# Patient Record
Sex: Male | Born: 1980 | Race: White | Hispanic: No | Marital: Single
Health system: Southern US, Community
[De-identification: ages and names within clinical notes are randomized; demographics above are authoritative.]

## PROBLEM LIST (undated history)

## (undated) DIAGNOSIS — E54 Ascorbic acid deficiency: Secondary | ICD-10-CM

## (undated) DIAGNOSIS — E039 Hypothyroidism, unspecified: Secondary | ICD-10-CM

## (undated) DIAGNOSIS — M6281 Muscle weakness (generalized): Secondary | ICD-10-CM

## (undated) DIAGNOSIS — I272 Pulmonary hypertension, unspecified: Secondary | ICD-10-CM

## (undated) DIAGNOSIS — F411 Generalized anxiety disorder: Secondary | ICD-10-CM

## (undated) DIAGNOSIS — E669 Obesity, unspecified: Secondary | ICD-10-CM

## (undated) DIAGNOSIS — D649 Anemia, unspecified: Secondary | ICD-10-CM

## (undated) DIAGNOSIS — G4730 Sleep apnea, unspecified: Secondary | ICD-10-CM

## (undated) DIAGNOSIS — F32A Depression, unspecified: Secondary | ICD-10-CM

## (undated) DIAGNOSIS — Z992 Dependence on renal dialysis: Secondary | ICD-10-CM

## (undated) DIAGNOSIS — I10 Essential (primary) hypertension: Secondary | ICD-10-CM

## (undated) DIAGNOSIS — I5030 Unspecified diastolic (congestive) heart failure: Secondary | ICD-10-CM

## (undated) DIAGNOSIS — J9691 Respiratory failure, unspecified with hypoxia: Secondary | ICD-10-CM

## (undated) DIAGNOSIS — J90 Pleural effusion, not elsewhere classified: Secondary | ICD-10-CM

## (undated) DIAGNOSIS — I890 Lymphedema, not elsewhere classified: Secondary | ICD-10-CM

## (undated) DIAGNOSIS — N049 Nephrotic syndrome with unspecified morphologic changes: Secondary | ICD-10-CM

## (undated) DIAGNOSIS — F329 Major depressive disorder, single episode, unspecified: Secondary | ICD-10-CM

## (undated) DIAGNOSIS — N189 Chronic kidney disease, unspecified: Secondary | ICD-10-CM

## (undated) DIAGNOSIS — E785 Hyperlipidemia, unspecified: Secondary | ICD-10-CM

## (undated) HISTORY — PX: UPPER GI ENDOSCOPY: SHX6162

## (undated) HISTORY — PX: CARDIAC CATHETERIZATION: SHX172

---

## 2014-06-04 DIAGNOSIS — R600 Localized edema: Secondary | ICD-10-CM | POA: Insufficient documentation

## 2017-03-30 ENCOUNTER — Emergency Department: Payer: Medicaid Other

## 2017-03-30 ENCOUNTER — Inpatient Hospital Stay
Admission: EM | Admit: 2017-03-30 | Discharge: 2017-04-16 | DRG: 673 | Disposition: A | Discharge status: Other Acute Inpatient Hospital | Payer: Medicaid Other | Attending: Internal Medicine | Admitting: Internal Medicine

## 2017-03-30 ENCOUNTER — Inpatient Hospital Stay: Payer: Medicaid Other

## 2017-03-30 ENCOUNTER — Encounter: Payer: Self-pay | Admitting: Intensive Care

## 2017-03-30 DIAGNOSIS — Z993 Dependence on wheelchair: Secondary | ICD-10-CM

## 2017-03-30 DIAGNOSIS — N2581 Secondary hyperparathyroidism of renal origin: Secondary | ICD-10-CM | POA: Diagnosis present

## 2017-03-30 DIAGNOSIS — Z7982 Long term (current) use of aspirin: Secondary | ICD-10-CM

## 2017-03-30 DIAGNOSIS — J9600 Acute respiratory failure, unspecified whether with hypoxia or hypercapnia: Secondary | ICD-10-CM

## 2017-03-30 DIAGNOSIS — R531 Weakness: Secondary | ICD-10-CM | POA: Diagnosis not present

## 2017-03-30 DIAGNOSIS — R319 Hematuria, unspecified: Secondary | ICD-10-CM | POA: Diagnosis present

## 2017-03-30 DIAGNOSIS — F329 Major depressive disorder, single episode, unspecified: Secondary | ICD-10-CM | POA: Diagnosis present

## 2017-03-30 DIAGNOSIS — J939 Pneumothorax, unspecified: Secondary | ICD-10-CM

## 2017-03-30 DIAGNOSIS — R609 Edema, unspecified: Secondary | ICD-10-CM | POA: Diagnosis present

## 2017-03-30 DIAGNOSIS — R140 Abdominal distension (gaseous): Secondary | ICD-10-CM

## 2017-03-30 DIAGNOSIS — D631 Anemia in chronic kidney disease: Secondary | ICD-10-CM | POA: Diagnosis present

## 2017-03-30 DIAGNOSIS — I890 Lymphedema, not elsewhere classified: Secondary | ICD-10-CM | POA: Diagnosis present

## 2017-03-30 DIAGNOSIS — Z7401 Bed confinement status: Secondary | ICD-10-CM

## 2017-03-30 DIAGNOSIS — J9601 Acute respiratory failure with hypoxia: Secondary | ICD-10-CM

## 2017-03-30 DIAGNOSIS — N179 Acute kidney failure, unspecified: Principal | ICD-10-CM | POA: Diagnosis present

## 2017-03-30 DIAGNOSIS — E039 Hypothyroidism, unspecified: Secondary | ICD-10-CM | POA: Diagnosis present

## 2017-03-30 DIAGNOSIS — I2510 Atherosclerotic heart disease of native coronary artery without angina pectoris: Secondary | ICD-10-CM | POA: Diagnosis present

## 2017-03-30 DIAGNOSIS — R059 Cough, unspecified: Secondary | ICD-10-CM

## 2017-03-30 DIAGNOSIS — Z6841 Body Mass Index (BMI) 40.0 and over, adult: Secondary | ICD-10-CM

## 2017-03-30 DIAGNOSIS — R809 Proteinuria, unspecified: Secondary | ICD-10-CM

## 2017-03-30 DIAGNOSIS — I5043 Acute on chronic combined systolic (congestive) and diastolic (congestive) heart failure: Secondary | ICD-10-CM | POA: Diagnosis present

## 2017-03-30 DIAGNOSIS — R0602 Shortness of breath: Secondary | ICD-10-CM | POA: Diagnosis not present

## 2017-03-30 DIAGNOSIS — J9690 Respiratory failure, unspecified, unspecified whether with hypoxia or hypercapnia: Secondary | ICD-10-CM

## 2017-03-30 DIAGNOSIS — J9622 Acute and chronic respiratory failure with hypercapnia: Secondary | ICD-10-CM | POA: Diagnosis present

## 2017-03-30 DIAGNOSIS — R339 Retention of urine, unspecified: Secondary | ICD-10-CM

## 2017-03-30 DIAGNOSIS — Z8249 Family history of ischemic heart disease and other diseases of the circulatory system: Secondary | ICD-10-CM

## 2017-03-30 DIAGNOSIS — L89220 Pressure ulcer of left hip, unstageable: Secondary | ICD-10-CM | POA: Diagnosis present

## 2017-03-30 DIAGNOSIS — N186 End stage renal disease: Secondary | ICD-10-CM | POA: Diagnosis present

## 2017-03-30 DIAGNOSIS — Z9889 Other specified postprocedural states: Secondary | ICD-10-CM

## 2017-03-30 DIAGNOSIS — N049 Nephrotic syndrome with unspecified morphologic changes: Secondary | ICD-10-CM | POA: Diagnosis present

## 2017-03-30 DIAGNOSIS — E872 Acidosis: Secondary | ICD-10-CM | POA: Diagnosis present

## 2017-03-30 DIAGNOSIS — Z79899 Other long term (current) drug therapy: Secondary | ICD-10-CM

## 2017-03-30 DIAGNOSIS — N1330 Unspecified hydronephrosis: Secondary | ICD-10-CM

## 2017-03-30 DIAGNOSIS — Z992 Dependence on renal dialysis: Secondary | ICD-10-CM

## 2017-03-30 DIAGNOSIS — R0902 Hypoxemia: Secondary | ICD-10-CM

## 2017-03-30 DIAGNOSIS — F419 Anxiety disorder, unspecified: Secondary | ICD-10-CM | POA: Diagnosis present

## 2017-03-30 DIAGNOSIS — J95811 Postprocedural pneumothorax: Secondary | ICD-10-CM

## 2017-03-30 DIAGNOSIS — J90 Pleural effusion, not elsewhere classified: Secondary | ICD-10-CM

## 2017-03-30 DIAGNOSIS — R600 Localized edema: Secondary | ICD-10-CM | POA: Diagnosis not present

## 2017-03-30 DIAGNOSIS — Z9981 Dependence on supplemental oxygen: Secondary | ICD-10-CM

## 2017-03-30 DIAGNOSIS — M6250 Muscle wasting and atrophy, not elsewhere classified, unspecified site: Secondary | ICD-10-CM | POA: Diagnosis present

## 2017-03-30 DIAGNOSIS — F411 Generalized anxiety disorder: Secondary | ICD-10-CM | POA: Diagnosis present

## 2017-03-30 DIAGNOSIS — I270 Primary pulmonary hypertension: Secondary | ICD-10-CM | POA: Diagnosis present

## 2017-03-30 DIAGNOSIS — E785 Hyperlipidemia, unspecified: Secondary | ICD-10-CM | POA: Diagnosis present

## 2017-03-30 DIAGNOSIS — L89223 Pressure ulcer of left hip, stage 3: Secondary | ICD-10-CM

## 2017-03-30 DIAGNOSIS — E662 Morbid (severe) obesity with alveolar hypoventilation: Secondary | ICD-10-CM | POA: Diagnosis present

## 2017-03-30 DIAGNOSIS — R05 Cough: Secondary | ICD-10-CM

## 2017-03-30 DIAGNOSIS — Z22322 Carrier or suspected carrier of Methicillin resistant Staphylococcus aureus: Secondary | ICD-10-CM

## 2017-03-30 DIAGNOSIS — R188 Other ascites: Secondary | ICD-10-CM | POA: Diagnosis present

## 2017-03-30 DIAGNOSIS — I132 Hypertensive heart and chronic kidney disease with heart failure and with stage 5 chronic kidney disease, or end stage renal disease: Secondary | ICD-10-CM | POA: Diagnosis present

## 2017-03-30 DIAGNOSIS — Z7722 Contact with and (suspected) exposure to environmental tobacco smoke (acute) (chronic): Secondary | ICD-10-CM | POA: Diagnosis present

## 2017-03-30 DIAGNOSIS — R0600 Dyspnea, unspecified: Secondary | ICD-10-CM

## 2017-03-30 DIAGNOSIS — I82511 Chronic embolism and thrombosis of right femoral vein: Secondary | ICD-10-CM | POA: Diagnosis present

## 2017-03-30 DIAGNOSIS — Z79891 Long term (current) use of opiate analgesic: Secondary | ICD-10-CM

## 2017-03-30 DIAGNOSIS — J9621 Acute and chronic respiratory failure with hypoxia: Secondary | ICD-10-CM

## 2017-03-30 DIAGNOSIS — Z7901 Long term (current) use of anticoagulants: Secondary | ICD-10-CM

## 2017-03-30 DIAGNOSIS — I10 Essential (primary) hypertension: Secondary | ICD-10-CM | POA: Diagnosis not present

## 2017-03-30 DIAGNOSIS — R81 Glycosuria: Secondary | ICD-10-CM | POA: Diagnosis present

## 2017-03-30 DIAGNOSIS — N189 Chronic kidney disease, unspecified: Secondary | ICD-10-CM | POA: Diagnosis not present

## 2017-03-30 HISTORY — DX: Hypothyroidism, unspecified: E03.9

## 2017-03-30 HISTORY — DX: Obesity, unspecified: E66.9

## 2017-03-30 HISTORY — DX: Major depressive disorder, single episode, unspecified: F32.9

## 2017-03-30 HISTORY — DX: Generalized anxiety disorder: F41.1

## 2017-03-30 HISTORY — DX: Essential (primary) hypertension: I10

## 2017-03-30 HISTORY — DX: Chronic kidney disease, unspecified: N18.9

## 2017-03-30 HISTORY — DX: Pulmonary hypertension, unspecified: I27.20

## 2017-03-30 HISTORY — DX: Hyperlipidemia, unspecified: E78.5

## 2017-03-30 HISTORY — DX: Unspecified diastolic (congestive) heart failure: I50.30

## 2017-03-30 HISTORY — DX: Depression, unspecified: F32.A

## 2017-03-30 HISTORY — DX: Lymphedema, not elsewhere classified: I89.0

## 2017-03-30 LAB — COMPREHENSIVE METABOLIC PANEL
ALBUMIN: 1.7 g/dL — AB (ref 3.5–5.0)
ALK PHOS: 88 U/L (ref 38–126)
ALT: 15 U/L — ABNORMAL LOW (ref 17–63)
ANION GAP: 12 (ref 5–15)
AST: 13 U/L — ABNORMAL LOW (ref 15–41)
BILIRUBIN TOTAL: 0.5 mg/dL (ref 0.3–1.2)
BUN: 110 mg/dL — ABNORMAL HIGH (ref 6–20)
CALCIUM: 7.3 mg/dL — AB (ref 8.9–10.3)
CO2: 22 mmol/L (ref 22–32)
Chloride: 105 mmol/L (ref 101–111)
Creatinine, Ser: 6.19 mg/dL — ABNORMAL HIGH (ref 0.61–1.24)
GFR calc non Af Amer: 11 mL/min — ABNORMAL LOW (ref >60)
GFR, EST AFRICAN AMERICAN: 12 mL/min — AB (ref >60)
Glucose, Bld: 90 mg/dL (ref 65–99)
POTASSIUM: 4.5 mmol/L (ref 3.5–5.1)
SODIUM: 139 mmol/L (ref 135–145)
TOTAL PROTEIN: 7.4 g/dL (ref 6.5–8.1)

## 2017-03-30 LAB — PROTIME-INR
INR: 1.3
PROTHROMBIN TIME: 16.3 s — AB (ref 11.4–15.2)

## 2017-03-30 LAB — CBC WITH DIFFERENTIAL/PLATELET
BASOS PCT: 3 %
Basophils Absolute: 0.3 10*3/uL — ABNORMAL HIGH (ref 0–0.1)
EOS ABS: 0.3 10*3/uL (ref 0–0.7)
Eosinophils Relative: 2 %
HEMATOCRIT: 28.3 % — AB (ref 40.0–52.0)
HEMOGLOBIN: 9 g/dL — AB (ref 13.0–18.0)
LYMPHS ABS: 1.1 10*3/uL (ref 1.0–3.6)
Lymphocytes Relative: 10 %
MCH: 26 pg (ref 26.0–34.0)
MCHC: 31.8 g/dL — AB (ref 32.0–36.0)
MCV: 81.7 fL (ref 80.0–100.0)
MONO ABS: 0.7 10*3/uL (ref 0.2–1.0)
MONOS PCT: 7 %
NEUTROS PCT: 78 %
Neutro Abs: 8.6 10*3/uL — ABNORMAL HIGH (ref 1.4–6.5)
Platelets: 537 10*3/uL — ABNORMAL HIGH (ref 150–440)
RBC: 3.46 MIL/uL — ABNORMAL LOW (ref 4.40–5.90)
RDW: 15.3 % — AB (ref 11.5–14.5)
WBC: 11 10*3/uL — ABNORMAL HIGH (ref 3.8–10.6)

## 2017-03-30 LAB — TSH: TSH: 12.39 u[IU]/mL — ABNORMAL HIGH (ref 0.350–4.500)

## 2017-03-30 LAB — MRSA PCR SCREENING: MRSA BY PCR: POSITIVE — AB

## 2017-03-30 LAB — APTT: aPTT: 47 seconds — ABNORMAL HIGH (ref 24–36)

## 2017-03-30 LAB — FIBRIN DERIVATIVES D-DIMER (ARMC ONLY): Fibrin derivatives D-dimer (ARMC): 6223.33 — ABNORMAL HIGH (ref 0.00–499.00)

## 2017-03-30 LAB — BRAIN NATRIURETIC PEPTIDE: B NATRIURETIC PEPTIDE 5: 61 pg/mL (ref 0.0–100.0)

## 2017-03-30 LAB — TROPONIN I: Troponin I: <0.03 ng/mL (ref <0.03)

## 2017-03-30 MED ORDER — BUPROPION HCL 100 MG PO TABS
100.0000 mg | ORAL_TABLET | Freq: Two times a day (BID) | ORAL | Status: DC
  Administered 2017-03-30 – 2017-04-16 (×30): 100 mg via ORAL
  Filled 2017-03-30 (×36): qty 1

## 2017-03-30 MED ORDER — MUPIROCIN 2 % EX OINT
1.0000 "application " | TOPICAL_OINTMENT | Freq: Two times a day (BID) | CUTANEOUS | Status: AC
  Administered 2017-03-30 – 2017-04-04 (×9): 1 via NASAL
  Filled 2017-03-30: qty 22

## 2017-03-30 MED ORDER — CLONIDINE HCL 0.1 MG PO TABS
0.2000 mg | ORAL_TABLET | Freq: Two times a day (BID) | ORAL | Status: DC
  Administered 2017-03-30 – 2017-04-02 (×7): 0.2 mg via ORAL
  Filled 2017-03-30 (×7): qty 2

## 2017-03-30 MED ORDER — HYDRALAZINE HCL 20 MG/ML IJ SOLN
INTRAMUSCULAR | Status: AC
  Filled 2017-03-30: qty 1

## 2017-03-30 MED ORDER — VANCOMYCIN HCL IN DEXTROSE 1-5 GM/200ML-% IV SOLN
1000.0000 mg | INTRAVENOUS | Status: DC
  Administered 2017-03-31 – 2017-04-03 (×4): 1000 mg via INTRAVENOUS
  Filled 2017-03-30 (×4): qty 200

## 2017-03-30 MED ORDER — LEVOTHYROXINE SODIUM 50 MCG PO TABS
75.0000 ug | ORAL_TABLET | Freq: Every day | ORAL | Status: DC
  Administered 2017-03-31 – 2017-04-16 (×16): 75 ug via ORAL
  Filled 2017-03-30 (×16): qty 2

## 2017-03-30 MED ORDER — ONDANSETRON HCL 4 MG/2ML IJ SOLN
4.0000 mg | Freq: Four times a day (QID) | INTRAMUSCULAR | Status: DC | PRN
  Administered 2017-04-03: 4 mg via INTRAVENOUS
  Filled 2017-03-30: qty 2

## 2017-03-30 MED ORDER — HYDRALAZINE HCL 50 MG PO TABS
50.0000 mg | ORAL_TABLET | Freq: Three times a day (TID) | ORAL | Status: DC
  Administered 2017-03-30 – 2017-04-02 (×8): 50 mg via ORAL
  Filled 2017-03-30 (×8): qty 1

## 2017-03-30 MED ORDER — ONDANSETRON HCL 4 MG PO TABS: 4.0000 mg | ORAL_TABLET | Freq: Four times a day (QID) | ORAL | Status: DC | PRN

## 2017-03-30 MED ORDER — ATORVASTATIN CALCIUM 20 MG PO TABS
20.0000 mg | ORAL_TABLET | Freq: Every day | ORAL | Status: DC
  Administered 2017-03-30 – 2017-04-05 (×7): 20 mg via ORAL
  Filled 2017-03-30 (×7): qty 1

## 2017-03-30 MED ORDER — DEXTROSE 5 % IV SOLN
2.0000 g | Freq: Once | INTRAVENOUS | Status: AC
  Administered 2017-03-30: 2 g via INTRAVENOUS
  Filled 2017-03-30: qty 2

## 2017-03-30 MED ORDER — ORAL CARE MOUTH RINSE
15.0000 mL | Freq: Two times a day (BID) | OROMUCOSAL | Status: DC
  Administered 2017-03-30 – 2017-04-03 (×7): 15 mL via OROMUCOSAL

## 2017-03-30 MED ORDER — HYDRALAZINE HCL 20 MG/ML IJ SOLN
10.0000 mg | Freq: Four times a day (QID) | INTRAMUSCULAR | Status: DC | PRN
  Administered 2017-03-30 – 2017-04-08 (×3): 10 mg via INTRAVENOUS
  Filled 2017-03-30 (×3): qty 1

## 2017-03-30 MED ORDER — VANCOMYCIN HCL IN DEXTROSE 1-5 GM/200ML-% IV SOLN
1000.0000 mg | Freq: Once | INTRAVENOUS | Status: AC
  Administered 2017-03-30: 1000 mg via INTRAVENOUS
  Filled 2017-03-30: qty 200

## 2017-03-30 MED ORDER — DEXTROSE 5 % IV SOLN
1.0000 g | INTRAVENOUS | Status: AC
  Administered 2017-03-31 – 2017-04-07 (×8): 1 g via INTRAVENOUS
  Filled 2017-03-30 (×9): qty 1

## 2017-03-30 MED ORDER — ACETAMINOPHEN 325 MG PO TABS: 650.0000 mg | ORAL_TABLET | Freq: Four times a day (QID) | ORAL | Status: DC | PRN

## 2017-03-30 MED ORDER — TRAMADOL HCL 50 MG PO TABS
50.0000 mg | ORAL_TABLET | Freq: Four times a day (QID) | ORAL | Status: DC | PRN
  Administered 2017-03-31 – 2017-04-06 (×15): 50 mg via ORAL
  Filled 2017-03-30 (×16): qty 1

## 2017-03-30 MED ORDER — CHLORHEXIDINE GLUCONATE CLOTH 2 % EX PADS
6.0000 | MEDICATED_PAD | Freq: Every day | CUTANEOUS | Status: AC
  Administered 2017-03-31 – 2017-04-04 (×5): 6 via TOPICAL

## 2017-03-30 MED ORDER — DOCUSATE SODIUM 100 MG PO CAPS
100.0000 mg | ORAL_CAPSULE | Freq: Two times a day (BID) | ORAL | Status: DC
  Administered 2017-03-30 – 2017-04-11 (×4): 100 mg via ORAL
  Filled 2017-03-30 (×23): qty 1

## 2017-03-30 MED ORDER — APIXABAN 5 MG PO TABS
5.0000 mg | ORAL_TABLET | Freq: Two times a day (BID) | ORAL | Status: DC
  Administered 2017-03-30 – 2017-04-06 (×13): 5 mg via ORAL
  Filled 2017-03-30 (×14): qty 1

## 2017-03-30 MED ORDER — AMLODIPINE BESYLATE 10 MG PO TABS
10.0000 mg | ORAL_TABLET | Freq: Every day | ORAL | Status: DC
  Administered 2017-03-31 – 2017-04-02 (×3): 10 mg via ORAL
  Filled 2017-03-30 (×3): qty 1

## 2017-03-30 MED ORDER — ACETAMINOPHEN 650 MG RE SUPP: 650.0000 mg | Freq: Four times a day (QID) | RECTAL | Status: DC | PRN

## 2017-03-30 MED ORDER — BISOPROLOL FUMARATE 5 MG PO TABS
10.0000 mg | ORAL_TABLET | Freq: Every day | ORAL | Status: DC
Start: 2017-03-31 — End: 2017-04-17
  Administered 2017-03-31 – 2017-04-16 (×16): 10 mg via ORAL
  Filled 2017-03-30: qty 2
  Filled 2017-03-30 (×5): qty 1
  Filled 2017-03-30 (×2): qty 2
  Filled 2017-03-30 (×2): qty 1
  Filled 2017-03-30: qty 2
  Filled 2017-03-30 (×4): qty 1
  Filled 2017-03-30 (×2): qty 2

## 2017-03-30 NOTE — H&P (Signed)
[employer] [facility] [place] [facility]   PATIENT NAME: [name]    MR#:  [id number]  DATE OF BIRTH:  November 13, 1980  DATE OF ADMISSION:  03/30/2017  PRIMARY CARE PHYSICIAN: Patient, No Pcp Per   REQUESTING/REFERRING PHYSICIAN: Dr. Dorothea Glassman  CHIEF COMPLAINT:   Chief Complaint  Patient presents with  . Acute Renal Failure  . Shortness of Breath    HISTORY OF PRESENT ILLNESS:  [name]  is a 36 y.o. male with a known history of Major depression, diastolic congestive heart failure, systemic hypertension, primary pulmonary hypertension, hypothyroidism, bilateral lymphedema, obesity, CK D stage III, anxiety disorder presents from [facility] secondary to dyspnea and acute renal failure. Patient has been living at [facility] for almost 4 months now. He is mostly wheelchair bound and also uses a walker. His ambulation has been significantly decreased in the last 2 months. He has had bilateral lymphedema for several years now. Had a right heart catheterization at [facility] in 2016 showing primary pulmonary hypertension and diastolic CHF. He has been on Lasix and metolazone. He says his urine output has been normal. He has trouble with hesitancy, but describes that he has been urinating decent amounts. Denies any dark urine or bloody urine. His labs done at [facility] care 2 weeks ago showed that his creatinine was at 6, baseline creatinine was noted to be around 1.5 at [facility] from 2 years ago. His diuretics were held at the time and repeat labs done again shows that the creatinine is again at 6. No new medications. His Lasix and metolazone were held for the past week labs. However since no improvement was noted, patient is sent over. Chest x-ray shows left pleural effusion.  PAST MEDICAL HISTORY:   Past Medical History:  Diagnosis Date  . CKD (chronic kidney disease)   . Depression   . Diastolic CHF (HCC)   . Generalized anxiety disorder   .  Hyperlipidemia   . Hypertension   . Hypertension   . Hypothyroidism   . Lymphedema   . Obesity   . Pulmonary hypertension (HCC)     PAST SURGICAL HISTORY:   Past Surgical History:  Procedure Laterality Date  . CARDIAC CATHETERIZATION      SOCIAL HISTORY:   Social History  Substance Use Topics  . Smoking status: Passive Smoke Exposure - Never Smoker    Types: Cigarettes  . Smokeless tobacco: Never Used  . Alcohol use No    FAMILY HISTORY:   Family History  Problem Relation Age of Onset  . Hypertension Mother     DRUG ALLERGIES:  No Known Allergies  REVIEW OF SYSTEMS:   Review of Systems  Constitutional: Positive for malaise/fatigue. Negative for chills, fever and weight loss.  HENT: Negative for ear discharge, ear pain, hearing loss and nosebleeds.   Eyes: Negative for blurred vision, double vision and photophobia.  Respiratory: Positive for shortness of breath. Negative for cough, hemoptysis and wheezing.   Cardiovascular: Positive for orthopnea and leg swelling. Negative for chest pain and palpitations.  Gastrointestinal: Negative for abdominal pain, constipation, diarrhea, heartburn, melena, nausea and vomiting.  Genitourinary: Negative for dysuria, frequency and urgency.       Hesitancy  Musculoskeletal: Positive for joint pain and myalgias. Negative for back pain and neck pain.  Skin: Negative for rash.  Neurological: Negative for dizziness, tingling, sensory change, speech change, focal weakness and headaches.  Endo/Heme/Allergies: Does not bruise/bleed easily.  Psychiatric/Behavioral: Negative for depression.  MEDICATIONS AT HOME:   Prior to Admission medications   Medication Sig Start Date End Date Taking? Authorizing Provider  amLODipine (NORVASC) 10 MG tablet Take 10 mg by mouth daily.   Yes [provider]  amLODipine (NORVASC) 5 MG tablet Take 5 mg by mouth daily.   Yes [provider]  apixaban (ELIQUIS) 5 MG TABS tablet Take  5 mg by mouth 2 (two) times daily.   Yes [provider]  aspirin 325 MG EC tablet Take 325 mg by mouth daily.   Yes [provider]  atorvastatin (LIPITOR) 20 MG tablet Take 20 mg by mouth at bedtime.   Yes [provider]  bisoprolol (ZEBETA) 10 MG tablet Take 10 mg by mouth daily.   Yes [provider]  buPROPion (WELLBUTRIN) 100 MG tablet Take 100 mg by mouth 2 (two) times daily.   Yes [provider]  cloNIDine (CATAPRES) 0.2 MG tablet Take 0.2 mg by mouth 2 (two) times daily.   Yes [provider]  collagenase (SANTYL) ointment Apply 1 application topically daily.   Yes [provider]  hydrALAZINE (APRESOLINE) 50 MG tablet Take 50 mg by mouth 3 (three) times daily.   Yes [provider]  levothyroxine (SYNTHROID, LEVOTHROID) 75 MCG tablet Take 75 mcg by mouth daily before breakfast.   Yes [provider]  metolazone (ZAROXOLYN) 2.5 MG tablet Take 2.5 mg by mouth every other day.   Yes [provider]  nystatin (MYCOSTATIN/NYSTOP) powder Apply topically 2 (two) times daily.   Yes [provider]  potassium chloride SA (K-DUR,KLOR-CON) 20 MEQ tablet Take 20 mEq by mouth every other day.   Yes [provider]  ondansetron (ZOFRAN) 8 MG tablet Take by mouth every 8 (eight) hours as needed for nausea or vomiting.    [provider]  traMADol (ULTRAM) 50 MG tablet Take 50 mg by mouth every 8 (eight) hours as needed.    [provider]      VITAL SIGNS:  Blood pressure (!) 176/106, pulse (!) 115, temperature 97.9 F (36.6 C), temperature source Oral, resp. rate (!) 22, height 5\' 9"  (1.753 m), weight (!) 145.2 kg (320 lb), SpO2 100 %.  PHYSICAL EXAMINATION:   Physical Exam  GENERAL:  36 y.o.-year-old obese patient lying in the bed with no acute distress. Appears tachypneic EYES: Pupils equal, round, reactive to light and accommodation. No scleral icterus. Extraocular  muscles intact.  HEENT: Head atraumatic, normocephalic. Oropharynx and nasopharynx clear.  NECK:  Supple, no jugular venous distention. No thyroid enlargement, no tenderness.  LUNGS: Normal breath sounds bilaterally, no wheezing, rales,rhonchi or crepitation. Decreased left basilar breath sounds. Using accessory muscles for breathing on minimal exertion. CARDIOVASCULAR: S1, S2 normal. No  rubs, or gallops. 3/6 systolic murmur present. ABDOMEN: Extensive abdominal wall edema noted, more localized towards both flanks. Soft, nontender, nondistended. Bowel sounds present. No organomegaly or mass.  EXTREMITIES:  Has 4+ edema of both thighs with scrotal edema noted. The calf edema is 1+. No pedal edema noted. On anterior surface of left thigh there's a open wound with slough gangrenous in the middle with purulent discharge surrounding it. NEUROLOGIC: Cranial nerves II through XII are intact. Muscle strength 5/5 in all extremities. Sensation intact. Gait not checked.  PSYCHIATRIC: The patient is alert and oriented x 3.  SKIN: Open wound on left anterior thigh.   LABORATORY PANEL:   CBC  Recent Labs Lab 03/30/17 1749  WBC 11.0*  HGB 9.0*  HCT 28.3*  PLT 537*   ------------------------------------------------------------------------------------------------------------------  Chemistries   Recent Labs Lab 03/30/17 1749  NA 139  K 4.5  CL 105  CO2 22  GLUCOSE 90  BUN 110*  CREATININE 6.19*  CALCIUM 7.3*  AST 13*  ALT 15*  ALKPHOS 88  BILITOT 0.5   ------------------------------------------------------------------------------------------------------------------  Cardiac Enzymes  Recent Labs Lab 03/30/17 1749  TROPONINI <0.03   ------------------------------------------------------------------------------------------------------------------  RADIOLOGY:  Dg Chest Portable 1 View  Result Date: 03/30/2017 CLINICAL DATA:  Shortness of breath and chest pain EXAM: PORTABLE  CHEST 1 VIEW COMPARISON:  None. FINDINGS: Mild cardiomegaly. Large left pleural effusion with components along the lateral left hemithorax. Associated atelectasis. Right lung is clear. IMPRESSION: Mild cardiomegaly and large left pleural effusion. Electronically Signed   By: Deatra Robinson M.D.   On: 03/30/2017 18:22    EKG:   Orders placed or performed during the hospital encounter of 03/30/17  . EKG 12-Lead  . EKG 12-Lead  . ED EKG  . ED EKG    IMPRESSION AND PLAN:   Meshal Breuninger  is a 36 y.o. male with a known history of Major depression, diastolic congestive heart failure, systemic hypertension, primary pulmonary hypertension, hypothyroidism, bilateral lymphedema, obesity, CK D stage III, anxiety disorder presents from [facility] secondary to dyspnea and acute renal failure.  #1 acute renal failure on CKD- Baseline creatinine from 2 years ago seems to be around 1.5, labs done 2 weeks ago with creatinine of 6 and hasn't changed in the last 2 weeks. -Agree to hold diuretics. Patient denies any urinary retention. Strict input and output monitoring -Nephrology consulted. Renal ultrasound or CT to look at the kidneys. -Avoid nephrotoxins. -Potassium and electrolytes within normal limits. No acute indication for hemodialysis at this time  #2 left thigh wound- seems like a pressure injury resulting in open necrotic wound with sloughing. -Wound care consult. Wound cultures -Renally adjusted antibiotics with vancomycin and cefepime at this time  #3 left pleural effusion-known history of diastolic CHF and pulmonary hypertension. Diuretics have been held for the last week due to renal failure. -Due to acute dyspnea and increased oxygen requirements, ultrasound-guided tap requested. -Patient is on eliquis  #4 hypertension-continue home medications. On hydralazine, Norvasc, clonidine, bisoprolol  #5 hypothyroidism-continue Synthroid  #6 DVT prophylaxis-already on eliquis -Eliquis was  started in the first week of July according to patient, he thinks that they have improved Colace started because the ultrasound report was not clear. -Repeat Dopplers of lower extremities ordered.   Patient is from [employer]. Social worker consulted   All the records are reviewed and case discussed with ED provider. Management plans discussed with the patient, family and they are in agreement.  CODE STATUS: Full code  TOTAL TIME TAKING CARE OF THIS PATIENT: 60 minutes.    Enid Baas M.D on 03/30/2017 at 8:03 PM  Between 7am to 6pm - Pager - [phone]  After 6pm go to [email or web address] - password [email or web address]  [facility]  Office  [phone]  CC: Primary care physician; Patient, No Pcp Per

## 2017-03-30 NOTE — ED Notes (Signed)
Pt to go to CT prior to going to floor, floor informed

## 2017-03-30 NOTE — ED Provider Notes (Signed)
[facility] Emergency Department Provider Note   ____________________________________________   First MD Initiated Contact with Patient 03/30/17 1741     (approximate)  I have reviewed the triage vital signs and the nursing notes.   HISTORY  Chief Complaint Acute Renal Failure and Shortness of Breath   HPI [name] is a 36 y.o. male patient reports he is usually on 2 L of oxygen and has swelling in his legs but it got better his lower legs is still swollen and both thighs down. He reports yesterday he tried to move just a little bit in bed he usually not able to move much but he usually does not get short of breath yesterday he got very short of breath from moving just an inch. He is Short of breath again today. She had the bump his oxygen up. Said his doctor said he has some worsening renal failure and of course his shortness of breath. He was checked less than a month ago for blood clots and didn't find anything. Review of his records from [facility] show he takes lisinopril thyroid replacement atorvastatin and has a history of morbid obesity due to excess calories abdominal edema and lower extremity edema.  Past Medical History:  Diagnosis Date  . Hypertension     There are no active problems to display for this patient.   History reviewed. No pertinent surgical history.  Prior to Admission medications   Medication Sig Start Date End Date Taking? Authorizing Provider  amLODipine (NORVASC) 10 MG tablet Take 10 mg by mouth daily.   Yes [provider]  amLODipine (NORVASC) 5 MG tablet Take 5 mg by mouth daily.   Yes [provider]  apixaban (ELIQUIS) 5 MG TABS tablet Take 5 mg by mouth 2 (two) times daily.   Yes [provider]  aspirin 325 MG EC tablet Take 325 mg by mouth daily.   Yes [provider]  atorvastatin (LIPITOR) 20 MG tablet Take 20 mg by mouth at bedtime.   Yes [provider]  bisoprolol (ZEBETA) 10 MG tablet Take 10 mg by mouth daily.   Yes [provider]  buPROPion (WELLBUTRIN) 100 MG tablet Take 100 mg by mouth 2 (two) times daily.   Yes [provider]  cloNIDine (CATAPRES) 0.2 MG tablet Take 0.2 mg by mouth 2 (two) times daily.   Yes [provider]    Allergies Patient has no known allergies.  History reviewed. No pertinent family history.  Social History Social History  Substance Use Topics  . Smoking status: Passive Smoke Exposure - Never Smoker    Types: Cigarettes  . Smokeless tobacco: Never Used  . Alcohol use No    Review of Systems  Constitutional: No fever/chills Eyes: No visual changes. ENT: No sore throat. Cardiovascular: Denies chest pain. Respiratory:  shortness of breath. Gastrointestinal: No abdominal pain.  No nausea, no vomiting.  No diarrhea.  No constipation. Genitourinary: Negative for dysuria. Musculoskeletal: Negative for back pain. Skin: Healing rash. Neurological: Negative for headaches, focal weakness or numbness.  ____________________________________________   PHYSICAL EXAM:  VITAL SIGNS: ED Triage Vitals  Enc Vitals Group     BP 03/30/17 1734 (!) 172/117     Pulse Rate 03/30/17 1734 (!) 113     Resp 03/30/17 1734 20     Temp 03/30/17 1734 97.9 F (36.6 C)     Temp Source 03/30/17 1734 Oral     SpO2 03/30/17 1734 97 %  Weight 03/30/17 1736 (!) 320 lb (145.2 kg)     Height 03/30/17 1736 5\' 9"  (1.753 m)     Head Circumference --      Peak Flow --      Pain Score 03/30/17 1734 5     Pain Loc --      Pain Edu? --      Excl. in GC? --     Constitutional: Alert and oriented. Pale appearing and in no acute distress. Eyes: Conjunctivae are pale  Head: Atraumatic. Nose: No congestion/rhinnorhea. Mouth/Throat: Mucous membranes are moist.  Oropharynx non-erythematous. Neck: No stridor.  Cardiovascular: Rapid rate, regular rhythm. Grossly normal heart sounds.   Good peripheral circulation. Respiratory: Normal respiratory effort.  No retractions. Lungs CTAB. Gastrointestinal: Soft and nontender. Firm and large him not sure if this is due to edema or obesity.. No abdominal bruits. No CVA tenderness. Musculoskeletal: Calves bilaterally show evidence of recent losses fluid volume. There are no edema there now. His thighs however are tense and firm there is no pitting edema however Neurologic:  Normal speech and language. No gross focal neurologic deficits are appreciated. No gait instability. Skin:  Skin is warm, there are some clear blisters on the thighs. Psychiatric: Mood and affect are normal. Speech and behavior are normal.  ____________________________________________   LABS (all labs ordered are listed, but only abnormal results are displayed)  Labs Reviewed  CBC WITH DIFFERENTIAL/PLATELET - Abnormal; Notable for the following:       Result Value   WBC 11.0 (*)    RBC 3.46 (*)    Hemoglobin 9.0 (*)    HCT 28.3 (*)    MCHC 31.8 (*)    RDW 15.3 (*)    Platelets 537 (*)    Neutro Abs 8.6 (*)    Basophils Absolute 0.3 (*)    All other components within normal limits  COMPREHENSIVE METABOLIC PANEL - Abnormal; Notable for the following:    BUN 110 (*)    Creatinine, Ser 6.19 (*)    Calcium 7.3 (*)    Albumin 1.7 (*)    AST 13 (*)    ALT 15 (*)    GFR calc non Af Amer 11 (*)    GFR calc Af Amer 12 (*)    All other components within normal limits  FIBRIN DERIVATIVES D-DIMER ([facility] ONLY) - Abnormal; Notable for the following:    Fibrin derivatives D-dimer [facility]) 6,223.33 (*)    All other components within normal limits  TROPONIN I  BRAIN NATRIURETIC PEPTIDE  PROTIME-INR  APTT  TSH   ____________________________________________  EKG  EKG read and interpreted by me shows sinus tachycardia rate of 112 normal axis I PVC low voltage in the chest leads no obvious acute  changes ____________________________________________  RADIOLOGY IMPRESSION: Mild cardiomegaly and large left pleural effusion.   Electronically Signed   By: Deatra Robinson M.D.   On: 03/30/2017 18:22 ____________________________________________   PROCEDURES  Procedure(s) performed  Procedures  Critical Care performed:   ____________________________________________   INITIAL IMPRESSION / ASSESSMENT AND PLAN / ED COURSE  Pertinent labs & imaging results that were available during my care of the patient were reviewed by me and considered in my medical decision making (see chart for details).        ____________________________________________   FINAL CLINICAL IMPRESSION(S) / ED DIAGNOSES  Final diagnoses:  AKI (acute kidney injury) (HCC)  Dyspnea, unspecified type      NEW MEDICATIONS STARTED DURING THIS VISIT:  New Prescriptions  No medications on file     Note:  This document was prepared using Dragon voice recognition software and may include unintentional dictation errors.    Arnaldo Natal, MD 03/30/17 [id number]

## 2017-03-30 NOTE — ED Triage Notes (Signed)
Patient arrived from [facility] for increased SOB today and renal failure. Wears 2L O2 continuously and had to increase to 3L today. A&O x4. Multiple skin wounds on L upper thigh and side

## 2017-03-30 NOTE — ED Notes (Signed)
Per Dr. Nemiah Commander, okay for pt to go to floor after hydralizine given

## 2017-03-30 NOTE — ED Notes (Signed)
Floor requesting PRN medication for DBP, hospitalist notified

## 2017-03-30 NOTE — Progress Notes (Addendum)
Pharmacy Antibiotic Note  [name] is a 36 y.o. male admitted on 03/30/2017 with cellulitis and wound infection.  Pharmacy has been consulted for vancomycin and cefepime dosing.  Plan: Vancomycin 1000 IV every 24 hours.  Goal trough 10-15 mcg/mL.  Cefepime 1gm iv q24h   Vancomycin trough 8/20@0230  before 4th dose  Height: 5\' 9"  (175.3 cm) Weight: (!) 320 lb (145.2 kg) IBW/kg (Calculated) : 70.7  Temp (24hrs), Avg:97.9 F (36.6 C), Min:97.9 F (36.6 C), Max:97.9 F (36.6 C)   Recent Labs Lab 03/30/17 1749  WBC 11.0*  CREATININE 6.19*    Estimated Creatinine Clearance: 23.9 mL/min (A) (by C-G formula based on SCr of 6.19 mg/dL (H)).    No Known Allergies  Antimicrobials this admission: 8/17 vancomycin >>  8/17 cefepime >>    Microbiology results: 8/17 Would culture: pending     Thank you for allowing pharmacy to be a part of this patient's care.  [name] 03/30/2017 7:51 PM

## 2017-03-31 ENCOUNTER — Inpatient Hospital Stay: Payer: Medicaid Other

## 2017-03-31 ENCOUNTER — Inpatient Hospital Stay
Admit: 2017-03-31 | Discharge: 2017-03-31 | Disposition: A | Discharge status: Home / Self Care | Payer: Medicaid Other | Attending: Internal Medicine | Admitting: Internal Medicine

## 2017-03-31 DIAGNOSIS — L89220 Pressure ulcer of left hip, unstageable: Secondary | ICD-10-CM

## 2017-03-31 DIAGNOSIS — L89223 Pressure ulcer of left hip, stage 3: Secondary | ICD-10-CM

## 2017-03-31 LAB — BASIC METABOLIC PANEL
Anion gap: 12 (ref 5–15)
BUN: 102 mg/dL — AB (ref 6–20)
CALCIUM: 7.1 mg/dL — AB (ref 8.9–10.3)
CHLORIDE: 107 mmol/L (ref 101–111)
CO2: 23 mmol/L (ref 22–32)
CREATININE: 6.68 mg/dL — AB (ref 0.61–1.24)
GFR calc non Af Amer: 10 mL/min — ABNORMAL LOW (ref >60)
GFR, EST AFRICAN AMERICAN: 11 mL/min — AB (ref >60)
GLUCOSE: 94 mg/dL (ref 65–99)
Potassium: 4.6 mmol/L (ref 3.5–5.1)
Sodium: 142 mmol/L (ref 135–145)

## 2017-03-31 LAB — URINALYSIS, ROUTINE W REFLEX MICROSCOPIC
Bilirubin Urine: NEGATIVE
Glucose, UA: 150 mg/dL — AB
KETONES UR: NEGATIVE mg/dL
Leukocytes, UA: NEGATIVE
Nitrite: NEGATIVE
Specific Gravity, Urine: 1.011 (ref 1.005–1.030)
pH: 5 (ref 5.0–8.0)

## 2017-03-31 LAB — CBC
HCT: 26.2 % — ABNORMAL LOW (ref 40.0–52.0)
Hemoglobin: 8.9 g/dL — ABNORMAL LOW (ref 13.0–18.0)
MCH: 27.8 pg (ref 26.0–34.0)
MCHC: 33.8 g/dL (ref 32.0–36.0)
MCV: 82.3 fL (ref 80.0–100.0)
PLATELETS: 461 10*3/uL — AB (ref 150–440)
RBC: 3.19 MIL/uL — AB (ref 4.40–5.90)
RDW: 15.2 % — AB (ref 11.5–14.5)
WBC: 11 10*3/uL — ABNORMAL HIGH (ref 3.8–10.6)

## 2017-03-31 LAB — ECHOCARDIOGRAM COMPLETE
HEIGHTINCHES: 69 in
WEIGHTICAEL: 5379.2 [oz_av]

## 2017-03-31 LAB — PROTEIN / CREATININE RATIO, URINE
CREATININE, URINE: 35 mg/dL
PROTEIN CREATININE RATIO: 17.11 mg/mg{creat} — AB (ref 0.00–0.15)
TOTAL PROTEIN, URINE: 599 mg/dL

## 2017-03-31 LAB — VANCOMYCIN, RANDOM: VANCOMYCIN RM: 18

## 2017-03-31 MED ORDER — FENTANYL CITRATE (PF) 100 MCG/2ML IJ SOLN
25.0000 ug | INTRAMUSCULAR | Status: DC | PRN
  Administered 2017-03-31: 25 ug via INTRAVENOUS

## 2017-03-31 MED ORDER — LIDOCAINE-EPINEPHRINE 1 %-1:100000 IJ SOLN
10.0000 mL | Freq: Once | INTRAMUSCULAR | Status: AC
  Administered 2017-03-31: 10 mL via INTRADERMAL
  Filled 2017-03-31: qty 10

## 2017-03-31 MED ORDER — FUROSEMIDE 10 MG/ML IJ SOLN
120.0000 mg | Freq: Once | INTRAMUSCULAR | Status: AC
  Administered 2017-03-31: 120 mg via INTRAVENOUS
  Filled 2017-03-31: qty 12

## 2017-03-31 MED ORDER — FENTANYL CITRATE (PF) 100 MCG/2ML IJ SOLN
INTRAMUSCULAR | Status: AC
  Administered 2017-03-31: 25 ug via INTRAVENOUS
  Filled 2017-03-31: qty 2

## 2017-03-31 NOTE — Progress Notes (Signed)
[facility] [place] [facility]   PATIENT NAME: [name]    MR#:  [id number]  DATE OF BIRTH:  10/29/1980  SUBJECTIVE:   patient here with SOB cannot lie flat   REVIEW OF SYSTEMS:    Review of Systems  Constitutional: Negative for fever, chills weight loss HENT: Negative for ear pain, nosebleeds, congestion, facial swelling, rhinorrhea, neck pain, neck stiffness and ear discharge.   Respiratory: Negative for cough, ++shortness of breath, nowheezing  Cardiovascular: Negative for chest pain, palpitations and leg swelling.  Gastrointestinal: Negative for heartburn, abdominal pain, vomiting, diarrhea or consitpation Genitourinary: Negative for dysuria, urgency, frequency, hematuria Musculoskeletal: Negative for back pain or joint pain Neurological: Negative for dizziness, seizures, syncope, focal weakness,  numbness and headaches.  Hematological: Does not bruise/bleed easily.  Psychiatric/Behavioral: Negative for hallucinations, confusion, dysphoric mood Skin: left thigh wound   Tolerating Diet: yes      DRUG ALLERGIES:  No Known Allergies  VITALS:  Blood pressure (!) 173/108, pulse (!) 124, temperature 97.8 F (36.6 C), temperature source Oral, resp. rate (!) 24, height 5\' 9"  (1.753 m), weight (!) 152.5 kg (336 lb 3.2 oz), SpO2 97 %.  PHYSICAL EXAMINATION:  Constitutional: Appears well-developed and well-nourished. NAD HENT: Normocephalic. [name] Oropharynx is clear and moist.  Eyes: Conjunctivae and EOM are normal. PERRLA, no scleral icterus.  Neck: Normal ROM. Neck supple. No JVD. No tracheal deviation. CVS: RRR, S1/S2 +, no murmurs, no gallops, no carotid bruit.  Pulmonary: Effort and breath sounds normal, no stridor, rhonchi, wheezes, rales.  Abdominal: abdominal wall edema scrotal edema noted BS + no rebound guarding Musculoskeletal: Normal range of motion. No edema and no tenderness.  Neuro: Alert. CN 2-12 grossly intact. No focal  deficits. Skin:left thoght open wound malordorous with discharge Psychiatric: Normal mood and affect.      LABORATORY PANEL:   CBC  Recent Labs Lab 03/31/17 0408  WBC 11.0*  HGB 8.9*  HCT 26.2*  PLT 461*   ------------------------------------------------------------------------------------------------------------------  Chemistries   Recent Labs Lab 03/30/17 1749 03/31/17 0408  NA 139 142  K 4.5 4.6  CL 105 107  CO2 22 23  GLUCOSE 90 94  BUN 110* 102*  CREATININE 6.19* 6.68*  CALCIUM 7.3* 7.1*  AST 13*  --   ALT 15*  --   ALKPHOS 88  --   BILITOT 0.5  --    ------------------------------------------------------------------------------------------------------------------  Cardiac Enzymes  Recent Labs Lab 03/30/17 1749  TROPONINI <0.03   ------------------------------------------------------------------------------------------------------------------  RADIOLOGY:  Ct Abdomen Pelvis Wo Contrast  Result Date: 03/30/2017 CLINICAL DATA:  Dyspnea an acute renal failure EXAM: CT ABDOMEN AND PELVIS WITHOUT CONTRAST TECHNIQUE: Multidetector CT imaging of the abdomen and pelvis was performed following the standard protocol without IV contrast. COMPARISON:  Radiograph 03/30/2017 FINDINGS: Lower chest: Large bilateral pleural effusions. Partial consolidations within the lower lobes may reflect atelectasis or pneumonia. Partial atelectasis in the lingula. Lower heart does not appear enlarged. Hepatobiliary: No focal hepatic abnormality. No calcified stones or biliary dilatation. Pancreas: Unremarkable. No pancreatic ductal dilatation or surrounding inflammatory changes. Spleen: Normal in size without focal abnormality. Adrenals/Urinary Tract: Adrenal glands within normal limits. Kidneys poorly visible due to large habitus and artifact which obscures the kidneys. No gross hydronephrosis is seen. Bladder poorly visible. Stomach/Bowel: The stomach is nonenlarged. No dilated small  or large bowel. Centralized appearance of the bowel. Vascular/Lymphatic: Non aneurysmal aorta. Limited evaluation for adenopathy due to habitus and ascites. Reproductive: Prostate is unremarkable. Other: Negative for free  air. Large amount of ascites. Large amount of fluid and edema within the subcutaneous soft tissues. Musculoskeletal: No acute or suspicious bone lesion IMPRESSION: 1. Large bilateral pleural effusions. Partial consolidations in the lingula and bilateral lower lobes may reflect atelectasis or pneumonia 2. Large amount of ascites in the abdomen and pelvis. Extensive edema and fluid within the subcutaneous soft tissues consistent with anasarca 3. Kidneys poorly visible due to large habitus an artifact. No gross hydronephrosis is seen. Bladder is poorly visible. Electronically Signed   By: Jasmine Pang M.D.   On: 03/30/2017 21:03   Dg Chest Portable 1 View  Result Date: 03/30/2017 CLINICAL DATA:  Shortness of breath and chest pain EXAM: PORTABLE CHEST 1 VIEW COMPARISON:  None. FINDINGS: Mild cardiomegaly. Large left pleural effusion with components along the lateral left hemithorax. Associated atelectasis. Right lung is clear. IMPRESSION: Mild cardiomegaly and large left pleural effusion. Electronically Signed   By: Deatra Robinson M.D.   On: 03/30/2017 18:22     ASSESSMENT AND PLAN:   36 year old male with history of chronic diastolic heart failure, primary pulmonary hypertension and hypothyroidism with chronic kidney disease stage III who presents for [facility] due to shortness of breath.  1. Acute on chronic kidney failure possibly due to diuretics Nephrology consult requested Renal ultrasound (CT scan shows poorly visualized kidneys) Hold diuretics  2. Shortness of breath due to Large left pleural effusion with history of diastolic heart failure and pulmonary hypertension and anasarca Ultrasounded guided paracentesis requested  3. Left thigh wound: Discussed case with  surgery Possible debridement Follow-up wound cultures Wound care consult Continue vancomycin and cefepime  4. Essential hypertension: Continue hydralazine, Norvasc, clonidine and Bisoprolol 5. Hypothyroid: Continue Synthroid 6. Hyperlipidemia: Continue statin 7. Chronic diastolic heart failure with anasarca and acute kidney injury: Difficult to manage at this time due to acute kidney injury Will need thoracentesis  Management plans discussed with the patient and he is in agreement.  CODE STATUS: full  TOTAL TIME TAKING CARE OF THIS PATIENT: 30 minutes.     POSSIBLE D/C 3-4 days, DEPENDING ON CLINICAL CONDITION.   Peyson Delao M.D on 03/31/2017 at 9:34 AM  Between 7am to 6pm - Pager - [phone] After 6pm go to [email or web address] - password [email or web address]  [facility]  Office  [phone]  CC: Primary care physician; Patient, No Pcp Per  Note: This dictation was prepared with Dragon dictation along with smaller phrase technology. Any transcriptional errors that result from this process are unintentional.

## 2017-03-31 NOTE — Procedures (Signed)
SURGICAL OPERATIVE REPORT  DATE OF PROCEDURE: 03/31/2017  ATTENDING: Barbara Cower E. Earlene Plater, MD  ANESTHESIA: local   PRE-OPERATIVE DIAGNOSIS: Left thigh unstageable pressure wound (icd-10's: L89.220)  POST-OPERATIVE DIAGNOSIS: Left thigh Stage 3 pressure wound (icd-10's: U02.334)  PROCEDURE(S):  1.) Sharp excisional debridement of  6 cm x 5 cm Left thigh pressure-associated necrotic wound (cpt's: 11043, 11046)  INTRAOPERATIVE FINDINGS: 6 cm x 5 cm Stage 3 Left thigh pressure-associated necrotic wound  INTRAVENOUS FLUIDS: 0 mL crystalloid   ESTIMATED BLOOD LOSS: Minimal (< 20 mL)  SPECIMENS: No Specimen  IMPLANTS: None  DRAINS: none  COMPLICATIONS: None apparent  CONDITION AT END OF PROCEDURE: Hemodynamically stable and awake  DISPOSITION OF PATIENT: Remaining in patient's med-surg room in same condition as pre-procedure  INDICATIONS FOR PROCEDURE:  36 y.o. obese male with B/L lymphedema, CHF with pulmonary hypertension, and CKD presented to [facility] with worsening SOB. Though patient reports having previously been able to walk with the assist of a walker, his ability to participate with physical therapy was abruptly halted by severe burning LLE pain x 2 months while residing at [facility] nursing facility the past 4 months. While largely immobile on predominantly his Left side, a Left thigh pressure wound was first noticed and dressing changes with an unspecified "cream" were initiated. The wound then progressed from red to brown/black, though his pain improved, and he was referred to [facility] for worsening shortness of breath with AKI superimposed on CKD. Surgery was consulted for debridement of his Left thigh pressure sore. All risks, benefits, and alternatives to above procedure were discussed with the patient, all of patient's questions were answered to his expressed satisfaction, and informed consent was obtained and documented.  DETAILS OF PROCEDURE: While in supine position in  patient's med-surg bed with patient's head of his bed elevated >30 degrees due to his respiratory difficulties, operative site was prepped and draped in the usual fashion, and following a brief time out, a sterile gauze saturated with lidocaine (+epi) was placed on patient's Left thigh wound. Scissors and forceps were then used to excise the outermost cutaneous eschar, after which lidocaine-saturated gauze was replaced and underlying necrotic tissue and fibrinous exudate were further debrided. Due to patient discomfort and encountering better perfused comparatively more viable tissues, hemostasis was confirmed, lidocaine-soaked moist-to-dry dressing was applied with instructions to change dressing with Santyl daily, and procedure was completed.   I was present for all aspects of the above procedure, and there were no complications apparent.

## 2017-03-31 NOTE — Consult Note (Signed)
WOC Nurse wound consult note Reason for Consult:Necrotic wound to left thigh.  Surgical services have been consulted.  Recommend dry dressing and surgical services to evaluate.  Please re-consult if I am needed  Wound type:Infectious Will not follow at this time.  Please re-consult if needed.  Maple Hudson RN BSN CWON Pager [phone]

## 2017-03-31 NOTE — Progress Notes (Signed)
Pharmacy Antibiotic Note  [name] is a 36 y.o. male admitted on 03/30/2017 with cellulitis and wound infection.  Pharmacy has been consulted for vancomycin and cefepime dosing.  Plan: Vancomycin 1000 IV every 24 hours.  Goal trough 10-15 mcg/mL.  Cefepime 1gm iv q24h   Will check a random vancomycin level due to acute renal failure.  Goal trough: 15-25 mcg/ml.   Height: 5\' 9"  (175.3 cm) Weight: (!) 336 lb 3.2 oz (152.5 kg) IBW/kg (Calculated) : 70.7  Temp (24hrs), Avg:97.9 F (36.6 C), Min:97.8 F (36.6 C), Max:97.9 F (36.6 C)   Recent Labs Lab 03/30/17 1749 03/31/17 0408  WBC 11.0* 11.0*  CREATININE 6.19* 6.68*    Estimated Creatinine Clearance: 22.8 mL/min (A) (by C-G formula based on SCr of 6.68 mg/dL (H)).    No Known Allergies  Antimicrobials this admission: 8/17 vancomycin >>  8/17 cefepime >>    Microbiology results: 8/17 Would culture: GPC, GNR, GPR, GNC MRSA PCR: positive    Thank you for allowing pharmacy to be a part of this patient's care.  Luisa Hart D 03/31/2017 2:13 PM

## 2017-03-31 NOTE — NC FL2 (Signed)
[place] MEDICAID FL2 LEVEL OF CARE SCREENING TOOL     IDENTIFICATION  Patient Name: [name] Birthdate: [birth year] Sex: male Admission Date (Current Location): 03/30/2017  [place] and [place] Number:  [address] [phone] T Facility and Address:  [facility], [address], [place], [place] [address]      Provider Number: [id number]  Attending Physician Name and Address:  Adrian Saran, MD  Relative Name and Phone Number:  Melida Quitter (mother) [phone]    Current Level of Care: Hospital Recommended Level of Care: Skilled Nursing Facility Prior Approval Number:    Date Approved/Denied:   PASRR Number: [id number]  Discharge Plan: SNF    Current Diagnoses: Patient Active Problem List   Diagnosis Date Noted  . ARF (acute renal failure) (HCC) 03/30/2017    Orientation RESPIRATION BLADDER Height & Weight     Self, Time, Situation, Place  O2 (2L o2) Continent Weight: (!) 336 lb 3.2 oz (152.5 kg) Height:  5\' 9"  (175.3 cm)  BEHAVIORAL SYMPTOMS/MOOD NEUROLOGICAL BOWEL NUTRITION STATUS      Continent Diet (Heart Healthy (CHF))  AMBULATORY STATUS COMMUNICATION OF NEEDS Skin   Extensive Assist Verbally Surgical wounds, PU Stage and Appropriate Care                       Personal Care Assistance Level of Assistance  Bathing, Feeding, Dressing Bathing Assistance: Maximum assistance Feeding assistance: Independent Dressing Assistance: Maximum assistance     Functional Limitations Info             SPECIAL CARE FACTORS FREQUENCY                       Contractures Contractures Info: Not present    Additional Factors Info  Code Status, Allergies Code Status Info: Full Allergies Info: No Known Allergies           Current Medications (03/31/2017):  This is the current hospital active medication list Current Facility-Administered Medications  Medication Dose Route Frequency Provider Last Rate Last Dose  .  acetaminophen (TYLENOL) tablet 650 mg  650 mg Oral Q6H PRN Enid Baas, MD       Or  . acetaminophen (TYLENOL) suppository 650 mg  650 mg Rectal Q6H PRN Enid Baas, MD      . amLODipine (NORVASC) tablet 10 mg  10 mg Oral Daily Enid Baas, MD   10 mg at 03/31/17 0952  . apixaban (ELIQUIS) tablet 5 mg  5 mg Oral BID Enid Baas, MD   5 mg at 03/31/17 0951  . atorvastatin (LIPITOR) tablet 20 mg  20 mg Oral QHS Enid Baas, MD   20 mg at 03/30/17 2156  . bisoprolol (ZEBETA) tablet 10 mg  10 mg Oral Daily Enid Baas, MD   10 mg at 03/31/17 0951  . buPROPion [facility]) tablet 100 mg  100 mg Oral BID Enid Baas, MD   100 mg at 03/31/17 0952  . ceFEPIme (MAXIPIME) 1 g in dextrose 5 % 50 mL IVPB  1 g Intravenous Q24H Coffee, Gerre Pebbles, [facility]      . Chlorhexidine Gluconate Cloth 2 % PADS 6 each  6 each Topical Q0600 Enid Baas, MD   6 each at 03/31/17 0450  . cloNIDine (CATAPRES) tablet 0.2 mg  0.2 mg Oral BID Enid Baas, MD   0.2 mg at 03/31/17 0952  . docusate sodium (COLACE) capsule 100 mg  100 mg Oral BID Enid Baas, MD   100 mg at  03/30/17 2156  . fentaNYL (SUBLIMAZE) injection 25 mcg  25 mcg Intravenous Q5 Min x 2 PRN Ancil Linsey, MD   25 mcg at 03/31/17 1321  . hydrALAZINE (APRESOLINE) injection 10 mg  10 mg Intravenous Q6H PRN Enid Baas, MD   10 mg at 03/30/17 2016  . hydrALAZINE (APRESOLINE) tablet 50 mg  50 mg Oral TID Enid Baas, MD   50 mg at 03/31/17 0953  . levothyroxine (SYNTHROID, LEVOTHROID) tablet 75 mcg  75 mcg Oral QAC breakfast Enid Baas, MD   75 mcg at 03/31/17 1610  . MEDLINE mouth rinse  15 mL Mouth Rinse BID Enid Baas, MD   15 mL at 03/31/17 1002  . mupirocin ointment (BACTROBAN) 2 % 1 application  1 application Nasal BID Enid Baas, MD   1 application at 03/31/17 0950  . ondansetron (ZOFRAN) tablet 4 mg  4 mg Oral Q6H PRN Enid Baas, MD       Or  .  ondansetron (ZOFRAN) injection 4 mg  4 mg Intravenous Q6H PRN Enid Baas, MD      . traMADol Janean Sark) tablet 50 mg  50 mg Oral Q6H PRN Enid Baas, MD   50 mg at 03/31/17 1006  . vancomycin (VANCOCIN) IVPB 1000 mg/200 mL premix  1,000 mg Intravenous Q24H Coffee, Gerre Pebbles [facility]   Stopped at 03/31/17 9604     Discharge Medications: Please see discharge summary for a list of discharge medications.  Relevant Imaging Results:  Relevant Lab Results:   Additional Information SS# [id number]  Judi Cong, LCSW

## 2017-03-31 NOTE — Progress Notes (Signed)
Pharmacy Antibiotic Note  [name] is a 35 y.o. male admitted on 03/30/2017 with cellulitis and wound infection.  Pharmacy has been consulted for vancomycin and cefepime dosing.  Plan: Random vanc level resulted at 18. Continue current dose of 1g q 24 hours. Continue to monitor renal function closely. Will plan to draw trough prior to the 5th dose, but will draw earlier if changes in renal function occur.  Follow up cx results  Cefepime 1gm iv q24h   Will check a random vancomycin level due to acute renal failure.  Goal trough: 15-20 mcg/ml.   Height: 5\' 9"  (175.3 cm) Weight: (!) 336 lb 3.2 oz (152.5 kg) IBW/kg (Calculated) : 70.7  Temp (24hrs), Avg:97.8 F (36.6 C), Min:97.8 F (36.6 C), Max:97.8 F (36.6 C)   Recent Labs Lab 03/30/17 1749 03/31/17 0408 03/31/17 1516  WBC 11.0* 11.0*  --   CREATININE 6.19* 6.68*  --   VANCORANDOM  --   --  18    Estimated Creatinine Clearance: 22.8 mL/min (A) (by C-G formula based on SCr of 6.68 mg/dL (H)).    No Known Allergies  Antimicrobials this admission: 8/17 vancomycin >>  8/17 cefepime >>    Microbiology results: 8/17 Would culture: GPC, GNR, GPR, GNC MRSA PCR: positive    Thank you for allowing pharmacy to be a part of this patient's care.  Olene Floss 03/31/2017 6:48 PM

## 2017-03-31 NOTE — Clinical Social Work Note (Signed)
CSW received consult that patient is from [facility]. CSW will assess when able.  [name] Martha Xandrea Clarey, MSW, LCSWA [phone] 

## 2017-03-31 NOTE — Consult Note (Signed)
[facility]  CONSULT NOTE    Date: 03/31/2017                  Patient Name:  Charleston Lansdown  MRN: [id number]  DOB: 09/27/1980  Age / Sex: 36 y.o., male         PCP: Patient, No Pcp Per                 Service Requesting Consult: Dr. Juliene Pina                 Reason for Consult: Acute Renal Failure            History of Present Illness: Mr. Killion Homrich is a 36 y.o. white male with hypertension, morbid obesity, pulmonary hypertension, hypothyroidism, hyperlipidemia, depression/anxiety, coronary artery disease, congestive heart failure , who was admitted to [facility] on 03/30/2017 for Swelling [R60.9] ARF (acute renal failure) (HCC) [N17.9] Dyspnea [R06.00] AKI (acute kidney injury) (HCC) [N17.9] Dyspnea, unspecified type [R06.00]   Patient was said to have a baseline creatinine of 1.5. He had his labs checked at [facility] health care where his creatinine was 6. This was thought to be due to overdiuresis and his furosemide was held. However now patient has shortness of breath, anasarca, and acute exacerbation of congestive heart failure.   Patient is originally from [place], [place] and has moved to [facility] care. He denies every being on dialysis before. He does not have a nephrologist.    Medications: Outpatient medications: Prescriptions Prior to Admission  Medication Sig Dispense Refill Last Dose  . amLODipine (NORVASC) 10 MG tablet Take 10 mg by mouth daily.   03/30/2017 at 0800  . amLODipine (NORVASC) 5 MG tablet Take 5 mg by mouth daily.   03/30/2017 at 0800  . apixaban (ELIQUIS) 5 MG TABS tablet Take 5 mg by mouth 2 (two) times daily.   03/30/2017 at 0800  . aspirin 325 MG EC tablet Take 325 mg by mouth daily.   03/30/2017 at 0800  . atorvastatin (LIPITOR) 20 MG tablet Take 20 mg by mouth at bedtime.   03/29/2017 at 2100  . bisoprolol (ZEBETA) 10 MG tablet Take 10 mg by mouth daily.   03/30/2017 at 0800  . buPROPion (WELLBUTRIN) 100 MG tablet Take 100 mg by mouth  2 (two) times daily.   03/30/2017 at 0800  . cloNIDine (CATAPRES) 0.2 MG tablet Take 0.2 mg by mouth 2 (two) times daily.   03/30/2017 at 0800  . collagenase (SANTYL) ointment Apply 1 application topically daily.   Past Week  . hydrALAZINE (APRESOLINE) 50 MG tablet Take 50 mg by mouth 3 (three) times daily.   03/30/2017 at 0800  . levothyroxine (SYNTHROID, LEVOTHROID) 75 MCG tablet Take 75 mcg by mouth daily before breakfast.   03/30/2017 at 0800  . metolazone (ZAROXOLYN) 2.5 MG tablet Take 2.5 mg by mouth every other day.   Past Week at 0800  . nystatin (MYCOSTATIN/NYSTOP) powder Apply topically 2 (two) times daily.   03/30/2017 at 0800  . potassium chloride SA (K-DUR,KLOR-CON) 20 MEQ tablet Take 20 mEq by mouth every other day.   Past Week at 0800  . ondansetron (ZOFRAN) 8 MG tablet Take by mouth every 8 (eight) hours as needed for nausea or vomiting.   prn at prn  . traMADol (ULTRAM) 50 MG tablet Take 50 mg by mouth every 8 (eight) hours as needed.   prn at prn    Current medications: Current Facility-Administered Medications  Medication  Dose Route Frequency Provider Last Rate Last Dose  . fentaNYL (SUBLIMAZE) 100 MCG/2ML injection           . acetaminophen (TYLENOL) tablet 650 mg  650 mg Oral Q6H PRN Gladstone Lighter, MD       Or  . acetaminophen (TYLENOL) suppository 650 mg  650 mg Rectal Q6H PRN Gladstone Lighter, MD      . amLODipine (NORVASC) tablet 10 mg  10 mg Oral Daily Gladstone Lighter, MD   10 mg at 03/31/17 0952  . apixaban (ELIQUIS) tablet 5 mg  5 mg Oral BID Gladstone Lighter, MD   5 mg at 03/31/17 0951  . atorvastatin (LIPITOR) tablet 20 mg  20 mg Oral QHS Gladstone Lighter, MD   20 mg at 03/30/17 2156  . bisoprolol (ZEBETA) tablet 10 mg  10 mg Oral Daily Gladstone Lighter, MD   10 mg at 03/31/17 0951  . buPROPion [facility]) tablet 100 mg  100 mg Oral BID Gladstone Lighter, MD   100 mg at 03/31/17 0952  . ceFEPIme (MAXIPIME) 1 g in dextrose 5 % 50 mL IVPB  1 g  Intravenous Q24H Coffee, Donna Christen, [facility]      . Chlorhexidine Gluconate Cloth 2 % PADS 6 each  6 each Topical Q0600 Gladstone Lighter, MD   6 each at 03/31/17 0450  . cloNIDine (CATAPRES) tablet 0.2 mg  0.2 mg Oral BID Gladstone Lighter, MD   0.2 mg at 03/31/17 0952  . docusate sodium (COLACE) capsule 100 mg  100 mg Oral BID Gladstone Lighter, MD   100 mg at 03/30/17 2156  . fentaNYL (SUBLIMAZE) injection 25 mcg  25 mcg Intravenous Q5 Min x 2 PRN Vickie Epley, MD      . furosemide (LASIX) 120 mg in dextrose 5 % 50 mL IVPB  120 mg Intravenous Once Chi Garlow, MD      . hydrALAZINE (APRESOLINE) injection 10 mg  10 mg Intravenous Q6H PRN Gladstone Lighter, MD   10 mg at 03/30/17 2016  . hydrALAZINE (APRESOLINE) tablet 50 mg  50 mg Oral TID Gladstone Lighter, MD   50 mg at 03/31/17 0953  . levothyroxine (SYNTHROID, LEVOTHROID) tablet 75 mcg  75 mcg Oral QAC breakfast Gladstone Lighter, MD   75 mcg at 03/31/17 1093  . MEDLINE mouth rinse  15 mL Mouth Rinse BID Gladstone Lighter, MD   15 mL at 03/31/17 1002  . mupirocin ointment (BACTROBAN) 2 % 1 application  1 application Nasal BID Gladstone Lighter, MD   1 application at 23/55/73 0950  . ondansetron (ZOFRAN) tablet 4 mg  4 mg Oral Q6H PRN Gladstone Lighter, MD       Or  . ondansetron (ZOFRAN) injection 4 mg  4 mg Intravenous Q6H PRN Gladstone Lighter, MD      . traMADol Veatrice Bourbon) tablet 50 mg  50 mg Oral Q6H PRN Gladstone Lighter, MD   50 mg at 03/31/17 1006  . vancomycin (VANCOCIN) IVPB 1000 mg/200 mL premix  1,000 mg Intravenous Q24H Coffee, Donna Christen, [facility]   Stopped at 03/31/17 2202      Allergies: No Known Allergies    Past Medical History: Past Medical History:  Diagnosis Date  . CKD (chronic kidney disease)   . Depression   . Diastolic CHF ([place])   . Generalized anxiety disorder   . Hyperlipidemia   . Hypertension   . Hypertension   . Hypothyroidism   . Lymphedema   . Obesity   . Pulmonary hypertension ([place])  Past Surgical History: Past Surgical History:  Procedure Laterality Date  . CARDIAC CATHETERIZATION       Family History: Family History  Problem Relation Age of Onset  . Hypertension Mother      Social History: Social History   Social History  . Marital status: Single    Spouse name: N/A  . Number of children: N/A  . Years of education: N/A   Occupational History  . Not on file.   Social History Main Topics  . Smoking status: Passive Smoke Exposure - Never Smoker    Types: Cigarettes  . Smokeless tobacco: Never Used  . Alcohol use No  . Drug use: Unknown  . Sexual activity: Not on file   Other Topics Concern  . Not on file   Social History Narrative   From [place]   Has a walker     Review of Systems: Review of Systems  Constitutional: Negative.  Negative for chills, diaphoresis, fever, malaise/fatigue and weight loss.  HENT: Negative.  Negative for congestion, ear discharge, ear pain, hearing loss, nosebleeds, sinus pain, sore throat and tinnitus.   Eyes: Negative.  Negative for blurred vision, double vision, photophobia, pain, discharge and redness.  Respiratory: Positive for shortness of breath and wheezing. Negative for cough, hemoptysis, sputum production and stridor.   Cardiovascular: Positive for leg swelling. Negative for chest pain, palpitations, orthopnea, claudication and PND.  Gastrointestinal: Negative.  Negative for abdominal pain, blood in stool, constipation, diarrhea, heartburn, melena, nausea and vomiting.  Genitourinary: Negative.  Negative for dysuria, flank pain, frequency, hematuria and urgency.  Musculoskeletal: Negative.  Negative for back pain, falls, joint pain, myalgias and neck pain.  Skin: Negative.  Negative for itching and rash.  Neurological: Negative.  Negative for dizziness, tingling, tremors, sensory change, speech change, focal weakness, seizures, loss of consciousness, weakness and headaches.   Endo/Heme/Allergies: Negative.  Negative for environmental allergies and polydipsia. Does not bruise/bleed easily.  Psychiatric/Behavioral: Negative.  Negative for depression, hallucinations, memory loss, substance abuse and suicidal ideas. The patient is not nervous/anxious and does not have insomnia.     Vital Signs: Blood pressure (!) 173/108, pulse (!) 124, temperature 97.8 F (36.6 C), temperature source Oral, resp. rate (!) 24, height 5\' 9"  (1.753 m), weight (!) 152.5 kg (336 lb 3.2 oz), SpO2 97 %.  Weight trends: Filed Weights   03/30/17 1736 03/30/17 2101  Weight: (!) 145.2 kg (320 lb) (!) 152.5 kg (336 lb 3.2 oz)    Physical Exam: General: NAD, laying in bed  Head: Normocephalic, atraumatic. Moist oral mucosal membranes  Eyes: Anicteric, PERRL  Neck: Supple, trachea midline  Lungs:  Diminished bilaterally  Heart: Regular rate and rhythm  Abdomen:  Soft, nontender, obese, +abdominal wall edema  Extremities: 3+ peripheral edema.  Neurologic: Nonfocal, moving all four extremities  Skin: No lesions  GU:  Scrotal edema  Access: none     Lab results: Basic Metabolic Panel:  Recent Labs Lab 03/30/17 1749 03/31/17 0408  NA 139 142  K 4.5 4.6  CL 105 107  CO2 22 23  GLUCOSE 90 94  BUN 110* 102*  CREATININE 6.19* 6.68*  CALCIUM 7.3* 7.1*    Liver Function Tests:  Recent Labs Lab 03/30/17 1749  AST 13*  ALT 15*  ALKPHOS 88  BILITOT 0.5  PROT 7.4  ALBUMIN 1.7*   No results for input(s): LIPASE, AMYLASE in the last 168 hours. No results for input(s): AMMONIA in the last 168 hours.  CBC:  Recent Labs Lab 03/30/17 1749 03/31/17 0408  WBC 11.0* 11.0*  NEUTROABS 8.6*  --   HGB 9.0* 8.9*  HCT 28.3* 26.2*  MCV 81.7 82.3  PLT 537* 461*    Cardiac Enzymes:  Recent Labs Lab 03/30/17 1749  TROPONINI <0.03    BNP: Invalid input(s): POCBNP  CBG: No results for input(s): GLUCAP in the last 168 hours.  Microbiology: Results for orders placed  or performed during the hospital encounter of 03/30/17  Aerobic/Anaerobic Culture (surgical/deep wound)     Status: None (Preliminary result)   Collection Time: 03/30/17  7:56 PM  Result Value Ref Range Status   Specimen Description WOUND LEFT THIGH  Final   Special Requests NONE  Final   Gram Stain   Final    ABUNDANT WBC PRESENT, PREDOMINANTLY PMN ABUNDANT GRAM POSITIVE COCCI ABUNDANT GRAM NEGATIVE RODS MODERATE GRAM POSITIVE RODS MODERATE GRAM NEGATIVE COCCI Performed at [facility], [address] [place], [place] [address]    Culture PENDING  Incomplete   Report Status PENDING  Incomplete  MRSA PCR Screening     Status: Abnormal   Collection Time: 03/30/17  8:58 PM  Result Value Ref Range Status   MRSA by PCR POSITIVE (A) NEGATIVE Final    Comment:        The GeneXpert MRSA Assay (FDA approved for NASAL specimens only), is one component of a comprehensive MRSA colonization surveillance program. It is not intended to diagnose MRSA infection nor to guide or monitor treatment for MRSA infections. RESULT CALLED TO, READ BACK BY AND VERIFIED WITH: KARA CAMPBELL AT 2230 03/30/17.PMH     Coagulation Studies:  Recent Labs  03/30/17 1749  LABPROT 16.3*  INR 1.30    Urinalysis: No results for input(s): COLORURINE, LABSPEC, PHURINE, GLUCOSEU, HGBUR, BILIRUBINUR, KETONESUR, PROTEINUR, UROBILINOGEN, NITRITE, LEUKOCYTESUR in the last 72 hours.  Invalid input(s): APPERANCEUR    Imaging: Ct Abdomen Pelvis Wo Contrast  Result Date: 03/30/2017 CLINICAL DATA:  Dyspnea an acute renal failure EXAM: CT ABDOMEN AND PELVIS WITHOUT CONTRAST TECHNIQUE: Multidetector CT imaging of the abdomen and pelvis was performed following the standard protocol without IV contrast. COMPARISON:  Radiograph 03/30/2017 FINDINGS: Lower chest: Large bilateral pleural effusions. Partial consolidations within the lower lobes may reflect atelectasis or pneumonia. Partial atelectasis in the lingula.  Lower heart does not appear enlarged. Hepatobiliary: No focal hepatic abnormality. No calcified stones or biliary dilatation. Pancreas: Unremarkable. No pancreatic ductal dilatation or surrounding inflammatory changes. Spleen: Normal in size without focal abnormality. Adrenals/Urinary Tract: Adrenal glands within normal limits. Kidneys poorly visible due to large habitus and artifact which obscures the kidneys. No gross hydronephrosis is seen. Bladder poorly visible. Stomach/Bowel: The stomach is nonenlarged. No dilated small or large bowel. Centralized appearance of the bowel. Vascular/Lymphatic: Non aneurysmal aorta. Limited evaluation for adenopathy due to habitus and ascites. Reproductive: Prostate is unremarkable. Other: Negative for free air. Large amount of ascites. Large amount of fluid and edema within the subcutaneous soft tissues. Musculoskeletal: No acute or suspicious bone lesion IMPRESSION: 1. Large bilateral pleural effusions. Partial consolidations in the lingula and bilateral lower lobes may reflect atelectasis or pneumonia 2. Large amount of ascites in the abdomen and pelvis. Extensive edema and fluid within the subcutaneous soft tissues consistent with anasarca 3. Kidneys poorly visible due to large habitus an artifact. No gross hydronephrosis is seen. Bladder is poorly visible. Electronically Signed   By: Jasmine Pang M.D.   On: 03/30/2017 21:03   US Renal  Result  Date: 03/31/2017 CLINICAL DATA:  Evaluate for hydronephrosis. EXAM: RENAL / URINARY TRACT ULTRASOUND COMPLETE COMPARISON:  CT, 03/30/2017 FINDINGS: Right Kidney: Length: 12 cm. Mild increased parenchymal echogenicity. No mass or stone. No hydronephrosis. Left Kidney: Length: 11.1 cm. Normal parenchymal echogenicity. Small hypoechoic lesion in the lateral midpole of the kidney measuring 12 mm, not fully characterize but likely a cyst. No other renal masses, no stones and no hydronephrosis. Bladder: Appears normal for degree of  bladder distention. IMPRESSION: 1. No acute findings.  No hydronephrosis. Electronically Signed   By: Lajean Manes M.D.   On: 03/31/2017 11:34   US Venous Img Lower Bilateral  Result Date: 03/31/2017 CLINICAL DATA:  Bilateral lower extremity swelling, for 3 years but worse over the last several months. EXAM: BILATERAL LOWER EXTREMITY VENOUS DOPPLER ULTRASOUND TECHNIQUE: Gray-scale sonography with graded compression, as well as color Doppler and duplex ultrasound were performed to evaluate the lower extremity deep venous systems from the level of the common femoral vein and including the common femoral, femoral, profunda femoral, popliteal and calf veins including the posterior tibial, peroneal and gastrocnemius veins when visible. The superficial great saphenous vein was also interrogated. Spectral Doppler was utilized to evaluate flow at rest and with distal augmentation maneuvers in the common femoral, femoral and popliteal veins. COMPARISON:  None. FINDINGS: RIGHT LOWER EXTREMITY Common Femoral Vein: There is nonocclusive thrombus in the common femoral vein extending to the sacral femoral junction. Saphenofemoral Junction: Thrombus extends from the common femoral vein to the fat for femoral junction, nonocclusive. Profunda Femoral Vein: No evidence of thrombus. Normal compressibility and flow on color Doppler imaging. Femoral Vein: No evidence of thrombus. Normal compressibility, respiratory phasicity and response to augmentation. Popliteal Vein: No evidence of thrombus. Normal compressibility, respiratory phasicity and response to augmentation. Calf Veins: No evidence of thrombus. Normal compressibility and flow on color Doppler imaging. Superficial Great Saphenous Vein: No evidence of thrombus. Normal compressibility and flow on color Doppler imaging. Venous Reflux:  None. Other Findings:  None. LEFT LOWER EXTREMITY Common Femoral Vein: No evidence of thrombus. Normal compressibility, respiratory phasicity  and response to augmentation. Saphenofemoral Junction: No evidence of thrombus. Normal compressibility and flow on color Doppler imaging. Profunda Femoral Vein: No evidence of thrombus. Normal compressibility and flow on color Doppler imaging. Femoral Vein: No evidence of thrombus. Normal compressibility, respiratory phasicity and response to augmentation. Popliteal Vein: No evidence of thrombus. Normal compressibility, respiratory phasicity and response to augmentation. Calf Veins: No evidence of thrombus. Normal compressibility and flow on color Doppler imaging. Superficial Great Saphenous Vein: No evidence of thrombus. Normal compressibility and flow on color Doppler imaging. Venous Reflux:  None. Other Findings:  None. IMPRESSION: 1. Nonocclusive thrombus in the right common femoral vein extending to the saphenous femoral junction. This may be chronic. 2. No other evidence of deep venous thrombosis in the right lower extremity. 3. No evidence of left lower extremity deep venous thrombosis. Electronically Signed   By: Lajean Manes M.D.   On: 03/31/2017 11:36   Dg Chest Portable 1 View  Result Date: 03/30/2017 CLINICAL DATA:  Shortness of breath and chest pain EXAM: PORTABLE CHEST 1 VIEW COMPARISON:  None. FINDINGS: Mild cardiomegaly. Large left pleural effusion with components along the lateral left hemithorax. Associated atelectasis. Right lung is clear. IMPRESSION: Mild cardiomegaly and large left pleural effusion. Electronically Signed   By: Ulyses Jarred M.D.   On: 03/30/2017 18:22      Assessment & Plan: Mr. Vaibhav Fogleman is a 36 y.o.  white male with hypertension, morbid obesity, pulmonary hypertension, hypothyroidism, hyperlipidemia, depression/anxiety, coronary artery disease, congestive heart failure , who was admitted to [facility] on 03/30/2017 for Swelling [R60.9] ARF (acute renal failure) (HCC) [N17.9] Dyspnea [R06.00] AKI (acute kidney injury) (HCC) [N17.9] Dyspnea, unspecified type [R06.00]    1. Acute renal failure on chronic kidney disease stage III: no urine studies available.  No baseline creatinine available.  Low threshold to start dialysis with patient. Volume overload. Anasarca and with albumin of 1.7, suspect nephrotic syndrome.  Renal ultrasound reviewed.  Not a candidate for kidney biopsy due to body habitus - Trial of furosemide IV 120mg  x 1 - Check urine studies, SPEP/UPEP, free light chains, HIV, viral hepatitis, PLA2R, ANA, ANCA, anti-GBM, PTH  2. Hypertension: elevated at 173/108 - echocardiogram pending - Restart furosemide IV - amlodipine, clonidine, hydralazine and bisoprolol   LOS: 1 Estefani Bateson 8/18/20181:18 PM

## 2017-03-31 NOTE — Clinical Social Work Note (Signed)
Clinical Social Work Assessment  Patient Details  Name: [name] MRN: [id number] Date of Birth: 04/01/1981  Date of referral:  03/31/17               Reason for consult:  Facility Placement                Permission sought to share information with:  Facility Contact Representative Permission granted to share information::  Yes, Verbal Permission Granted  Name::        Agency::  [facility]  Relationship::     Contact Information:     Housing/Transportation Living arrangements for the past 2 months:  Skilled Nursing Facility Source of Information:  Medical Team, Facility, Patient Patient Interpreter Needed:  None Criminal Activity/Legal Involvement Pertinent to Current Situation/Hospitalization:  No - Comment as needed Significant Relationships:  Parents Lives with:  Facility Resident Do you feel safe going back to the place where you live?  Yes Need for family participation in patient care:  No (Coment)  Care giving concerns: Patient admitted from [facility] LTC   Social Worker assessment / plan:  CSW met with the patient at bedside to discuss discharge planning. The patient confirmed that he is a LTC resident of [facility] and plans to return once his wounds are debrided and he is stable. [facility] confirmed that the patient can return at that time. The patient has chronic o2 and is typically mobile only by wheelchair with occasional usage of a 2WRW. The injuries to the patient's left upper thigh and side seem to be caused by pressure and show signs of cellulitis.   CSW will continue to follow for discharge facilitation. Patient will most likely require EMS at time of discharge.  Employment status:  Disabled (Comment on whether or not currently receiving Disability) (Patient would not comment on his disability payment status.) Insurance information:  Medicaid In State, Managed Care PT Recommendations:  Not  assessed at this time Information / Referral to community resources:     Patient/Family's Response to care:  The patient thanked the CSW for assistance. Patient/Family's Understanding of and Emotional Response to Diagnosis, Current Treatment, and Prognosis:The patient verbalized understanding of his condition and needs for wound care.  Emotional Assessment Appearance:  Appears stated age Attitude/Demeanor/Rapport:  Lethargic Affect (typically observed):  Flat Orientation:  Oriented to Self, Oriented to Place, Oriented to  Time, Oriented to Situation Alcohol / Substance use:  Never Used Psych involvement (Current and /or in the community):  No (Comment)  Discharge Needs  Concerns to be addressed:  Care Coordination, Discharge Planning Concerns Readmission within the last 30 days:  No Current discharge risk:  Chronically ill Barriers to Discharge:  Continued Medical Work up   Karen M White, LCSW 03/31/2017, 4:32 PM  

## 2017-03-31 NOTE — Consult Note (Addendum)
SURGICAL CONSULTATION NOTE (initial) - cpt: [id number]  HISTORY OF PRESENT ILLNESS (HPI):  36 y.o. obese male with significant medical comorbidities including B/L lymphedema, CHF with pulmonary hypertension, and CKD presented to [facility] ED with worsening SOB. Though patient reports having previously been able to walk with the assist of a walker, he describes having developed severe burning pain in his Left leg that limited his ability to participate in physical therapy x 2 months while residing at [facility] nursing facility the past 4 months. While largely immobile, patient was laying predominantly on his Left side until a Left thigh pressure wound was first noticed, and dressing changes with a prescribed "cream" were initiated. Coloration of the wound then progressed from red to brown/black, though his LLE pain has since improved. He describes that he is now able to reposition himself and has been using a wedge to keep the affected area beside rather than under him. He was referred to and admitted at [facility] for worsening shortness of breath with AKI superimposed on CKD. Patient otherwise denies fever/chills, N/V, or CP. Of note, patient has also been prescribed and takes Eliquis for suspected lower extremity DVT.  Surgery is consulted by medical physician Dr. Juliene Pina in this context for evaluation and management of Left thigh wound.  PAST MEDICAL HISTORY (PMH):  Past Medical History:  Diagnosis Date  . CKD (chronic kidney disease)   . Depression   . Diastolic CHF (HCC)   . Generalized anxiety disorder   . Hyperlipidemia   . Hypertension   . Hypertension   . Hypothyroidism   . Lymphedema   . Obesity   . Pulmonary hypertension (HCC)      PAST SURGICAL HISTORY (PSH):  Past Surgical History:  Procedure Laterality Date  . CARDIAC CATHETERIZATION       MEDICATIONS:  Prior to Admission medications   Medication Sig Start Date End Date Taking? Authorizing Provider  amLODipine (NORVASC) 10 MG  tablet Take 10 mg by mouth daily.   Yes [provider]  amLODipine (NORVASC) 5 MG tablet Take 5 mg by mouth daily.   Yes [provider]  apixaban (ELIQUIS) 5 MG TABS tablet Take 5 mg by mouth 2 (two) times daily.   Yes [provider]  aspirin 325 MG EC tablet Take 325 mg by mouth daily.   Yes [provider]  atorvastatin (LIPITOR) 20 MG tablet Take 20 mg by mouth at bedtime.   Yes [provider]  bisoprolol (ZEBETA) 10 MG tablet Take 10 mg by mouth daily.   Yes [provider]  buPROPion (WELLBUTRIN) 100 MG tablet Take 100 mg by mouth 2 (two) times daily.   Yes [provider]  cloNIDine (CATAPRES) 0.2 MG tablet Take 0.2 mg by mouth 2 (two) times daily.   Yes [provider]  collagenase (SANTYL) ointment Apply 1 application topically daily.   Yes [provider]  hydrALAZINE (APRESOLINE) 50 MG tablet Take 50 mg by mouth 3 (three) times daily.   Yes [provider]  levothyroxine (SYNTHROID, LEVOTHROID) 75 MCG tablet Take 75 mcg by mouth daily before breakfast.   Yes [provider]  metolazone (ZAROXOLYN) 2.5 MG tablet Take 2.5 mg by mouth every other day.   Yes [provider]  nystatin (MYCOSTATIN/NYSTOP) powder Apply topically 2 (two) times daily.   Yes [provider]  potassium chloride SA (K-DUR,KLOR-CON) 20 MEQ tablet Take 20 mEq by mouth every other day.   Yes [provider]  ondansetron (  ZOFRAN) 8 MG tablet Take by mouth every 8 (eight) hours as needed for nausea or vomiting.    [provider]  traMADol (ULTRAM) 50 MG tablet Take 50 mg by mouth every 8 (eight) hours as needed.    [provider]     ALLERGIES:  No Known Allergies   SOCIAL HISTORY:  Social History   Social History  . Marital status: Single    Spouse name: N/A  . Number of children: N/A  . Years of education: N/A   Occupational History  . Not on file.   Social  History Main Topics  . Smoking status: Passive Smoke Exposure - Never Smoker    Types: Cigarettes  . Smokeless tobacco: Never Used  . Alcohol use No  . Drug use: Unknown  . Sexual activity: Not on file   Other Topics Concern  . Not on file   Social History Narrative   From [facility]   Has a walker    The patient currently resides (home / rehab facility / nursing home): [facility]  The patient normally is (ambulatory / bedbound): Primarily limited to wheelchair x 2+ months   FAMILY HISTORY:  Family History  Problem Relation Age of Onset  . Hypertension Mother     REVIEW OF SYSTEMS:  Constitutional: denies weight loss, fever, chills, or sweats  Eyes: denies any other vision changes, history of eye injury  ENT: denies sore throat, hearing problems  Respiratory: +shortness of breath (worse when laying flat), denies wheezing  Cardiovascular: denies chest pain, palpitations  Gastrointestinal: denies abdominal pain, N/V, or diarrhea Genitourinary: denies burning with urination or urinary frequency, +urinary hesitancy Musculoskeletal: denies any other joint pains or cramps  Skin: denies any other rashes or skin discolorations except as per HPI Neurological: denies any other headache, dizziness, weakness  Psychiatric: reports +depression and +anxiety  All other review of systems were negative   VITAL SIGNS:  Temp:  [97.8 F (36.6 C)-97.9 F (36.6 C)] 97.8 F (36.6 C) (08/17 2101) Pulse Rate:  [108-124] 124 (08/17 2101) Resp:  [18-24] 24 (08/17 2101) BP: (162-176)/(106-120) 173/108 (08/17 2101) SpO2:  [97 %-100 %] 97 % (08/17 2101) Weight:  [320 lb (145.2 kg)-336 lb 3.2 oz (152.5 kg)] 336 lb 3.2 oz (152.5 kg) (08/17 2101)     Height: 5\' 9"  (175.3 cm) Weight: (!) 336 lb 3.2 oz (152.5 kg) BMI (Calculated): 49.8   INTAKE/OUTPUT:  This shift: Total I/O In: 200 [P.O.:200] Out: -   Last 2 shifts: @IOLAST2SHIFTS @   PHYSICAL EXAM:  Constitutional:  --  Non-ambulatory morbidly obese body habitus with large abdominal panus hanging to patient's Left -- Awake, alert, and oriented x3  Eyes:  -- Pupils equally round and reactive to light  -- No scleral icterus  Ear, nose, and throat:  -- No jugular venous distension  Pulmonary:  -- No crackles  -- Equally decreased breath sounds bilaterally, attributable at least in part to body habitus -- Breathing non-labored at rest with head of bed elevated >30 degrees Cardiovascular:  -- S1, S2 present  -- No pericardial rubs Gastrointestinal:  -- Abdomen soft, nontender, and obese with no guarding or rebound tenderness -- No abdominal masses appreciated, pulsatile or otherwise  Musculoskeletal and Integumentary:  -- Wounds or skin discoloration: 6 x 5 x 1 cm (depth) moderately tender Left thigh pressure wound (unstagable due to overlying black-brown eschar) with surrounding fibrinous exudate and minimal erythema -- Extremities: B/L UE and LE FROM, hands and  feet warm, B/L LE lymphedema Neurologic:  -- Motor function: intact and symmetric, though limited -- Sensation: grossly intact and symmetric  Labs:  CBC Latest Ref Rng & Units 03/31/2017 03/30/2017  WBC 3.8 - 10.6 K/uL 11.0(H) 11.0(H)  Hemoglobin 13.0 - 18.0 g/dL 4.0(J) 8.1(X)  Hematocrit 40.0 - 52.0 % 26.2(L) 28.3(L)  Platelets 150 - 440 K/uL 461(H) 537(H)   CMP Latest Ref Rng & Units 03/31/2017 03/30/2017  Glucose 65 - 99 mg/dL 94 90  BUN 6 - 20 mg/dL 914(N) 829(F)  Creatinine 0.61 - 1.24 mg/dL 6.21(H) 0.86(V)  Sodium 135 - 145 mmol/L 142 139  Potassium 3.5 - 5.1 mmol/L 4.6 4.5  Chloride 101 - 111 mmol/L 107 105  CO2 22 - 32 mmol/L 23 22  Calcium 8.9 - 10.3 mg/dL 7.1(L) 7.3(L)  Total Protein 6.5 - 8.1 g/dL - 7.4  Total Bilirubin 0.3 - 1.2 mg/dL - 0.5  Alkaline Phos 38 - 126 U/L - 88  AST 15 - 41 U/L - 13(L)  ALT 17 - 63 U/L - 15(L)    Assessment/Plan: (ICD-10's: L89.220) 36 y.o. male with 6 x 5 x 1 cm (depth) moderately tender Left  thigh pressure wound (unstagable due to overlying black-brown eschar) with surrounding fibrinous exudate and minimal erythema, attributed to prolonged immobility, complicated by extensive comorbidities including morbid obesity (BMI 50), HTN, HLD, chronic diastolic CHF with pulmonary HTN, AKI superimposed on CKD, hypothyroidism, ambulatory dysfunction, B/L lymphedema, ongoing anticoagulation for lower extremity DVT (reported), chronic pain disorder, major depression disorder, and generalized anxiety disorder.   - pain control prn  - sharp excisional debridement of Left thigh pressure necrosis wound  - all risks, benefits, and alternatives to debridement of Left thigh pressure necrosis wound were discussed with the patient, all of his questions were answered to his expressed satisfaction, patient expresses he wishes to proceed, and informed consent was obtained.   - pressure offloading from affected site as well as frequent repositioning to minimize development of new wounds   - daily dressing changes with Santyl (collagenase ointment) for chemical-enzymatic debridement  - physical therapy whenever able, lower extremity compression therapy for lymphedema  - antibiotics and medical management of comorbidities as per medical team  - please call if any further surgical questions  All of the above findings and recommendations were discussed with the patient and his RN, and all of patient's questions were answered to his expressed satisfaction.  Thank you for the opportunity to participate in this patient's care.   -- Scherrie Gerlach Earlene Plater, MD, RPVI [place]: [facility] - Partnering for exceptional care. Office: [phone]

## 2017-04-01 LAB — COMPREHENSIVE METABOLIC PANEL
ALBUMIN: 1.6 g/dL — AB (ref 3.5–5.0)
ALK PHOS: 70 U/L (ref 38–126)
ALT: 13 U/L — AB (ref 17–63)
ANION GAP: 10 (ref 5–15)
AST: 11 U/L — AB (ref 15–41)
BILIRUBIN TOTAL: 0.5 mg/dL (ref 0.3–1.2)
BUN: 102 mg/dL — ABNORMAL HIGH (ref 6–20)
CALCIUM: 7.2 mg/dL — AB (ref 8.9–10.3)
CO2: 24 mmol/L (ref 22–32)
Chloride: 107 mmol/L (ref 101–111)
Creatinine, Ser: 6.6 mg/dL — ABNORMAL HIGH (ref 0.61–1.24)
GFR calc Af Amer: 11 mL/min — ABNORMAL LOW (ref >60)
GFR calc non Af Amer: 10 mL/min — ABNORMAL LOW (ref >60)
Glucose, Bld: 92 mg/dL (ref 65–99)
Potassium: 4.6 mmol/L (ref 3.5–5.1)
SODIUM: 141 mmol/L (ref 135–145)
TOTAL PROTEIN: 6.7 g/dL (ref 6.5–8.1)

## 2017-04-01 LAB — CBC
HEMATOCRIT: 28.2 % — AB (ref 40.0–52.0)
HEMOGLOBIN: 8.9 g/dL — AB (ref 13.0–18.0)
MCH: 26.2 pg (ref 26.0–34.0)
MCHC: 31.5 g/dL — AB (ref 32.0–36.0)
MCV: 83.1 fL (ref 80.0–100.0)
Platelets: 501 10*3/uL — ABNORMAL HIGH (ref 150–440)
RBC: 3.39 MIL/uL — AB (ref 4.40–5.90)
RDW: 15.3 % — ABNORMAL HIGH (ref 11.5–14.5)
WBC: 13 10*3/uL — AB (ref 3.8–10.6)

## 2017-04-01 LAB — HIV ANTIBODY (ROUTINE TESTING W REFLEX): HIV SCREEN 4TH GENERATION: NONREACTIVE

## 2017-04-01 LAB — PROTIME-INR
INR: 1.46
PROTHROMBIN TIME: 17.9 s — AB (ref 11.4–15.2)

## 2017-04-01 LAB — PHOSPHORUS: PHOSPHORUS: 9.1 mg/dL — AB (ref 2.5–4.6)

## 2017-04-01 LAB — PREALBUMIN: PREALBUMIN: 9.1 mg/dL — AB (ref 18–38)

## 2017-04-01 MED ORDER — ALBUMIN HUMAN 25 % IV SOLN
12.5000 g | Freq: Three times a day (TID) | INTRAVENOUS | Status: DC
  Administered 2017-04-01 – 2017-04-02 (×3): 12.5 g via INTRAVENOUS
  Filled 2017-04-01 (×6): qty 50

## 2017-04-01 MED ORDER — COLLAGENASE 250 UNIT/GM EX OINT
TOPICAL_OINTMENT | Freq: Every day | CUTANEOUS | Status: DC
  Administered 2017-04-01 – 2017-04-02 (×2): via TOPICAL
  Administered 2017-04-03: 1 via TOPICAL
  Administered 2017-04-04 – 2017-04-07 (×4): via TOPICAL
  Administered 2017-04-09: 1 via TOPICAL
  Administered 2017-04-10 – 2017-04-16 (×7): via TOPICAL
  Filled 2017-04-01 (×2): qty 30

## 2017-04-01 MED ORDER — FUROSEMIDE 10 MG/ML IJ SOLN
80.0000 mg | Freq: Two times a day (BID) | INTRAMUSCULAR | Status: DC
  Administered 2017-04-01 – 2017-04-02 (×4): 80 mg via INTRAVENOUS
  Filled 2017-04-01 (×5): qty 8

## 2017-04-01 MED ORDER — CALCIUM ACETATE (PHOS BINDER) 667 MG PO CAPS
667.0000 mg | ORAL_CAPSULE | Freq: Three times a day (TID) | ORAL | Status: DC
  Administered 2017-04-01 – 2017-04-06 (×8): 667 mg via ORAL
  Filled 2017-04-01 (×14): qty 1

## 2017-04-01 NOTE — Progress Notes (Addendum)
[facility] [place] at [facility]   PATIENT NAME: [name]    MR#:  [id number]  DATE OF BIRTH:  [birth year]  SUBJECTIVE:    Patient underwent bedside debridement yesterday with surgery. Continues have some shortness of breath. Worried that he is not eating well  REVIEW OF SYSTEMS:    Review of Systems  Constitutional: Negative for fever, chills weight loss HENT: Negative for ear pain, nosebleeds, congestion, facial swelling, rhinorrhea, neck pain, neck stiffness and ear discharge.   Respiratory: Negative for cough, ++shortness of breath, no wheezing  Cardiovascular: Negative for chest pain, palpitations and ++ leg swelling. ++anasarcaGastrointestinal: Negative for heartburn, abdominal pain, vomiting, diarrhea or consitpation Genitourinary: Negative for dysuria, urgency, frequency, hematuria Musculoskeletal: Negative for back pain or joint pain Neurological: Negative for dizziness, seizures, syncope, focal weakness,  numbness and headaches.  Hematological: Does not bruise/bleed easily.  Psychiatric/Behavioral: Negative for hallucinations, confusion, dysphoric mood Skin: left thigh wound covered Appetite is poor  Tolerating Diet: poor appetite     DRUG ALLERGIES:  No Known Allergies  VITALS:  Blood pressure (!) 141/97, pulse 77, temperature 98.7 F (37.1 C), resp. rate 20, height 5\' 9"  (1.753 m), weight (!) 152.5 kg (336 lb 3.2 oz), SpO2 99 %.  PHYSICAL EXAMINATION:  Constitutional: Appears well-developed and well-nourished. NAD HENT: Normocephalic. [name] Oropharynx is clear and moist.  Eyes: Conjunctivae and EOM are normal. PERRLA, no scleral icterus.  Neck: Normal ROM. Neck supple. No JVD. No tracheal deviation. CVS: RRR, S1/S2 +, no murmurs, no gallops, no carotid bruit.  Pulmonary: Effort and breath sounds normal, no stridor, rhonchi, wheezes, rales.  Abdominal: abdominal wall edema scrotal edema noted +ANASARCA BS + no rebound  guarding Musculoskeletal: Normal range of motion. No edema and no tenderness.  Neuro: Alert. CN 2-12 grossly intact. No focal deficits. Skin:left thigh covered  Psychiatric: Normal mood and affect.      LABORATORY PANEL:   CBC  Recent Labs Lab 04/01/17 0332  WBC 13.0*  HGB 8.9*  HCT 28.2*  PLT 501*   ------------------------------------------------------------------------------------------------------------------  Chemistries   Recent Labs Lab 04/01/17 0332  NA 141  K 4.6  CL 107  CO2 24  GLUCOSE 92  BUN 102*  CREATININE 6.60*  CALCIUM 7.2*  AST 11*  ALT 13*  ALKPHOS 70  BILITOT 0.5   ------------------------------------------------------------------------------------------------------------------  Cardiac Enzymes  Recent Labs Lab 03/30/17 1749  TROPONINI <0.03   ------------------------------------------------------------------------------------------------------------------  RADIOLOGY:  Ct Abdomen Pelvis Wo Contrast  Result Date: 03/30/2017 CLINICAL DATA:  Dyspnea an acute renal failure EXAM: CT ABDOMEN AND PELVIS WITHOUT CONTRAST TECHNIQUE: Multidetector CT imaging of the abdomen and pelvis was performed following the standard protocol without IV contrast. COMPARISON:  Radiograph 03/30/2017 FINDINGS: Lower chest: Large bilateral pleural effusions. Partial consolidations within the lower lobes may reflect atelectasis or pneumonia. Partial atelectasis in the lingula. Lower heart does not appear enlarged. Hepatobiliary: No focal hepatic abnormality. No calcified stones or biliary dilatation. Pancreas: Unremarkable. No pancreatic ductal dilatation or surrounding inflammatory changes. Spleen: Normal in size without focal abnormality. Adrenals/Urinary Tract: Adrenal glands within normal limits. Kidneys poorly visible due to large habitus and artifact which obscures the kidneys. No gross hydronephrosis is seen. Bladder poorly visible. Stomach/Bowel: The stomach is  nonenlarged. No dilated small or large bowel. Centralized appearance of the bowel. Vascular/Lymphatic: Non aneurysmal aorta. Limited evaluation for adenopathy due to habitus and ascites. Reproductive: Prostate is unremarkable. Other: Negative for free air. Large amount of ascites. Large amount of fluid and edema within  the subcutaneous soft tissues. Musculoskeletal: No acute or suspicious bone lesion IMPRESSION: 1. Large bilateral pleural effusions. Partial consolidations in the lingula and bilateral lower lobes may reflect atelectasis or pneumonia 2. Large amount of ascites in the abdomen and pelvis. Extensive edema and fluid within the subcutaneous soft tissues consistent with anasarca 3. Kidneys poorly visible due to large habitus an artifact. No gross hydronephrosis is seen. Bladder is poorly visible. Electronically Signed   By: Jasmine Pang M.D.   On: 03/30/2017 21:03   US Renal  Result Date: 03/31/2017 CLINICAL DATA:  Evaluate for hydronephrosis. EXAM: RENAL / URINARY TRACT ULTRASOUND COMPLETE COMPARISON:  CT, 03/30/2017 FINDINGS: Right Kidney: Length: 12 cm. Mild increased parenchymal echogenicity. No mass or stone. No hydronephrosis. Left Kidney: Length: 11.1 cm. Normal parenchymal echogenicity. Small hypoechoic lesion in the lateral midpole of the kidney measuring 12 mm, not fully characterize but likely a cyst. No other renal masses, no stones and no hydronephrosis. Bladder: Appears normal for degree of bladder distention. IMPRESSION: 1. No acute findings.  No hydronephrosis. Electronically Signed   By: Amie Portland M.D.   On: 03/31/2017 11:34   US Venous Img Lower Bilateral  Result Date: 03/31/2017 CLINICAL DATA:  Bilateral lower extremity swelling, for 3 years but worse over the last several months. EXAM: BILATERAL LOWER EXTREMITY VENOUS DOPPLER ULTRASOUND TECHNIQUE: Gray-scale sonography with graded compression, as well as color Doppler and duplex ultrasound were performed to evaluate the  lower extremity deep venous systems from the level of the common femoral vein and including the common femoral, femoral, profunda femoral, popliteal and calf veins including the posterior tibial, peroneal and gastrocnemius veins when visible. The superficial great saphenous vein was also interrogated. Spectral Doppler was utilized to evaluate flow at rest and with distal augmentation maneuvers in the common femoral, femoral and popliteal veins. COMPARISON:  None. FINDINGS: RIGHT LOWER EXTREMITY Common Femoral Vein: There is nonocclusive thrombus in the common femoral vein extending to the sacral femoral junction. Saphenofemoral Junction: Thrombus extends from the common femoral vein to the fat for femoral junction, nonocclusive. Profunda Femoral Vein: No evidence of thrombus. Normal compressibility and flow on color Doppler imaging. Femoral Vein: No evidence of thrombus. Normal compressibility, respiratory phasicity and response to augmentation. Popliteal Vein: No evidence of thrombus. Normal compressibility, respiratory phasicity and response to augmentation. Calf Veins: No evidence of thrombus. Normal compressibility and flow on color Doppler imaging. Superficial Great Saphenous Vein: No evidence of thrombus. Normal compressibility and flow on color Doppler imaging. Venous Reflux:  None. Other Findings:  None. LEFT LOWER EXTREMITY Common Femoral Vein: No evidence of thrombus. Normal compressibility, respiratory phasicity and response to augmentation. Saphenofemoral Junction: No evidence of thrombus. Normal compressibility and flow on color Doppler imaging. Profunda Femoral Vein: No evidence of thrombus. Normal compressibility and flow on color Doppler imaging. Femoral Vein: No evidence of thrombus. Normal compressibility, respiratory phasicity and response to augmentation. Popliteal Vein: No evidence of thrombus. Normal compressibility, respiratory phasicity and response to augmentation. Calf Veins: No evidence of  thrombus. Normal compressibility and flow on color Doppler imaging. Superficial Great Saphenous Vein: No evidence of thrombus. Normal compressibility and flow on color Doppler imaging. Venous Reflux:  None. Other Findings:  None. IMPRESSION: 1. Nonocclusive thrombus in the right common femoral vein extending to the saphenous femoral junction. This may be chronic. 2. No other evidence of deep venous thrombosis in the right lower extremity. 3. No evidence of left lower extremity deep venous thrombosis. Electronically Signed   By: Onalee Hua  Ormond M.D.   On: 03/31/2017 11:36   Dg Chest Portable 1 View  Result Date: 03/30/2017 CLINICAL DATA:  Shortness of breath and chest pain EXAM: PORTABLE CHEST 1 VIEW COMPARISON:  None. FINDINGS: Mild cardiomegaly. Large left pleural effusion with components along the lateral left hemithorax. Associated atelectasis. Right lung is clear. IMPRESSION: Mild cardiomegaly and large left pleural effusion. Electronically Signed   By: Deatra Robinson M.D.   On: 03/30/2017 18:22     ASSESSMENT AND PLAN:   36 year old male with history of chronic diastolic heart failure, primary pulmonary hypertension and hypothyroidism with chronic kidney disease stage III who presents for [facility] due to shortness of breath.  1. Acute on chronic kidney failure possibly due to diuretics Nephrology consult appreciated. Renal ultrasound showed no hydronephrosis Patient will  need to start hemodialysis tomorrow due to fluid overload   2. Shortness of breath due to Large left pleural effusion with history of diastolic heart failure and pulmonary hypertension and anasarca Patient will start hemodialysis tomorrow Echocardiogram shows normal ejection fraction without major valvular abnormalities   3. Left thigh wound:  Status post bedside debridement day #1 Follow-up wound cultures Wound care consult Continue vancomycin and cefepime Pressure offloading Daily dressing changes with  Santyl  4. Essential hypertension: Continue hydralazine, Norvasc, clonidine and Bisoprolol 5. Hypothyroid: Continue Synthroid 6. Hyperlipidemia: Continue statin 7. Chronic diastolic heart failure with anasarca/chronic lower extremity edema and acute kidney injury: Patient will need to start dialysis Management plans discussed with the patient and he is in agreement.  CODE STATUS: full  TOTAL TIME TAKING CARE OF THIS PATIENT: 24 minutes.     POSSIBLE D/C 4-6days, DEPENDING ON CLINICAL CONDITION.   Detron Carras M.D on 04/01/2017 at 10:34 AM  Between 7am to 6pm - Pager - [phone] After 6pm go to [email or web address] - password [email or web address]  [facility]  Office  [phone]  CC: Primary care physician; Patient, No Pcp Per  Note: This dictation was prepared with Dragon dictation along with smaller phrase technology. Any transcriptional errors that result from this process are unintentional.

## 2017-04-01 NOTE — Progress Notes (Signed)
[facility]  ROUNDING NOTE   Subjective:   Urine output unable to be recorded. Patient states he is having some response to IV furosemide.   Creatinine 6.6 (6.68) BUN 102 (102)   Objective:  Vital signs in last 24 hours:  Temp:  [97.6 F (36.4 C)-98.7 F (37.1 C)] 98.7 F (37.1 C) (08/19 0414) Pulse Rate:  [77-89] 77 (08/19 0414) Resp:  [20] 20 (08/19 0414) BP: (141-147)/(93-97) 141/97 (08/19 0414) SpO2:  [97 %-99 %] 99 % (08/19 0414) FiO2 (%):  [0 %] 0 % (08/19 0414)  Weight change:  Filed Weights   03/30/17 1736 03/30/17 2101  Weight: (!) 145.2 kg (320 lb) (!) 152.5 kg (336 lb 3.2 oz)    Intake/Output: I/O last 3 completed shifts: In: 528.2 [P.O.:200; IV Piggyback:328.2] Out: 0    Intake/Output this shift:  No intake/output data recorded.  Physical Exam: General: NAD, laying in bed  Head: +temporal wasting  Eyes: Anicteric, PERRL  Neck: obese  Lungs:  Diminished bilaterally  Heart: Regular rate and rhythm  Abdomen:  Soft, nontender, obese  Extremities: 3+ peripheral edema, left thigh with dressings: +induration  Neurologic: Nonfocal, moving all four extremities  Skin: No lesions  Access: none    Basic Metabolic Panel:  Recent Labs Lab 03/30/17 1749 03/31/17 0408 04/01/17 0332  NA 139 142 141  K 4.5 4.6 4.6  CL 105 107 107  CO2 22 23 24   GLUCOSE 90 94 92  BUN 110* 102* 102*  CREATININE 6.19* 6.68* 6.60*  CALCIUM 7.3* 7.1* 7.2*  PHOS  --   --  9.1*    Liver Function Tests:  Recent Labs Lab 03/30/17 1749 04/01/17 0332  AST 13* 11*  ALT 15* 13*  ALKPHOS 88 70  BILITOT 0.5 0.5  PROT 7.4 6.7  ALBUMIN 1.7* 1.6*   No results for input(s): LIPASE, AMYLASE in the last 168 hours. No results for input(s): AMMONIA in the last 168 hours.  CBC:  Recent Labs Lab 03/30/17 1749 03/31/17 0408 04/01/17 0332  WBC 11.0* 11.0* 13.0*  NEUTROABS 8.6*  --   --   HGB 9.0* 8.9* 8.9*  HCT 28.3* 26.2* 28.2*  MCV 81.7 82.3 83.1  PLT 537*  461* 501*    Cardiac Enzymes:  Recent Labs Lab 03/30/17 1749  TROPONINI <0.03    BNP: Invalid input(s): POCBNP  CBG: No results for input(s): GLUCAP in the last 168 hours.  Microbiology: Results for orders placed or performed during the hospital encounter of 03/30/17  Aerobic/Anaerobic Culture (surgical/deep wound)     Status: None (Preliminary result)   Collection Time: 03/30/17  7:56 PM  Result Value Ref Range Status   Specimen Description WOUND LEFT THIGH  Final   Special Requests NONE  Final   Gram Stain   Final    ABUNDANT WBC PRESENT, PREDOMINANTLY PMN ABUNDANT GRAM POSITIVE COCCI ABUNDANT GRAM NEGATIVE RODS MODERATE GRAM POSITIVE RODS MODERATE GRAM NEGATIVE COCCI Performed at [facility], [address] [place], [place] [address]    Culture PENDING  Incomplete   Report Status PENDING  Incomplete  MRSA PCR Screening     Status: Abnormal   Collection Time: 03/30/17  8:58 PM  Result Value Ref Range Status   MRSA by PCR POSITIVE (A) NEGATIVE Final    Comment:        The GeneXpert MRSA Assay (FDA approved for NASAL specimens only), is one component of a comprehensive MRSA colonization surveillance program. It is not intended to  diagnose MRSA infection nor to guide or monitor treatment for MRSA infections. RESULT CALLED TO, READ BACK BY AND VERIFIED WITH: KARA CAMPBELL AT 2230 03/30/17.PMH     Coagulation Studies:  Recent Labs  03/30/17 1749 04/01/17 0332  LABPROT 16.3* 17.9*  INR 1.30 1.46    Urinalysis:  Recent Labs  03/31/17 1330  COLORURINE YELLOW*  LABSPEC 1.011  PHURINE 5.0  GLUCOSEU 150*  HGBUR SMALL*  BILIRUBINUR NEGATIVE  KETONESUR NEGATIVE  PROTEINUR >=300*  NITRITE NEGATIVE  LEUKOCYTESUR NEGATIVE      Imaging: Ct Abdomen Pelvis Wo Contrast  Result Date: 03/30/2017 CLINICAL DATA:  Dyspnea an acute renal failure EXAM: CT ABDOMEN AND PELVIS WITHOUT CONTRAST TECHNIQUE: Multidetector CT imaging of the abdomen and pelvis  was performed following the standard protocol without IV contrast. COMPARISON:  Radiograph 03/30/2017 FINDINGS: Lower chest: Large bilateral pleural effusions. Partial consolidations within the lower lobes may reflect atelectasis or pneumonia. Partial atelectasis in the lingula. Lower heart does not appear enlarged. Hepatobiliary: No focal hepatic abnormality. No calcified stones or biliary dilatation. Pancreas: Unremarkable. No pancreatic ductal dilatation or surrounding inflammatory changes. Spleen: Normal in size without focal abnormality. Adrenals/Urinary Tract: Adrenal glands within normal limits. Kidneys poorly visible due to large habitus and artifact which obscures the kidneys. No gross hydronephrosis is seen. Bladder poorly visible. Stomach/Bowel: The stomach is nonenlarged. No dilated small or large bowel. Centralized appearance of the bowel. Vascular/Lymphatic: Non aneurysmal aorta. Limited evaluation for adenopathy due to habitus and ascites. Reproductive: Prostate is unremarkable. Other: Negative for free air. Large amount of ascites. Large amount of fluid and edema within the subcutaneous soft tissues. Musculoskeletal: No acute or suspicious bone lesion IMPRESSION: 1. Large bilateral pleural effusions. Partial consolidations in the lingula and bilateral lower lobes may reflect atelectasis or pneumonia 2. Large amount of ascites in the abdomen and pelvis. Extensive edema and fluid within the subcutaneous soft tissues consistent with anasarca 3. Kidneys poorly visible due to large habitus an artifact. No gross hydronephrosis is seen. Bladder is poorly visible. Electronically Signed   By: Donavan Foil M.D.   On: 03/30/2017 21:03   US Renal  Result Date: 03/31/2017 CLINICAL DATA:  Evaluate for hydronephrosis. EXAM: RENAL / URINARY TRACT ULTRASOUND COMPLETE COMPARISON:  CT, 03/30/2017 FINDINGS: Right Kidney: Length: 12 cm. Mild increased parenchymal echogenicity. No mass or stone. No hydronephrosis.  Left Kidney: Length: 11.1 cm. Normal parenchymal echogenicity. Small hypoechoic lesion in the lateral midpole of the kidney measuring 12 mm, not fully characterize but likely a cyst. No other renal masses, no stones and no hydronephrosis. Bladder: Appears normal for degree of bladder distention. IMPRESSION: 1. No acute findings.  No hydronephrosis. Electronically Signed   By: Lajean Manes M.D.   On: 03/31/2017 11:34   US Venous Img Lower Bilateral  Result Date: 03/31/2017 CLINICAL DATA:  Bilateral lower extremity swelling, for 3 years but worse over the last several months. EXAM: BILATERAL LOWER EXTREMITY VENOUS DOPPLER ULTRASOUND TECHNIQUE: Gray-scale sonography with graded compression, as well as color Doppler and duplex ultrasound were performed to evaluate the lower extremity deep venous systems from the level of the common femoral vein and including the common femoral, femoral, profunda femoral, popliteal and calf veins including the posterior tibial, peroneal and gastrocnemius veins when visible. The superficial great saphenous vein was also interrogated. Spectral Doppler was utilized to evaluate flow at rest and with distal augmentation maneuvers in the common femoral, femoral and popliteal veins. COMPARISON:  None. FINDINGS: RIGHT LOWER EXTREMITY Common Femoral Vein: There  is nonocclusive thrombus in the common femoral vein extending to the sacral femoral junction. Saphenofemoral Junction: Thrombus extends from the common femoral vein to the fat for femoral junction, nonocclusive. Profunda Femoral Vein: No evidence of thrombus. Normal compressibility and flow on color Doppler imaging. Femoral Vein: No evidence of thrombus. Normal compressibility, respiratory phasicity and response to augmentation. Popliteal Vein: No evidence of thrombus. Normal compressibility, respiratory phasicity and response to augmentation. Calf Veins: No evidence of thrombus. Normal compressibility and flow on color Doppler  imaging. Superficial Great Saphenous Vein: No evidence of thrombus. Normal compressibility and flow on color Doppler imaging. Venous Reflux:  None. Other Findings:  None. LEFT LOWER EXTREMITY Common Femoral Vein: No evidence of thrombus. Normal compressibility, respiratory phasicity and response to augmentation. Saphenofemoral Junction: No evidence of thrombus. Normal compressibility and flow on color Doppler imaging. Profunda Femoral Vein: No evidence of thrombus. Normal compressibility and flow on color Doppler imaging. Femoral Vein: No evidence of thrombus. Normal compressibility, respiratory phasicity and response to augmentation. Popliteal Vein: No evidence of thrombus. Normal compressibility, respiratory phasicity and response to augmentation. Calf Veins: No evidence of thrombus. Normal compressibility and flow on color Doppler imaging. Superficial Great Saphenous Vein: No evidence of thrombus. Normal compressibility and flow on color Doppler imaging. Venous Reflux:  None. Other Findings:  None. IMPRESSION: 1. Nonocclusive thrombus in the right common femoral vein extending to the saphenous femoral junction. This may be chronic. 2. No other evidence of deep venous thrombosis in the right lower extremity. 3. No evidence of left lower extremity deep venous thrombosis. Electronically Signed   By: Amie Portland M.D.   On: 03/31/2017 11:36   Dg Chest Portable 1 View  Result Date: 03/30/2017 CLINICAL DATA:  Shortness of breath and chest pain EXAM: PORTABLE CHEST 1 VIEW COMPARISON:  None. FINDINGS: Mild cardiomegaly. Large left pleural effusion with components along the lateral left hemithorax. Associated atelectasis. Right lung is clear. IMPRESSION: Mild cardiomegaly and large left pleural effusion. Electronically Signed   By: Deatra Robinson M.D.   On: 03/30/2017 18:22     Medications:   . ceFEPime (MAXIPIME) IV Stopped (03/31/17 2158)  . vancomycin Stopped (04/01/17 0334)   . amLODipine  10 mg Oral  Daily  . apixaban  5 mg Oral BID  . atorvastatin  20 mg Oral QHS  . bisoprolol  10 mg Oral Daily  . buPROPion  100 mg Oral BID  . Chlorhexidine Gluconate Cloth  6 each Topical Q0600  . cloNIDine  0.2 mg Oral BID  . docusate sodium  100 mg Oral BID  . furosemide  80 mg Intravenous Q12H  . hydrALAZINE  50 mg Oral TID  . levothyroxine  75 mcg Oral QAC breakfast  . mouth rinse  15 mL Mouth Rinse BID  . mupirocin ointment  1 application Nasal BID   acetaminophen **OR** acetaminophen, fentaNYL (SUBLIMAZE) injection, hydrALAZINE, ondansetron **OR** ondansetron (ZOFRAN) IV, traMADol  Assessment/ Plan:  [name] is a 36 y.o. white male with hypertension, morbid obesity, pulmonary hypertension, hypothyroidism, hyperlipidemia, depression/anxiety, coronary artery disease, congestive heart failure , who was admitted to [facility] on 03/30/2017  1. Acute renal failure on chronic kidney disease stage III with nephrotic range proteinuria, hematuria and glucosuria No baseline creatinine available. Reported creatinine of 1.5? Concern for progression of chronic kidney disease to CKD stage V.  Discussed dialysis with patient. Volume overload. Anasarca and with albumin of 1.7, suspect nephrotic syndrome.  Renal ultrasound reviewed. HIV negative Not a candidate for kidney  biopsy due to body habitus - Continue IV furosemide - IV albumin - Pending  SPEP/UPEP, free light chains, viral hepatitis, ANA, ANCA, anti-GBM, PTH  2. Hypertension: elevated. With anasarca. Echocardiogram with normal systolic function 8/09. No visualization of hepatic cirrhosis on CT abd/pelvis - IV furosemide and IV albumin - amlodipine, clonidine, hydralazine and bisoprolol  3. Secondary Hyperparathyroidism with hyperphosphatemia. Corrected calcium at 9.  Pending PTH.  - Discussed phosphorus binders with patient. Start calcium acetate with meals.   4. Anemia of chronic kidney disease: hemoglobin 8.9 - Check iron studies -  Discussed EPO with patient.    LOS: [place], [name] 8/19/201812:41 PM

## 2017-04-01 NOTE — Plan of Care (Signed)
Problem: Activity: Goal: Risk for activity intolerance will decrease Outcome: Not Progressing Patient is bed bound.  Problem: Activity: Goal: Activity intolerance will improve Outcome: Not Progressing Patient is bed bound.   

## 2017-04-02 ENCOUNTER — Inpatient Hospital Stay: Payer: Medicaid Other

## 2017-04-02 ENCOUNTER — Encounter
Admission: EM | Disposition: A | Discharge status: Other Acute Inpatient Hospital | Payer: Self-pay | Source: Home / Self Care | Attending: Internal Medicine

## 2017-04-02 DIAGNOSIS — I10 Essential (primary) hypertension: Secondary | ICD-10-CM

## 2017-04-02 DIAGNOSIS — N189 Chronic kidney disease, unspecified: Secondary | ICD-10-CM

## 2017-04-02 DIAGNOSIS — J9601 Acute respiratory failure with hypoxia: Secondary | ICD-10-CM

## 2017-04-02 DIAGNOSIS — R600 Localized edema: Secondary | ICD-10-CM

## 2017-04-02 DIAGNOSIS — R0602 Shortness of breath: Secondary | ICD-10-CM

## 2017-04-02 HISTORY — PX: DIALYSIS/PERMA CATHETER INSERTION: CATH118288

## 2017-04-02 LAB — BLOOD GAS, ARTERIAL
ACID-BASE DEFICIT: 3.6 mmol/L — AB (ref 0.0–2.0)
Acid-base deficit: 7 mmol/L — ABNORMAL HIGH (ref 0.0–2.0)
BICARBONATE: 22.6 mmol/L (ref 20.0–28.0)
Bicarbonate: 23.8 mmol/L (ref 20.0–28.0)
Delivery systems: POSITIVE
Expiratory PAP: 6
FIO2: 1
FIO2: 0.5
Inspiratory PAP: 18
O2 SAT: 83.6 %
O2 SAT: 96.8 %
PATIENT TEMPERATURE: 37
PATIENT TEMPERATURE: 37
PCO2 ART: 53 mmHg — AB (ref 32.0–48.0)
PH ART: 7.26 — AB (ref 7.350–7.450)
PO2 ART: 64 mmHg — AB (ref 83.0–108.0)
PO2 ART: 101 mmHg (ref 83.0–108.0)
RATE: 12 resp/min
pCO2 arterial: 68 mmHg (ref 32.0–48.0)
pH, Arterial: 7.13 — CL (ref 7.350–7.450)

## 2017-04-02 LAB — GLUCOSE, CAPILLARY
GLUCOSE-CAPILLARY: 70 mg/dL (ref 65–99)
GLUCOSE-CAPILLARY: 108 mg/dL — AB (ref 65–99)
Glucose-Capillary: 71 mg/dL (ref 65–99)

## 2017-04-02 LAB — BASIC METABOLIC PANEL
Anion gap: 9 (ref 5–15)
BUN: 108 mg/dL — AB (ref 6–20)
CALCIUM: 7 mg/dL — AB (ref 8.9–10.3)
CO2: 25 mmol/L (ref 22–32)
CREATININE: 6.81 mg/dL — AB (ref 0.61–1.24)
Chloride: 108 mmol/L (ref 101–111)
GFR calc Af Amer: 11 mL/min — ABNORMAL LOW (ref >60)
GFR, EST NON AFRICAN AMERICAN: 10 mL/min — AB (ref >60)
Glucose, Bld: 100 mg/dL — ABNORMAL HIGH (ref 65–99)
POTASSIUM: 4.7 mmol/L (ref 3.5–5.1)
SODIUM: 142 mmol/L (ref 135–145)

## 2017-04-02 LAB — BODY FLUID CELL COUNT WITH DIFFERENTIAL
Eos, Fluid: 0 %
LYMPHS FL: 45 %
MONOCYTE-MACROPHAGE-SEROUS FLUID: 53 %
Neutrophil Count, Fluid: 2 %
Other Cells, Fluid: 0 %
WBC FLUID: 193 uL

## 2017-04-02 LAB — IRON AND TIBC: Iron: 52 ug/dL (ref 45–182)

## 2017-04-02 LAB — GLUCOSE, PLEURAL OR PERITONEAL FLUID: GLUCOSE FL: 92 mg/dL

## 2017-04-02 LAB — ALBUMIN, PLEURAL OR PERITONEAL FLUID

## 2017-04-02 LAB — LACTATE DEHYDROGENASE, PLEURAL OR PERITONEAL FLUID: LD, Fluid: 61 U/L — ABNORMAL HIGH (ref 3–23)

## 2017-04-02 LAB — PROTEIN, PLEURAL OR PERITONEAL FLUID: Total protein, fluid: <3 g/dL

## 2017-04-02 LAB — AMYLASE, PLEURAL OR PERITONEAL FLUID: AMYLASE FL: 13 U/L

## 2017-04-02 LAB — FERRITIN: FERRITIN: 438 ng/mL — AB (ref 24–336)

## 2017-04-02 LAB — TRANSFERRIN: Transferrin: <70 mg/dL — ABNORMAL LOW (ref 180–329)

## 2017-04-02 SURGERY — DIALYSIS/PERMA CATHETER INSERTION
Anesthesia: Moderate Sedation

## 2017-04-02 MED ORDER — SODIUM CHLORIDE 0.9 % IV SOLN
INTRAVENOUS | Status: DC
  Administered 2017-04-02: 14:00:00 via INTRAVENOUS

## 2017-04-02 MED ORDER — IRBESARTAN 150 MG PO TABS
75.0000 mg | ORAL_TABLET | Freq: Every day | ORAL | Status: DC
  Administered 2017-04-02 – 2017-04-05 (×4): 75 mg via ORAL
  Filled 2017-04-02 (×4): qty 1

## 2017-04-02 MED ORDER — DEXTROSE 50 % IV SOLN
1.0000 | Freq: Once | INTRAVENOUS | Status: AC
  Administered 2017-04-02: 50 mL via INTRAVENOUS

## 2017-04-02 MED ORDER — IPRATROPIUM-ALBUTEROL 0.5-2.5 (3) MG/3ML IN SOLN: 3.0000 mL | Freq: Four times a day (QID) | RESPIRATORY_TRACT | Status: DC | PRN

## 2017-04-02 MED ORDER — DEXTROSE 50 % IV SOLN
INTRAVENOUS | Status: AC
  Filled 2017-04-02: qty 50

## 2017-04-02 MED ORDER — TUBERCULIN PPD 5 UNIT/0.1ML ID SOLN
5.0000 [IU] | Freq: Once | INTRADERMAL | Status: AC
  Administered 2017-04-03: 5 [IU] via INTRADERMAL
  Filled 2017-04-02: qty 0.1

## 2017-04-02 MED ORDER — LIDOCAINE HCL (PF) 1 % IJ SOLN
INTRAMUSCULAR | Status: DC | PRN
  Administered 2017-04-02: 5 mL via INTRADERMAL

## 2017-04-02 SURGICAL SUPPLY — 3 items
KIT CATH DIALYSIS TRI 20X13 (CATHETERS) ×3 IMPLANT
KIT DIALYSIS CATH TRI 30X13 (CATHETERS) IMPLANT
PACK ANGIOGRAPHY (CUSTOM PROCEDURE TRAY) ×3 IMPLANT

## 2017-04-02 NOTE — Procedures (Signed)
Left thoracenteiss without difficulty  Complications:  None  Blood Loss: none  See dictation in canopy pacs

## 2017-04-02 NOTE — Progress Notes (Signed)
Pre hd assessment. Patient resting comfortably on bipap currently. Denies complaints currently. Denies pain.

## 2017-04-02 NOTE — Progress Notes (Signed)
Patient briefly unresponsive, NS given by dialysis RN and D50 given (cbg 70). Patient is now responsive following simple commands. Vss. Trudee Kuster

## 2017-04-02 NOTE — Progress Notes (Signed)
[facility]  ROUNDING NOTE   Subjective:   Urine output unable to be recorded. Patient states he is having some response to IV furosemide.  Creatinine further worsened to 6.81, BUN worsened to 108 Continues to have massive anasarca Urine P/C 17 gm   Objective:  Vital signs in last 24 hours:  Temp:  [97.4 F (36.3 C)-97.8 F (36.6 C)] 97.4 F (36.3 C) (08/20 0440) Pulse Rate:  [60-79] 60 (08/20 1220) Resp:  [16-20] 16 (08/20 1220) BP: (100-147)/(61-94) 110/61 (08/20 1220) SpO2:  [96 %-100 %] 98 % (08/20 1220)  Weight change:  Filed Weights   03/30/17 1736 03/30/17 2101  Weight: (!) 145.2 kg (320 lb) (!) 152.5 kg (336 lb 3.2 oz)    Intake/Output: I/O last 3 completed shifts: In: 350 [IV Piggyback:350] Out: 850 [Urine:850]   Intake/Output this shift:  Total I/O In: 0  Out: 200 [Urine:200]  Physical Exam: General: NAD, laying in bed  Head: +temporal wasting  Eyes: Anicteric,    Neck: obese  Lungs:  Diminished bilaterally  Heart: Regular rate and rhythm  Abdomen:  Soft, nontender, obese  Extremities: 3+ dependent peripheral edema, left thigh with dressings: +induration  Neurologic: Nonfocal, moving all four extremities  Skin: No lesions  Access: none    Basic Metabolic Panel:  Recent Labs Lab 03/30/17 1749 03/31/17 0408 04/01/17 0332 04/02/17 0418  NA 139 142 141 142  K 4.5 4.6 4.6 4.7  CL 105 107 107 108  CO2 22 23 24 25   GLUCOSE 90 94 92 100*  BUN 110* 102* 102* 108*  CREATININE 6.19* 6.68* 6.60* 6.81*  CALCIUM 7.3* 7.1* 7.2* 7.0*  PHOS  --   --  9.1*  --     Liver Function Tests:  Recent Labs Lab 03/30/17 1749 04/01/17 0332  AST 13* 11*  ALT 15* 13*  ALKPHOS 88 70  BILITOT 0.5 0.5  PROT 7.4 6.7  ALBUMIN 1.7* 1.6*   No results for input(s): LIPASE, AMYLASE in the last 168 hours. No results for input(s): AMMONIA in the last 168 hours.  CBC:  Recent Labs Lab 03/30/17 1749 03/31/17 0408 04/01/17 0332  WBC 11.0*  11.0* 13.0*  NEUTROABS 8.6*  --   --   HGB 9.0* 8.9* 8.9*  HCT 28.3* 26.2* 28.2*  MCV 81.7 82.3 83.1  PLT 537* 461* 501*    Cardiac Enzymes:  Recent Labs Lab 03/30/17 1749  TROPONINI <0.03    BNP: Invalid input(s): POCBNP  CBG: No results for input(s): GLUCAP in the last 168 hours.  Microbiology: Results for orders placed or performed during the hospital encounter of 03/30/17  Aerobic/Anaerobic Culture (surgical/deep wound)     Status: None (Preliminary result)   Collection Time: 03/30/17  7:56 PM  Result Value Ref Range Status   Specimen Description WOUND LEFT THIGH  Final   Special Requests NONE  Final   Gram Stain   Final    ABUNDANT WBC PRESENT, PREDOMINANTLY PMN ABUNDANT GRAM POSITIVE COCCI ABUNDANT GRAM NEGATIVE RODS MODERATE GRAM POSITIVE RODS MODERATE GRAM NEGATIVE COCCI Performed at [facility], [place] [address], [place], [place] [address]    Culture   Final    CULTURE REINCUBATED FOR BETTER GROWTH NO ANAEROBES ISOLATED; CULTURE IN PROGRESS FOR 5 DAYS    Report Status PENDING  Incomplete  MRSA PCR Screening     Status: Abnormal   Collection Time: 03/30/17  8:58 PM  Result Value Ref Range Status   MRSA by PCR POSITIVE (A)  NEGATIVE Final    Comment:        The GeneXpert MRSA Assay (FDA approved for NASAL specimens only), is one component of a comprehensive MRSA colonization surveillance program. It is not intended to diagnose MRSA infection nor to guide or monitor treatment for MRSA infections. RESULT CALLED TO, READ BACK BY AND VERIFIED WITH: KARA CAMPBELL AT 2230 03/30/17.PMH     Coagulation Studies:  Recent Labs  03/30/17 1749 04/01/17 0332  LABPROT 16.3* 17.9*  INR 1.30 1.46    Urinalysis:  Recent Labs  03/31/17 1330  COLORURINE YELLOW*  LABSPEC 1.011  PHURINE 5.0  GLUCOSEU 150*  HGBUR SMALL*  BILIRUBINUR NEGATIVE  KETONESUR NEGATIVE  PROTEINUR >=300*  NITRITE NEGATIVE  LEUKOCYTESUR NEGATIVE      Imaging: Dg  Chest 1 View  Result Date: 04/02/2017 CLINICAL DATA:  Status post left thoracentesis EXAM: CHEST 1 VIEW COMPARISON:  03/30/2017 FINDINGS: Cardiac shadow remains mildly enlarged. Moderate right pleural effusion is again seen and stable. A large left-sided pleural effusion has resolved in the interval following thoracentesis. Minimal pneumothorax is noted related to incomplete re-expansion of the lung. This should resolve without difficulty. The patient is currently asymptomatic. IMPRESSION: Resolution of left pleural effusion. Mild pneumothorax likely related to incomplete re-expansion of the left lung. Patient is currently asymptomatic in this should resolve without difficulty. Follow-up film 1 04/03/2017 is recommended for further evaluation. Stable right pleural effusion. Electronically Signed   By: Inez Catalina M.D.   On: 04/02/2017 12:16   US Thoracentesis Asp Pleural Space W/img Guide  Result Date: 04/02/2017 INDICATION: Bilateral pleural effusions EXAM: ULTRASOUND GUIDED LEFT THORACENTESIS MEDICATIONS: None. COMPLICATIONS: None immediate. PROCEDURE: An ultrasound guided thoracentesis was thoroughly discussed with the patient and questions answered. The benefits, risks, alternatives and complications were also discussed. The patient understands and wishes to proceed with the procedure. Written consent was obtained. Ultrasound was performed to localize and mark an adequate pocket of fluid in the left chest. The area was then prepped and draped in the normal sterile fashion. 1% Lidocaine was used for local anesthesia. Under ultrasound guidance, a 6 Fr Safe-T-Centesis catheter was introduced. Thoracentesis was performed. The catheter was removed and a dressing applied. FINDINGS: A total of approximately 2.8 L of clear yellow fluid was removed. Samples were sent to the laboratory as requested by the clinical team. IMPRESSION: Successful ultrasound guided left thoracentesis yielding 2.8 L of pleural fluid.  Electronically Signed   By: Inez Catalina M.D.   On: 04/02/2017 12:17     Medications:   . albumin human    . ceFEPime (MAXIPIME) IV Stopped (04/01/17 2246)  . vancomycin Stopped (04/02/17 0422)   . apixaban  5 mg Oral BID  . atorvastatin  20 mg Oral QHS  . bisoprolol  10 mg Oral Daily  . buPROPion  100 mg Oral BID  . calcium acetate  667 mg Oral TID WC  . Chlorhexidine Gluconate Cloth  6 each Topical Q0600  . cloNIDine  0.2 mg Oral BID  . collagenase   Topical Daily  . docusate sodium  100 mg Oral BID  . furosemide  80 mg Intravenous Q12H  . irbesartan  75 mg Oral QHS  . levothyroxine  75 mcg Oral QAC breakfast  . mouth rinse  15 mL Mouth Rinse BID  . mupirocin ointment  1 application Nasal BID  . tuberculin  5 Units Intradermal Once   acetaminophen **OR** acetaminophen, fentaNYL (SUBLIMAZE) injection, hydrALAZINE, ondansetron **OR** ondansetron (ZOFRAN) IV, traMADol  Assessment/ Plan:  [name] is a 36 y.o. white male with hypertension, morbid obesity, pulmonary hypertension, hypothyroidism, hyperlipidemia, depression/anxiety, coronary artery disease, congestive heart failure , who was admitted to [facility] on 03/30/2017  1. Acute renal failure on chronic kidney disease stage III with nephrotic range proteinuria, hematuria and glucosuria No baseline creatinine available. Reported creatinine of 1.3 in 2014.  Concern for progression of chronic kidney disease to CKD stage V.  Patient has undiagnosed Nephrotic syndrome. He states he has been dealing with edema for over 3 yeas but did not seek medical care Renal ultrasound report reviewed. HIV negative Not a candidate for kidney biopsy due to body habitus/ in ability to lie prone - Continue IV furosemide - IV albumin - Pending  SPEP/UPEP, free light chains, viral hepatitis, ANA, ANCA, anti-GBM, PTH - start HD today after permcath available - will probably need chronic dialysis  2. Anasarca, with HTN. Echocardiogram with  normal systolic function 1/61. No visualization of hepatic cirrhosis on CT abd/pelvis - IV furosemide and IV albumin -  Discontinue amlodipine, hydralazine - start ARB   3. Secondary Hyperparathyroidism with hyperphosphatemia.   Pending PTH.  -   calcium acetate with meals.   4. Anemia of chronic kidney disease: hemoglobin 8.9 - Check iron studies - Discussed EPO with patient.    LOS: 3 Marthann Abshier 8/20/20181:47 PM

## 2017-04-02 NOTE — OR Nursing (Signed)
On arrival to specials recovery. Pt noted to be pale, on three liters nasel cannula saturations 78%. Placed pt on non rebreather. 100%. Dr's Lorin Mercy and Lukens (radiologist who did thoracentesis two hours ago). Stat chest xray ordered. Dr Karle Starch reviewed and see report. Stat abg drawn Dr Wyn Quaker notified of results. Dr Mitzi Hansen paged.

## 2017-04-02 NOTE — OR Nursing (Signed)
Dr Mitzi Hansen informed of ABG, BIPAP ordered. Respiratory informed

## 2017-04-02 NOTE — Care Management (Addendum)
Notified Marchelle Folks with patient pathways that patient is in possible need of new HD. She will follow patient progression and work with Nephrology as well.

## 2017-04-02 NOTE — Progress Notes (Signed)
eLink Physician-Brief Progress Note Patient Name: [name] DOB: 09/08/1980 MRN: [id number]   Date of Service  04/02/2017  HPI/Events of Note  New pt eval ARF, HD cath placed in OR Needs renal replacement therapy Had thora Lung looked trap with ptx residual, NO chest tube Camera care: no distress on NIMV, last ph 7.1 TV areent great  Will assess ABG now Increase IPAP May need to switch to pc nimv  eICU Interventions       Intervention Category Evaluation Type: New Patient Evaluation  Nelda Bucks. 04/02/2017, 4:21 PM

## 2017-04-02 NOTE — Progress Notes (Signed)
Patient to [facility] for per cath placement

## 2017-04-02 NOTE — Progress Notes (Signed)
RT Assisted with patient transfer to ICU due to patient being on V60 BiPap. Patient transferred with no complications.

## 2017-04-02 NOTE — Consult Note (Signed)
[facility] Vascular Consult Note  MRN : [id number]  [name] is a 36 y.o. (1981/03/08) male who presents with chief complaint of  Chief Complaint  Patient presents with  . Acute Renal Failure  . Shortness of Breath  .  History of Present Illness: Patient is admitted to the hospital with Shortness of breath and volume overload and has developed acute renal failure. He apparently has a baseline creatinine of about 1-1/2 creatinine is now about 6. 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 The patient reports marked shortness of breath and lethargy.  The patient is critically ill with multiple ongoing issues including his respiratory insufficiency and now severe acidosis with a pH of 7.1. The nephrology service has decided to initiate dialysis at this time, and we are asked to place a temporary dialysis catheter for immediate dialysis use.  Originally, consideration for a PermCath was requested but he is not stable for the more invasive procedure at this point. He does not have fever or chills. His condition has significantly declined throughout the day but he remains alert  Current Facility-Administered Medications  Medication Dose Route Frequency Provider Last Rate Last Dose  . 0.9 %  sodium chloride infusion   Intravenous Continuous Annice Needy, MD 20 mL/hr at 04/02/17 1413    . [MAR Hold] acetaminophen (TYLENOL) tablet 650 mg  650 mg Oral Q6H PRN Enid Baas, MD       Or  . Mitzi Hansen Hold] acetaminophen (TYLENOL) suppository 650 mg  650 mg Rectal Q6H PRN Enid Baas, MD      . Mitzi Hansen Hold] apixaban Everlene Balls) tablet 5 mg  5 mg Oral BID Enid Baas, MD   5 mg at 04/01/17 2219  . [MAR Hold] atorvastatin (LIPITOR) tablet 20 mg  20 mg Oral QHS Enid Baas, MD   20 mg at 04/01/17 2218  . [MAR Hold] bisoprolol (ZEBETA) tablet 10 mg   10 mg Oral Daily Enid Baas, MD   10 mg at 04/02/17 1021  . [MAR Hold] buPROPion (WELLBUTRIN) tablet 100 mg  100 mg Oral BID Enid Baas, MD   100 mg at 04/01/17 2218  . [MAR Hold] calcium acetate (PHOSLO) capsule 667 mg  667 mg Oral TID WC Kolluru, Sarath, MD   667 mg at 04/01/17 1649  . [MAR Hold] ceFEPIme (MAXIPIME) 1 g in dextrose 5 % 50 mL IVPB  1 g Intravenous Q24H Coffee, Gerre Pebbles, [facility]   Stopped at 04/01/17 2246  . [MAR Hold] Chlorhexidine Gluconate Cloth 2 % PADS 6 each  6 each Topical Q0600 Enid Baas, MD   6 each at 04/02/17 [phone]  . [MAR Hold] cloNIDine (CATAPRES) tablet 0.2 mg  0.2 mg Oral BID Enid Baas, MD   0.2 mg at 04/02/17 1019  . [MAR Hold] collagenase (SANTYL) ointment   Topical Daily Ancil Linsey, MD      . Mitzi Hansen Hold] docusate sodium (COLACE) capsule 100 mg  100 mg Oral BID Enid Baas, MD   100 mg at 03/30/17 2156  . [MAR Hold] fentaNYL (SUBLIMAZE) injection 25 mcg  25 mcg Intravenous Q5 Min x 2 PRN Ancil Linsey, MD   25 mcg at 03/31/17 1321  . [MAR Hold] furosemide (LASIX) injection 80 mg  80 mg Intravenous Q12H Kolluru, Sarath, MD   80 mg at 04/02/17 1023  . [MAR Hold] hydrALAZINE (APRESOLINE) injection  10 mg  10 mg Intravenous Q6H PRN Enid Baas, MD   10 mg at 03/30/17 2016  . [MAR Hold] irbesartan (AVAPRO) tablet 75 mg  75 mg Oral QHS Mosetta Pigeon, MD      . Mitzi Hansen Hold] levothyroxine (SYNTHROID, LEVOTHROID) tablet 75 mcg  75 mcg Oral QAC breakfast Enid Baas, MD   75 mcg at 04/02/17 0813  . [facility] Hold] MEDLINE mouth rinse  15 mL Mouth Rinse BID Enid Baas, MD   15 mL at 04/02/17 1024  . [MAR Hold] mupirocin ointment (BACTROBAN) 2 % 1 application  1 application Nasal BID Enid Baas, MD   1 application at 04/02/17 1019  . [MAR Hold] ondansetron (ZOFRAN) tablet 4 mg  4 mg Oral Q6H PRN Enid Baas, MD       Or  . Mitzi Hansen Hold] ondansetron (ZOFRAN) injection 4 mg  4 mg Intravenous Q6H PRN  Enid Baas, MD      . Mitzi Hansen Hold] traMADol Janean Sark) tablet 50 mg  50 mg Oral Q6H PRN Enid Baas, MD   50 mg at 04/02/17 0752  . [MAR Hold] tuberculin injection 5 Units  5 Units Intradermal Once Mosetta Pigeon, MD      . Mitzi Hansen Hold] vancomycin (VANCOCIN) IVPB 1000 mg/200 mL premix  1,000 mg Intravenous Q24H Coffee, Gerre Pebbles, [facility]   Stopped at 04/02/17 0422    Past Medical History:  Diagnosis Date  . CKD (chronic kidney disease)   . Depression   . Diastolic CHF (HCC)   . Generalized anxiety disorder   . Hyperlipidemia   . Hypertension   . Hypertension   . Hypothyroidism   . Lymphedema   . Obesity   . Pulmonary hypertension (HCC)     Past Surgical History:  Procedure Laterality Date  . CARDIAC CATHETERIZATION      Social History Social History  Substance Use Topics  . Smoking status: Passive Smoke Exposure - Never Smoker    Types: Cigarettes  . Smokeless tobacco: Never Used  . Alcohol use No  No IV drug use  Family History Family History  Problem Relation Age of Onset  . Hypertension Mother   No bleeding disorders, clotting disorders, or aneurysms  No Known Allergies   REVIEW OF SYSTEMS (Negative unless checked)  Constitutional: [] Weight loss  [] Fever  [] Chills Cardiac: [] Chest pain   [] Chest pressure   [x] Palpitations   [x] Shortness of breath when laying flat   [x] Shortness of breath at rest   [] Shortness of breath with exertion. Vascular:  [] Pain in legs with walking   [] Pain in legs at rest   [] Pain in legs when laying flat   [] Claudication   [] Pain in feet when walking  [] Pain in feet at rest  [] Pain in feet when laying flat   [] History of DVT   [] Phlebitis   [x] Swelling in legs   [] Varicose veins   [x] Non-healing ulcers Pulmonary:   [] Uses home oxygen   [] Productive cough   [] Hemoptysis   [] Wheeze  [] COPD   [] Asthma Neurologic:  [] Dizziness  [] Blackouts   [] Seizures   [] History of stroke   [] History of TIA  [] Aphasia   [] Temporary blindness    [] Dysphagia   [] Weakness or numbness in arms   [] Weakness or numbness in legs Musculoskeletal:  [] Arthritis   [] Joint swelling   [] Joint pain   [] Low back pain Hematologic:  [] Easy bruising  [] Easy bleeding   [] Hypercoagulable state   [] Anemic  [] Hepatitis Gastrointestinal:  [] Blood in stool   [] Vomiting blood  [] Gastroesophageal  reflux/heartburn   [] Difficulty swallowing. Genitourinary:  [x] Chronic kidney disease   [x] Difficult urination  [] Frequent urination  [] Burning with urination   [] Blood in urine Skin:  [] Rashes   [] Ulcers   [] Wounds Psychological:  [x] History of anxiety   []  History of major depression.       Physical Examination  Vitals:   04/02/17 1159 04/02/17 1220 04/02/17 1413 04/02/17 1430  BP: 100/62 110/61 117/74   Pulse: 66 60  69  Resp:  16 13 (!) 25  Temp:   97.8 F (36.6 C)   TempSrc:   Oral   SpO2: 99% 98% (!) 80% 90%  Weight:      Height:       Body mass index is 49.65 kg/m. Gen: WD/WN. Morbidly obese and in obvious distress. Hypoxia on BiPAP with labored respirations Head: [place]/AT, No temporalis wasting Ear/Nose/Throat: Hearing grossly intact, nares w/o erythema or drainage Eyes: Sclera non-icteric, conjunctiva clear Neck: Supple, no nuchal rigidity.  No JVD.  Pulmonary:  Diminished breath sounds bilaterally worse on the left than the right, tachypnea And labored Cardiac: regular but tachycardicVascular:  Gastrointestinal: soft, non-tender/non-distended. No guarding/reflex.  Musculoskeletal: Diffusely weak.  Extremities without ischemic changes.  3+ Edema in the lower extremities bilaterally Neurologic: Awake and alert, no obvious focal deficits but now on BiPAP and difficult to assess  Psychiatric: Difficult to assess due to the severity of patient's illness. Dermatologic: No rashes or ulcers noted.   Lymph : No Cervical, Axillary, or Inguinal lymphadenopathy.      CBC Lab Results  Component Value Date   WBC 13.0 (H) 04/01/2017   HGB 8.9 (L)  04/01/2017   HCT 28.2 (L) 04/01/2017   MCV 83.1 04/01/2017   PLT 501 (H) 04/01/2017    BMET    Component Value Date/Time   NA 142 04/02/2017 0418   K 4.7 04/02/2017 0418   CL 108 04/02/2017 0418   CO2 25 04/02/2017 0418   GLUCOSE 100 (H) 04/02/2017 0418   BUN 108 (H) 04/02/2017 0418   CREATININE 6.81 (H) 04/02/2017 0418   CALCIUM 7.0 (L) 04/02/2017 0418   GFRNONAA 10 (L) 04/02/2017 0418   GFRAA 11 (L) 04/02/2017 0418   Estimated Creatinine Clearance: 22.4 mL/min (A) (by C-G formula based on SCr of 6.81 mg/dL (H)).  COAG Lab Results  Component Value Date   INR 1.46 04/01/2017   INR 1.30 03/30/2017    Radiology Ct Abdomen Pelvis Wo Contrast  Result Date: 03/30/2017 CLINICAL DATA:  Dyspnea an acute renal failure EXAM: CT ABDOMEN AND PELVIS WITHOUT CONTRAST TECHNIQUE: Multidetector CT imaging of the abdomen and pelvis was performed following the standard protocol without IV contrast. COMPARISON:  Radiograph 03/30/2017 FINDINGS: Lower chest: Large bilateral pleural effusions. Partial consolidations within the lower lobes may reflect atelectasis or pneumonia. Partial atelectasis in the lingula. Lower heart does not appear enlarged. Hepatobiliary: No focal hepatic abnormality. No calcified stones or biliary dilatation. Pancreas: Unremarkable. No pancreatic ductal dilatation or surrounding inflammatory changes. Spleen: Normal in size without focal abnormality. Adrenals/Urinary Tract: Adrenal glands within normal limits. Kidneys poorly visible due to large habitus and artifact which obscures the kidneys. No gross hydronephrosis is seen. Bladder poorly visible. Stomach/Bowel: The stomach is nonenlarged. No dilated small or large bowel. Centralized appearance of the bowel. Vascular/Lymphatic: Non aneurysmal aorta. Limited evaluation for adenopathy due to habitus and ascites. Reproductive: Prostate is unremarkable. Other: Negative for free air. Large amount of ascites. Large amount of fluid and  edema within the subcutaneous soft tissues.  Musculoskeletal: No acute or suspicious bone lesion IMPRESSION: 1. Large bilateral pleural effusions. Partial consolidations in the lingula and bilateral lower lobes may reflect atelectasis or pneumonia 2. Large amount of ascites in the abdomen and pelvis. Extensive edema and fluid within the subcutaneous soft tissues consistent with anasarca 3. Kidneys poorly visible due to large habitus an artifact. No gross hydronephrosis is seen. Bladder is poorly visible. Electronically Signed   By: Jasmine Pang M.D.   On: 03/30/2017 21:03   Dg Chest 1 View  Result Date: 04/02/2017 CLINICAL DATA:  Status post left thoracentesis EXAM: CHEST 1 VIEW COMPARISON:  03/30/2017 FINDINGS: Cardiac shadow remains mildly enlarged. Moderate right pleural effusion is again seen and stable. A large left-sided pleural effusion has resolved in the interval following thoracentesis. Minimal pneumothorax is noted related to incomplete re-expansion of the lung. This should resolve without difficulty. The patient is currently asymptomatic. IMPRESSION: Resolution of left pleural effusion. Mild pneumothorax likely related to incomplete re-expansion of the left lung. Patient is currently asymptomatic in this should resolve without difficulty. Follow-up film 1 04/03/2017 is recommended for further evaluation. Stable right pleural effusion. Electronically Signed   By: Alcide Clever M.D.   On: 04/02/2017 12:16   US Renal  Result Date: 03/31/2017 CLINICAL DATA:  Evaluate for hydronephrosis. EXAM: RENAL / URINARY TRACT ULTRASOUND COMPLETE COMPARISON:  CT, 03/30/2017 FINDINGS: Right Kidney: Length: 12 cm. Mild increased parenchymal echogenicity. No mass or stone. No hydronephrosis. Left Kidney: Length: 11.1 cm. Normal parenchymal echogenicity. Small hypoechoic lesion in the lateral midpole of the kidney measuring 12 mm, not fully characterize but likely a cyst. No other renal masses, no stones and no  hydronephrosis. Bladder: Appears normal for degree of bladder distention. IMPRESSION: 1. No acute findings.  No hydronephrosis. Electronically Signed   By: Amie Portland M.D.   On: 03/31/2017 11:34   US Venous Img Lower Bilateral  Result Date: 03/31/2017 CLINICAL DATA:  Bilateral lower extremity swelling, for 3 years but worse over the last several months. EXAM: BILATERAL LOWER EXTREMITY VENOUS DOPPLER ULTRASOUND TECHNIQUE: Gray-scale sonography with graded compression, as well as color Doppler and duplex ultrasound were performed to evaluate the lower extremity deep venous systems from the level of the common femoral vein and including the common femoral, femoral, profunda femoral, popliteal and calf veins including the posterior tibial, peroneal and gastrocnemius veins when visible. The superficial great saphenous vein was also interrogated. Spectral Doppler was utilized to evaluate flow at rest and with distal augmentation maneuvers in the common femoral, femoral and popliteal veins. COMPARISON:  None. FINDINGS: RIGHT LOWER EXTREMITY Common Femoral Vein: There is nonocclusive thrombus in the common femoral vein extending to the sacral femoral junction. Saphenofemoral Junction: Thrombus extends from the common femoral vein to the fat for femoral junction, nonocclusive. Profunda Femoral Vein: No evidence of thrombus. Normal compressibility and flow on color Doppler imaging. Femoral Vein: No evidence of thrombus. Normal compressibility, respiratory phasicity and response to augmentation. Popliteal Vein: No evidence of thrombus. Normal compressibility, respiratory phasicity and response to augmentation. Calf Veins: No evidence of thrombus. Normal compressibility and flow on color Doppler imaging. Superficial Great Saphenous Vein: No evidence of thrombus. Normal compressibility and flow on color Doppler imaging. Venous Reflux:  None. Other Findings:  None. LEFT LOWER EXTREMITY Common Femoral Vein: No evidence of  thrombus. Normal compressibility, respiratory phasicity and response to augmentation. Saphenofemoral Junction: No evidence of thrombus. Normal compressibility and flow on color Doppler imaging. Profunda Femoral Vein: No evidence of thrombus. Normal compressibility  and flow on color Doppler imaging. Femoral Vein: No evidence of thrombus. Normal compressibility, respiratory phasicity and response to augmentation. Popliteal Vein: No evidence of thrombus. Normal compressibility, respiratory phasicity and response to augmentation. Calf Veins: No evidence of thrombus. Normal compressibility and flow on color Doppler imaging. Superficial Great Saphenous Vein: No evidence of thrombus. Normal compressibility and flow on color Doppler imaging. Venous Reflux:  None. Other Findings:  None. IMPRESSION: 1. Nonocclusive thrombus in the right common femoral vein extending to the saphenous femoral junction. This may be chronic. 2. No other evidence of deep venous thrombosis in the right lower extremity. 3. No evidence of left lower extremity deep venous thrombosis. Electronically Signed   By: Amie Portland M.D.   On: 03/31/2017 11:36   Dg Chest Port 1 View  Result Date: 04/02/2017 CLINICAL DATA:  Increasing respiratory needs and hypoxia EXAM: PORTABLE CHEST 1 VIEW COMPARISON:  Film from earlier in the same day FINDINGS: Small left pneumothorax is again identified and unchanged given a change in patient positioning. Again this is felt to be related to incomplete re-expansion of the left lung. There is now however new parenchymal density identified in the mid and lower portion of the left lung consistent with re-expansion edema. Persistent large right pleural effusion is noted. Cardiac shadow is stable. Bony structures are stable. IMPRESSION: Changes of re-expansion edema within the left lung. Stable nontraumatic left pneumothorax. Electronically Signed   By: Alcide Clever M.D.   On: 04/02/2017 14:50   Dg Chest Portable 1  View  Result Date: 03/30/2017 CLINICAL DATA:  Shortness of breath and chest pain EXAM: PORTABLE CHEST 1 VIEW COMPARISON:  None. FINDINGS: Mild cardiomegaly. Large left pleural effusion with components along the lateral left hemithorax. Associated atelectasis. Right lung is clear. IMPRESSION: Mild cardiomegaly and large left pleural effusion. Electronically Signed   By: Deatra Robinson M.D.   On: 03/30/2017 18:22   US Thoracentesis Asp Pleural Space W/img Guide  Result Date: 04/02/2017 INDICATION: Bilateral pleural effusions EXAM: ULTRASOUND GUIDED LEFT THORACENTESIS MEDICATIONS: None. COMPLICATIONS: None immediate. PROCEDURE: An ultrasound guided thoracentesis was thoroughly discussed with the patient and questions answered. The benefits, risks, alternatives and complications were also discussed. The patient understands and wishes to proceed with the procedure. Written consent was obtained. Ultrasound was performed to localize and mark an adequate pocket of fluid in the left chest. The area was then prepped and draped in the normal sterile fashion. 1% Lidocaine was used for local anesthesia. Under ultrasound guidance, a 6 Fr Safe-T-Centesis catheter was introduced. Thoracentesis was performed. The catheter was removed and a dressing applied. FINDINGS: A total of approximately 2.8 L of clear yellow fluid was removed. Samples were sent to the laboratory as requested by the clinical team. IMPRESSION: Successful ultrasound guided left thoracentesis yielding 2.8 L of pleural fluid. Electronically Signed   By: Alcide Clever M.D.   On: 04/02/2017 12:17      Assessment/Plan 1. ARF on top of CKD. Now with volume overload and needs HD.  His is critically ill with a pH of 7.1 and respiratory insufficiency.  We will proceed with temporary dialysis catheter placement at this time.  Risks and benefits discussed with patient and/or family, and the catheter will be placed to allow immediate initiation of dialysis.  If the  patient's renal function does not improve throughout the hospital course, we will be happy to place a tunneled dialysis catheter for long term use prior to discharge.   2. Respiratory failure. He had  a thoracentesis today and has a pneumothorax. 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 He had almost 3 L of fluid pulled off with the thoracentesis. Volume overload in general may be part of the issue and dialysis may help. 3. Leg swelling. Worse than baseline likely due to renal insufficiency and volume overload. Leg elevation. Would benefit from compression and elevation and long-term as well as increased activity. 4. Hypertension. Stable on home medications and blood pressure control important in reducing the progression of atherosclerotic disease. On appropriate oral medications.       Festus Barren, MD  04/02/2017 3:56 PM

## 2017-04-02 NOTE — Op Note (Signed)
  OPERATIVE NOTE   PROCEDURE: 1. Ultrasound guidance for vascular access right jugular vein 2. Placement of a 20 cm triple lumen dialysis catheter right femoral vein  PRE-OPERATIVE DIAGNOSIS: 1. Renal failure 2. Respiratory failure  POST-OPERATIVE DIAGNOSIS: Same  SURGEON: Festus Barren, MD  ASSISTANT(S): None  ANESTHESIA: local  ESTIMATED BLOOD LOSS: Minimal   FINDING(S): 1. None  SPECIMEN(S): None  INDICATIONS:  Patient is a 36 y.o.male who presents with renal failure and respiratory failure. He is not well enough for a permcath.  Risks and benefits were discussed, and informed consent was obtained..  DESCRIPTION: After obtaining full informed written consent, the patient was laid flat in the bed. The right neck was sterilely prepped and draped in a sterile surgical field was created. The right jugular vein was visualized with ultrasound and found to be widely patent. It was then accessed under direct guidance without difficulty with a Seldinger needle and a permanent image was recorded. A J-wire was then placed. After skin nick and dilatation, a 20 cm triple lumen dialysis catheter was placed over the wire and the wire was removed. The lumens withdrew dark red nonpulsatile blood and flushed easily with sterile saline. The catheter was secured to the skin with 3 nylon sutures. Sterile dressing was placed.  COMPLICATIONS: None  CONDITION: Stable  Festus Barren 04/02/2017 3:54 PM  This note was created with Dragon Medical transcription system. Any errors in dictation are purely unintentional.

## 2017-04-02 NOTE — Progress Notes (Addendum)
[employer] - [place] at [facility]   PATIENT NAME: [name]    MR#:  [id number]  DATE OF BIRTH:  01/09/1981  SUBJECTIVE:    No acute issues overnight Shortness of breathabout the same  REVIEW OF SYSTEMS:    Review of Systems  Constitutional: Negative for fever, chills weight loss Has some tremors occasionally Positive generalized weakness  HENT: Negative for ear pain, nosebleeds, congestion, facial swelling, rhinorrhea, neck pain, neck stiffness and ear discharge.   Respiratory: Negative for cough, ++shortness of breath, no wheezing  Cardiovascular: Negative for chest pain, palpitations and +++leg swelling. anasarca Gastrointestinal: Negative for heartburn, abdominal pain, vomiting, diarrhea or consitpation Genitourinary: Negative for dysuria, urgency, frequency, hematuria Musculoskeletal: Negative for back pain or joint pain Neurological: Negative for dizziness, seizures, syncope, focal weakness,  numbness and headaches.  Hematological: Does not bruise/bleed easily.  Psychiatric/Behavioral: Negative for hallucinations, confusion, dysphoric mood Skin: left thigh wound covered Appetite is poor  Tolerating Diet: poor appetite     DRUG ALLERGIES:  No Known Allergies  VITALS:  Blood pressure 129/89, pulse 74, temperature (!) 97.4 F (36.3 C), temperature source Oral, resp. rate 20, height 5\' 9"  (1.753 m), weight (!) 152.5 kg (336 lb 3.2 oz), SpO2 96 %.  PHYSICAL EXAMINATION:  Constitutional: Appears obese anasarca. NAD HENT: Normocephalic. [name] Oropharynx is clear and moist.  Eyes: Conjunctivae and EOM are normal. PERRLA, no scleral icterus.  Neck: Normal ROM. Neck supple. No JVD. No tracheal deviation. CVS: RRR, S1/S2 +, no murmurs, no gallops, no carotid bruit.  Pulmonary: Effort and breath sounds normal, no stridor, rhonchi, wheezes, rales.  Abdominal: abdominal wall edema  BS + no rebound guarding Musculoskeletal: . +annasarca no tenderness.  Neuro:  Alert. CN 2-12 grossly intact. No focal deficits. Skin:left thigh covered Psychiatric: Normal mood and affect.      LABORATORY PANEL:   CBC  Recent Labs Lab 04/01/17 0332  WBC 13.0*  HGB 8.9*  HCT 28.2*  PLT 501*   ------------------------------------------------------------------------------------------------------------------  Chemistries   Recent Labs Lab 04/01/17 0332 04/02/17 0418  NA 141 142  K 4.6 4.7  CL 107 108  CO2 24 25  GLUCOSE 92 100*  BUN 102* 108*  CREATININE 6.60* 6.81*  CALCIUM 7.2* 7.0*  AST 11*  --   ALT 13*  --   ALKPHOS 70  --   BILITOT 0.5  --    ------------------------------------------------------------------------------------------------------------------  Cardiac Enzymes  Recent Labs Lab 03/30/17 1749  TROPONINI <0.03   ------------------------------------------------------------------------------------------------------------------  RADIOLOGY:  US Renal  Result Date: 03/31/2017 CLINICAL DATA:  Evaluate for hydronephrosis. EXAM: RENAL / URINARY TRACT ULTRASOUND COMPLETE COMPARISON:  CT, 03/30/2017 FINDINGS: Right Kidney: Length: 12 cm. Mild increased parenchymal echogenicity. No mass or stone. No hydronephrosis. Left Kidney: Length: 11.1 cm. Normal parenchymal echogenicity. Small hypoechoic lesion in the lateral midpole of the kidney measuring 12 mm, not fully characterize but likely a cyst. No other renal masses, no stones and no hydronephrosis. Bladder: Appears normal for degree of bladder distention. IMPRESSION: 1. No acute findings.  No hydronephrosis. Electronically Signed   By: Amie Portland M.D.   On: 03/31/2017 11:34   US Venous Img Lower Bilateral  Result Date: 03/31/2017 CLINICAL DATA:  Bilateral lower extremity swelling, for 3 years but worse over the last several months. EXAM: BILATERAL LOWER EXTREMITY VENOUS DOPPLER ULTRASOUND TECHNIQUE: Gray-scale sonography with graded compression, as well as color Doppler and duplex  ultrasound were performed to evaluate the lower extremity deep venous systems from the level  of the common femoral vein and including the common femoral, femoral, profunda femoral, popliteal and calf veins including the posterior tibial, peroneal and gastrocnemius veins when visible. The superficial great saphenous vein was also interrogated. Spectral Doppler was utilized to evaluate flow at rest and with distal augmentation maneuvers in the common femoral, femoral and popliteal veins. COMPARISON:  None. FINDINGS: RIGHT LOWER EXTREMITY Common Femoral Vein: There is nonocclusive thrombus in the common femoral vein extending to the sacral femoral junction. Saphenofemoral Junction: Thrombus extends from the common femoral vein to the fat for femoral junction, nonocclusive. Profunda Femoral Vein: No evidence of thrombus. Normal compressibility and flow on color Doppler imaging. Femoral Vein: No evidence of thrombus. Normal compressibility, respiratory phasicity and response to augmentation. Popliteal Vein: No evidence of thrombus. Normal compressibility, respiratory phasicity and response to augmentation. Calf Veins: No evidence of thrombus. Normal compressibility and flow on color Doppler imaging. Superficial Great Saphenous Vein: No evidence of thrombus. Normal compressibility and flow on color Doppler imaging. Venous Reflux:  None. Other Findings:  None. LEFT LOWER EXTREMITY Common Femoral Vein: No evidence of thrombus. Normal compressibility, respiratory phasicity and response to augmentation. Saphenofemoral Junction: No evidence of thrombus. Normal compressibility and flow on color Doppler imaging. Profunda Femoral Vein: No evidence of thrombus. Normal compressibility and flow on color Doppler imaging. Femoral Vein: No evidence of thrombus. Normal compressibility, respiratory phasicity and response to augmentation. Popliteal Vein: No evidence of thrombus. Normal compressibility, respiratory phasicity and response  to augmentation. Calf Veins: No evidence of thrombus. Normal compressibility and flow on color Doppler imaging. Superficial Great Saphenous Vein: No evidence of thrombus. Normal compressibility and flow on color Doppler imaging. Venous Reflux:  None. Other Findings:  None. IMPRESSION: 1. Nonocclusive thrombus in the right common femoral vein extending to the saphenous femoral junction. This may be chronic. 2. No other evidence of deep venous thrombosis in the right lower extremity. 3. No evidence of left lower extremity deep venous thrombosis. Electronically Signed   By: Amie Portland M.D.   On: 03/31/2017 11:36     ASSESSMENT AND PLAN:   36 year old male with history of chronic diastolic heart failure, primary pulmonary hypertension and hypothyroidism with chronic kidney disease stage III who presents for [facility] due to shortness of breath.  1. Acute on chronic kidney failure possibly due to diuretics Nephrology consult appreciated. Renal ultrasound showed no hydronephrosis Patient will  need to start hemodialysis due to fluid overload Plan for HD cath placement by vascular  2. Shortness of breath due to Large left pleural effusion with history of diastolic heart failure and pulmonary hypertension and anasarca Patient will start hemodialysis  Echocardiogram shows normal ejection fraction without major valvular abnormalities U/s guideded paracentesis ordered  3. Left thigh wound:  Status post bedside debridement day #2 Follow-up wound cultures Wound care consult.Marland KitchenPMN seen on wound cx Continue vancomycin and cefepime Pressure offloading Daily dressing changes with Santyl  4. Essential hypertension: Continue hydralazine, Norvasc, clonidine and Bisoprolol 5. Hypothyroid: Continue Synthroid 6. Hyperlipidemia: Continue statin 7. Chronic diastolic heart failure with anasarca/chronic lower extremity edema and acute kidney injury: Patient will need to start dialysis Management  plans discussed with the patient and he is in agreement.  CODE STATUS: full  TOTAL TIME TAKING CARE OF THIS PATIENT: 21 minutes.     POSSIBLE D/C 4-6 days, DEPENDING ON CLINICAL CONDITION.   Calab Sachse M.D on 04/02/2017 at 9:01 AM  Between 7am to 6pm - Pager - [phone] After 6pm go to [email or web address] -  password [email or web address] [place]  Office  [phone]  CC: Primary care physician; Patient, No Pcp Per  Note: This dictation was prepared with Dragon dictation along with smaller phrase technology. Any transcriptional errors that result from this process are unintentional.

## 2017-04-02 NOTE — Progress Notes (Signed)
HD initiated at bedside in ICU. Patient currently on bipap. sats low 90s. Procedure explained. Patient currently has no further questions about HD.

## 2017-04-02 NOTE — Progress Notes (Signed)
Pre hd 

## 2017-04-02 NOTE — Progress Notes (Signed)
Report given to Vilinda Blanks, RN. Mother called and updated about what room pt is in now in ICU.

## 2017-04-02 NOTE — OR Nursing (Signed)
2C nurse notified of change in patient status and plans to send him to stepdown unit. Dialysis nurse informed.

## 2017-04-02 NOTE — Progress Notes (Signed)
HD stopped with 2 minutes remaining. Patient became unresponsive. Head of bed immediately down and saline started. After approx 300 cc saline patient became more responsive. ICU team at bedside. Dialysis was discontinued and Dr. Thedore Mins notified. Vitals remain stable. Primary RN giving D50 currently. Patient responsive. BP 111/81, HR 65, RR 95% on bipap.

## 2017-04-02 NOTE — Consult Note (Signed)
Name: [name] MRN: [id number] DOB: 1981/02/18    ADMISSION DATE:  03/30/2017 CONSULTATION DATE: 04/02/2017  REFERRING MD : Dr. Wyn Quaker  CHIEF COMPLAINT: Acute Respiratory Failure and Acute Renal Failure  BRIEF PATIENT DESCRIPTION:  36 year old male admitted on 08/17 with acute on chronic respiratory failure secondary to large left pleural effusion and OHS and worsening renal failure requiring transfer to the Stepdown Unit 08/20 for continuous Bipap  SIGNIFICANT EVENTS  08/17-Pt admitted to Medsurg Unit  08/20-Pt transferred to [facility] Unit s/p left sided thoracentesis and placement of temporary dialysis catheter placement  STUDIES:  CT Abd Pelvis08/17>>Large bilateral pleural effusions. Partial consolidations in the lingula and bilateral lower lobes may reflect atelectasis or Pneumonia Large amount of ascites in the abdomen and pelvis. Extensive edema and fluid within the subcutaneous soft tissues consistent with anasarca Kidneys poorly visible due to large habitus an artifact. No gross hydronephrosis is seen. Bladder is poorly visible. US Renal 08/18>>No acute findings.  No hydronephrosis US Venous Bilateral Lower Ext 08/18>>Nonocclusive thrombus in the right common femoral vein extending to the saphenous femoral junction. This may be chronic. No other evidence of deep venous thrombosis in the right lower extremity. No evidence of left lower extremity deep venous thrombosis. Echo 08/18>>EF 50%-55%  HISTORY OF PRESENT ILLNESS:   This is a 36 year old male with a past medical history of depression, diastolic CHF, hypertension, hypothyroidism, bilateral lipidemia, obesity, CKD stage III, anxiety, pulmonary hypertension, and obesity.  He presented to [facility] ER 08/17 from [facility] with complaints of shortness of breath and worsening renal failure. Per ER notes the patient normally wears 2L of O2 via nasal canula continuously and had to increase home O2 to 3L due to symptoms.  At  baseline he is primarily wheelchair bound and occasionally ambulates with a walker. In the ER creatinine 6.19 and cxr revealed a large left pleural effusion. Therefore, he was admitted to the [facility] unit by hospitalist team for further workup and treatment. Due to a large necrotic pressure ulcer on the left thigh he required sharp excisional debridement of the wound by vascular surgery on 08/18. He also had a left sided thoracentesis on 08/20 with removal of 2.8L of pleural fluid and post thoracentesis he developed a mild left pneumothorax with respiratory failure requiring Bipap, therefore he was transferred to the [facility] Unit and PCCM consulted.    PAST MEDICAL HISTORY :   has a past medical history of CKD (chronic kidney disease); Depression; Diastolic CHF (HCC); Generalized anxiety disorder; Hyperlipidemia; Hypertension; Hypertension; Hypothyroidism; Lymphedema; Obesity; and Pulmonary hypertension (HCC).  has a past surgical history that includes Cardiac catheterization. Prior to Admission medications   Medication Sig Start Date End Date Taking? Authorizing Provider  amLODipine (NORVASC) 10 MG tablet Take 10 mg by mouth daily.   Yes [provider]  amLODipine (NORVASC) 5 MG tablet Take 5 mg by mouth daily.   Yes [provider]  apixaban (ELIQUIS) 5 MG TABS tablet Take 5 mg by mouth 2 (two) times daily.   Yes [provider]  aspirin 325 MG EC tablet Take 325 mg by mouth daily.   Yes [provider]  atorvastatin (LIPITOR) 20 MG tablet Take 20 mg by mouth at bedtime.   Yes [provider]  bisoprolol (ZEBETA) 10 MG tablet Take 10 mg by mouth daily.   Yes [provider]  buPROPion (WELLBUTRIN) 100 MG tablet Take 100 mg by mouth 2 (two) times daily.   Yes [provider]  cloNIDine (  CATAPRES) 0.2 MG tablet Take 0.2 mg by mouth 2 (two) times daily.   Yes [provider]  collagenase (SANTYL) ointment Apply 1 application  topically daily.   Yes [provider]  hydrALAZINE (APRESOLINE) 50 MG tablet Take 50 mg by mouth 3 (three) times daily.   Yes [provider]  levothyroxine (SYNTHROID, LEVOTHROID) 75 MCG tablet Take 75 mcg by mouth daily before breakfast.   Yes [provider]  metolazone (ZAROXOLYN) 2.5 MG tablet Take 2.5 mg by mouth every other day.   Yes [provider]  nystatin (MYCOSTATIN/NYSTOP) powder Apply topically 2 (two) times daily.   Yes [provider]  potassium chloride SA (K-DUR,KLOR-CON) 20 MEQ tablet Take 20 mEq by mouth every other day.   Yes [provider]  ondansetron (ZOFRAN) 8 MG tablet Take by mouth every 8 (eight) hours as needed for nausea or vomiting.    [provider]  traMADol (ULTRAM) 50 MG tablet Take 50 mg by mouth every 8 (eight) hours as needed.    [provider]   No Known Allergies  FAMILY HISTORY:  family history includes Hypertension in his mother. SOCIAL HISTORY:  reports that he is a non-smoker but has been exposed to tobacco smoke. He has never used smokeless tobacco. He reports that he does not drink alcohol.  REVIEW OF SYSTEMS: Positives in BOLD  Constitutional: Negative for fever, chills, weight loss, malaise/fatigue and diaphoresis.  HENT: Negative for hearing loss, ear pain, nosebleeds, congestion, sore throat, neck pain, tinnitus and ear discharge.   Eyes: Negative for blurred vision, double vision, photophobia, pain, discharge and redness.  Respiratory: cough, hemoptysis, sputum production, shortness of breath, wheezing and stridor.   Cardiovascular: Negative for chest pain, palpitations, orthopnea, claudication, leg swelling and PND.  Gastrointestinal: Negative for heartburn, nausea, vomiting, abdominal pain, diarrhea, constipation, blood in stool and melena.  Genitourinary: hesitancy, dysuria, urgency, frequency, hematuria and flank pain.  Musculoskeletal: Negative for myalgias,  back pain, joint pain and falls.  Skin: Negative for itching and rash.  Neurological: Negative for dizziness, tingling, tremors, sensory change, speech change, focal weakness, seizures, loss of consciousness, weakness and headaches.  Endo/Heme/Allergies: Negative for environmental allergies and polydipsia. Does not bruise/bleed easily.  SUBJECTIVE:  Patient asking for Bipap to be removed.   VITAL SIGNS: Temp:  [96.2 F (35.7 C)-97.8 F (36.6 C)] 96.2 F (35.7 C) (08/20 1606) Pulse Rate:  [60-83] 65 (08/20 1855) Resp:  [8-30] 10 (08/20 1855) BP: (100-147)/(61-94) 107/69 (08/20 1855) SpO2:  [80 %-100 %] 96 % (08/20 1855) FiO2 (%):  [50 %] 50 % (08/20 1527) Weight:  [147.5 kg (325 lb 2.9 oz)-149.1 kg (328 lb 11.3 oz)] 147.5 kg (325 lb 2.9 oz) (08/20 1855)  PHYSICAL EXAMINATION: General: well developed, well nourished, obese male NAD  Neuro: alert and oriented, follows commands HEENT: supple, no JVD Cardiovascular: nsr, s1s2, no M/R/G Lungs: diminished throughout, even, non labored on Bipap Abdomen: +BS x4, obese, soft, non tender, non distended Musculoskeletal: bilateral lower extremity lymphoedema, 2+ bilateral lower extremity pitting edema  Skin: left thigh dressing dry and intact    Recent Labs Lab 03/31/17 0408 04/01/17 0332 04/02/17 0418  NA 142 141 142  K 4.6 4.6 4.7  CL 107 107 108  CO2 23 24 25   BUN 102* 102* 108*  CREATININE 6.68* 6.60* 6.81*  GLUCOSE 94 92 100*    Recent Labs Lab 03/30/17 1749 03/31/17 0408 04/01/17 0332  HGB 9.0* 8.9* 8.9*  HCT 28.3* 26.2* 28.2*  WBC 11.0* 11.0* 13.0*  PLT 537* 461* 501*   Dg Chest 1 View  Result Date: 04/02/2017 CLINICAL DATA:  Status post left thoracentesis EXAM: CHEST 1 VIEW COMPARISON:  03/30/2017 FINDINGS: Cardiac shadow remains mildly enlarged. Moderate right pleural effusion is again seen and stable. A large left-sided pleural effusion has resolved in the interval following thoracentesis. Minimal pneumothorax is  noted related to incomplete re-expansion of the lung. This should resolve without difficulty. The patient is currently asymptomatic. IMPRESSION: Resolution of left pleural effusion. Mild pneumothorax likely related to incomplete re-expansion of the left lung. Patient is currently asymptomatic in this should resolve without difficulty. Follow-up film 1 04/03/2017 is recommended for further evaluation. Stable right pleural effusion. Electronically Signed   By: Alcide Clever M.D.   On: 04/02/2017 12:16   Dg Chest Port 1 View  Result Date: 04/02/2017 CLINICAL DATA:  Increasing respiratory needs and hypoxia EXAM: PORTABLE CHEST 1 VIEW COMPARISON:  Film from earlier in the same day FINDINGS: Small left pneumothorax is again identified and unchanged given a change in patient positioning. Again this is felt to be related to incomplete re-expansion of the left lung. There is now however new parenchymal density identified in the mid and lower portion of the left lung consistent with re-expansion edema. Persistent large right pleural effusion is noted. Cardiac shadow is stable. Bony structures are stable. IMPRESSION: Changes of re-expansion edema within the left lung. Stable nontraumatic left pneumothorax. Electronically Signed   By: Alcide Clever M.D.   On: 04/02/2017 14:50   US Thoracentesis Asp Pleural Space W/img Guide  Result Date: 04/02/2017 INDICATION: Bilateral pleural effusions EXAM: ULTRASOUND GUIDED LEFT THORACENTESIS MEDICATIONS: None. COMPLICATIONS: None immediate. PROCEDURE: An ultrasound guided thoracentesis was thoroughly discussed with the patient and questions answered. The benefits, risks, alternatives and complications were also discussed. The patient understands and wishes to proceed with the procedure. Written consent was obtained. Ultrasound was performed to localize and mark an adequate pocket of fluid in the left chest. The area was then prepped and draped in the normal sterile fashion. 1%  Lidocaine was used for local anesthesia. Under ultrasound guidance, a 6 Fr Safe-T-Centesis catheter was introduced. Thoracentesis was performed. The catheter was removed and a dressing applied. FINDINGS: A total of approximately 2.8 L of clear yellow fluid was removed. Samples were sent to the laboratory as requested by the clinical team. IMPRESSION: Successful ultrasound guided left thoracentesis yielding 2.8 L of pleural fluid. Electronically Signed   By: Alcide Clever M.D.   On: 04/02/2017 12:17    ASSESSMENT / PLAN: Acute on chronic hypoxic hypercapnic respiratory failure secondary to OHS and large left pleural effusion s/p left sided thoracentesis 04/02/2017) Acute on chronic renal failure with massive anasarca  HTN Hx: Obesity, pulmonary hypotension, and chronic systolic CHF P: Continue BiPAP for now wean as tolerated Maintain O2 sats greater than 92% Prn bronchodilator therapy Repeat chest x-ray in the a.m. Continue IV Lasix Nephrology consulted appreciate input-HD per recommendations Continue eliquis, atorvastatin, clonidine, and prn hydralazine Continue abx  Follow cultures  Sonda Rumble, AGNP  Pulmonary/Critical Care Pager [phone] (please enter 7 digits) PCCM Consult Pager [phone] (please enter 7 digits)

## 2017-04-03 ENCOUNTER — Encounter: Payer: Self-pay | Admitting: Vascular Surgery

## 2017-04-03 ENCOUNTER — Inpatient Hospital Stay: Payer: Medicaid Other

## 2017-04-03 ENCOUNTER — Encounter (INDEPENDENT_AMBULATORY_CARE_PROVIDER_SITE_OTHER): Payer: Medicaid Other | Admitting: Vascular Surgery

## 2017-04-03 DIAGNOSIS — J90 Pleural effusion, not elsewhere classified: Secondary | ICD-10-CM

## 2017-04-03 DIAGNOSIS — J9622 Acute and chronic respiratory failure with hypercapnia: Secondary | ICD-10-CM

## 2017-04-03 DIAGNOSIS — J95811 Postprocedural pneumothorax: Secondary | ICD-10-CM

## 2017-04-03 DIAGNOSIS — J9621 Acute and chronic respiratory failure with hypoxia: Secondary | ICD-10-CM

## 2017-04-03 LAB — BASIC METABOLIC PANEL
Anion gap: 9 (ref 5–15)
BUN: 97 mg/dL — AB (ref 6–20)
CALCIUM: 6.7 mg/dL — AB (ref 8.9–10.3)
CHLORIDE: 107 mmol/L (ref 101–111)
CO2: 26 mmol/L (ref 22–32)
CREATININE: 6.38 mg/dL — AB (ref 0.61–1.24)
GFR, EST AFRICAN AMERICAN: 12 mL/min — AB (ref >60)
GFR, EST NON AFRICAN AMERICAN: 10 mL/min — AB (ref >60)
Glucose, Bld: 108 mg/dL — ABNORMAL HIGH (ref 65–99)
Potassium: 4.4 mmol/L (ref 3.5–5.1)
SODIUM: 142 mmol/L (ref 135–145)

## 2017-04-03 LAB — HEPATITIS B SURFACE ANTIBODY,QUALITATIVE: Hep B S Ab: REACTIVE

## 2017-04-03 LAB — PROTEIN ELECTROPHORESIS, SERUM
A/G RATIO SPE: 0.4 — AB (ref 0.7–1.7)
ALPHA-1-GLOBULIN: 0.4 g/dL (ref 0.0–0.4)
ALPHA-2-GLOBULIN: 1.2 g/dL — AB (ref 0.4–1.0)
Albumin ELP: 1.7 g/dL — ABNORMAL LOW (ref 2.9–4.4)
Beta Globulin: 1 g/dL (ref 0.7–1.3)
GLOBULIN, TOTAL: 4.4 g/dL — AB (ref 2.2–3.9)
Gamma Globulin: 1.8 g/dL (ref 0.4–1.8)
TOTAL PROTEIN ELP: 6.1 g/dL (ref 6.0–8.5)

## 2017-04-03 LAB — GLUCOSE, CAPILLARY
GLUCOSE-CAPILLARY: 96 mg/dL (ref 65–99)
GLUCOSE-CAPILLARY: 83 mg/dL (ref 65–99)
GLUCOSE-CAPILLARY: 91 mg/dL (ref 65–99)
Glucose-Capillary: 93 mg/dL (ref 65–99)

## 2017-04-03 LAB — CBC
HCT: 26 % — ABNORMAL LOW (ref 40.0–52.0)
Hemoglobin: 8.3 g/dL — ABNORMAL LOW (ref 13.0–18.0)
MCH: 26.2 pg (ref 26.0–34.0)
MCHC: 32 g/dL (ref 32.0–36.0)
MCV: 81.8 fL (ref 80.0–100.0)
PLATELETS: 433 10*3/uL (ref 150–440)
RBC: 3.18 MIL/uL — AB (ref 4.40–5.90)
RDW: 15.3 % — AB (ref 11.5–14.5)
WBC: 12.5 10*3/uL — ABNORMAL HIGH (ref 3.8–10.6)

## 2017-04-03 LAB — KAPPA/LAMBDA LIGHT CHAINS
KAPPA FREE LGHT CHN: 252 mg/L — AB (ref 3.3–19.4)
KAPPA, LAMDA LIGHT CHAIN RATIO: 1.33 (ref 0.26–1.65)
LAMDA FREE LIGHT CHAINS: 188.9 mg/L — AB (ref 5.7–26.3)

## 2017-04-03 LAB — HEPATITIS B CORE ANTIBODY, TOTAL: HEP B C TOTAL AB: NEGATIVE

## 2017-04-03 LAB — HEPATITIS B SURFACE ANTIBODY, QUANTITATIVE

## 2017-04-03 LAB — HEPATITIS B SURFACE ANTIGEN
HEP B S AG: NEGATIVE
HEP B S AG: NEGATIVE

## 2017-04-03 LAB — HIV ANTIBODY (ROUTINE TESTING W REFLEX): HIV Screen 4th Generation wRfx: NONREACTIVE

## 2017-04-03 LAB — PARATHYROID HORMONE, INTACT (NO CA): PTH: 60 pg/mL (ref 15–65)

## 2017-04-03 LAB — HEPATITIS B CORE ANTIBODY, IGM: HEP B C IGM: NEGATIVE

## 2017-04-03 LAB — MISC LABCORP TEST (SEND OUT): Labcorp test code: 19588

## 2017-04-03 LAB — HEPATITIS C ANTIBODY: HCV Ab: 0.1 s/co ratio (ref 0.0–0.9)

## 2017-04-03 LAB — ANA W/REFLEX: Anti Nuclear Antibody(ANA): NEGATIVE

## 2017-04-03 MED ORDER — ALBUMIN HUMAN 25 % IV SOLN
12.5000 g | Freq: Once | INTRAVENOUS | Status: AC
  Administered 2017-04-03: 12.5 g via INTRAVENOUS
  Filled 2017-04-03: qty 50

## 2017-04-03 MED ORDER — NEPRO/CARBSTEADY PO LIQD
237.0000 mL | Freq: Three times a day (TID) | ORAL | Status: DC
  Administered 2017-04-03 – 2017-04-16 (×23): 237 mL via ORAL

## 2017-04-03 MED ORDER — RENA-VITE PO TABS
1.0000 | ORAL_TABLET | Freq: Every day | ORAL | Status: DC
  Administered 2017-04-03 – 2017-04-15 (×11): 1 via ORAL
  Filled 2017-04-03 (×11): qty 1

## 2017-04-03 MED ORDER — VANCOMYCIN HCL IN DEXTROSE 1-5 GM/200ML-% IV SOLN: 1000.0000 mg | INTRAVENOUS | Status: DC | PRN

## 2017-04-03 MED ORDER — FERROUS SULFATE 325 (65 FE) MG PO TABS
325.0000 mg | ORAL_TABLET | Freq: Every day | ORAL | Status: DC
  Administered 2017-04-03 – 2017-04-16 (×10): 325 mg via ORAL
  Filled 2017-04-03 (×9): qty 1

## 2017-04-03 NOTE — Progress Notes (Signed)
Hd start, 2nd hd tx, education complete, pt alert, no c/o, stable

## 2017-04-03 NOTE — Progress Notes (Signed)
[facility] - [place] at [facility]   PATIENT NAME: [name]    MR#:  [id number]  DATE OF BIRTH:  12/05/1980  SUBJECTIVE:    Patient had thoracentesis yesterday and had small pneumothorax.  He was found to be severely hypoxic while in specials waiting to get dialysis catheter. He did have placement of dialysis catheter. He had 2.8 L of pleural fluid removed. He was placed on BiPAP and now is on nasal cannula  REVIEW OF SYSTEMS:    Review of Systems  Constitutional: Negative for fever, chills weight loss  Positive generalized weakness  HENT: Negative for ear pain, nosebleeds, congestion, facial swelling, rhinorrhea, neck pain, neck stiffness and ear discharge.   Respiratory: Negative for cough, ++shortness of breath, no wheezing  Cardiovascular: Negative for chest pain, palpitations and positive leg swelling.  Gastrointestinal: Negative for heartburn, abdominal pain, vomiting, diarrhea or consitpation Genitourinary: Negative for dysuria, urgency, frequency, hematuria Musculoskeletal: Negative for back pain or joint pain Neurological: Negative for dizziness, seizures, syncope, focal weakness,  numbness and headaches.  Hematological: Does not bruise/bleed easily.  Psychiatric/Behavioral: Negative for hallucinations, confusion, dysphoric mood Skin: left thigh wound covered   Tolerating Diet: yes    DRUG ALLERGIES:  No Known Allergies  VITALS:  Blood pressure 103/66, pulse (!) 59, temperature 97.6 F (36.4 C), temperature source Oral, resp. rate 10, height 5\' 9"  (1.753 m), weight (!) 147.5 kg (325 lb 2.9 oz), SpO2 99 %.  PHYSICAL EXAMINATION:  Constitutional: Appears obese anasarca. NAD HENT: Normocephalic. [name] Oropharynx is clear and moist.  Eyes: Conjunctivae and EOM are normal. PERRLA, no scleral icterus.  Neck: Normal ROM. Neck supple. No JVD. No tracheal deviation. CVS: RRR, S1/S2 +, no murmurs, no gallops, no carotid bruit.  Pulmonary: Decreased  breath sounds with scant bilateral wheezing  Abdominal: abdominal wall edema  Due to body habitus unable to appreciate for organomegaly BS + no rebound guarding Musculoskeletal: Normal range of motion. +anasarca no tenderness.  Neuro: Alert. CN 2-12 grossly intact. No focal deficits. Skin:left thigh covered wound   Right femoral dialysis catheter placed Psychiatric: Normal mood and affect.      LABORATORY PANEL:   CBC  Recent Labs Lab 04/03/17 0351  WBC 12.5*  HGB 8.3*  HCT 26.0*  PLT 433   ------------------------------------------------------------------------------------------------------------------  Chemistries   Recent Labs Lab 04/01/17 0332  04/03/17 0351  NA 141  < > 142  K 4.6  < > 4.4  CL 107  < > 107  CO2 24  < > 26  GLUCOSE 92  < > 108*  BUN 102*  < > 97*  CREATININE 6.60*  < > 6.38*  CALCIUM 7.2*  < > 6.7*  AST 11*  --   --   ALT 13*  --   --   ALKPHOS 70  --   --   BILITOT 0.5  --   --   < > = values in this interval not displayed. ------------------------------------------------------------------------------------------------------------------  Cardiac Enzymes  Recent Labs Lab 03/30/17 1749  TROPONINI <0.03   ------------------------------------------------------------------------------------------------------------------  RADIOLOGY:  Dg Chest 1 View  Result Date: 04/02/2017 CLINICAL DATA:  Status post left thoracentesis EXAM: CHEST 1 VIEW COMPARISON:  03/30/2017 FINDINGS: Cardiac shadow remains mildly enlarged. Moderate right pleural effusion is again seen and stable. A large left-sided pleural effusion has resolved in the interval following thoracentesis. Minimal pneumothorax is noted related to incomplete re-expansion of the lung. This should resolve without difficulty. The patient is currently asymptomatic. IMPRESSION: Resolution  of left pleural effusion. Mild pneumothorax likely related to incomplete re-expansion of the left lung.  Patient is currently asymptomatic in this should resolve without difficulty. Follow-up film 1 04/03/2017 is recommended for further evaluation. Stable right pleural effusion. Electronically Signed   By: Alcide Clever M.D.   On: 04/02/2017 12:16   Dg Chest Port 1 View  Result Date: 04/03/2017 CLINICAL DATA:  Respiratory failure. EXAM: PORTABLE CHEST 1 VIEW COMPARISON:  04/02/2017. FINDINGS: Right IJ line in stable position. Heart size stable. Diffuse bilateral pulmonary interstitial prominence and bilateral pleural effusions, particular prominent on right again noted suggesting CHF. Left-sided pneumothorax again noted. Slight progression from prior exam cannot be excluded. No acute bony abnormality. IMPRESSION: 1. Left-sided pneumothorax again noted. Slight progression from prior exam cannot be excluded. 2. Diffuse bilateral interstitial prominence and bilateral pleural effusions, particularly prominent on the right again noted. 3. Right IJ line in unchanged position. Electronically Signed   By: Maisie Fus  Register   On: 04/03/2017 06:07   Dg Chest Port 1 View  Result Date: 04/02/2017 CLINICAL DATA:  Respiratory failure. EXAM: PORTABLE CHEST 1 VIEW COMPARISON:  Radiograph of April 02, 2017. FINDINGS: Stable cardiomediastinal silhouette. Stable mild left pneumothorax is noted. Mild bilateral perihilar and basilar opacities are noted concerning for edema or atelectasis. Mild bilateral pleural effusions are noted as well. Interval placement of right internal jugular dialysis catheter with distal tip in expected position of right atrium. Bony thorax is unremarkable. IMPRESSION: Stable mild left pneumothorax. Stable bilateral perihilar and basilar opacities are noted concerning for edema or atelectasis with mild pleural effusions. Interval placement of right internal jugular dialysis catheter with distal tip in expected position of right atrium. Electronically Signed   By: Lupita Raider, M.D.   On: 04/02/2017 20:58    Dg Chest Port 1 View  Result Date: 04/02/2017 CLINICAL DATA:  Increasing respiratory needs and hypoxia EXAM: PORTABLE CHEST 1 VIEW COMPARISON:  Film from earlier in the same day FINDINGS: Small left pneumothorax is again identified and unchanged given a change in patient positioning. Again this is felt to be related to incomplete re-expansion of the left lung. There is now however new parenchymal density identified in the mid and lower portion of the left lung consistent with re-expansion edema. Persistent large right pleural effusion is noted. Cardiac shadow is stable. Bony structures are stable. IMPRESSION: Changes of re-expansion edema within the left lung. Stable nontraumatic left pneumothorax. Electronically Signed   By: Alcide Clever M.D.   On: 04/02/2017 14:50   US Thoracentesis Asp Pleural Space W/img Guide  Result Date: 04/02/2017 INDICATION: Bilateral pleural effusions EXAM: ULTRASOUND GUIDED LEFT THORACENTESIS MEDICATIONS: None. COMPLICATIONS: None immediate. PROCEDURE: An ultrasound guided thoracentesis was thoroughly discussed with the patient and questions answered. The benefits, risks, alternatives and complications were also discussed. The patient understands and wishes to proceed with the procedure. Written consent was obtained. Ultrasound was performed to localize and mark an adequate pocket of fluid in the left chest. The area was then prepped and draped in the normal sterile fashion. 1% Lidocaine was used for local anesthesia. Under ultrasound guidance, a 6 Fr Safe-T-Centesis catheter was introduced. Thoracentesis was performed. The catheter was removed and a dressing applied. FINDINGS: A total of approximately 2.8 L of clear yellow fluid was removed. Samples were sent to the laboratory as requested by the clinical team. IMPRESSION: Successful ultrasound guided left thoracentesis yielding 2.8 L of pleural fluid. Electronically Signed   By: Alcide Clever M.D.   On:  04/02/2017 12:17      ASSESSMENT AND PLAN:   36 year old male with history of chronic diastolic heart failure, primary pulmonary hypertension and hypothyroidism with chronic kidney disease stage III who presents for [facility] due to shortness of breath.   1. Acute on chronic hypoxic respiratory failure in the setting of fluid overload as well as small pneumothorax Patient titrated off of BiPAP and on nasal cannula  2 Acute on chronic kidney failure Stage III: Concern is for progression of chronic kidney disease to chronic kidney disease stage V Nephrology consult appreciated. Renal ultrasound showed no hydronephrosis Patient will  start hemodialysis. Not a candidate for kidney biopsy due to body habitus  3.  acute on chronic diastolic heart failure and pulmonary hypertension and anasarca Patient will start hemodialysis  Echocardiogram shows normal ejection fraction without major valvular abnormalities U/s guideded paracentesis status post 2.8 L of fluid on August 20.  4. Left thigh wound:  Status post bedside debridement day # 3 by surgeryFollow-up final wound cultures  Continue vancomycin and cefepime Pressure offloading Daily dressing changes with Santyl  4. Essential hypertension: Continue irbesartan and Bisoprolol 5. Hypothyroid: Continue Synthroid 6. Hyperlipidemia: Continue statin   Management plans discussed with the patient and he is in agreement.  CODE STATUS: full  TOTAL TIME TAKING CARE OF THIS PATIENT: 24 minutes.     POSSIBLE D/C 4-6 days, DEPENDING ON CLINICAL CONDITION.   [name] M.D on 04/03/2017 at 8:50 AM  Between 7am to 6pm - Pager - [phone] After 6pm go to [email or web address] - password [email or web address]  [facility]  Office  [phone]  CC: Primary care physician; Patient, No Pcp Per  Note: This dictation was prepared with Dragon dictation along with smaller phrase technology. Any transcriptional errors that result from this process  are unintentional.

## 2017-04-03 NOTE — Progress Notes (Signed)
Initial Nutrition Assessment  DOCUMENTATION CODES:   Severe malnutrition in context of chronic illness, Obesity unspecified  INTERVENTION:  Provide Nepro Shake po TID, each supplement provides 425 kcal and 19 grams protein.  Provide renal-appropriate multivitamin with minerals (Rena-vite) QHS to replace losses from HD.  Discussed increased needs for calories and protein on HD. Encouraged patient to try to eat his meals, and have a good serving of protein at meals. Patient would benefit from further education on renal diet, but not appropriate at this time. Will provide education when patient is feeling better.  NUTRITION DIAGNOSIS:   Malnutrition (Severe) related to chronic illness (CKD, anasarca, CHF, lymphedema, stage III wound) as evidenced by severe depletion of body fat, severe depletion of muscle mass.  GOAL:   Patient will meet greater than or equal to 90% of their needs  MONITOR:   PO intake, Supplement acceptance, Labs, Weight trends, Skin, I & O's  REASON FOR ASSESSMENT:   Consult Assessment of nutrition requirement/status  ASSESSMENT:   36 year old male with PMHx of HTN, bilateral lymphedema, anxiety, depression, HLD, diastolic CHF, primary pulmonary HTN, hypothyroidism, CKD stage III, who presented from [place] in setting of dyspnea and acute renal failure (likely progression to CKD stage V), left pleural effusion, anasarca, also with left thigh wound likely pressure injury.    -Patient s/p bedside debridement of left thigh stage III pressure wound on 8/18. -Patient s/p left thoracentesis on 8/20. -Patient s/p placement of 20 cm triple lumen dialysis catheter right femoral vein 8/20. -He underwent first session of HD on 8/20, but had episode of unresponsiveness with 2 minutes of HD remaining. Received 300 ml saline bolus and D50.  Spoke with patient at bedside. He reports he has been at [facility] since April 2018. He reports he was on a heart  healthy diet there until his fluid started coming off. The RD then placed him on a regular diet. They were also providing him with Pro-Stat, which he did not like because it was too sweet. He reports he has had a poor appetite for a while now and has noticed he has lost muscle and fat. Patient reports he is experiencing early satiety (likely in setting of anasarca) and cannot eat much of his meals. He usually attempts to eat the protein portion of meals, but for example could only finish approximately 50% of the hamburger only (no bun) and a bite of a steak fry. For over the past week patient reports his appetite has been even lower. He is finishing <25% of meals per his report, occasionally only bites. Patient is amenable to drinking Nepro. He reports he has not been able to get around well since going to [facility]. At first he was ambulating to the nurses station with PT, but eventually was only doing bed exercises with them. He reports his wound on his thigh developed while he was at [facility].  Patient reports it is difficult to assess how his weight has been recently in setting of edema. He believes he weighed between 326-340 lbs. However, noted in chart that patient was 413.4 lbs on 12/17/2014 and 425 lbs on 12/24/2014.  Patient was 152.5 kg on 8/17. He was 149.1 kg prior to HD yesterday and 147.5 kg after HD. Per Dr. Candiss Norse patient's dry weight likely 10 kg below current weight, which would be approximately 137.5 kg. Renal-adjusted body weight is 122.6 kg.  Medications reviewed and include: calcium acetate 667 mg TID with meals, Colace, levothyroxine,  cefepime, vancomycin.  Labs reviewed: CBG 70-108 past 24 hrs, BUN 97, Creatinine 6.38, eGFR 10.  Nutrition-Focused physical exam completed. Findings are severe fat depletion (orbital and buccal regions, upper arm region; unable to assess thoracic/lumbar region well in setting of anasarca and patient's limited mobility), moderate-severe  muscle depletion (moderate depletion of dorsal hand; severe depletion of temple region, clavicle region; clavicle/acromion bone region; unable to assess scapular bone region in setting of limited mobility; unable to assess patellar, anterior thigh, and posterior calf regions in setting of edema), and severe edema. Patient pallor.   Discussed with RN.   Diet Order:  Diet renal/carb modified with fluid restriction Diet-HS Snack? Nothing; Room service appropriate? Yes; Fluid consistency: Thin  Skin:  Wound (see comment) (Stg III to left upper thigh per surgery note)  Last BM:  PTA (03/29/2017)  Height:   Ht Readings from Last 1 Encounters:  03/30/17 5' 9"  (1.753 m)    Weight:   Wt Readings from Last 1 Encounters:  04/02/17 (!) 325 lb 2.9 oz (147.5 kg)    Ideal Body Weight:  72.7 kg  BMI:  Body mass index is 48.02 kg/m.  Estimated Nutritional Needs:   Kcal:  5848-3507 (MSJ x 1.1-1.2 w/ renal adj wt)  Protein:  123-147 grams (1-1.2 grams/kg renal adj wt)  Fluid:  UOP + 1 L  EDUCATION NEEDS:   Education needs no appropriate at this time  Willey Blade, MS, RD, LDN Pager: [phone] After Hours Pager: [phone]

## 2017-04-03 NOTE — Progress Notes (Signed)
Pre hd info 

## 2017-04-03 NOTE — Progress Notes (Signed)
   04/03/17 1100  Unmeasured Output  Urine Occurrence 1  Stool Occurrence 1  Urine Characteristics  Urinary Incontinence No  Urine Appearance Clear  Urine Odor No odor  Stool Characteristics  Bowel Incontinence No  Stool Type Type 6 (Mushy consistency with ragged edges)  Stool Descriptors Brown  Stool Amount Medium  Stool Source Rectum      Unable to accurately measure I & O due to his penis being buried in the groin + scrotal edema

## 2017-04-03 NOTE — Progress Notes (Signed)
   04/03/17 1440  Vitals  BP 131/83  MAP (mmHg) 97  Pulse Rate 72  ECG Heart Rate 72  Resp 16  Oxygen Therapy  SpO2 99 %     HD took out 1 litre

## 2017-04-03 NOTE — Progress Notes (Signed)
End of hd, pt alert, no c/o, stable, goal met

## 2017-04-03 NOTE — Progress Notes (Signed)
[facility]  ROUNDING NOTE   Subjective:   Temp dialysis cathter placed yesterday evening. Permcath could not be placed because patient was hypoxic and could not lay flat. Patient underwent HD last evening. Towards the end of treatment, he became unresponsive briefly but regained consciousness shortly thereafter with 250 cc iv fluid bolus and D 50 administration This morning, patient reports that his legs are sore    Objective:  Vital signs in last 24 hours:  Temp:  [96.2 F (35.7 C)-97.8 F (36.6 C)] 97.6 F (36.4 C) (08/21 0200) Pulse Rate:  [59-83] 59 (08/21 0600) Resp:  [8-30] 10 (08/21 0600) BP: (100-147)/(61-94) 103/66 (08/21 0600) SpO2:  [80 %-100 %] 99 % (08/21 0600) FiO2 (%):  [50 %] 50 % (08/20 1527) Weight:  [147.5 kg (325 lb 2.9 oz)-149.1 kg (328 lb 11.3 oz)] 147.5 kg (325 lb 2.9 oz) (08/20 1855)  Weight change:  Filed Weights   03/30/17 2101 04/02/17 1646 04/02/17 1855  Weight: (!) 152.5 kg (336 lb 3.2 oz) (!) 149.1 kg (328 lb 11.3 oz) (!) 147.5 kg (325 lb 2.9 oz)    Intake/Output: I/O last 3 completed shifts: In: 350 [IV Piggyback:350] Out: 1750 [Urine:700; Other:1050]   Intake/Output this shift:  No intake/output data recorded.  Physical Exam: General: NAD, laying in bed  Head: +temporal wasting  Eyes: Anicteric,    Neck: obese  Lungs:  Diminished bilaterally  Heart: Regular rate and rhythm  Abdomen:  Soft, nontender, obese  Extremities: 3+ dependent peripheral edema, left thigh with dressings: +induration  Neurologic: Nonfocal, moving all four extremities  Skin: Prurigo lesions over thigs  Access: none    Basic Metabolic Panel:  Recent Labs Lab 03/30/17 1749 03/31/17 0408 04/01/17 0332 04/02/17 0418 04/03/17 0351  NA 139 142 141 142 142  K 4.5 4.6 4.6 4.7 4.4  CL 105 107 107 108 107  CO2 22 23 24 25 26   GLUCOSE 90 94 92 100* 108*  BUN 110* 102* 102* 108* 97*  CREATININE 6.19* 6.68* 6.60* 6.81* 6.38*  CALCIUM 7.3* 7.1*  7.2* 7.0* 6.7*  PHOS  --   --  9.1*  --   --     Liver Function Tests:  Recent Labs Lab 03/30/17 1749 04/01/17 0332  AST 13* 11*  ALT 15* 13*  ALKPHOS 88 70  BILITOT 0.5 0.5  PROT 7.4 6.7  ALBUMIN 1.7* 1.6*   No results for input(s): LIPASE, AMYLASE in the last 168 hours. No results for input(s): AMMONIA in the last 168 hours.  CBC:  Recent Labs Lab 03/30/17 1749 03/31/17 0408 04/01/17 0332 04/03/17 0351  WBC 11.0* 11.0* 13.0* 12.5*  NEUTROABS 8.6*  --   --   --   HGB 9.0* 8.9* 8.9* 8.3*  HCT 28.3* 26.2* 28.2* 26.0*  MCV 81.7 82.3 83.1 81.8  PLT 537* 461* 501* 433    Cardiac Enzymes:  Recent Labs Lab 03/30/17 1749  TROPONINI <0.03    BNP: Invalid input(s): POCBNP  CBG:  Recent Labs Lab 04/02/17 1848 04/02/17 1937 04/02/17 2332 04/03/17 0354 04/03/17 0728  GLUCAP 70 108* 71 96 83    Microbiology: Results for orders placed or performed during the hospital encounter of 03/30/17  Aerobic/Anaerobic Culture (surgical/deep wound)     Status: None (Preliminary result)   Collection Time: 03/30/17  7:56 PM  Result Value Ref Range Status   Specimen Description WOUND LEFT THIGH  Final   Special Requests NONE  Final   Gram Stain   Final  ABUNDANT WBC PRESENT, PREDOMINANTLY PMN ABUNDANT GRAM POSITIVE COCCI ABUNDANT GRAM NEGATIVE RODS MODERATE GRAM POSITIVE RODS MODERATE GRAM NEGATIVE COCCI Performed at [facility], [address], [place], [place] [address]    Culture   Final    CULTURE REINCUBATED FOR BETTER GROWTH NO ANAEROBES ISOLATED; CULTURE IN PROGRESS FOR 5 DAYS    Report Status PENDING  Incomplete  MRSA PCR Screening     Status: Abnormal   Collection Time: 03/30/17  8:58 PM  Result Value Ref Range Status   MRSA by PCR POSITIVE (A) NEGATIVE Final    Comment:        The GeneXpert MRSA Assay (FDA approved for NASAL specimens only), is one component of a comprehensive MRSA colonization surveillance program. It is not intended to  diagnose MRSA infection nor to guide or monitor treatment for MRSA infections. RESULT CALLED TO, READ BACK BY AND VERIFIED WITH: KARA CAMPBELL AT 2230 03/30/17.PMH   Body fluid culture     Status: None (Preliminary result)   Collection Time: 04/02/17 11:50 AM  Result Value Ref Range Status   Specimen Description PLEURAL  Final   Special Requests NONE  Final   Gram Stain NO WBC SEEN NO ORGANISMS SEEN   Final   Culture   Final    NO GROWTH < 24 HOURS Performed at [facility], [address], [place], [place] [address]    Report Status PENDING  Incomplete    Coagulation Studies:  Recent Labs  04/01/17 0332  LABPROT 17.9*  INR 1.46    Urinalysis:  Recent Labs  03/31/17 1330  COLORURINE YELLOW*  LABSPEC 1.011  PHURINE 5.0  GLUCOSEU 150*  HGBUR SMALL*  BILIRUBINUR NEGATIVE  KETONESUR NEGATIVE  PROTEINUR >=300*  NITRITE NEGATIVE  LEUKOCYTESUR NEGATIVE      Imaging: Dg Chest 1 View  Result Date: 04/02/2017 CLINICAL DATA:  Status post left thoracentesis EXAM: CHEST 1 VIEW COMPARISON:  03/30/2017 FINDINGS: Cardiac shadow remains mildly enlarged. Moderate right pleural effusion is again seen and stable. A large left-sided pleural effusion has resolved in the interval following thoracentesis. Minimal pneumothorax is noted related to incomplete re-expansion of the lung. This should resolve without difficulty. The patient is currently asymptomatic. IMPRESSION: Resolution of left pleural effusion. Mild pneumothorax likely related to incomplete re-expansion of the left lung. Patient is currently asymptomatic in this should resolve without difficulty. Follow-up film 1 04/03/2017 is recommended for further evaluation. Stable right pleural effusion. Electronically Signed   By: Alcide Clever M.D.   On: 04/02/2017 12:16   Dg Chest Port 1 View  Result Date: 04/03/2017 CLINICAL DATA:  Respiratory failure. EXAM: PORTABLE CHEST 1 VIEW COMPARISON:  04/02/2017. FINDINGS: Right IJ  line in stable position. Heart size stable. Diffuse bilateral pulmonary interstitial prominence and bilateral pleural effusions, particular prominent on right again noted suggesting CHF. Left-sided pneumothorax again noted. Slight progression from prior exam cannot be excluded. No acute bony abnormality. IMPRESSION: 1. Left-sided pneumothorax again noted. Slight progression from prior exam cannot be excluded. 2. Diffuse bilateral interstitial prominence and bilateral pleural effusions, particularly prominent on the right again noted. 3. Right IJ line in unchanged position. Electronically Signed   By: Maisie Fus  Register   On: 04/03/2017 06:07   Dg Chest Port 1 View  Result Date: 04/02/2017 CLINICAL DATA:  Respiratory failure. EXAM: PORTABLE CHEST 1 VIEW COMPARISON:  Radiograph of April 02, 2017. FINDINGS: Stable cardiomediastinal silhouette. Stable mild left pneumothorax is noted. Mild bilateral perihilar and basilar opacities are  noted concerning for edema or atelectasis. Mild bilateral pleural effusions are noted as well. Interval placement of right internal jugular dialysis catheter with distal tip in expected position of right atrium. Bony thorax is unremarkable. IMPRESSION: Stable mild left pneumothorax. Stable bilateral perihilar and basilar opacities are noted concerning for edema or atelectasis with mild pleural effusions. Interval placement of right internal jugular dialysis catheter with distal tip in expected position of right atrium. Electronically Signed   By: Lupita Raider, M.D.   On: 04/02/2017 20:58   Dg Chest Port 1 View  Result Date: 04/02/2017 CLINICAL DATA:  Increasing respiratory needs and hypoxia EXAM: PORTABLE CHEST 1 VIEW COMPARISON:  Film from earlier in the same day FINDINGS: Small left pneumothorax is again identified and unchanged given a change in patient positioning. Again this is felt to be related to incomplete re-expansion of the left lung. There is now however new  parenchymal density identified in the mid and lower portion of the left lung consistent with re-expansion edema. Persistent large right pleural effusion is noted. Cardiac shadow is stable. Bony structures are stable. IMPRESSION: Changes of re-expansion edema within the left lung. Stable nontraumatic left pneumothorax. Electronically Signed   By: Alcide Clever M.D.   On: 04/02/2017 14:50   US Thoracentesis Asp Pleural Space W/img Guide  Result Date: 04/02/2017 INDICATION: Bilateral pleural effusions EXAM: ULTRASOUND GUIDED LEFT THORACENTESIS MEDICATIONS: None. COMPLICATIONS: None immediate. PROCEDURE: An ultrasound guided thoracentesis was thoroughly discussed with the patient and questions answered. The benefits, risks, alternatives and complications were also discussed. The patient understands and wishes to proceed with the procedure. Written consent was obtained. Ultrasound was performed to localize and mark an adequate pocket of fluid in the left chest. The area was then prepped and draped in the normal sterile fashion. 1% Lidocaine was used for local anesthesia. Under ultrasound guidance, a 6 Fr Safe-T-Centesis catheter was introduced. Thoracentesis was performed. The catheter was removed and a dressing applied. FINDINGS: A total of approximately 2.8 L of clear yellow fluid was removed. Samples were sent to the laboratory as requested by the clinical team. IMPRESSION: Successful ultrasound guided left thoracentesis yielding 2.8 L of pleural fluid. Electronically Signed   By: Alcide Clever M.D.   On: 04/02/2017 12:17     Medications:   . albumin human    . ceFEPime (MAXIPIME) IV Stopped (04/02/17 2019)  . vancomycin     . apixaban  5 mg Oral BID  . atorvastatin  20 mg Oral QHS  . bisoprolol  10 mg Oral Daily  . buPROPion  100 mg Oral BID  . calcium acetate  667 mg Oral TID WC  . Chlorhexidine Gluconate Cloth  6 each Topical Q0600  . collagenase   Topical Daily  . docusate sodium  100 mg Oral  BID  . irbesartan  75 mg Oral QHS  . levothyroxine  75 mcg Oral QAC breakfast  . mouth rinse  15 mL Mouth Rinse BID  . mupirocin ointment  1 application Nasal BID  . tuberculin  5 Units Intradermal Once   acetaminophen **OR** acetaminophen, fentaNYL (SUBLIMAZE) injection, hydrALAZINE, ipratropium-albuterol, ondansetron **OR** ondansetron (ZOFRAN) IV, traMADol, vancomycin  Assessment/ Plan:  [name] is a 36 y.o. white male with hypertension, morbid obesity, pulmonary hypertension, hypothyroidism, hyperlipidemia, depression/anxiety, coronary artery disease, congestive heart failure , who was admitted to [facility] on 03/30/2017  1. Acute renal failure on chronic kidney disease stage III with nephrotic range proteinuria, hematuria and glucosuria No baseline creatinine  available. Reported creatinine of 1.3 in 2014.  Concern for progression of chronic kidney disease to CKD stage V.  Patient has undiagnosed Nephrotic syndrome. He states he has been dealing with edema for over 3 yeas but did not seek medical care Renal ultrasound report reviewed. HIV negative Not a candidate for kidney biopsy due to body habitus/ in ability to lie prone Hold iv fluids and anti hypertensives - IV albumin - 1st HD on 8/20 - will probably continue to need chronic dialysis - will look into permacath placement in 1-2 days once stable for conscious sedation - Goal to get him to sit in chair for 3-4 hrs for HD treatment  2. Anasarca, with HTN. Echocardiogram with normal systolic function 8/18. No visualization of hepatic cirrhosis on CT abd/pelvis -  D/c clonidine to allow for higher BP (to help with volume removal with HD) - irbesartan at bedtime  3. Secondary Hyperparathyroidism with hyperphosphatemia.   PTH 60  -   calcium acetate with meals.   4. Anemia of chronic kidney disease: hemoglobin 8.3 -  Oral iron   LOS: 4 Hiroshi Krummel 8/21/20188:50 AM

## 2017-04-03 NOTE — Progress Notes (Signed)
Post hd assessment 

## 2017-04-03 NOTE — Progress Notes (Signed)
Pre hd assessment  

## 2017-04-03 NOTE — Progress Notes (Signed)
Post hd vitals 

## 2017-04-03 NOTE — Progress Notes (Signed)
Pharmacy Antibiotic Note  [name] is a 36 y.o. male admitted on 03/30/2017 with cellulitis and wound infection.  Pharmacy has been consulted for vancomycin and cefepime dosing.  Plan: Random vanc level resulted at 18. Continue current dose of 1g q 24 hours. Continue to monitor renal function closely. Will plan to draw trough prior to the 5th dose, but will draw earlier if changes in renal function occur.  Follow up cx results  Cefepime 1gm iv q24h   Will check a random vancomycin level due to acute renal failure.  Goal trough: 15-20 mcg/ml.   8/21 0300 vanc level 51 called by lab. Drawn ~1 hour after dose given. Level ordered for 24 hours after last dose. Vanc order changed to PRN for placeholder.  Height: 5\' 9"  (175.3 cm) Weight: (!) 325 lb 2.9 oz (147.5 kg) IBW/kg (Calculated) : 70.7  Temp (24hrs), Avg:97 F (36.1 C), Min:96.2 F (35.7 C), Max:97.8 F (36.6 C)   Recent Labs Lab 03/30/17 1749 03/31/17 0408 03/31/17 1516 04/01/17 0332 04/02/17 0418 04/03/17 0351  WBC 11.0* 11.0*  --  13.0*  --  12.5*  CREATININE 6.19* 6.68*  --  6.60* 6.81* 6.38*  VANCORANDOM  --   --  18  --   --   --     Estimated Creatinine Clearance: 23.4 mL/min (A) (by C-G formula based on SCr of 6.38 mg/dL (H)).    No Known Allergies  Antimicrobials this admission: 8/17 vancomycin >>  8/17 cefepime >>    Microbiology results: 8/17 Would culture: GPC, GNR, GPR, GNC MRSA PCR: positive    Thank you for allowing pharmacy to be a part of this patient's care.  Dartha Rozzell S 04/03/2017 5:26 AM

## 2017-04-04 ENCOUNTER — Inpatient Hospital Stay: Payer: Medicaid Other

## 2017-04-04 LAB — CYTOLOGY - NON PAP

## 2017-04-04 LAB — PROTEIN ELECTRO, RANDOM URINE
ALBUMIN ELP UR: UNDETERMINED %
ALPHA-1-GLOBULIN, U: UNDETERMINED %
ALPHA-2-GLOBULIN, U: UNDETERMINED %
Beta Globulin, U: UNDETERMINED %
Gamma Globulin, U: UNDETERMINED %
M Component, Ur: UNDETERMINED %
TOTAL PROTEIN, URINE-UPE24: UNDETERMINED mg/dL

## 2017-04-04 LAB — MPO/PR-3 (ANCA) ANTIBODIES: Myeloperoxidase Abs: <9 U/mL (ref 0.0–9.0)

## 2017-04-04 LAB — VANCOMYCIN, RANDOM: VANCOMYCIN RM: 21

## 2017-04-04 LAB — PH, BODY FLUID: pH, Body Fluid: 7.7

## 2017-04-04 LAB — GLOMERULAR BASEMENT MEMBRANE ANTIBODIES: GBM AB: 6 U (ref 0–20)

## 2017-04-04 LAB — VANCOMYCIN, TROUGH: Vancomycin Tr: 26 ug/mL (ref 15–20)

## 2017-04-04 MED ORDER — VANCOMYCIN HCL IN DEXTROSE 1-5 GM/200ML-% IV SOLN
1000.0000 mg | INTRAVENOUS | Status: AC | PRN
  Administered 2017-04-07: 1000 mg via INTRAVENOUS
  Filled 2017-04-04 (×2): qty 200

## 2017-04-04 NOTE — Progress Notes (Signed)
Pharmacy Antibiotic Note  [name] is a 36 y.o. male admitted on 03/30/2017 with cellulitis and wound infection.  Pharmacy has been consulted for vancomycin and cefepime dosing.  Patient new start HD and has had 2 sessions (8/20 and 8/21). No HD today.  Patient received vancomycin 1000 mg doses on 8/20 and 8/21.  Plan: Vancomycin random level this afternoon is 21 mcg/mL which is within therapeutic range. Continue with vancomycin 1000 mg IV with HD.  Continue cefepime 1gm iv q24h    Height: 5\' 9"  (175.3 cm) Weight: (!) 325 lb 2.9 oz (147.5 kg) IBW/kg (Calculated) : 70.7  Temp (24hrs), Avg:97.9 F (36.6 C), Min:97.7 F (36.5 C), Max:98.1 F (36.7 C)   Recent Labs Lab 03/30/17 1749 03/31/17 0408 03/31/17 1516 04/01/17 0332 04/02/17 0418 04/03/17 0351 04/04/17 0246 04/04/17 1716  WBC 11.0* 11.0*  --  13.0*  --  12.5*  --   --   CREATININE 6.19* 6.68*  --  6.60* 6.81* 6.38*  --   --   VANCOTROUGH  --   --   --   --   --   --  26*  --   VANCORANDOM  --   --  18  --   --   --   --  21    Estimated Creatinine Clearance: 23.4 mL/min (A) (by C-G formula based on SCr of 6.38 mg/dL (H)).    No Known Allergies  Antimicrobials this admission: 8/17 vancomycin >>  8/17 cefepime >>    Microbiology results: 8/17 Would culture: GPC, GNR, GPR, GNC MRSA PCR: positive    Thank you for allowing pharmacy to be a part of this patient's care.  Cindi Carbon, PharmD, BCPS Clinical Pharmacist 04/04/2017 7:55 PM

## 2017-04-04 NOTE — Plan of Care (Signed)
Problem: Activity: Goal: Risk for activity intolerance will decrease Outcome: Not Progressing Patient is bed bound.  Problem: Activity: Goal: Activity intolerance will improve Outcome: Not Progressing Patient is bed bound and weak.

## 2017-04-04 NOTE — Evaluation (Signed)
Physical Therapy Evaluation Patient Details Name: [name] MRN: [id number] DOB: 18-Nov-1980 Today's Date: 04/04/2017   History of Present Illness  Pt is a 36 y.o. M who presented to the ED from [facility] with SOB, renal failure, multiple skin wounds, and swelling in B LE. Pt admitted 03/30/2017 with acute on chronic renal failure, acute on chronic respiratory failure with hypoxia, and B pulmonary effusion. Pt s/p R IJ temporary HD catheter placement, s/p debridement of a L thigh wound, and s/p L thoracentesis. PMH: occassional tremors, B lymphedema, obesity, CKD, R cardiac cath (2016), HLD, HTN, major depression, diastolic CHF, primary pulmonary HTN, hypothyroidism, and new dialysis patient.    Clinical Impression  Prior to admission pt reports bed-bound for ~2 months due to L LE pain; pt reports ability to ambulate ~40 ft, take a short rest break, and ambulate ~40 ft again with a RW and +1 min assist to stand prior to significant increase in pain. Pt anticipates discharge back to [facility]. Pt A and O x4 throughout session. Pt reports 5/10 B LE pain beginning of session; increase to 8/10 R LE pain during activity/therex; increase to 6/10 L LE pain during activity/therex; 6/10 B LE end of session. Pt declines bed mobility due to reported significant pain and trouble breathing upon sitting; pt able to perform bed exercises during today's session. Pt will continue to benefit from skilled PT to address strength and functional mobility deficits. Recommended discharge to SNF.    Follow Up Recommendations SNF    Equipment Recommendations  None recommended by PT    Recommendations for Other Services       Precautions / Restrictions Precautions Precautions: Fall;Other (comment) Precaution Comments: R IJ temporary HD cath restrictions Restrictions Weight Bearing Restrictions: No      Mobility  Bed Mobility               General bed mobility comments: pt declined due  to increase B LE pain and complaints of SOB/trouble breathing  Transfers                 General transfer comment: deferred due to R IJ temporary HD cath  Ambulation/Gait             General Gait Details: deferred due to R IJ temporary HD cath  Stairs            Wheelchair Mobility    Modified Rankin (Stroke Patients Only)       Balance Overall balance assessment:  (not assessed due to pt declines sitting and R IJ cath placement)                                           Pertinent Vitals/Pain Pain Assessment: 0-10 Pain Score: 8  Pain Location: pt reports 5/10 B LE pain beginning of session; increase to 8/10 R LE pain during activity/therex; increase to 6/10 L LE pain during activity/therex; 6/10 B LE end of session Pain Descriptors / Indicators: Aching;Sore;Constant Pain Intervention(s): Limited activity within patient's tolerance;Monitored during session HR - 64 to 78 bpm throughout session via pulse ox O2 - 92 to 98% on 4L [place] throughout session via pulse ox    Home Living Family/patient expects to be discharged to:: Skilled nursing facility                 Additional Comments: plans to return to  [facility]    Prior Function Level of [place]: Needs assistance   Gait / Transfers Assistance Needed: pt reports non-ambulatory (bed bound) for ~2 months; prior to this pt reports ability to ambulate ~40 ft with RW, take a sitting rest break, and ambulate another ~40 ft with RW with +1 min assist for initial stand  ADL's / Homemaking Assistance Needed: pt reports bed bound for the past 2 months; able to feed self, requires sponge bath for bathing        Hand Dominance   Dominant Hand: Right    Extremity/Trunk Assessment   Upper Extremity Assessment Upper Extremity Assessment: Overall WFL for tasks assessed    Lower Extremity Assessment Lower Extremity Assessment: Generalized weakness;RLE deficits/detail RLE  Deficits / Details: unable to fully assess due to reported increased pain in the R LE RLE: Unable to fully assess due to pain       Communication   Communication: No difficulties  Cognition Arousal/Alertness: Awake/alert Behavior During Therapy: WFL for tasks assessed/performed Overall Cognitive Status: Within Functional Limits for tasks assessed                                        General Comments      Exercises Total Joint Exercises Ankle Circles/Pumps: AROM;Both;10 reps;Supine Quad Sets: AROM;Strengthening;Both;10 reps;Supine Gluteal Sets: AROM;Strengthening;Both;10 reps;Supine Towel Squeeze: AROM;Strengthening;Both;10 reps;Supine Short Arc Quad: AROM;Strengthening;Both;10 reps;Supine Heel Slides: AROM;Left;AAROM;Right;10 reps;Supine;Strengthening Hip ABduction/ADduction: AROM;Strengthening;Both;10 reps;Supine Straight Leg Raises: AROM;Left;AAROM;Right;10 reps;Supine;Strengthening   Assessment/Plan    PT Assessment Patient needs continued PT services  PT Problem List Decreased strength;Decreased activity tolerance;Decreased mobility;Cardiopulmonary status limiting activity;Pain       PT Treatment Interventions DME instruction;Gait training;Functional mobility training;Therapeutic activities;Therapeutic exercise;Balance training;Patient/family education    PT Goals (Current goals can be found in the Care Plan section)  Acute Rehab PT Goals Patient Stated Goal: to get stronger PT Goal Formulation: With patient Time For Goal Achievement: 04/18/17 Potential to Achieve Goals: Good    Frequency Min 2X/week   Barriers to discharge        Co-evaluation               AM-PAC PT "6 Clicks" Daily Activity  Outcome Measure Difficulty turning over in bed (including adjusting bedclothes, sheets and blankets)?: Unable Difficulty moving from lying on back to sitting on the side of the bed? : Unable Difficulty sitting down on and standing up from a  chair with arms (e.g., wheelchair, bedside commode, etc,.)?: Unable Help needed moving to and from a bed to chair (including a wheelchair)?: Total Help needed walking in hospital room?: Total Help needed climbing 3-5 steps with a railing? : Total 6 Click Score: 6    End of Session Equipment Utilized During Treatment: Oxygen Activity Tolerance: Patient tolerated treatment well Patient left: in bed;with call bell/phone within reach;with bed alarm set Nurse Communication: Mobility status;Precautions PT Visit Diagnosis: Muscle weakness (generalized) (M62.81);Difficulty in walking, not elsewhere classified (R26.2)    Time: 1426-1500 PT Time Calculation (min) (ACUTE ONLY): 34 min   Charges:         PT G CodesGwenlyn Found, SPT 2017/05/01,3:44 PM [phone]

## 2017-04-04 NOTE — Progress Notes (Signed)
Pharmacy Antibiotic Note  [name] is a 36 y.o. male admitted on 03/30/2017 with cellulitis and wound infection.  Pharmacy has been consulted for vancomycin and cefepime dosing.  Patient new start HD and has had 2 sessions so far. Will not be receiving HD session today. Will order another vanc trough for 1600 and dose based on level.   Plan: 8/22 0200 vanc level 26. No dose for tonight.   Continue cefepime 1gm iv q24h   Will check a random vancomycin level due to acute renal failure.  Goal trough: 15-25 mcg/ml.    Height: 5\' 9"  (175.3 cm) Weight: (!) 325 lb 2.9 oz (147.5 kg) IBW/kg (Calculated) : 70.7  Temp (24hrs), Avg:97.9 F (36.6 C), Min:97.5 F (36.4 C), Max:98.1 F (36.7 C)   Recent Labs Lab 03/30/17 1749 03/31/17 0408 03/31/17 1516 04/01/17 0332 04/02/17 0418 04/03/17 0351 04/04/17 0246  WBC 11.0* 11.0*  --  13.0*  --  12.5*  --   CREATININE 6.19* 6.68*  --  6.60* 6.81* 6.38*  --   VANCOTROUGH  --   --   --   --   --   --  26*  VANCORANDOM  --   --  18  --   --   --   --     Estimated Creatinine Clearance: 23.4 mL/min (A) (by C-G formula based on SCr of 6.38 mg/dL (H)).    No Known Allergies  Antimicrobials this admission: 8/17 vancomycin >>  8/17 cefepime >>    Microbiology results: 8/17 Would culture: GPC, GNR, GPR, GNC MRSA PCR: positive    Thank you for allowing pharmacy to be a part of this patient's care.  Cleopatra Cedar  Pharmacy Resident  04/04/2017 1:11 PM

## 2017-04-04 NOTE — Progress Notes (Signed)
Pharmacy Antibiotic Note  [name] is a 36 y.o. male admitted on 03/30/2017 with cellulitis and wound infection.  Pharmacy has been consulted for vancomycin and cefepime dosing.  Plan: Random vanc level resulted at 18. Continue current dose of 1g q 24 hours. Continue to monitor renal function closely. Will plan to draw trough prior to the 5th dose, but will draw earlier if changes in renal function occur.  Follow up cx results  Cefepime 1gm iv q24h   Will check a random vancomycin level due to acute renal failure.  Goal trough: 15-20 mcg/ml.   8/21 0300 vanc level 51 called by lab. Drawn ~1 hour after dose given. Level ordered for 24 hours after last dose. Vanc order changed to PRN for placeholder.  8/22 0200 vanc level 26. No dose for tonight. Recheck level in 24 hours. Will resume when level <20.  Height: 5\' 9"  (175.3 cm) Weight: (!) 325 lb 2.9 oz (147.5 kg) IBW/kg (Calculated) : 70.7  Temp (24hrs), Avg:97.7 F (36.5 C), Min:97.1 F (36.2 C), Max:98 F (36.7 C)   Recent Labs Lab 03/30/17 1749 03/31/17 0408 03/31/17 1516 04/01/17 0332 04/02/17 0418 04/03/17 0351 04/04/17 0246  WBC 11.0* 11.0*  --  13.0*  --  12.5*  --   CREATININE 6.19* 6.68*  --  6.60* 6.81* 6.38*  --   VANCOTROUGH  --   --   --   --   --   --  26*  VANCORANDOM  --   --  18  --   --   --   --     Estimated Creatinine Clearance: 23.4 mL/min (A) (by C-G formula based on SCr of 6.38 mg/dL (H)).    No Known Allergies  Antimicrobials this admission: 8/17 vancomycin >>  8/17 cefepime >>    Microbiology results: 8/17 Would culture: GPC, GNR, GPR, GNC MRSA PCR: positive    Thank you for allowing pharmacy to be a part of this patient'[name] care.  [name] 04/04/2017 3:57 AM

## 2017-04-04 NOTE — Progress Notes (Addendum)
[employer] [facility] [place] at [facility]   PATIENT NAME: [name]    MR#:  [id number]  DATE OF BIRTH:  [birth year]  SUBJECTIVE:    Patient has some sob this am but about the same  REVIEW OF SYSTEMS:    Review of Systems  Constitutional: Negative for fever, chills weight loss  Positive generalized weakness  HENT: Negative for ear pain, nosebleeds, congestion, facial swelling, rhinorrhea, neck pain, neck stiffness and ear discharge.   Respiratory: Negative for cough, ++shortness of breath, no wheezing  Cardiovascular: Negative for chest pain, palpitations and positive leg swelling.  Gastrointestinal: Negative for heartburn, abdominal pain, vomiting, diarrhea or consitpation Genitourinary: Negative for dysuria, urgency, frequency, hematuria Musculoskeletal: Negative for back pain or joint pain Neurological: Negative for dizziness, seizures, syncope, focal weakness,  numbness and headaches.  Hematological: Does not bruise/bleed easily.  Psychiatric/Behavioral: Negative for hallucinations, confusion, dysphoric mood Skin: left thigh wound covered   Tolerating Diet: yes    DRUG ALLERGIES:  No Known Allergies  VITALS:  Blood pressure 139/81, pulse 74, temperature 97.7 F (36.5 C), temperature source Oral, resp. rate 18, height 5\' 9"  (1.753 m), weight (!) 147.5 kg (325 lb 2.9 oz), SpO2 95 %.  PHYSICAL EXAMINATION:  Constitutional: Appears obese anasarca. NAD HENT: Normocephalic. [name] Oropharynx is clear and moist.  Eyes: Conjunctivae and EOM are normal. PERRLA, no scleral icterus.  Neck: Normal ROM. Neck supple. No JVD. No tracheal deviation. CVS: RRR, S1/S2 +, no murmurs, no gallops, no carotid bruit.  Pulmonary: Decreased breath sounds with scant bilateral wheezing at bases Abdominal: abdominal wall edema  Due to body habitus unable to appreciate for organomegaly BS + no rebound guarding Musculoskeletal: Normal range of motion. +anasarca no tenderness.  Neuro:  Alert. CN 2-12 grossly intact. No focal deficits. Skin:left thigh covered wound   Right femoral dialysis catheter placed Psychiatric: Normal mood and affect.      LABORATORY PANEL:   CBC  Recent Labs Lab 04/03/17 0351  WBC 12.5*  HGB 8.3*  HCT 26.0*  PLT 433   ------------------------------------------------------------------------------------------------------------------  Chemistries   Recent Labs Lab 04/01/17 0332  04/03/17 0351  NA 141  < > 142  K 4.6  < > 4.4  CL 107  < > 107  CO2 24  < > 26  GLUCOSE 92  < > 108*  BUN 102*  < > 97*  CREATININE 6.60*  < > 6.38*  CALCIUM 7.2*  < > 6.7*  AST 11*  --   --   ALT 13*  --   --   ALKPHOS 70  --   --   BILITOT 0.5  --   --   < > = values in this interval not displayed. ------------------------------------------------------------------------------------------------------------------  Cardiac Enzymes  Recent Labs Lab 03/30/17 1749  TROPONINI <0.03   ------------------------------------------------------------------------------------------------------------------  RADIOLOGY:  Dg Chest 1 View  Result Date: 04/02/2017 CLINICAL DATA:  Status post left thoracentesis EXAM: CHEST 1 VIEW COMPARISON:  03/30/2017 FINDINGS: Cardiac shadow remains mildly enlarged. Moderate right pleural effusion is again seen and stable. A large left-sided pleural effusion has resolved in the interval following thoracentesis. Minimal pneumothorax is noted related to incomplete re-expansion of the lung. This should resolve without difficulty. The patient is currently asymptomatic. IMPRESSION: Resolution of left pleural effusion. Mild pneumothorax likely related to incomplete re-expansion of the left lung. Patient is currently asymptomatic in this should resolve without difficulty. Follow-up film 1 04/03/2017 is recommended for further evaluation. Stable right pleural effusion. Electronically Signed  By: Alcide Clever M.D.   On: 04/02/2017 12:16    Dg Chest Port 1 View  Result Date: 04/04/2017 CLINICAL DATA:  Follow-up effusion EXAM: PORTABLE CHEST 1 VIEW COMPARISON:  04/03/2017 FINDINGS: Cardiac shadow remains enlarged. Temporary dialysis catheter is again noted on the right. Large bilateral pleural effusions are again seen increasing on the left. No pneumothorax component is identified at this time. Central vascular congestion is noted. No acute bony abnormality is seen. IMPRESSION: Bilateral pleural effusions increasing on the left. Electronically Signed   By: Alcide Clever M.D.   On: 04/04/2017 07:16   Dg Chest Port 1 View  Result Date: 04/03/2017 CLINICAL DATA:  Respiratory failure. EXAM: PORTABLE CHEST 1 VIEW COMPARISON:  04/02/2017. FINDINGS: Right IJ line in stable position. Heart size stable. Diffuse bilateral pulmonary interstitial prominence and bilateral pleural effusions, particular prominent on right again noted suggesting CHF. Left-sided pneumothorax again noted. Slight progression from prior exam cannot be excluded. No acute bony abnormality. IMPRESSION: 1. Left-sided pneumothorax again noted. Slight progression from prior exam cannot be excluded. 2. Diffuse bilateral interstitial prominence and bilateral pleural effusions, particularly prominent on the right again noted. 3. Right IJ line in unchanged position. Electronically Signed   By: Maisie Fus  Register   On: 04/03/2017 06:07   Dg Chest Port 1 View  Result Date: 04/02/2017 CLINICAL DATA:  Respiratory failure. EXAM: PORTABLE CHEST 1 VIEW COMPARISON:  Radiograph of April 02, 2017. FINDINGS: Stable cardiomediastinal silhouette. Stable mild left pneumothorax is noted. Mild bilateral perihilar and basilar opacities are noted concerning for edema or atelectasis. Mild bilateral pleural effusions are noted as well. Interval placement of right internal jugular dialysis catheter with distal tip in expected position of right atrium. Bony thorax is unremarkable. IMPRESSION: Stable mild left  pneumothorax. Stable bilateral perihilar and basilar opacities are noted concerning for edema or atelectasis with mild pleural effusions. Interval placement of right internal jugular dialysis catheter with distal tip in expected position of right atrium. Electronically Signed   By: Lupita Raider, M.D.   On: 04/02/2017 20:58   Dg Chest Port 1 View  Result Date: 04/02/2017 CLINICAL DATA:  Increasing respiratory needs and hypoxia EXAM: PORTABLE CHEST 1 VIEW COMPARISON:  Film from earlier in the same day FINDINGS: Small left pneumothorax is again identified and unchanged given a change in patient positioning. Again this is felt to be related to incomplete re-expansion of the left lung. There is now however new parenchymal density identified in the mid and lower portion of the left lung consistent with re-expansion edema. Persistent large right pleural effusion is noted. Cardiac shadow is stable. Bony structures are stable. IMPRESSION: Changes of re-expansion edema within the left lung. Stable nontraumatic left pneumothorax. Electronically Signed   By: Alcide Clever M.D.   On: 04/02/2017 14:50   US Thoracentesis Asp Pleural Space W/img Guide  Result Date: 04/02/2017 INDICATION: Bilateral pleural effusions EXAM: ULTRASOUND GUIDED LEFT THORACENTESIS MEDICATIONS: None. COMPLICATIONS: None immediate. PROCEDURE: An ultrasound guided thoracentesis was thoroughly discussed with the patient and questions answered. The benefits, risks, alternatives and complications were also discussed. The patient understands and wishes to proceed with the procedure. Written consent was obtained. Ultrasound was performed to localize and mark an adequate pocket of fluid in the left chest. The area was then prepped and draped in the normal sterile fashion. 1% Lidocaine was used for local anesthesia. Under ultrasound guidance, a 6 Fr Safe-T-Centesis catheter was introduced. Thoracentesis was performed. The catheter was removed and a  dressing applied.  FINDINGS: A total of approximately 2.8 L of clear yellow fluid was removed. Samples were sent to the laboratory as requested by the clinical team. IMPRESSION: Successful ultrasound guided left thoracentesis yielding 2.8 L of pleural fluid. Electronically Signed   By: Alcide Clever M.D.   On: 04/02/2017 12:17     ASSESSMENT AND PLAN:   36 year old male with history of chronic diastolic heart failure, primary pulmonary hypertension and hypothyroidism with chronic kidney disease stage III who presents for [facility] due to shortness of breath.   1. Acute on chronic hypoxic respiratory failure in the setting of fluid overload as well as small pneumothoraxAfter thoracentesis Patient titrated off of BiPAP and on nasal cannula Pneumothorax has resolved  2 Acute on chronic kidney failure Stage III: Concern is for progression of chronic kidney disease to chronic kidney disease stage V Nephrology consult appreciated. Renal ultrasound showed no hydronephrosis Patient Has started dialysis which was initiated August 21 Not a candidate for kidney biopsy due to body habitus  3.  acute on chronic diastolic heart failure and pulmonary hypertension and anasarca Continue hemodialysis  Echocardiogram shows normal ejection fraction without major valvular abnormalities U/s guideded paracentesis status post 2.8 L of fluid on August 20.  4. Left thigh wound:  Status post bedside debridement day # 4 by surgeryFollow-up final wound cultures  Continue vancomycin and cefepime may stop antibiotics on August 25 Pressure offloading Daily dressing changes with Santyl  4. Essential hypertension: Continue irbesartan and Bisoprolol 5. Hypothyroid: Continue Synthroid 6. Hyperlipidemia: Continue statin 7. 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  CODE STATUS: full  TOTAL TIME TAKING CARE OF THIS PATIENT: 23 minutes.     POSSIBLE D/C 5-6  days, DEPENDING ON CLINICAL CONDITION.   Sidra Oldfield M.D on 04/04/2017 at 9:32 AM  Between 7am to 6pm - Pager - [phone] After 6pm go to [email or web address] - password [email or web address]  [facility]  Office  [phone]  CC: Primary care physician; Patient, No Pcp Per  Note: This dictation was prepared with Dragon dictation along with smaller phrase technology. Any transcriptional errors that result from this process are unintentional.

## 2017-04-04 NOTE — Progress Notes (Signed)
[facility]  ROUNDING NOTE   Subjective:   1000 cc removed with second dialysis treatment yesterday Patient states that he feels about the same Appetite is a little better but not significantly improved  He states that he sat for about 10 minutes and then started to feel tired and had to lay down   Objective:  Vital signs in last 24 hours:  Temp:  [97.5 F (36.4 C)-98 F (36.7 C)] 97.7 F (36.5 C) (08/22 0418) Pulse Rate:  [62-74] 73 (08/22 0958) Resp:  [14-21] 18 (08/22 0418) BP: (113-140)/(77-90) 140/90 (08/22 0958) SpO2:  [88 %-99 %] 95 % (08/22 0854)  Weight change:  Filed Weights   03/30/17 2101 04/02/17 1646 04/02/17 1855  Weight: (!) 152.5 kg (336 lb 3.2 oz) (!) 149.1 kg (328 lb 11.3 oz) (!) 147.5 kg (325 lb 2.9 oz)    Intake/Output: I/O last 3 completed shifts: In: 230 [P.O.:180; IV Piggyback:50] Out: 1200 [Urine:200; Other:1000]   Intake/Output this shift:  No intake/output data recorded.  Physical Exam: General: NAD, laying in bed  Head: +temporal wasting  Eyes: Anicteric,    Neck: obese  Lungs:  Diminished bilaterally  Heart: Regular rate and rhythm  Abdomen:  Soft, nontender, obese  Extremities: 3+ dependent peripheral edema, left thigh with dressings: +induration  Neurologic: Nonfocal, moving all four extremities  Skin: Prurigo lesions over thigs  Access: none    Basic Metabolic Panel:  Recent Labs Lab 03/30/17 1749 03/31/17 0408 04/01/17 0332 04/02/17 0418 04/03/17 0351  NA 139 142 141 142 142  K 4.5 4.6 4.6 4.7 4.4  CL 105 107 107 108 107  CO2 22 23 24 25 26   GLUCOSE 90 94 92 100* 108*  BUN 110* 102* 102* 108* 97*  CREATININE 6.19* 6.68* 6.60* 6.81* 6.38*  CALCIUM 7.3* 7.1* 7.2* 7.0* 6.7*  PHOS  --   --  9.1*  --   --     Liver Function Tests:  Recent Labs Lab 03/30/17 1749 04/01/17 0332  AST 13* 11*  ALT 15* 13*  ALKPHOS 88 70  BILITOT 0.5 0.5  PROT 7.4 6.7  ALBUMIN 1.7* 1.6*   No results for input(s):  LIPASE, AMYLASE in the last 168 hours. No results for input(s): AMMONIA in the last 168 hours.  CBC:  Recent Labs Lab 03/30/17 1749 03/31/17 0408 04/01/17 0332 04/03/17 0351  WBC 11.0* 11.0* 13.0* 12.5*  NEUTROABS 8.6*  --   --   --   HGB 9.0* 8.9* 8.9* 8.3*  HCT 28.3* 26.2* 28.2* 26.0*  MCV 81.7 82.3 83.1 81.8  PLT 537* 461* 501* 433    Cardiac Enzymes:  Recent Labs Lab 03/30/17 1749  TROPONINI <0.03    BNP: Invalid input(s): POCBNP  CBG:  Recent Labs Lab 04/02/17 2332 04/03/17 0354 04/03/17 0728 04/03/17 1112 04/03/17 1628  GLUCAP 71 96 83 91 93    Microbiology: Results for orders placed or performed during the hospital encounter of 03/30/17  Aerobic/Anaerobic Culture (surgical/deep wound)     Status: None (Preliminary result)   Collection Time: 03/30/17  7:56 PM  Result Value Ref Range Status   Specimen Description WOUND LEFT THIGH  Final   Special Requests NONE  Final   Gram Stain   Final    ABUNDANT WBC PRESENT, PREDOMINANTLY PMN ABUNDANT GRAM POSITIVE COCCI ABUNDANT GRAM NEGATIVE RODS MODERATE GRAM POSITIVE RODS MODERATE GRAM NEGATIVE COCCI Performed at [facility], [address], [place], [place] [address]    Culture  Final    CULTURE REINCUBATED FOR BETTER GROWTH NO ANAEROBES ISOLATED; CULTURE IN PROGRESS FOR 5 DAYS    Report Status PENDING  Incomplete  MRSA PCR Screening     Status: Abnormal   Collection Time: 03/30/17  8:58 PM  Result Value Ref Range Status   MRSA by PCR POSITIVE (A) NEGATIVE Final    Comment:        The GeneXpert MRSA Assay (FDA approved for NASAL specimens only), is one component of a comprehensive MRSA colonization surveillance program. It is not intended to diagnose MRSA infection nor to guide or monitor treatment for MRSA infections. RESULT CALLED TO, READ BACK BY AND VERIFIED WITH: KARA CAMPBELL AT 2230 03/30/17.PMH   Body fluid culture     Status: None (Preliminary result)   Collection Time:  04/02/17 11:50 AM  Result Value Ref Range Status   Specimen Description PLEURAL  Final   Special Requests NONE  Final   Gram Stain NO WBC SEEN NO ORGANISMS SEEN   Final   Culture   Final    NO GROWTH 2 DAYS Performed at [facility], [address], [place], [place] [address]    Report Status PENDING  Incomplete  Acid Fast Smear (AFB)     Status: None   Collection Time: 04/02/17 11:50 AM  Result Value Ref Range Status   AFB Specimen Processing Concentration  Final   Acid Fast Smear Negative  Final    Comment: (NOTE) Performed At: [facility] [address] [place], [place] [id number] Mila Homer MD AO:[id number]    Source (AFB) PLEURAL  Final    Coagulation Studies: No results for input(s): LABPROT, INR in the last 72 hours.  Urinalysis: No results for input(s): COLORURINE, LABSPEC, PHURINE, GLUCOSEU, HGBUR, BILIRUBINUR, KETONESUR, PROTEINUR, UROBILINOGEN, NITRITE, LEUKOCYTESUR in the last 72 hours.  Invalid input(s): APPERANCEUR    Imaging: Dg Chest Port 1 View  Result Date: 04/04/2017 CLINICAL DATA:  Follow-up effusion EXAM: PORTABLE CHEST 1 VIEW COMPARISON:  04/03/2017 FINDINGS: Cardiac shadow remains enlarged. Temporary dialysis catheter is again noted on the right. Large bilateral pleural effusions are again seen increasing on the left. No pneumothorax component is identified at this time. Central vascular congestion is noted. No acute bony abnormality is seen. IMPRESSION: Bilateral pleural effusions increasing on the left. Electronically Signed   By: Alcide Clever M.D.   On: 04/04/2017 07:16   Dg Chest Port 1 View  Result Date: 04/03/2017 CLINICAL DATA:  Respiratory failure. EXAM: PORTABLE CHEST 1 VIEW COMPARISON:  04/02/2017. FINDINGS: Right IJ line in stable position. Heart size stable. Diffuse bilateral pulmonary interstitial prominence and bilateral pleural effusions, particular prominent on right again noted suggesting CHF. Left-sided  pneumothorax again noted. Slight progression from prior exam cannot be excluded. No acute bony abnormality. IMPRESSION: 1. Left-sided pneumothorax again noted. Slight progression from prior exam cannot be excluded. 2. Diffuse bilateral interstitial prominence and bilateral pleural effusions, particularly prominent on the right again noted. 3. Right IJ line in unchanged position. Electronically Signed   By: Maisie Fus  Register   On: 04/03/2017 06:07   Dg Chest Port 1 View  Result Date: 04/02/2017 CLINICAL DATA:  Respiratory failure. EXAM: PORTABLE CHEST 1 VIEW COMPARISON:  Radiograph of April 02, 2017. FINDINGS: Stable cardiomediastinal silhouette. Stable mild left pneumothorax is noted. Mild bilateral perihilar and basilar opacities are noted concerning for edema or atelectasis. Mild bilateral pleural effusions are noted as well. Interval placement of right internal jugular dialysis catheter with distal tip  in expected position of right atrium. Bony thorax is unremarkable. IMPRESSION: Stable mild left pneumothorax. Stable bilateral perihilar and basilar opacities are noted concerning for edema or atelectasis with mild pleural effusions. Interval placement of right internal jugular dialysis catheter with distal tip in expected position of right atrium. Electronically Signed   By: Lupita Raider, M.D.   On: 04/02/2017 20:58   Dg Chest Port 1 View  Result Date: 04/02/2017 CLINICAL DATA:  Increasing respiratory needs and hypoxia EXAM: PORTABLE CHEST 1 VIEW COMPARISON:  Film from earlier in the same day FINDINGS: Small left pneumothorax is again identified and unchanged given a change in patient positioning. Again this is felt to be related to incomplete re-expansion of the left lung. There is now however new parenchymal density identified in the mid and lower portion of the left lung consistent with re-expansion edema. Persistent large right pleural effusion is noted. Cardiac shadow is stable. Bony structures are  stable. IMPRESSION: Changes of re-expansion edema within the left lung. Stable nontraumatic left pneumothorax. Electronically Signed   By: Alcide Clever M.D.   On: 04/02/2017 14:50     Medications:   . ceFEPime (MAXIPIME) IV Stopped (04/04/17 0201)  . vancomycin     . apixaban  5 mg Oral BID  . atorvastatin  20 mg Oral QHS  . bisoprolol  10 mg Oral Daily  . buPROPion  100 mg Oral BID  . calcium acetate  667 mg Oral TID WC  . collagenase   Topical Daily  . docusate sodium  100 mg Oral BID  . feeding supplement (NEPRO CARB STEADY)  237 mL Oral TID BM  . ferrous sulfate  325 mg Oral Q1200  . irbesartan  75 mg Oral QHS  . levothyroxine  75 mcg Oral QAC breakfast  . multivitamin  1 tablet Oral QHS  . mupirocin ointment  1 application Nasal BID  . tuberculin  5 Units Intradermal Once   acetaminophen **OR** [DISCONTINUED] acetaminophen, hydrALAZINE, ipratropium-albuterol, [DISCONTINUED] ondansetron **OR** ondansetron (ZOFRAN) IV, traMADol, vancomycin  Assessment/ Plan:  Mr. Lizandro Spellman is a 36 y.o. white male with hypertension, morbid obesity, pulmonary hypertension, hypothyroidism, hyperlipidemia, depression/anxiety, coronary artery disease, congestive heart failure , who was admitted to [facility] on 03/30/2017  1. Acute renal failure on chronic kidney disease stage III with nephrotic range proteinuria, hematuria and glucosuria No baseline creatinine available. Reported creatinine of 1.3 in 2014.  Concern for progression of chronic kidney disease to CKD stage V.  Patient has undiagnosed Nephrotic syndrome. He states he has been dealing with edema for over 3 yeas but did not seek medical care Renal ultrasound report reviewed. HIV negative Not a candidate for kidney biopsy due to body habitus/ in ability to lie prone Hold iv fluids and anti hypertensives - IV albumin - 1st HD on 8/20; PPD has been placed on 8/20 - will probably continue to need chronic dialysis - will look into permacath  placement early next week once stable for conscious sedation - Goal is to get him to sit in chair for 3-4 hrs for HD treatment  2. Anasarca, with HTN. Echocardiogram with normal systolic function 8/18. No visualization of hepatic cirrhosis on CT abd/pelvis -  D/c clonidine to allow for higher BP (to help with volume removal with HD) - irbesartan at bedtime  3. Secondary Hyperparathyroidism with hyperphosphatemia.   PTH 60  -   calcium acetate with meals.   4. Anemia of chronic kidney disease: hemoglobin 8.3 -  Oral iron  LOS: 5 Estefanie Cornforth 8/22/201812:08 PM

## 2017-04-05 LAB — BODY FLUID CULTURE
CULTURE: NO GROWTH
Gram Stain: NONE SEEN

## 2017-04-05 LAB — PROTEIN ELECTRO, RANDOM URINE
ALBUMIN ELP UR: 50.9 %
Alpha-1-Globulin, U: 9 %
Alpha-2-Globulin, U: 8 %
Beta Globulin, U: 10.1 %
GAMMA GLOBULIN, U: 22.1 %
Total Protein, Urine: 735.6 mg/dL

## 2017-04-05 LAB — BASIC METABOLIC PANEL
Anion gap: 11 (ref 5–15)
BUN: 96 mg/dL — AB (ref 6–20)
CALCIUM: 7.1 mg/dL — AB (ref 8.9–10.3)
CHLORIDE: 106 mmol/L (ref 101–111)
CO2: 25 mmol/L (ref 22–32)
CREATININE: 6.28 mg/dL — AB (ref 0.61–1.24)
GFR, EST AFRICAN AMERICAN: 12 mL/min — AB (ref >60)
GFR, EST NON AFRICAN AMERICAN: 10 mL/min — AB (ref >60)
Glucose, Bld: 84 mg/dL (ref 65–99)
Potassium: 4.3 mmol/L (ref 3.5–5.1)
SODIUM: 142 mmol/L (ref 135–145)

## 2017-04-05 LAB — AEROBIC/ANAEROBIC CULTURE (SURGICAL/DEEP WOUND)

## 2017-04-05 LAB — AEROBIC/ANAEROBIC CULTURE W GRAM STAIN (SURGICAL/DEEP WOUND)

## 2017-04-05 MED ORDER — VANCOMYCIN HCL 500 MG IV SOLR
500.0000 mg | INTRAVENOUS | Status: AC
  Administered 2017-04-05: 500 mg via INTRAVENOUS
  Filled 2017-04-05: qty 500

## 2017-04-05 MED ORDER — ALBUMIN HUMAN 25 % IV SOLN
12.5000 g | Freq: Once | INTRAVENOUS | Status: DC
  Filled 2017-04-05: qty 50

## 2017-04-05 MED ORDER — MIDODRINE HCL 5 MG PO TABS
5.0000 mg | ORAL_TABLET | Freq: Three times a day (TID) | ORAL | Status: DC
  Administered 2017-04-06: 5 mg via ORAL
  Filled 2017-04-05 (×5): qty 1

## 2017-04-05 MED ORDER — LORAZEPAM 2 MG/ML IJ SOLN
1.0000 mg | Freq: Four times a day (QID) | INTRAMUSCULAR | Status: DC | PRN
  Administered 2017-04-08: 1 mg via INTRAVENOUS
  Filled 2017-04-05: qty 1

## 2017-04-05 NOTE — Progress Notes (Signed)
Post HD  

## 2017-04-05 NOTE — Clinical Social Work Note (Signed)
CSW was informed by patient's nurse yesterday and today that patient was declining to get up in the chair to see if he could sit there for the duration of a dialysis treatment. CSW met with patient and explained that it was a necessity that he be willing to do this as he would not be eligible to obtain an outpatient dialysis schedule if he did not. Patient explained that his anxiety was bothering him. CSW spoke with the physician to see if there was something that could be given to patient to assist with calming him. CSW and nephrology also spoke and went in to see patient together. Patient will attempt to sit in chair for his next dialysis treatment on Saturday. A hoyer lift will have to be used. [name] MSW,LCSW [phone]

## 2017-04-05 NOTE — Progress Notes (Signed)
HD tx started  

## 2017-04-05 NOTE — Progress Notes (Signed)
Patient refused OOB transfer to chair for HD. Patient c/o of SOB and anxiety with lift use. Notified Case Manager. New order by MD for ativan.

## 2017-04-05 NOTE — Progress Notes (Signed)
[facility]  ROUNDING NOTE   Subjective:   1000 cc removed with second dialysis treatment on 8/21 Patient states that he feels about the same Appetite is a little better but not significantly improved  He states that he continues to feel tired and feels anxious about sitting up   Objective:  Vital signs in last 24 hours:  Temp:  [97.6 F (36.4 C)-98 F (36.7 C)] 98 F (36.7 C) (08/23 1200) Pulse Rate:  [67-81] 78 (08/23 1200) Resp:  [16-20] 19 (08/23 1200) BP: (132-187)/(88-111) 132/88 (08/23 1200) SpO2:  [93 %-100 %] 98 % (08/23 1200)  Weight change:  Filed Weights   03/30/17 2101 04/02/17 1646 04/02/17 1855  Weight: (!) 152.5 kg (336 lb 3.2 oz) (!) 149.1 kg (328 lb 11.3 oz) (!) 147.5 kg (325 lb 2.9 oz)    Intake/Output: I/O last 3 completed shifts: In: -  Out: 200 [Urine:200]   Intake/Output this shift:  Total I/O In: 0  Out: 75 [Urine:75]  Physical Exam: General: NAD, laying in bed  Head: +temporal wasting  Eyes: Anicteric,    Neck: obese  Lungs:  Diminished bilaterally  Heart: Regular rate and rhythm  Abdomen:  Soft, nontender, obese  Extremities: 3+ dependent peripheral edema, left thigh with dressings: +induration  Neurologic: Nonfocal, moving all four extremities, significant generalized muscle wasting  Skin: Prurigo lesions over legs and thigs  Access: Rt IJ temp cath    Basic Metabolic Panel:  Recent Labs Lab 03/31/17 0408 04/01/17 0332 04/02/17 0418 04/03/17 0351 04/05/17 0348  NA 142 141 142 142 142  K 4.6 4.6 4.7 4.4 4.3  CL 107 107 108 107 106  CO2 23 24 25 26 25   GLUCOSE 94 92 100* 108* 84  BUN 102* 102* 108* 97* 96*  CREATININE 6.68* 6.60* 6.81* 6.38* 6.28*  CALCIUM 7.1* 7.2* 7.0* 6.7* 7.1*  PHOS  --  9.1*  --   --   --     Liver Function Tests:  Recent Labs Lab 03/30/17 1749 04/01/17 0332  AST 13* 11*  ALT 15* 13*  ALKPHOS 88 70  BILITOT 0.5 0.5  PROT 7.4 6.7  ALBUMIN 1.7* 1.6*   No results for  input(s): LIPASE, AMYLASE in the last 168 hours. No results for input(s): AMMONIA in the last 168 hours.  CBC:  Recent Labs Lab 03/30/17 1749 03/31/17 0408 04/01/17 0332 04/03/17 0351  WBC 11.0* 11.0* 13.0* 12.5*  NEUTROABS 8.6*  --   --   --   HGB 9.0* 8.9* 8.9* 8.3*  HCT 28.3* 26.2* 28.2* 26.0*  MCV 81.7 82.3 83.1 81.8  PLT 537* 461* 501* 433    Cardiac Enzymes:  Recent Labs Lab 03/30/17 1749  TROPONINI <0.03    BNP: Invalid input(s): POCBNP  CBG:  Recent Labs Lab 04/02/17 2332 04/03/17 0354 04/03/17 0728 04/03/17 1112 04/03/17 1628  GLUCAP 71 96 83 91 93    Microbiology: Results for orders placed or performed during the hospital encounter of 03/30/17  Aerobic/Anaerobic Culture (surgical/deep wound)     Status: Abnormal (Preliminary result)   Collection Time: 03/30/17  7:56 PM  Result Value Ref Range Status   Specimen Description WOUND LEFT THIGH  Final   Special Requests NONE  Final   Gram Stain   Final    ABUNDANT WBC PRESENT, PREDOMINANTLY PMN ABUNDANT GRAM POSITIVE COCCI ABUNDANT GRAM NEGATIVE RODS MODERATE GRAM POSITIVE RODS MODERATE GRAM NEGATIVE COCCI Performed at [facility], [address], [place], [place] [address]  Culture (A)  Final    MULTIPLE ORGANISMS PRESENT, NONE PREDOMINANT NO ANAEROBES ISOLATED; CULTURE IN PROGRESS FOR 5 DAYS    Report Status PENDING  Incomplete  MRSA PCR Screening     Status: Abnormal   Collection Time: 03/30/17  8:58 PM  Result Value Ref Range Status   MRSA by PCR POSITIVE (A) NEGATIVE Final    Comment:        The GeneXpert MRSA Assay (FDA approved for NASAL specimens only), is one component of a comprehensive MRSA colonization surveillance program. It is not intended to diagnose MRSA infection nor to guide or monitor treatment for MRSA infections. RESULT CALLED TO, READ BACK BY AND VERIFIED WITH: KARA CAMPBELL AT 2230 03/30/17.PMH   Body fluid culture     Status: None (Preliminary result)    Collection Time: 04/02/17 11:50 AM  Result Value Ref Range Status   Specimen Description PLEURAL  Final   Special Requests NONE  Final   Gram Stain NO WBC SEEN NO ORGANISMS SEEN   Final   Culture   Final    NO GROWTH 3 DAYS Performed at [facility], [address] [place], [place] [address]    Report Status PENDING  Incomplete  Acid Fast Smear (AFB)     Status: None   Collection Time: 04/02/17 11:50 AM  Result Value Ref Range Status   AFB Specimen Processing Concentration  Final   Acid Fast Smear Negative  Final    Comment: (NOTE) Performed At: [facility] [address] [place], [place] [id number] Mila Homer MD XB:[id number]    Source (AFB) PLEURAL  Final    Coagulation Studies: No results for input(s): LABPROT, INR in the last 72 hours.  Urinalysis: No results for input(s): COLORURINE, LABSPEC, PHURINE, GLUCOSEU, HGBUR, BILIRUBINUR, KETONESUR, PROTEINUR, UROBILINOGEN, NITRITE, LEUKOCYTESUR in the last 72 hours.  Invalid input(s): APPERANCEUR    Imaging: Dg Chest Port 1 View  Result Date: 04/04/2017 CLINICAL DATA:  Follow-up effusion EXAM: PORTABLE CHEST 1 VIEW COMPARISON:  04/03/2017 FINDINGS: Cardiac shadow remains enlarged. Temporary dialysis catheter is again noted on the right. Large bilateral pleural effusions are again seen increasing on the left. No pneumothorax component is identified at this time. Central vascular congestion is noted. No acute bony abnormality is seen. IMPRESSION: Bilateral pleural effusions increasing on the left. Electronically Signed   By: Alcide Clever M.D.   On: 04/04/2017 07:16     Medications:   . albumin human    . ceFEPime (MAXIPIME) IV Stopped (04/04/17 2105)  . vancomycin     . apixaban  5 mg Oral BID  . atorvastatin  20 mg Oral QHS  . bisoprolol  10 mg Oral Daily  . buPROPion  100 mg Oral BID  . calcium acetate  667 mg Oral TID WC  . collagenase   Topical Daily  . docusate sodium  100 mg Oral BID  .  feeding supplement (NEPRO CARB STEADY)  237 mL Oral TID BM  . ferrous sulfate  325 mg Oral Q1200  . irbesartan  75 mg Oral QHS  . levothyroxine  75 mcg Oral QAC breakfast  . midodrine  5 mg Oral TID WC  . multivitamin  1 tablet Oral QHS   acetaminophen **OR** [DISCONTINUED] acetaminophen, hydrALAZINE, ipratropium-albuterol, LORazepam, [DISCONTINUED] ondansetron **OR** ondansetron (ZOFRAN) IV, traMADol, vancomycin  Assessment/ Plan:  Mr. Council Munguia is a 36 y.o. white male with hypertension, morbid obesity, pulmonary hypertension, hypothyroidism, hyperlipidemia, depression/anxiety, coronary artery disease, congestive heart  failure , who was admitted to [facility] on 03/30/2017  1. Acute renal failure on chronic kidney disease stage III with nephrotic range proteinuria, hematuria and glucosuria No baseline creatinine available. Reported creatinine of 1.3 in 2014.  Concern for progression of chronic kidney disease to CKD stage V.  Patient has undiagnosed Nephrotic syndrome. He states he has been dealing with edema for over 3 yeas but did not seek medical care Renal ultrasound report reviewed. HIV negative Not a candidate for kidney biopsy due to body habitus/ in ability to lie prone Hold iv fluids and anti hypertensives - IV albumin - 1st HD on 8/20; PPD has been placed on 8/20 - will probably continue to need chronic dialysis - will look into permacath placement early next week once stable for conscious sedation and infection is treated - Goal is to get him to sit in chair for 3-4 hrs for HD treatment  2. Anasarca, with HTN. Echocardiogram with normal systolic function 8/18. No visualization of hepatic cirrhosis on CT abd/pelvis -  D/c clonidine to allow for higher BP (to help with volume removal with HD) - irbesartan at bedtime  3. Secondary Hyperparathyroidism with hyperphosphatemia.   PTH 60  -   calcium acetate with meals.   4. Anemia of chronic kidney disease: hemoglobin 8.3 -  Oral  iron  5. Generalized weakness with muscle wasting Consider neurology consilt   LOS: 6 Wyett Narine 8/23/201812:35 PM

## 2017-04-05 NOTE — Plan of Care (Signed)
Problem: Activity: Goal: Risk for activity intolerance will decrease Outcome: Not Progressing Patient is bed bound but is working with PT and doing active range of motion in bed.  Problem: Activity: Goal: Activity intolerance will improve Outcome: Not Progressing Patient doing active range of motion in bed with PT.

## 2017-04-05 NOTE — Progress Notes (Signed)
PRE HD assessment 

## 2017-04-05 NOTE — Progress Notes (Signed)
HD TX ended  

## 2017-04-05 NOTE — Progress Notes (Signed)
PRE HD TX 

## 2017-04-05 NOTE — Progress Notes (Addendum)
[facility] [place] [facility]   PATIENT NAME: [name]    MR#:  [id number]  DATE OF BIRTH:  12-May-1981  SUBJECTIVE:     No acute events overnight. Patient did not undergo dialysis but will undergo dialysis today. Continues to have some shortness of breath especially when he lays flat.  REVIEW OF SYSTEMS:    Review of Systems  Constitutional: Negative for fever, chills weight loss  Positive generalized weakness  HENT: Negative for ear pain, nosebleeds, congestion, facial swelling, rhinorrhea, neck pain, neck stiffness and ear discharge.   Respiratory: Negative for cough, ++shortness of breath, no wheezing  Cardiovascular: Negative for chest pain, palpitations and positive leg swelling.  Gastrointestinal: Negative for heartburn, abdominal pain, vomiting, diarrhea or consitpation Genitourinary: Negative for dysuria, urgency, frequency, hematuria Musculoskeletal: Negative for back pain or joint pain Neurological: Negative for dizziness, seizures, syncope, focal weakness,  numbness and headaches.  Hematological: Does not bruise/bleed easily.  Psychiatric/Behavioral: Negative for hallucinations, confusion, dysphoric mood Skin: left thigh wound covered   Tolerating Diet: yes    DRUG ALLERGIES:  No Known Allergies  VITALS:  Blood pressure (!) 135/93, pulse 70, temperature 97.6 F (36.4 C), temperature source Oral, resp. rate 20, height 5\' 9"  (1.753 m), weight (!) 147.5 kg (325 lb 2.9 oz), SpO2 99 %.  PHYSICAL EXAMINATION:  Constitutional: Appears obese anasarca. NAD HENT: Normocephalic. [name] Oropharynx is clear and moist.  Eyes: Conjunctivae and EOM are normal. PERRLA, no scleral icterus.  Neck: Normal ROM. Neck supple. No JVD. No tracheal deviation. CVS: RRR, S1/S2 +, no murmurs, no gallops, no carotid bruit.  Pulmonary: Decreased breath sounds with scant bilateral wheezing at bases Abdominal: abdominal wall edema  Due to body habitus unable to  appreciate for organomegaly BS + no rebound guarding Musculoskeletal: Normal range of motion. +anasarca no tenderness.  Neuro: Alert. CN 2-12 grossly intact. No focal deficits. Skin:left thigh covered wound   Right femoral dialysis catheter placed Psychiatric: Normal mood and affect.      LABORATORY PANEL:   CBC  Recent Labs Lab 04/03/17 0351  WBC 12.5*  HGB 8.3*  HCT 26.0*  PLT 433   ------------------------------------------------------------------------------------------------------------------  Chemistries   Recent Labs Lab 04/01/17 0332  04/05/17 0348  NA 141  < > 142  K 4.6  < > 4.3  CL 107  < > 106  CO2 24  < > 25  GLUCOSE 92  < > 84  BUN 102*  < > 96*  CREATININE 6.60*  < > 6.28*  CALCIUM 7.2*  < > 7.1*  AST 11*  --   --   ALT 13*  --   --   ALKPHOS 70  --   --   BILITOT 0.5  --   --   < > = values in this interval not displayed. ------------------------------------------------------------------------------------------------------------------  Cardiac Enzymes  Recent Labs Lab 03/30/17 1749  TROPONINI <0.03   ------------------------------------------------------------------------------------------------------------------  RADIOLOGY:  Dg Chest Port 1 View  Result Date: 04/04/2017 CLINICAL DATA:  Follow-up effusion EXAM: PORTABLE CHEST 1 VIEW COMPARISON:  04/03/2017 FINDINGS: Cardiac shadow remains enlarged. Temporary dialysis catheter is again noted on the right. Large bilateral pleural effusions are again seen increasing on the left. No pneumothorax component is identified at this time. Central vascular congestion is noted. No acute bony abnormality is seen. IMPRESSION: Bilateral pleural effusions increasing on the left. Electronically Signed   By: Alcide Clever M.D.   On: 04/04/2017 07:16     ASSESSMENT AND  PLAN:   36 year old male with history of chronic diastolic heart failure, primary pulmonary hypertension and hypothyroidism with chronic  kidney disease stage III who presents for [facility] due to shortness of breath.   1. Acute on chronic hypoxic respiratory failure in the setting of fluid overload as well as small pneumothorax After thoracentesis Patient titrated off of BiPAP and on nasal cannula Pneumothorax has resolved  I spoke with pulmonologist who is recommending an chest x-ray in one week which would be next Wednesday.  2 Acute on chronic kidney failure Stage III: Concern is for progression of chronic kidney disease to chronic kidney disease stage V Nephrology consult appreciated. Renal ultrasound showed no hydronephrosis Patient Has started dialysis which was initiated August 21 Not a candidate for kidney biopsy due to body habitus  3.  acute on chronic diastolic heart failure and pulmonary hypertension and anasarca Continue hemodialysis  Echocardiogram shows normal ejection fraction without major valvular abnormalities U/s guideded paracentesis status post 2.8 L of fluid on August 20.  4. Left thigh wound:  Status post bedside debridement day # 5 by surgeryFollow-up final wound cultures  Continue vancomycin and cefepime may stop antibiotics on August 25 Pressure offloading Daily dressing changes with Santyl  4. Essential hypertension: Continue irbesartan and Bisoprolol 5. Hypothyroid: Continue Synthroid 6. Hyperlipidemia: Continue statin 7. 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  CODE STATUS: full  TOTAL TIME TAKING CARE OF THIS PATIENT: 23 minutes.     POSSIBLE D/C 5-6  days, DEPENDING ON CLINICAL CONDITION.   Pascha Fogal M.D on 04/05/2017 at 7:57 AM  Between 7am to 6pm - Pager - [phone] After 6pm go to  [email or web address] - password [email or web address]  [facility]  Office  [phone]  CC: Primary care physician; Patient, No Pcp Per  Note: This dictation was prepared with Dragon dictation along with smaller phrase technology. Any transcriptional errors that result from this process are unintentional.

## 2017-04-05 NOTE — Progress Notes (Addendum)
Pharmacy Antibiotic Note  [name] is a 36 y.o. male admitted on 03/30/2017 with cellulitis and wound infection.  Pharmacy has been consulted for vancomycin and cefepime dosing.  Patient new start HD and has had 2 sessions (8/20 and 8/21).   Patient received vancomycin 1000 mg doses on 8/20 and 8/21.  Patient had hemodialysis on today (8/23); however seems like the dialysis session was only ~ 1.5 to 2 hours based on the dialysis RN note times.  Plan: Will give Vancomycin 500 mg IV x 1 for this hemodialysis since patient did not have complete dialysis session of 4 hours based on information available.   Continue cefepime 1gm iv q24h    Height: 5\' 9"  (175.3 cm) Weight: (!) 325 lb 2.9 oz (147.5 kg) IBW/kg (Calculated) : 70.7  Temp (24hrs), Avg:97.9 F (36.6 C), Min:97.6 F (36.4 C), Max:98 F (36.7 C)   Recent Labs Lab 03/30/17 1749 03/31/17 0408 03/31/17 1516 04/01/17 0332 04/02/17 0418 04/03/17 0351 04/04/17 0246 04/04/17 1716 04/05/17 0348  WBC 11.0* 11.0*  --  13.0*  --  12.5*  --   --   --   CREATININE 6.19* 6.68*  --  6.60* 6.81* 6.38*  --   --  6.28*  VANCOTROUGH  --   --   --   --   --   --  26*  --   --   VANCORANDOM  --   --  18  --   --   --   --  21  --     Estimated Creatinine Clearance: 23.8 mL/min (A) (by C-G formula based on SCr of 6.28 mg/dL (H)).    No Known Allergies  Antimicrobials this admission: 8/17 vancomycin >>  8/17 cefepime >>    Microbiology results: 8/17 Would culture: GPC, GNR, GPR, GNC MRSA PCR: positive    Thank you for allowing pharmacy to be a part of this patient's care.  Demetrius Charity, PharmD, BCPS Clinical Pharmacist 04/05/2017 6:40 PM

## 2017-04-05 NOTE — Progress Notes (Signed)
Physical Therapy Treatment Patient Details Name: [name] MRN: [id number] DOB: 1981/01/10 Today's Date: 04/05/2017    History of Present Illness Pt is a 36 y.o. M who presented to the ED from [facility] with SOB, renal failure, multiple skin wounds, and swelling in B LE. Pt admitted 03/30/2017 with acute on chronic renal failure, acute on chronic respiratory failure with hypoxia, and B pulmonary effusion. Pt s/p R IJ temporary HD catheter placement, s/p debridement of a L thigh wound, and s/p L thoracentesis. PMH: occassional tremors, B lymphedema, obesity, CKD, R cardiac cath (2016), HLD, HTN, major depression, diastolic CHF, primary pulmonary HTN, hypothyroidism, and new dialysis patient.    PT Comments    Pt presented in bed agreeable to supine therex. Participated in supine therex as noted below.  While performing activities, nsg entered and indicated dialysis would like pt to be in chair if possible (transfer via hoyer). Pt adamantly refused stating positioning limits pt ability to breath. Pt with noted increased anxiety when discussing OOB. Discussed with pt progressing HOB elevation throughout day to increase tolerance to upright positioning. Pt would continue to benefit from skilled PT to increase overall tolerance and improve functional mobility.    Follow Up Recommendations  SNF     Equipment Recommendations  None recommended by PT    Recommendations for Other Services       Precautions / Restrictions Restrictions Weight Bearing Restrictions: No    Mobility  Bed Mobility                  Transfers                    Ambulation/Gait                 Stairs            Wheelchair Mobility    Modified Rankin (Stroke Patients Only)       Balance                                            Cognition Arousal/Alertness: Awake/alert Behavior During Therapy: WFL for tasks assessed/performed Overall Cognitive  Status: Within Functional Limits for tasks assessed                                        Exercises Total Joint Exercises Ankle Circles/Pumps: AROM;Both;15 reps Quad Sets: AROM;Strengthening;Both;15 reps;Supine Gluteal Sets: AROM;Strengthening;Both;15 reps;Supine Short Arc Quad: AROM;Strengthening;Both;15 reps Heel Slides: AROM;AAROM;Strengthening;Both;10 reps Hip ABduction/ADduction: AROM;Strengthening;Both;15 reps;Supine Straight Leg Raises: AROM;AAROM;Strengthening;Both;10 reps    General Comments        Pertinent Vitals/Pain Pain Assessment: 0-10 Pain Score: 7  Pain Location: R groin/leg Pain Descriptors / Indicators: Aching;Discomfort Pain Intervention(s): Limited activity within patient's tolerance;Monitored during session;Repositioned    Home Living                      Prior Function            PT Goals (current goals can now be found in the care plan section) Acute Rehab PT Goals PT Goal Formulation: With patient Time For Goal Achievement: 04/18/17 Potential to Achieve Goals: Good    Frequency    Min 2X/week      PT Plan  Co-evaluation              AM-PAC PT "6 Clicks" Daily Activity  Outcome Measure  Difficulty turning over in bed (including adjusting bedclothes, sheets and blankets)?: Unable Difficulty moving from lying on back to sitting on the side of the bed? : Unable Difficulty sitting down on and standing up from a chair with arms (e.g., wheelchair, bedside commode, etc,.)?: Unable Help needed moving to and from a bed to chair (including a wheelchair)?: Total Help needed walking in hospital room?: Total Help needed climbing 3-5 steps with a railing? : Total 6 Click Score: 6    End of Session Equipment Utilized During Treatment: Oxygen Activity Tolerance: Patient tolerated treatment well;Patient limited by pain Patient left: in bed;with call bell/phone within reach   PT Visit Diagnosis: Muscle weakness  (generalized) (M62.81);Difficulty in walking, not elsewhere classified (R26.2)     Time: 6962-9528 PT Time Calculation (min) (ACUTE ONLY): 24 min  Charges:                       G Codes:           Alhaji Mcneal  Maxamillion Banas, PTA 04/05/2017, 1:10 PM

## 2017-04-06 ENCOUNTER — Inpatient Hospital Stay: Payer: Medicaid Other

## 2017-04-06 DIAGNOSIS — R531 Weakness: Secondary | ICD-10-CM

## 2017-04-06 LAB — BASIC METABOLIC PANEL
Anion gap: 11 (ref 5–15)
BUN: 81 mg/dL — AB (ref 6–20)
CHLORIDE: 106 mmol/L (ref 101–111)
CO2: 26 mmol/L (ref 22–32)
CREATININE: 5.8 mg/dL — AB (ref 0.61–1.24)
Calcium: 7.4 mg/dL — ABNORMAL LOW (ref 8.9–10.3)
GFR calc Af Amer: 13 mL/min — ABNORMAL LOW (ref >60)
GFR calc non Af Amer: 12 mL/min — ABNORMAL LOW (ref >60)
Glucose, Bld: 91 mg/dL (ref 65–99)
Potassium: 4.4 mmol/L (ref 3.5–5.1)
SODIUM: 143 mmol/L (ref 135–145)

## 2017-04-06 LAB — CBC
HCT: 27.7 % — ABNORMAL LOW (ref 40.0–52.0)
Hemoglobin: 8.6 g/dL — ABNORMAL LOW (ref 13.0–18.0)
MCH: 25.3 pg — AB (ref 26.0–34.0)
MCHC: 31 g/dL — AB (ref 32.0–36.0)
MCV: 81.6 fL (ref 80.0–100.0)
PLATELETS: 484 10*3/uL — AB (ref 150–440)
RBC: 3.4 MIL/uL — ABNORMAL LOW (ref 4.40–5.90)
RDW: 15.8 % — AB (ref 11.5–14.5)
WBC: 14.3 10*3/uL — AB (ref 3.8–10.6)

## 2017-04-06 LAB — BLOOD GAS, ARTERIAL
Acid-base deficit: 4.7 mmol/L — ABNORMAL HIGH (ref 0.0–2.0)
Bicarbonate: 24.4 mmol/L (ref 20.0–28.0)
FIO2: 0.28
O2 Saturation: 98.6 %
PCO2 ART: 70 mmHg — AB (ref 32.0–48.0)
PH ART: 7.15 — AB (ref 7.350–7.450)
PO2 ART: 145 mmHg — AB (ref 83.0–108.0)
Patient temperature: 37

## 2017-04-06 LAB — GLUCOSE, CAPILLARY
GLUCOSE-CAPILLARY: 98 mg/dL (ref 65–99)
Glucose-Capillary: 95 mg/dL (ref 65–99)

## 2017-04-06 LAB — SEDIMENTATION RATE: Sed Rate: >140 mm/hr — ABNORMAL HIGH (ref 0–15)

## 2017-04-06 LAB — CKMB (ARMC ONLY): CK, MB: 6.8 ng/mL — ABNORMAL HIGH (ref 0.5–5.0)

## 2017-04-06 LAB — TSH: TSH: 11.013 u[IU]/mL — AB (ref 0.350–4.500)

## 2017-04-06 MED ORDER — ALBUMIN HUMAN 25 % IV SOLN
12.5000 g | Freq: Once | INTRAVENOUS | Status: AC
  Administered 2017-04-07: 12.5 g via INTRAVENOUS
  Filled 2017-04-06: qty 50

## 2017-04-06 MED ORDER — CLONIDINE HCL 0.1 MG PO TABS
0.2000 mg | ORAL_TABLET | Freq: Two times a day (BID) | ORAL | Status: DC
  Filled 2017-04-06: qty 2

## 2017-04-06 MED ORDER — IRBESARTAN 150 MG PO TABS
300.0000 mg | ORAL_TABLET | Freq: Every day | ORAL | Status: DC
  Administered 2017-04-08 – 2017-04-15 (×8): 300 mg via ORAL
  Filled 2017-04-06 (×8): qty 2

## 2017-04-06 MED ORDER — TRAMADOL HCL 50 MG PO TABS
50.0000 mg | ORAL_TABLET | Freq: Two times a day (BID) | ORAL | Status: DC | PRN
  Administered 2017-04-08 – 2017-04-14 (×5): 50 mg via ORAL
  Filled 2017-04-06 (×5): qty 1

## 2017-04-06 NOTE — Care Management (Signed)
PPD needs to be read. Patient has an outpatient chair for dialysis just need the PPD

## 2017-04-06 NOTE — Progress Notes (Addendum)
[employer] - [place] [facility]   PATIENT NAME: [name]    MR#:  [id number]  DATE OF BIRTH:  July 20, 1981  SUBJECTIVE:     Generalized weakness and some shortness of breath. On O2 [place] 4L.  REVIEW OF SYSTEMS:    Review of Systems  Constitutional: Negative for fever, chills weight loss  Positive generalized weakness  HENT: Negative for ear pain, nosebleeds, congestion, facial swelling, rhinorrhea, neck pain, neck stiffness and ear discharge.   Respiratory: Negative for cough, ++shortness of breath, no wheezing  Cardiovascular: Negative for chest pain, palpitations and positive leg swelling.  Gastrointestinal: Negative for heartburn, abdominal pain, vomiting, diarrhea or consitpation Genitourinary: Negative for dysuria, urgency, frequency, hematuria Musculoskeletal: Negative for back pain or joint pain Neurological: Negative for dizziness, seizures, syncope, focal weakness,  numbness and headaches.  Hematological: Does not bruise/bleed easily.  Psychiatric/Behavioral: Negative for hallucinations, confusion, dysphoric mood Skin: left thigh wound covered   Tolerating Diet: yes    DRUG ALLERGIES:  No Known Allergies  VITALS:  Blood pressure (!) 163/92, pulse 74, temperature 97.8 F (36.6 C), temperature source Oral, resp. rate (!) 21, height 5\' 9"  (1.753 m), weight (!) 325 lb 2.9 oz (147.5 kg), SpO2 98 %.  PHYSICAL EXAMINATION:  Constitutional: Appears obese anasarca. NAD. Morbid obesity. HENT: Normocephalic. [name] Oropharynx is clear and moist.  Eyes: Conjunctivae and EOM are normal. PERRLA, no scleral icterus.  Neck: Normal ROM. Neck supple. No JVD. No tracheal deviation. CVS: RRR, S1/S2 +, no murmurs, no gallops, no carotid bruit.  Pulmonary: Decreased breath sounds with scant bilateral wheezing at bases Abdominal: abdominal wall edema  Due to body habitus unable to appreciate for organomegaly BS + no rebound guarding Musculoskeletal: Normal range of  motion. +anasarca no tenderness.   Neuro: Alert. CN 2-12 grossly intact. No focal deficits. Power 0-1/5 in lower extremities and 3-4/5 in upper extremities. Skin:left thigh covered wound   Right femoral dialysis catheter placed Psychiatric: Normal mood and affect.      LABORATORY PANEL:   CBC  Recent Labs Lab 04/06/17 0402  WBC 14.3*  HGB 8.6*  HCT 27.7*  PLT 484*   ------------------------------------------------------------------------------------------------------------------  Chemistries   Recent Labs Lab 04/01/17 0332  04/06/17 0402  NA 141  < > 143  K 4.6  < > 4.4  CL 107  < > 106  CO2 24  < > 26  GLUCOSE 92  < > 91  BUN 102*  < > 81*  CREATININE 6.60*  < > 5.80*  CALCIUM 7.2*  < > 7.4*  AST 11*  --   --   ALT 13*  --   --   ALKPHOS 70  --   --   BILITOT 0.5  --   --   < > = values in this interval not displayed. ------------------------------------------------------------------------------------------------------------------  Cardiac Enzymes  Recent Labs Lab 03/30/17 1749  TROPONINI <0.03   ------------------------------------------------------------------------------------------------------------------  RADIOLOGY:  No results found.   ASSESSMENT AND PLAN:   36 year old male with history of chronic diastolic heart failure, primary pulmonary hypertension and hypothyroidism with chronic kidney disease stage III who presents for [facility] due to shortness of breath.   1. Acute on chronic hypoxic respiratory failure with hypoxia in the setting of fluid overload as well as small pneumothorax After thoracentesis Patient titrated off of BiPAP and on nasal cannula 4L. Pneumothorax has resolved  Pulmonologist recommended an chest x-ray in one week which would be next Wednesday.  2 Acute on  chronic kidney failure Stage III:  progression of chronic kidney disease to chronic kidney disease stage V Nephrology consult appreciated. Renal  ultrasound showed no hydronephrosis Patient Has started dialysis which was initiated August 20. Not a candidate for kidney biopsy due to body habitus Per Dr. Thedore Mins, will probably continue to need chronic dialysis.  3.  acute on chronic diastolic heart failure and pulmonary hypertension and anasarca Continue hemodialysis  Echocardiogram shows normal ejection fraction without major valvular abnormalities U/s guideded paracentesis status post 2.8 L of fluid on August 20.  4. Left thigh wound:  Status post bedside debridement day # 6 by surgery. MULTIPLE ORGANISMS PRESENT, NONE PREDOMINANT. NO ANAEROBES ISOLATED per wound cultures.  Continue vancomycin and cefepime may stop antibiotics on August 25 Pressure offloading Daily dressing changes with Santyl.  4. Accelerated Essential hypertension: Continue irbesartan and Bisoprolol, resume Clonidine due to accelerated hypertension.  5. Hypothyroid: Continue Synthroid 6. Hyperlipidemia: Continue statin 7. 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 Hemoglobin is stable.  Generalized weakness with muscle wasting Hold statin. Per Dr. Thad Ranger, may need EMG as outpatient.  Physical therapy is recommending the patient return back to skilled nursing facility once stable for discharge Management plans discussed with the patient and he is in agreement.  CODE STATUS: full  TOTAL TIME TAKING CARE OF THIS PATIENT: 35 minutes.   POSSIBLE D/C 3-4  days, DEPENDING ON CLINICAL CONDITION.   Shaune Pollack M.D on 04/06/2017 at 4:28 PM  Between 7am to 6pm - Pager - [phone] After 6pm go to [email or web address] - password [email or web address]  [facility]  Office  [phone]  CC: Primary care physician; Patient, No Pcp Per  Note: This dictation was prepared with Dragon dictation along with smaller phrase technology. Any  transcriptional errors that result from this process are unintentional.

## 2017-04-06 NOTE — Progress Notes (Signed)
NIF >50cmh20

## 2017-04-06 NOTE — Consult Note (Addendum)
Reason for Consult:Weakness Referring Physician: Bridgett Larsson  CC: Weakness  HPI: [name] is an 36 y.o. male with morbid obesity who presented to the ED due to SOB and ARF.  Reports has been weak for the past 3 years.  Weakness has been progressive and generalized.  Reports being bed bound for the past two months.  Has SOB.  Voice has changed.  Has difficulty sleeping at night so is therefore fatigued during the day and sleeps a lot during the day.  Reports no diplopia.  Only difficulty swallowing is due to breathing.  Weak cough.  Consult called for recommendations.     Past Medical History:  Diagnosis Date  . CKD (chronic kidney disease)   . Depression   . Diastolic CHF ([place])   . Generalized anxiety disorder   . Hyperlipidemia   . Hypertension   . Hypertension   . Hypothyroidism   . Lymphedema   . Obesity   . Pulmonary hypertension ([place])     Past Surgical History:  Procedure Laterality Date  . CARDIAC CATHETERIZATION    . DIALYSIS/PERMA CATHETER INSERTION N/A 04/02/2017   Procedure: DIALYSIS/PERMA CATHETER INSERTION;  Surgeon: Algernon Huxley, MD;  Location: [facility];  Service: Cardiovascular;  Laterality: N/A;    Family History  Problem Relation Age of Onset  . Hypertension Mother   Mother with multiple miscarriages  Social History:  reports that he is a non-smoker but has been exposed to tobacco smoke. He has never used smokeless tobacco. He reports that he does not drink alcohol. His drug history is not on file.  No Known Allergies  Medications:  I have reviewed the patient's current medications. Prior to Admission:  Prescriptions Prior to Admission  Medication Sig Dispense Refill Last Dose  . amLODipine (NORVASC) 10 MG tablet Take 10 mg by mouth daily.   03/30/2017 at 0800  . amLODipine (NORVASC) 5 MG tablet Take 5 mg by mouth daily.   03/30/2017 at 0800  . apixaban (ELIQUIS) 5 MG TABS tablet Take 5 mg by mouth 2 (two) times daily.   03/30/2017 at 0800  . aspirin  325 MG EC tablet Take 325 mg by mouth daily.   03/30/2017 at 0800  . atorvastatin (LIPITOR) 20 MG tablet Take 20 mg by mouth at bedtime.   03/29/2017 at 2100  . bisoprolol (ZEBETA) 10 MG tablet Take 10 mg by mouth daily.   03/30/2017 at 0800  . buPROPion (WELLBUTRIN) 100 MG tablet Take 100 mg by mouth 2 (two) times daily.   03/30/2017 at 0800  . cloNIDine (CATAPRES) 0.2 MG tablet Take 0.2 mg by mouth 2 (two) times daily.   03/30/2017 at 0800  . collagenase (SANTYL) ointment Apply 1 application topically daily.   Past Week  . hydrALAZINE (APRESOLINE) 50 MG tablet Take 50 mg by mouth 3 (three) times daily.   03/30/2017 at 0800  . levothyroxine (SYNTHROID, LEVOTHROID) 75 MCG tablet Take 75 mcg by mouth daily before breakfast.   03/30/2017 at 0800  . metolazone (ZAROXOLYN) 2.5 MG tablet Take 2.5 mg by mouth every other day.   Past Week at 0800  . nystatin (MYCOSTATIN/NYSTOP) powder Apply topically 2 (two) times daily.   03/30/2017 at 0800  . potassium chloride SA (K-DUR,KLOR-CON) 20 MEQ tablet Take 20 mEq by mouth every other day.   Past Week at 0800  . ondansetron (ZOFRAN) 8 MG tablet Take by mouth every 8 (eight) hours as needed for nausea or vomiting.   prn at prn  .  traMADol (ULTRAM) 50 MG tablet Take 50 mg by mouth every 8 (eight) hours as needed.   prn at prn   Scheduled: . apixaban  5 mg Oral BID  . bisoprolol  10 mg Oral Daily  . buPROPion  100 mg Oral BID  . calcium acetate  667 mg Oral TID WC  . cloNIDine  0.2 mg Oral BID  . collagenase   Topical Daily  . docusate sodium  100 mg Oral BID  . feeding supplement (NEPRO CARB STEADY)  237 mL Oral TID BM  . ferrous sulfate  325 mg Oral Q1200  . irbesartan  300 mg Oral QHS  . levothyroxine  75 mcg Oral QAC breakfast  . multivitamin  1 tablet Oral QHS    ROS: History obtained from the patient  General ROS: fatigue, weight loss Psychological ROS: negative for - behavioral disorder, hallucinations, memory difficulties, mood swings or suicidal  ideation Ophthalmic ROS: negative for - blurry vision, double vision, eye pain or loss of vision ENT ROS: negative for - epistaxis, nasal discharge, oral lesions, sore throat, tinnitus or vertigo Allergy and Immunology ROS: negative for - hives or itchy/watery eyes Hematological and Lymphatic ROS: negative for - bleeding problems, bruising or swollen lymph nodes Endocrine ROS: negative for - galactorrhea, hair pattern changes, polydipsia/polyuria or temperature intolerance Respiratory ROS:  shortness of breath Cardiovascular ROS:  edema  Gastrointestinal ROS: negative for - abdominal pain, diarrhea, hematemesis, nausea/vomiting or stool incontinence Genito-Urinary ROS: negative for - dysuria, hematuria, incontinence or urinary frequency/urgency Musculoskeletal ROS: negative for - joint swelling or muscular weakness Neurological ROS: as noted in HPI Dermatological ROS: rash on legs  Physical Examination: Blood pressure (!) 163/92, pulse 74, temperature 97.8 F (36.6 C), temperature source Oral, resp. rate (!) 21, height 5' 9"  (1.753 m), weight (!) 147.5 kg (325 lb 2.9 oz), SpO2 98 %.  HEENT-  Normocephalic, no lesions, without obvious abnormality.  Normal external eye and conjunctiva.  Normal TM's bilaterally.  Normal auditory canals and external ears. Normal external nose, mucus membranes and septum.  Normal pharynx.  Temporal wasting. Cardiovascular- S1, S2 normal, pulses palpable throughout   Lungs- chest clear, no wheezing, rales, normal symmetric air entry Abdomen- soft, non-tender; bowel sounds normal; no masses,  no organomegaly Extremities- pitting edema in the legs that extends into the abdomen Lymph-no adenopathy palpable Musculoskeletal-no joint tenderness, deformity or swelling Skin-erythmatous rash on the lower extremities  Neurological Examination   Mental Status: Alert, oriented, thought content appropriate.  Speech fluent without evidence of aphasia.  Able to follow 3 step  commands without difficulty. Cranial Nerves: II: Discs flat bilaterally; Visual fields grossly normal, pupils equal, round, reactive to light and accommodation III,IV, VI: ptosis not present but with some possible fatigue, extra-ocular motions intact bilaterally V,VII: smile symmetric but difficult to maintain, facial light touch sensation normal bilaterally VIII: hearing normal bilaterally IX,X: gag reflex present XI: bilateral shoulder shrug XII: midline tongue extension Motor: Patient able to lift all extremities against gravity at 5-/5 strength Sensory: Pinprick and light touch intact throughout, bilaterally Deep Tendon Reflexes: 2+ and symmetric throughout Plantars: Right: downgoing   Left: downgoing Cerebellar: normal finger-to-nose testing bilaterally Gait: unable to test due to weakness   Laboratory Studies:   Basic Metabolic Panel:  Recent Labs Lab 04/01/17 0332 04/02/17 0418 04/03/17 0351 04/05/17 0348 04/06/17 0402  NA 141 142 142 142 143  K 4.6 4.7 4.4 4.3 4.4  CL 107 108 107 106 106  CO2 24 25 26  25 26  GLUCOSE 92 100* 108* 84 91  BUN 102* 108* 97* 96* 81*  CREATININE 6.60* 6.81* 6.38* 6.28* 5.80*  CALCIUM 7.2* 7.0* 6.7* 7.1* 7.4*  PHOS 9.1*  --   --   --   --     Liver Function Tests:  Recent Labs Lab 03/30/17 1749 04/01/17 0332  AST 13* 11*  ALT 15* 13*  ALKPHOS 88 70  BILITOT 0.5 0.5  PROT 7.4 6.7  ALBUMIN 1.7* 1.6*   No results for input(s): LIPASE, AMYLASE in the last 168 hours. No results for input(s): AMMONIA in the last 168 hours.  CBC:  Recent Labs Lab 03/30/17 1749 03/31/17 0408 04/01/17 0332 04/03/17 0351 04/06/17 0402  WBC 11.0* 11.0* 13.0* 12.5* 14.3*  NEUTROABS 8.6*  --   --   --   --   HGB 9.0* 8.9* 8.9* 8.3* 8.6*  HCT 28.3* 26.2* 28.2* 26.0* 27.7*  MCV 81.7 82.3 83.1 81.8 81.6  PLT 537* 461* 501* 433 484*    Cardiac Enzymes:  Recent Labs Lab 03/30/17 1749 04/05/17 2334  CKMB  --  6.8*  TROPONINI <0.03  --      BNP: Invalid input(s): POCBNP  CBG:  Recent Labs Lab 04/02/17 2332 04/03/17 0354 04/03/17 0728 04/03/17 1112 04/03/17 1628  GLUCAP 71 96 83 91 93    Microbiology: Results for orders placed or performed during the hospital encounter of 03/30/17  Aerobic/Anaerobic Culture (surgical/deep wound)     Status: Abnormal   Collection Time: 03/30/17  7:56 PM  Result Value Ref Range Status   Specimen Description WOUND LEFT THIGH  Final   Special Requests NONE  Final   Gram Stain   Final    ABUNDANT WBC PRESENT, PREDOMINANTLY PMN ABUNDANT GRAM POSITIVE COCCI ABUNDANT GRAM NEGATIVE RODS MODERATE GRAM POSITIVE RODS MODERATE GRAM NEGATIVE COCCI    Culture (A)  Final    MULTIPLE ORGANISMS PRESENT, NONE PREDOMINANT NO ANAEROBES ISOLATED Performed at [facility], [place] [address], [place], [place] [address]    Report Status 04/05/2017 FINAL  Final  MRSA PCR Screening     Status: Abnormal   Collection Time: 03/30/17  8:58 PM  Result Value Ref Range Status   MRSA by PCR POSITIVE (A) NEGATIVE Final    Comment:        The GeneXpert MRSA Assay (FDA approved for NASAL specimens only), is one component of a comprehensive MRSA colonization surveillance program. It is not intended to diagnose MRSA infection nor to guide or monitor treatment for MRSA infections. RESULT CALLED TO, READ BACK BY AND VERIFIED WITH: KARA CAMPBELL AT 2230 03/30/17.PMH   Body fluid culture     Status: None   Collection Time: 04/02/17 11:50 AM  Result Value Ref Range Status   Specimen Description PLEURAL  Final   Special Requests NONE  Final   Gram Stain NO WBC SEEN NO ORGANISMS SEEN   Final   Culture   Final    NO GROWTH 3 DAYS Performed at [facility], [address], [place],  [address]    Report Status 04/05/2017 FINAL  Final  Acid Fast Smear (AFB)     Status: None   Collection Time: 04/02/17 11:50 AM  Result Value Ref Range Status   AFB Specimen Processing Concentration   Final   Acid Fast Smear Negative  Final    Comment: (NOTE) Performed At: [facility] [place], [place] [id number] Lindon Romp MD FH:[id number]  Source (AFB) PLEURAL  Final    Coagulation Studies: No results for input(s): LABPROT, INR in the last 72 hours.  Urinalysis:  Recent Labs Lab 03/31/17 1330  COLORURINE YELLOW*  LABSPEC 1.011  PHURINE 5.0  GLUCOSEU 150*  HGBUR SMALL*  BILIRUBINUR NEGATIVE  KETONESUR NEGATIVE  PROTEINUR >=300*  NITRITE NEGATIVE  LEUKOCYTESUR NEGATIVE    Lipid Panel:  No results found for: CHOL, TRIG, HDL, CHOLHDL, VLDL, LDLCALC  HgbA1C: No results found for: HGBA1C  Urine Drug Screen:  No results found for: LABOPIA, COCAINSCRNUR, LABBENZ, AMPHETMU, THCU, LABBARB  Alcohol Level: No results for input(s): ETH in the last 168 hours.  Other results: EKG: sinus tachycardia at 112 bpm.  Imaging: No results found.   Assessment/Plan: 36 year old male with progressive weakness.   Suspect weakness is multifactorial but patient also with some findings that may suggest a neuromuscular disorder.  Further work up recommended.    Recommendations: 1. CK, ESR, TSH, myasthenia panel 2.  Muscle biopsy to be performed by general surgery-deltoid likely site.  Patient will have to be discontinued from Eliquis. 3.  NIF,VC 4.  NCV/EMG to be performed as an outpatient with outpatient neurology follow up. 5.  D/C statin    Alexis Goodell, MD Neurology [phone] 04/06/2017, 1:54 PM

## 2017-04-06 NOTE — Progress Notes (Signed)
Physical Therapy Treatment Patient Details Name: [name] MRN: [id number] DOB: 01/20/1981 Today's Date: 04/06/2017    History of Present Illness Pt is a 36 y.o. M who presented to the ED from [facility] with SOB, renal failure, multiple skin wounds, and swelling in B LE. Pt admitted 03/30/2017 with acute on chronic renal failure, acute on chronic respiratory failure with hypoxia, and B pulmonary effusion. Pt s/p R IJ temporary HD catheter placement, s/p debridement of a L thigh wound, and s/p L thoracentesis. PMH: occassional tremors, B lymphedema, obesity, CKD, R cardiac cath (2016), HLD, HTN, major depression, diastolic CHF, primary pulmonary HTN, hypothyroidism, and new dialysis patient.    PT Comments    Upon PT entrance pt difficult to awake and very delayed in responding to questions asked. Pt able to perform bed exercises last PT session with minimal to no assist; pt much slower with increased assist required for bed exercises attempted during today's session. Pt presents with increased labored breathing and paleness compared to initial PT evaluation (2 days prior). Pt reports 1-2/10 B LE pain throughout entire session. BP 168/115 mmHg and 171/102 mmHg in supine; HR 71-74 bpm throughout session in supine; O2 99% on 4L [place] throughout session via dinamap. Nursing had requested PT get pt up to chair (pt needs sitting tolerance assessment in chair for dialysis) but due to concerns of alertness and respiratory status (medical appropriateness for transfer to chair) therapy called in PT supervisor, charge nurse and pt's current nurse. Medical appropriateness (above noted concerns) discussed with nursing staff and charge nurse recommended holding [place] transfer to chair at this time. [place]oyer sling/pad left in pt's room for nursing staff to use when pt is more alert and medically appropriate for [place] transfer to chair. Staff educated on increasing/elevating HOB for increased tolerance to upright  position as appropriate. Next session focus on strengthening and progressing functional mobility.   Follow Up Recommendations  SNF     Equipment Recommendations  None recommended by PT    Recommendations for Other Services       Precautions / Restrictions Precautions Precautions: Fall;Other (comment) Precaution Comments: R IJ temporary HD cath restrictions (per discussion with Dr Thedore Mins 04/06/17 MD cleared pt for OOB to chair mobility) Restrictions Weight Bearing Restrictions: No    Mobility  Bed Mobility Overal bed mobility: Needs Assistance Bed Mobility: Rolling Rolling:  (+4)         General bed mobility comments: +4 to roll (left and right) to readjust blankets and padding underneath patient  Transfers                 General transfer comment: deferred due to safety concerns  Ambulation/Gait             General Gait Details: deferred due to safety concerns   Stairs            Wheelchair Mobility    Modified Rankin (Stroke Patients Only)       Balance Overall balance assessment:  (deferred due to safety concerns)                                          Cognition Arousal/Alertness: Lethargic Behavior During Therapy: Flat affect Overall Cognitive Status: Within Functional Limits for tasks assessed  Exercises Total Joint Exercises Ankle Circles/Pumps: AROM;Both;10 reps;Supine (minimal ROM and increased time to perform; suspect due to lethargy) Heel Slides: AAROM;Both;10 reps;Supine (minimal ROM and increased time to perform; suspect due to lethargy)    General Comments        Pertinent Vitals/Pain Pain Assessment: 0-10 Pain Score: 2  Pain Location: B LE Pain Descriptors / Indicators: Aching;Discomfort Pain Intervention(s): Monitored during session;Premedicated before session;Repositioned    Home Living                      Prior Function             PT Goals (current goals can now be found in the care plan section) Acute Rehab PT Goals Patient Stated Goal: to get stronger PT Goal Formulation: With patient Time For Goal Achievement: 04/18/17 Potential to Achieve Goals: Good Progress towards PT goals: PT to reassess next treatment (unable to fully assess due to fatigue and lethargy)    Frequency    Min 2X/week      PT Plan Current plan remains appropriate    Co-evaluation              AM-PAC PT "6 Clicks" Daily Activity  Outcome Measure  Difficulty turning over in bed (including adjusting bedclothes, sheets and blankets)?: Unable Difficulty moving from lying on back to sitting on the side of the bed? : Unable Difficulty sitting down on and standing up from a chair with arms (e.g., wheelchair, bedside commode, etc,.)?: Unable Help needed moving to and from a bed to chair (including a wheelchair)?: Total Help needed walking in hospital room?: Total Help needed climbing 3-5 steps with a railing? : Total 6 Click Score: 6    End of Session Equipment Utilized During Treatment: Oxygen Activity Tolerance: Patient limited by fatigue;Patient limited by lethargy Patient left: in bed;with call bell/phone within reach;with bed alarm set Nurse Communication: Mobility status;Precautions;Need for lift equipment PT Visit Diagnosis: Muscle weakness (generalized) (M62.81);Difficulty in walking, not elsewhere classified (R26.2)     Time: 1445-1530 PT Time Calculation (min) (ACUTE ONLY): 45 min  Charges:                       G CodesGwenlyn Found, SPT 05-05-17,4:27 PM [phone]

## 2017-04-06 NOTE — Plan of Care (Signed)
Problem: Activity: Goal: Risk for activity intolerance will decrease Outcome: Not Progressing Patient is bed bound.  Problem: Activity: Goal: Activity intolerance will improve Outcome: Not Progressing Patient is bed bound.

## 2017-04-06 NOTE — Progress Notes (Signed)
Pharmacy Antibiotic Note  [name] is a 36 y.o. male admitted on 03/30/2017 with cellulitis and wound infection.  Pharmacy has been consulted for vancomycin and cefepime dosing.  Patient new start HD and has had 2 sessions (8/20 and 8/21).   Patient received vancomycin 1000 mg doses on 8/20 and 8/21.  Patient had hemodialysis on yesterday (8/23); however seems like the dialysis session was only ~ 1.5 to 2 hours based on the dialysis RN note times.   Plan: Patient does not seem like he is getting HD based on nephrology note. Will continue to follow and dose vancomycin when clinically indicated.   Continue cefepime 1gm iv q24h    Height: 5\' 9"  (175.3 cm) Weight: (!) 325 lb 2.9 oz (147.5 kg) IBW/kg (Calculated) : 70.7  Temp (24hrs), Avg:97.8 F (36.6 C), Min:97.4 F (36.3 C), Max:98 F (36.7 C)   Recent Labs Lab 03/30/17 1749 03/31/17 0408 03/31/17 1516 04/01/17 0332 04/02/17 0418 04/03/17 0351 04/04/17 0246 04/04/17 1716 04/05/17 0348 04/06/17 0402  WBC 11.0* 11.0*  --  13.0*  --  12.5*  --   --   --  14.3*  CREATININE 6.19* 6.68*  --  6.60* 6.81* 6.38*  --   --  6.28* 5.80*  VANCOTROUGH  --   --   --   --   --   --  26*  --   --   --   VANCORANDOM  --   --  18  --   --   --   --  21  --   --     Estimated Creatinine Clearance: 25.7 mL/min (A) (by C-G formula based on SCr of 5.8 mg/dL (H)).    No Known Allergies  Antimicrobials this admission: 8/17 vancomycin >>  8/17 cefepime >>    Microbiology results: 8/17 Would culture: GPC, GNR, GPR, GNC MRSA PCR: positive    Thank you for allowing pharmacy to be a part of this patient's care.  [facility] resident  04/06/2017 2:16 PM

## 2017-04-06 NOTE — Progress Notes (Signed)
TB skin test no induration noted TB skin test negative

## 2017-04-06 NOTE — Progress Notes (Signed)
[facility]  ROUNDING NOTE   Subjective:   1000 cc removed with second dialysis treatment on 8/21, 2000 cc removed on 8/23 Patient states that he feels about the same; still anxious about breathing Appetite not significantly improved    Objective:  Vital signs in last 24 hours:  Temp:  [97.4 F (36.3 C)-98 F (36.7 C)] 97.8 F (36.6 C) (08/24 1130) Pulse Rate:  [67-79] 74 (08/24 1130) Resp:  [20-22] 21 (08/24 1130) BP: (144-176)/(92-118) 163/92 (08/24 1130) SpO2:  [97 %-99 %] 98 % (08/24 1130) Weight:  [147.5 kg (325 lb 2.9 oz)] 147.5 kg (325 lb 2.9 oz) (08/23 1525)  Weight change:  Filed Weights   04/02/17 1646 04/02/17 1855 04/05/17 1525  Weight: (!) 149.1 kg (328 lb 11.3 oz) (!) 147.5 kg (325 lb 2.9 oz) (!) 147.5 kg (325 lb 2.9 oz)    Intake/Output: I/O last 3 completed shifts: In: 0  Out: 2150 [Urine:150; Other:2000]   Intake/Output this shift:  No intake/output data recorded.  Physical Exam: General: NAD, laying in bed  Head: +temporal wasting  Eyes: Anicteric,    Neck: obese  Lungs:  Diminished bilaterally  Heart: Regular rate and rhythm  Abdomen:  Soft, nontender, obese  Extremities: 2-3+ dependent peripheral edema, left thigh with dressings: +induration  Neurologic: significant generalized muscle wasting  Skin: Prurigo lesions over legs and thigs  Access: Rt IJ temp cath    Basic Metabolic Panel:  Recent Labs Lab 04/01/17 0332 04/02/17 0418 04/03/17 0351 04/05/17 0348 04/06/17 0402  NA 141 142 142 142 143  K 4.6 4.7 4.4 4.3 4.4  CL 107 108 107 106 106  CO2 24 25 26 25 26   GLUCOSE 92 100* 108* 84 91  BUN 102* 108* 97* 96* 81*  CREATININE 6.60* 6.81* 6.38* 6.28* 5.80*  CALCIUM 7.2* 7.0* 6.7* 7.1* 7.4*  PHOS 9.1*  --   --   --   --     Liver Function Tests:  Recent Labs Lab 03/30/17 1749 04/01/17 0332  AST 13* 11*  ALT 15* 13*  ALKPHOS 88 70  BILITOT 0.5 0.5  PROT 7.4 6.7  ALBUMIN 1.7* 1.6*   No results for  input(s): LIPASE, AMYLASE in the last 168 hours. No results for input(s): AMMONIA in the last 168 hours.  CBC:  Recent Labs Lab 03/30/17 1749 03/31/17 0408 04/01/17 0332 04/03/17 0351 04/06/17 0402  WBC 11.0* 11.0* 13.0* 12.5* 14.3*  NEUTROABS 8.6*  --   --   --   --   HGB 9.0* 8.9* 8.9* 8.3* 8.6*  HCT 28.3* 26.2* 28.2* 26.0* 27.7*  MCV 81.7 82.3 83.1 81.8 81.6  PLT 537* 461* 501* 433 484*    Cardiac Enzymes:  Recent Labs Lab 03/30/17 1749 04/05/17 2334  CKMB  --  6.8*  TROPONINI <0.03  --     BNP: Invalid input(s): POCBNP  CBG:  Recent Labs Lab 04/02/17 2332 04/03/17 0354 04/03/17 0728 04/03/17 1112 04/03/17 1628  GLUCAP 71 96 83 91 93    Microbiology: Results for orders placed or performed during the hospital encounter of 03/30/17  Aerobic/Anaerobic Culture (surgical/deep wound)     Status: Abnormal   Collection Time: 03/30/17  7:56 PM  Result Value Ref Range Status   Specimen Description WOUND LEFT THIGH  Final   Special Requests NONE  Final   Gram Stain   Final    ABUNDANT WBC PRESENT, PREDOMINANTLY PMN ABUNDANT GRAM POSITIVE COCCI ABUNDANT GRAM NEGATIVE RODS MODERATE GRAM POSITIVE RODS  MODERATE GRAM NEGATIVE COCCI    Culture (A)  Final    MULTIPLE ORGANISMS PRESENT, NONE PREDOMINANT NO ANAEROBES ISOLATED Performed at [facility], [address] [place], [place] [address]    Report Status 04/05/2017 FINAL  Final  MRSA PCR Screening     Status: Abnormal   Collection Time: 03/30/17  8:58 PM  Result Value Ref Range Status   MRSA by PCR POSITIVE (A) NEGATIVE Final    Comment:        The GeneXpert MRSA Assay (FDA approved for NASAL specimens only), is one component of a comprehensive MRSA colonization surveillance program. It is not intended to diagnose MRSA infection nor to guide or monitor treatment for MRSA infections. RESULT CALLED TO, READ BACK BY AND VERIFIED WITH: KARA CAMPBELL AT 2230 03/30/17.PMH   Body fluid culture      Status: None   Collection Time: 04/02/17 11:50 AM  Result Value Ref Range Status   Specimen Description PLEURAL  Final   Special Requests NONE  Final   Gram Stain NO WBC SEEN NO ORGANISMS SEEN   Final   Culture   Final    NO GROWTH 3 DAYS Performed at [facility], [address], [place], [place] [address]    Report Status 04/05/2017 FINAL  Final  Acid Fast Smear (AFB)     Status: None   Collection Time: 04/02/17 11:50 AM  Result Value Ref Range Status   AFB Specimen Processing Concentration  Final   Acid Fast Smear Negative  Final    Comment: (NOTE) Performed At: [facility] [address] [place], [place] [id number] Mila Homer MD [id number]    Source (AFB) PLEURAL  Final    Coagulation Studies: No results for input(s): LABPROT, INR in the last 72 hours.  Urinalysis: No results for input(s): COLORURINE, LABSPEC, PHURINE, GLUCOSEU, HGBUR, BILIRUBINUR, KETONESUR, PROTEINUR, UROBILINOGEN, NITRITE, LEUKOCYTESUR in the last 72 hours.  Invalid input(s): APPERANCEUR    Imaging: No results found.   Medications:   . albumin human    . ceFEPime (MAXIPIME) IV Stopped (04/05/17 2024)  . vancomycin     . apixaban  5 mg Oral BID  . atorvastatin  20 mg Oral QHS  . bisoprolol  10 mg Oral Daily  . buPROPion  100 mg Oral BID  . calcium acetate  667 mg Oral TID WC  . collagenase   Topical Daily  . docusate sodium  100 mg Oral BID  . feeding supplement (NEPRO CARB STEADY)  237 mL Oral TID BM  . ferrous sulfate  325 mg Oral Q1200  . irbesartan  75 mg Oral QHS  . levothyroxine  75 mcg Oral QAC breakfast  . midodrine  5 mg Oral TID WC  . multivitamin  1 tablet Oral QHS   acetaminophen **OR** [DISCONTINUED] acetaminophen, hydrALAZINE, ipratropium-albuterol, LORazepam, [DISCONTINUED] ondansetron **OR** ondansetron (ZOFRAN) IV, traMADol, vancomycin  Assessment/ Plan:  [name] is a 36 y.o. white male with hypertension, morbid obesity, pulmonary  hypertension, hypothyroidism, hyperlipidemia, depression/anxiety, coronary artery disease, congestive heart failure , who was admitted to [facility] on 03/30/2017  1. Acute renal failure on chronic kidney disease stage III with nephrotic range proteinuria, hematuria and glucosuria No baseline creatinine available. Creatinine of 1.3 in 12/2014 (care everywhere).  Concern for progression of chronic kidney disease to CKD stage V.  Patient has Nephrotic syndrome of unknown etiology. He states he has been dealing with edema for over 2-3 yeas but did not  seek medical care. Renal ultrasound report reviewed. HIV negative Not a candidate for kidney biopsy due to body habitus/ in ability to lie prone Hold iv fluids and anti hypertensives - IV albumin with HD - 1st HD on 8/20; PPD has been placed on 8/20 - will probably continue to need chronic dialysis - will look into permacath placement early next week once stable for conscious sedation and infection is treated - Goal is to get him to sit in chair for 3-4 hrs for HD treatment  2. Anasarca, with HTN. Echocardiogram with normal systolic function 8/18. No visualization of hepatic cirrhosis on CT abd/pelvis -  D/c clonidine to allow for higher BP (to help with volume removal with HD) - irbesartan at bedtime  3. Secondary Hyperparathyroidism with hyperphosphatemia.   PTH 60  -   calcium acetate with meals.   4. Anemia of chronic kidney disease: hemoglobin 8.3 -  Oral iron  5. Generalized weakness with muscle wasting Neurology evaluation in progress - hold statin   LOS: 7 [name] 8/24/20181:22 PM

## 2017-04-07 LAB — BLOOD GAS, ARTERIAL
ACID-BASE DEFICIT: 2.8 mmol/L — AB (ref 0.0–2.0)
ACID-BASE DEFICIT: 6.5 mmol/L — AB (ref 0.0–2.0)
Acid-base deficit: 3.1 mmol/L — ABNORMAL HIGH (ref 0.0–2.0)
BICARBONATE: 26.4 mmol/L (ref 20.0–28.0)
Bicarbonate: 25.5 mmol/L (ref 20.0–28.0)
Bicarbonate: 26.4 mmol/L (ref 20.0–28.0)
DELIVERY SYSTEMS: POSITIVE
DELIVERY SYSTEMS: POSITIVE
DELIVERY SYSTEMS: POSITIVE
Expiratory PAP: 5
Expiratory PAP: 5
Expiratory PAP: 5
FIO2: 0.5
FIO2: 0.5
FIO2: 0.5
INSPIRATORY PAP: 17
Inspiratory PAP: 26
MODE: POSITIVE
MODE: POSITIVE
MODE: POSITIVE
Mechanical Rate: 24
Mechanical Rate: 24
Mechanical Rate: 8
O2 SAT: 98.5 %
O2 SAT: 98.3 %
O2 Saturation: 97.2 %
PATIENT TEMPERATURE: 37
PCO2 ART: 74 mmHg — AB (ref 32.0–48.0)
PCO2 ART: 74 mmHg — AB (ref 32.0–48.0)
PH ART: 7.16 — AB (ref 7.350–7.450)
PO2 ART: 136 mmHg — AB (ref 83.0–108.0)
Patient temperature: 37
Patient temperature: 37
pCO2 arterial: 84 mmHg (ref 32.0–48.0)
pH, Arterial: 7.09 — CL (ref 7.350–7.450)
pH, Arterial: 7.16 — CL (ref 7.350–7.450)
pO2, Arterial: 123 mmHg — ABNORMAL HIGH (ref 83.0–108.0)
pO2, Arterial: 140 mmHg — ABNORMAL HIGH (ref 83.0–108.0)

## 2017-04-07 LAB — CBC
HEMATOCRIT: 26.4 % — AB (ref 40.0–52.0)
HEMOGLOBIN: 8.3 g/dL — AB (ref 13.0–18.0)
MCH: 26.4 pg (ref 26.0–34.0)
MCHC: 31.6 g/dL — AB (ref 32.0–36.0)
MCV: 83.5 fL (ref 80.0–100.0)
Platelets: 432 10*3/uL (ref 150–440)
RBC: 3.16 MIL/uL — AB (ref 4.40–5.90)
RDW: 15.6 % — ABNORMAL HIGH (ref 11.5–14.5)
WBC: 12.3 10*3/uL — ABNORMAL HIGH (ref 3.8–10.6)

## 2017-04-07 LAB — BASIC METABOLIC PANEL
Anion gap: 12 (ref 5–15)
BUN: 87 mg/dL — AB (ref 6–20)
CHLORIDE: 103 mmol/L (ref 101–111)
CO2: 24 mmol/L (ref 22–32)
CREATININE: 6.09 mg/dL — AB (ref 0.61–1.24)
Calcium: 7.3 mg/dL — ABNORMAL LOW (ref 8.9–10.3)
GFR calc Af Amer: 13 mL/min — ABNORMAL LOW (ref >60)
GFR calc non Af Amer: 11 mL/min — ABNORMAL LOW (ref >60)
GLUCOSE: 83 mg/dL (ref 65–99)
POTASSIUM: 4.5 mmol/L (ref 3.5–5.1)
Sodium: 139 mmol/L (ref 135–145)

## 2017-04-07 MED ORDER — DEXMEDETOMIDINE HCL IN NACL 400 MCG/100ML IV SOLN
0.4000 ug/kg/h | INTRAVENOUS | Status: DC
  Administered 2017-04-07 – 2017-04-08 (×3): 0.8 ug/kg/h via INTRAVENOUS
  Filled 2017-04-07 (×9): qty 100

## 2017-04-07 NOTE — Progress Notes (Signed)
This note also relates to the following rows which could not be included: Pulse Rate - Cannot attach notes to unvalidated device data Resp - Cannot attach notes to unvalidated device data BP - Cannot attach notes to unvalidated device data  HD COMPLETED  

## 2017-04-07 NOTE — Progress Notes (Signed)
Pt continually attempting to remove BIPAP.  Moved patient closer to the nursing station.

## 2017-04-07 NOTE — Progress Notes (Signed)
[facility]  ROUNDING NOTE   Subjective:   1000 cc removed with second dialysis treatment on 8/21, 2000 cc removed on 8/23 Patient was transferred to ICU for resp distress. Found to have severe resp acidosis and large Pleural effusions - placed on BiPAP - dialysis this morning for volume removal   Objective:  Vital signs in last 24 hours:  Temp:  [93.8 F (34.3 C)-98.3 F (36.8 C)] 98.3 F (36.8 C) (08/25 0930) Pulse Rate:  [60-90] 64 (08/25 1215) Resp:  [12-22] 12 (08/25 1215) BP: (98-176)/(70-115) 118/82 (08/25 1215) SpO2:  [96 %-100 %] 100 % (08/25 0600) FiO2 (%):  [50 %] 50 % (08/25 0600) Weight:  [147 kg (324 lb 1.2 oz)-154.5 kg (340 lb 9.8 oz)] 154.5 kg (340 lb 9.8 oz) (08/25 0930)  Weight change: 1.28 kg (2 lb 13.1 oz) Filed Weights   04/06/17 2243 04/07/17 0800 04/07/17 0930  Weight: (!) 148.8 kg (328 lb) (!) 147 kg (324 lb 1.2 oz) (!) 154.5 kg (340 lb 9.8 oz)    Intake/Output: I/O last 3 completed shifts: In: 39 [IV Piggyback:50] Out: 0    Intake/Output this shift:  No intake/output data recorded.  Physical Exam: General: Critically ill appearing, laying in bed  Head: +temporal wasting, BiPAP mask in place  Eyes: Anicteric,    Neck: obese  Lungs:  Diminished bilaterally  Heart: tachycardic  Abdomen:  Soft, nontender, obese  Extremities: 2-3+ dependent peripheral edema, left thigh with dressings: +induration  Neurologic: significant generalized muscle wasting, able to follow simple commands  Skin: Prurigo lesions over legs and thigs  Access: Rt IJ temp cath    Basic Metabolic Panel:  Recent Labs Lab 04/01/17 0332 04/02/17 0418 04/03/17 0351 04/05/17 0348 04/06/17 0402 04/07/17 0504  NA 141 142 142 142 143 139  K 4.6 4.7 4.4 4.3 4.4 4.5  CL 107 108 107 106 106 103  CO2 24 25 26 25 26 24   GLUCOSE 92 100* 108* 84 91 83  BUN 102* 108* 97* 96* 81* 87*  CREATININE 6.60* 6.81* 6.38* 6.28* 5.80* 6.09*  CALCIUM 7.2* 7.0* 6.7* 7.1* 7.4*  7.3*  PHOS 9.1*  --   --   --   --   --     Liver Function Tests:  Recent Labs Lab 04/01/17 0332  AST 11*  ALT 13*  ALKPHOS 70  BILITOT 0.5  PROT 6.7  ALBUMIN 1.6*   No results for input(s): LIPASE, AMYLASE in the last 168 hours. No results for input(s): AMMONIA in the last 168 hours.  CBC:  Recent Labs Lab 04/01/17 0332 04/03/17 0351 04/06/17 0402 04/07/17 0504  WBC 13.0* 12.5* 14.3* 12.3*  HGB 8.9* 8.3* 8.6* 8.3*  HCT 28.2* 26.0* 27.7* 26.4*  MCV 83.1 81.8 81.6 83.5  PLT 501* 433 484* 432    Cardiac Enzymes:  Recent Labs Lab 04/05/17 2334  CKMB 6.8*    BNP: Invalid input(s): POCBNP  CBG:  Recent Labs Lab 04/03/17 0728 04/03/17 1112 04/03/17 1628 04/06/17 2201 04/06/17 2246  GLUCAP 83 91 93 98 95    Microbiology: Results for orders placed or performed during the hospital encounter of 03/30/17  Aerobic/Anaerobic Culture (surgical/deep wound)     Status: Abnormal   Collection Time: 03/30/17  7:56 PM  Result Value Ref Range Status   Specimen Description WOUND LEFT THIGH  Final   Special Requests NONE  Final   Gram Stain   Final    ABUNDANT WBC PRESENT, PREDOMINANTLY PMN ABUNDANT GRAM POSITIVE  COCCI ABUNDANT GRAM NEGATIVE RODS MODERATE GRAM POSITIVE RODS MODERATE GRAM NEGATIVE COCCI    Culture (A)  Final    MULTIPLE ORGANISMS PRESENT, NONE PREDOMINANT NO ANAEROBES ISOLATED Performed at [facility], [address] [place], [place] [address]    Report Status 04/05/2017 FINAL  Final  MRSA PCR Screening     Status: Abnormal   Collection Time: 03/30/17  8:58 PM  Result Value Ref Range Status   MRSA by PCR POSITIVE (A) NEGATIVE Final    Comment:        The GeneXpert MRSA Assay (FDA approved for NASAL specimens only), is one component of a comprehensive MRSA colonization surveillance program. It is not intended to diagnose MRSA infection nor to guide or monitor treatment for MRSA infections. RESULT CALLED TO, READ BACK BY AND  VERIFIED WITH: KARA CAMPBELL AT 2230 03/30/17.PMH   Body fluid culture     Status: None   Collection Time: 04/02/17 11:50 AM  Result Value Ref Range Status   Specimen Description PLEURAL  Final   Special Requests NONE  Final   Gram Stain NO WBC SEEN NO ORGANISMS SEEN   Final   Culture   Final    NO GROWTH 3 DAYS Performed at [facility], [address], [place], [place] [address]    Report Status 04/05/2017 FINAL  Final  Acid Fast Smear (AFB)     Status: None   Collection Time: 04/02/17 11:50 AM  Result Value Ref Range Status   AFB Specimen Processing Concentration  Final   Acid Fast Smear Negative  Final    Comment: (NOTE) Performed At: [facility] [address] [place], [place] [id number] Mila Homer MD [id number]    Source (AFB) PLEURAL  Final    Coagulation Studies: No results for input(s): LABPROT, INR in the last 72 hours.  Urinalysis: No results for input(s): COLORURINE, LABSPEC, PHURINE, GLUCOSEU, HGBUR, BILIRUBINUR, KETONESUR, PROTEINUR, UROBILINOGEN, NITRITE, LEUKOCYTESUR in the last 72 hours.  Invalid input(s): APPERANCEUR    Imaging: Dg Chest Port 1 View  Result Date: 04/06/2017 CLINICAL DATA:  Cough EXAM: PORTABLE CHEST 1 VIEW COMPARISON:  04/04/2017 FINDINGS: Moderate to large left pleural effusion with apical capping, increased. Small to moderate right pleural effusion, grossly unchanged. Cardiomegaly. Right IJ venous catheter terminates in the right atrium. Mild interstitial edema is suspected. No pneumothorax. IMPRESSION: Cardiomegaly with mild interstitial edema. Bilateral pleural effusions, moderate to large on the left, increased. Electronically Signed   By: Charline Bills M.D.   On: 04/06/2017 23:17     Medications:   . albumin human    . ceFEPime (MAXIPIME) IV Stopped (04/07/17 0140)  . dexmedetomidine (PRECEDEX) IV infusion 0.8 mcg/kg/hr (04/07/17 0802)  . vancomycin Stopped (04/07/17 1223)   . bisoprolol  10 mg  Oral Daily  . buPROPion  100 mg Oral BID  . calcium acetate  667 mg Oral TID WC  . cloNIDine  0.2 mg Oral BID  . collagenase   Topical Daily  . docusate sodium  100 mg Oral BID  . feeding supplement (NEPRO CARB STEADY)  237 mL Oral TID BM  . ferrous sulfate  325 mg Oral Q1200  . irbesartan  300 mg Oral QHS  . levothyroxine  75 mcg Oral QAC breakfast  . multivitamin  1 tablet Oral QHS   acetaminophen **OR** [DISCONTINUED] acetaminophen, hydrALAZINE, ipratropium-albuterol, LORazepam, [DISCONTINUED] ondansetron **OR** ondansetron (ZOFRAN) IV, traMADol, vancomycin  Assessment/ Plan:  [name] is a 36 y.o.  white male with hypertension, morbid obesity, pulmonary hypertension, hypothyroidism, hyperlipidemia, depression/anxiety, coronary artery disease, congestive heart failure , who was admitted to [facility] on 03/30/2017  1. Acute renal failure on chronic kidney disease stage III with nephrotic range proteinuria, hematuria and glucosuria No baseline creatinine available. Creatinine of 1.3 in 12/2014 (care everywhere).  Concern for progression of chronic kidney disease to CKD stage V.  Patient has Nephrotic syndrome of unknown etiology. He states he has been dealing with edema for over 2-3 yeas but did not seek medical care. Renal ultrasound report reviewed. HIV negative Not a candidate for kidney biopsy due to body habitus/ in ability to lie prone/ current infection   - IV albumin with HD - 1st HD on 8/20; PPD has been placed on 8/20 - will probably continue to need chronic dialysis - will look into permacath placement early next week once stable for conscious sedation and infection is treated - Goal is to get him to sit in chair for 3-4 hrs for HD treatment  2. Anasarca, with HTN. Echocardiogram with normal systolic function 8/18. No visualization of hepatic cirrhosis on CT abd/pelvis -  D/c clonidine to allow for higher BP (to help with volume removal with HD) - irbesartan at bedtime -  SEQ mode with HD today - AVOID hypotension. SBP goal 130-150 to allow for adequate fluid removal with HD  3. Hyperphosphatemia.   PTH 60  -   calcium acetate with meals.  - HOLD until able to take PO  4. Anemia of chronic kidney disease: hemoglobin 8.3 -  Avoid Iv iron due to current infection  5. Generalized weakness with muscle wasting Neurology evaluation in progress - hold statin  6. B/l pleural effusion -  Thoracentesis is being considered - Will re-evaluate for HD tomorrow   LOS: 8 Kandra Graven 8/25/201812:26 PM

## 2017-04-07 NOTE — Progress Notes (Signed)
Pt lethargic, called by nursing that patient is altered.  ABG performed and CO2 70 with pH 7.15.  Patient is on CPAP at home and has not been here.  He is ESRD on Dialysis.  BiPAP ordered and patient transferred to stepdown unit.  On call intensivist notified.  Kristeen Miss [facility] 04/07/2017, 1:21 AM

## 2017-04-07 NOTE — Consult Note (Signed)
Name: [name] MRN: [id number] DOB: 23-Apr-1981    ADMISSION DATE:  03/30/2017  CONSULTATION DATE:  8/24  REFERRING MD :  Dr. Anne Hahn  CHIEF COMPLAINT:  Shortness of breath  BRIEF PATIENT DESCRIPTION: 36 year old male transferred from the floor due Acute on Chronic respiratory failure. Secondary to increased left sided pleural effussion   SIGNIFICANT EVENTS  8/25>> patient transferred to stepdown unit for increased shortness of breath related to increased left-sided pleural effusion  STUDIES:  8/18 ECHO>>The cavity size was normal. There was mild  concentric hypertrophy. Systolic function was normal. The  estimated ejection fraction was in the range of 50% to 55%.   HISTORY OF PRESENT ILLNESS:  [name] is a 36 year old male with past medical history significant for depression, diastolic heart failure, hypertension, hypothyroidism, obesity, stage III chronic kidney disease, pulmonary hypertension and obesity. Patient presented to [facility] on 8/17 with large left-sided pleural effusion. He also had large necrotic pressure ulcer on his left thigh requiring sharp excisional debridement by vascular, which was done on 8/18. Patient had thoracentesis on 8/20 with removal of 2.8 L of pleural fluid. Postthoracentesis patient developed pneumothorax and was on BiPAP. Patient was moved to MedSurg floor on 8/21. On 8/25 patient was moved back to the SDU with shortness of breath. PH-7.15, CO2-70. Patient was placed on BiPAP. CXR on 825 is concerning for bilateral pleural effusion with moderate to let large left-sided pleural effusion.  PAST MEDICAL HISTORY :   has a past medical history of CKD (chronic kidney disease); Depression; Diastolic CHF (HCC); Generalized anxiety disorder; Hyperlipidemia; Hypertension; Hypertension; Hypothyroidism; Lymphedema; Obesity; and Pulmonary hypertension (HCC).  has a past surgical history that includes Cardiac catheterization and  DIALYSIS/PERMA CATHETER INSERTION (N/A, 04/02/2017). Prior to Admission medications   Medication Sig Start Date End Date Taking? Authorizing Provider  amLODipine (NORVASC) 10 MG tablet Take 10 mg by mouth daily.   Yes [provider]  amLODipine (NORVASC) 5 MG tablet Take 5 mg by mouth daily.   Yes [provider]  apixaban (ELIQUIS) 5 MG TABS tablet Take 5 mg by mouth 2 (two) times daily.   Yes [provider]  aspirin 325 MG EC tablet Take 325 mg by mouth daily.   Yes [provider]  atorvastatin (LIPITOR) 20 MG tablet Take 20 mg by mouth at bedtime.   Yes [provider]  bisoprolol (ZEBETA) 10 MG tablet Take 10 mg by mouth daily.   Yes [provider]  buPROPion (WELLBUTRIN) 100 MG tablet Take 100 mg by mouth 2 (two) times daily.   Yes [provider]  cloNIDine (CATAPRES) 0.2 MG tablet Take 0.2 mg by mouth 2 (two) times daily.   Yes [provider]  collagenase (SANTYL) ointment Apply 1 application topically daily.   Yes [provider]  hydrALAZINE (APRESOLINE) 50 MG tablet Take 50 mg by mouth 3 (three) times daily.   Yes [provider]  levothyroxine (SYNTHROID, LEVOTHROID) 75 MCG tablet Take 75 mcg by mouth daily before breakfast.   Yes [provider]  metolazone (ZAROXOLYN) 2.5 MG tablet Take 2.5 mg by mouth every other day.   Yes [provider]  nystatin (MYCOSTATIN/NYSTOP) powder Apply topically 2 (two) times daily.   Yes [provider]  potassium chloride SA (K-DUR,KLOR-CON) 20 MEQ tablet Take 20 mEq by mouth every other day.   Yes [provider]  ondansetron (ZOFRAN) 8 MG tablet Take by mouth every 8 (eight) hours as  needed for nausea or vomiting.    [provider]  traMADol (ULTRAM) 50 MG tablet Take 50 mg by mouth every 8 (eight) hours as needed.    [provider]   No Known Allergies  FAMILY HISTORY:  family history includes  Hypertension in his mother. SOCIAL HISTORY:  reports that he is a non-smoker but has been exposed to tobacco smoke. He has never used smokeless tobacco. He reports that he does not drink alcohol.  REVIEW OF SYSTEMS:   Constitutional: Negative for fever, chills, weight loss, malaise/fatigue and diaphoresis.  HENT: Negative for hearing loss, ear pain, nosebleeds, congestion, sore throat, neck pain, tinnitus and ear discharge.   Eyes: Negative for blurred vision, double vision, photophobia, pain, discharge and redness.  Respiratory: Negative for cough, hemoptysis, sputum production, shortness of breath, wheezing and stridor.   Cardiovascular: Negative for chest pain, palpitations, orthopnea, claudication, leg swelling and PND.  Gastrointestinal: Negative for heartburn, nausea, vomiting, abdominal pain, diarrhea, constipation, blood in stool and melena.  Genitourinary: Negative for dysuria, urgency, frequency, hematuria and flank pain.  Musculoskeletal: Negative for myalgias, back pain, joint pain and falls.  Skin: Negative for itching and rash.  Neurological: Negative for dizziness, tingling, tremors, sensory change, speech change, focal weakness, seizures, loss of consciousness, weakness and headaches.  Endo/Heme/Allergies: Negative for environmental allergies and polydipsia. Does not bruise/bleed easily.  SUBJECTIVE: Patient is drowsy but able to answer questions appropriately  VITAL SIGNS: Temp:  [93.8 F (34.3 C)-97.8 F (36.6 C)] 93.8 F (34.3 C) (08/25 0000) Pulse Rate:  [69-77] 72 (08/25 0000) Resp:  [19-22] 22 (08/25 0000) BP: (157-176)/(92-115) 157/105 (08/25 0000) SpO2:  [97 %-100 %] 100 % (08/25 0000) FiO2 (%):  [50 %] 50 % (08/24 2300) Weight:  [148.8 kg (328 lb)] 148.8 kg (328 lb) (08/24 2243)  PHYSICAL EXAMINATION: General: Obese male, lethargic but arousable Neuro: Arouses with voice HEENT: AT,[place],No jvd Cardiovascular:  S1s2,regular, no m/r/g Lungs: diminished air  entry, no wheezes,crackles,rhonchi Abdomen: soft,obese,NT Musculoskeletal:  No edema,cyanosis Skin:  Dressing on left thigh intact, dry and intact   Recent Labs Lab 04/03/17 0351 04/05/17 0348 04/06/17 0402  NA 142 142 143  K 4.4 4.3 4.4  CL 107 106 106  CO2 26 25 26   BUN 97* 96* 81*  CREATININE 6.38* 6.28* 5.80*  GLUCOSE 108* 84 91    Recent Labs Lab 04/01/17 0332 04/03/17 0351 04/06/17 0402  HGB 8.9* 8.3* 8.6*  HCT 28.2* 26.0* 27.7*  WBC 13.0* 12.5* 14.3*  PLT 501* 433 484*   Dg Chest Port 1 View  Result Date: 04/06/2017 CLINICAL DATA:  Cough EXAM: PORTABLE CHEST 1 VIEW COMPARISON:  04/04/2017 FINDINGS: Moderate to large left pleural effusion with apical capping, increased. Small to moderate right pleural effusion, grossly unchanged. Cardiomegaly. Right IJ venous catheter terminates in the right atrium. Mild interstitial edema is suspected. No pneumothorax. IMPRESSION: Cardiomegaly with mild interstitial edema. Bilateral pleural effusions, moderate to large on the left, increased. Electronically Signed   By: Charline Bills M.D.   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 Thedore Mins, will probably continue to need chronic dialysis Continue Cefipime/vanc for infected left thigh wound, may stop antibiotics on August 25  Continue Bisoprolol,clonidine and Avapro Continue Synthroid Per Dr. Thad Ranger, may need EMG as  outpatient.    Bincy Varughese,AG-ACNP Pulmonary and Critical Care Medicine [facility]   04/07/2017, 12:06 AM

## 2017-04-07 NOTE — Progress Notes (Signed)
bipap rounded on bipap. nif not performed at this time. Patient lying flat in bed asleep in no distress. Will continue to monitor for another time when patient is alert and full awake

## 2017-04-07 NOTE — Progress Notes (Signed)
Pt refusing to wear BIPAP.  Pt on 6L [place] with EtCO2 monitoring.  Notified Bincy, NP and [place], RT.  No new orders at this time.   Blake Divine, RN

## 2017-04-07 NOTE — Progress Notes (Signed)
eLink Physician-Brief Progress Note Patient Name: [name] DOB: 1981-05-23 MRN: [id number]   Date of Service  04/07/2017  HPI/Events of Note  8 M with [facility] on nightly NIV support in hosp for dialysis catheter placement.  Transferred to SDU for HCRF with pH 7.15/70/145.  Placed on BiPAP with repeat ABG showing 7.09/84/123 on min BiPAP settings of rr of 8 and IPAP of 16.  No major mask leak despite beard.  eICU Interventions  Plan: Increase IPAP settings along with RR Recheck ABG>>if not improvement then intubate      Intervention Category Evaluation Type: New Patient Evaluation  DETERDING,ELIZABETH 04/07/2017, 1:21 AM

## 2017-04-07 NOTE — Significant Event (Signed)
Rapid Response Event Note  Overview: Time Called: 2245 Event Type: Neurologic, Respiratory  Initial Focused Assessment: Pt lethargic, hypoventilating.  CO2 on abg 70   Interventions: Transfer to ICU  Plan of Care (if not transferred):  Event Summary:   at      at          [facility]

## 2017-04-07 NOTE — Progress Notes (Signed)
Bilateral mitts applied to patient.  He has repeatedly removed them.  Explained to patient purpose of mitts and the importance of BIPAP to prevent intubation.  Continue to monitor.   Blake Divine, RN

## 2017-04-07 NOTE — Progress Notes (Signed)
Pt woke confused and removed monitors.  Pt easy to redirect.   Explained current situation with no questions.   Continue to monitor.  Blake Divine, RN

## 2017-04-07 NOTE — Progress Notes (Signed)
[employer] [facility] [place] [facility]   PATIENT NAME: [name]    MR#:  [id number]  DATE OF BIRTH:  07-03-1981  SUBJECTIVE:     The patient is found drawsy and hypercapnia last night, put on BIPAPand transferred to stepdown unit. On HD.  REVIEW OF SYSTEMS:    Review of Systems  Lethargic and drowsy, unable to get ROS. DRUG ALLERGIES:  No Known Allergies  VITALS:  Blood pressure 108/75, pulse 65, temperature (!) 97.4 F (36.3 C), temperature source Axillary, resp. rate 12, height 5\' 6"  (1.676 m), weight (!) 334 lb (151.5 kg), SpO2 100 %.  PHYSICAL EXAMINATION:  Constitutional: Appears obese anasarca. NAD. Morbid obesity. onBIPAP. HENT: Normocephalic. [name] Oropharynx is clear and moist.  Eyes: Conjunctivae and EOM are normal. PERRLA, no scleral icterus.  Neck: Normal ROM. Neck supple. No JVD. No tracheal deviation. CVS: RRR, S1/S2 +, no murmurs, no gallops, no carotid bruit.  Pulmonary: Decreased breath sounds with scant bilateral wheezing at bases Abdominal: abdominal wall edema  Due to body habitus unable to appreciate for organomegaly BS + no rebound guarding Musculoskeletal: Normal range of motion. +anasarca no tenderness.   Neuro: unable to exam. Skin:left thigh covered wound   Right femoral dialysis catheter placed Psychiatric:Drowsy and lethargic. LABORATORY PANEL:   CBC  Recent Labs Lab 04/07/17 0504  WBC 12.3*  HGB 8.3*  HCT 26.4*  PLT 432   ------------------------------------------------------------------------------------------------------------------  Chemistries   Recent Labs Lab 04/01/17 0332  04/07/17 0504  NA 141  < > 139  K 4.6  < > 4.5  CL 107  < > 103  CO2 24  < > 24  GLUCOSE 92  < > 83  BUN 102*  < > 87*  CREATININE 6.60*  < > 6.09*  CALCIUM 7.2*  < > 7.3*  AST 11*  --   --   ALT 13*  --   --   ALKPHOS 70  --   --   BILITOT 0.5  --   --   < > = values in this interval not  displayed. ------------------------------------------------------------------------------------------------------------------  Cardiac Enzymes No results for input(s): TROPONINI in the last 168 hours. ------------------------------------------------------------------------------------------------------------------  RADIOLOGY:  Dg Chest Port 1 View  Result Date: 04/06/2017 CLINICAL DATA:  Cough EXAM: PORTABLE CHEST 1 VIEW COMPARISON:  04/04/2017 FINDINGS: Moderate to large left pleural effusion with apical capping, increased. Small to moderate right pleural effusion, grossly unchanged. Cardiomegaly. Right IJ venous catheter terminates in the right atrium. Mild interstitial edema is suspected. No pneumothorax. IMPRESSION: Cardiomegaly with mild interstitial edema. Bilateral pleural effusions, moderate to large on the left, increased. Electronically Signed   By: Charline Bills M.D.   On: 04/06/2017 23:17     ASSESSMENT AND PLAN:   36 year old male with history of chronic diastolic heart failure, primary pulmonary hypertension and hypothyroidism with chronic kidney disease stage III who presents for [facility] due to shortness of breath.   1. Acute on chronic hypoxic respiratory failure with hypoxia in the setting of fluid overload as well as small pneumothorax After thoracentesis Patient titrated off of BiPAP and on nasal cannula 4L. Pneumothorax has resolved  Pulmonologist recommended an chest x-ray in one week which would be next Wednesday. He is put on BiPAP again due to hypercapnia. NEB.  * Bilateral moderate to largepleural effusion. Thoracentesis by IR.  2 Acute on chronic kidney failure Stage III:  progression of chronic kidney disease to chronic kidney disease stage V Nephrology consult appreciated. Renal  ultrasound showed no hydronephrosis Patient Has started dialysis which was initiated August 20. Not a candidate for kidney biopsy due to body habitus HD today, IV  albumin with HD. Per Dr. Thedore Mins, will probably continue to need chronic dialysis.  3.  acute on chronic diastolic heart failure and pulmonary hypertension and anasarca Continue hemodialysis  Echocardiogram shows normal ejection fraction without major valvular abnormalities. Continue HD. S/p U/s guideded paracentesis status post 2.8 L of fluid on August 20.   4. Left thigh wound:  Status post bedside debridement day # 7 by surgeon. MULTIPLE ORGANISMS PRESENT, NONE PREDOMINANT. NO ANAEROBES ISOLATED per wound cultures.  On vancomycin and cefepime and stop antibiotics on August 25. Pressure offloading Daily dressing changes with Santyl.  4. Accelerated Essential hypertension: Continue irbesartan and Bisoprolol, resumed Clonidine due to accelerated hypertension. Hold it due to low side BP today. SBP goal 130-150 to allow for adequate fluid removal with HD per Dr. Thedore Mins.  5. Hypothyroid: Continue Synthroid 6. Hyperlipidemia: Continue statin 7. 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 Hemoglobin is stable.  Generalized weakness with muscle wasting Hold statin. Per Dr. Thad Ranger, may need EMG as outpatient.   Discussed with Dr. Thedore Mins. Physical therapy is recommending the patient return back to skilled nursing facility once stable for discharge Management plans discussed with the patient and he is in agreement.  CODE STATUS: full  TOTAL TIME TAKING CARE OF THIS PATIENT: 37 minutes.   POSSIBLE D/C 3-4  days, DEPENDING ON CLINICAL CONDITION.   Shaune Pollack M.D on 04/07/2017 at 3:34 PM  Between 7am to 6pm - Pager - [phone] After 6pm go to [email or web address] - password [email or web address]  [facility] [place] [facility]  Office  [phone]  CC: Primary care physician; Patient, No Pcp Per  Note: This dictation was prepared with Dragon dictation along with smaller phrase  technology. Any transcriptional errors that result from this process are unintentional.

## 2017-04-07 NOTE — Progress Notes (Signed)
Critical ABG result obtained.  ABG results were verified and read to Bincy, NP.

## 2017-04-07 NOTE — Progress Notes (Signed)
HD STARTED  

## 2017-04-07 NOTE — Plan of Care (Signed)
Problem: Pain Managment: Goal: General experience of comfort will improve Outcome: Progressing Pt denies pain  Problem: Activity: Goal: Risk for activity intolerance will decrease Outcome: Not Progressing Pt with respiratory distress on BIPAP  Problem: Fluid Volume: Goal: Ability to maintain a balanced intake and output will improve Outcome: Progressing Pt schedule for HD today  Problem: Nutrition: Goal: Adequate nutrition will be maintained Outcome: Not Progressing Pt not tolerating PO intake  Problem: Fluid Volume: Goal: Fluid volume balance will be maintained or improved Outcome: Progressing Pt schedule for HD today  Problem: Respiratory: Goal: Respiratory symptoms related to disease process will be avoided Outcome: Not Progressing Pt deteriorated to BIPAP and transferred to ICU.  Remains acidotic

## 2017-04-07 NOTE — Progress Notes (Signed)
In report at shift change Pt was found to be sleepy and (out of it) believed that this may have been caused by an Ultram given for pain earlier. BP taken after shift start was in the 180's/one teens and it was  noted that the Pt's Bp would usually increase after he has been moving in the bed and Pt had just  been on the bedpan. Bp taken on recheck found no change  Md was notified that Pt BP was elevated and that PT seemed lethargic with altered level of consciousness, Pt also noted that he was SOB  with SATS in the upper 90's on 4L It was also drawn to MD's attention that Pt had had an Ultram earlier and that he uses BIPAP at home but he was not on it here but on 4 LO2. MD ordered stat  ABG's be drawn and BIPAP for HS. O2 was decreased to 2L with SATS still in the 90's. After blood was drawn Rapid Response was called as Pt was having problems breathing. Result from gas showed Pt CO2 elevated and RT noted that she was on the way with the BIPAP. RR team was in the room at the time result was given and the decision was made to transfer the Pt to ICU.

## 2017-04-08 ENCOUNTER — Inpatient Hospital Stay: Payer: Medicaid Other

## 2017-04-08 LAB — BLOOD GAS, ARTERIAL
Acid-Base Excess: 2 mmol/L (ref 0.0–2.0)
BICARBONATE: 27.7 mmol/L (ref 20.0–28.0)
DELIVERY SYSTEMS: POSITIVE
EXPIRATORY PAP: 5
FIO2: 0.3
Inspiratory PAP: 25
O2 Saturation: 97.3 %
PATIENT TEMPERATURE: 37
PH ART: 7.37 (ref 7.350–7.450)
RATE: 12 resp/min
pCO2 arterial: 48 mmHg (ref 32.0–48.0)
pO2, Arterial: 97 mmHg (ref 83.0–108.0)

## 2017-04-08 MED ORDER — ALBUMIN HUMAN 25 % IV SOLN
12.5000 g | Freq: Once | INTRAVENOUS | Status: AC
  Administered 2017-04-08: 12.5 g via INTRAVENOUS
  Filled 2017-04-08: qty 50

## 2017-04-08 MED ORDER — MORPHINE SULFATE (PF) 2 MG/ML IV SOLN
1.0000 mg | INTRAVENOUS | Status: AC
  Administered 2017-04-08: 1 mg via INTRAVENOUS
  Filled 2017-04-08: qty 1

## 2017-04-08 NOTE — Progress Notes (Signed)
Patient able to perform -40 NIF this evening. Patient not able to perform FVC. He states he doesn't feel as if he can take a deep breath in in at this time. He states he feels a little short of breath but not distressed.

## 2017-04-08 NOTE — Progress Notes (Signed)
HD STARTED  

## 2017-04-08 NOTE — Progress Notes (Signed)
Hd completed 

## 2017-04-08 NOTE — Progress Notes (Signed)
Pt taken off bipap per Dr Lonn [place], pt in no respiratory distress, alert, placed 2lpm [place], sats 99%, respiratory rate 20/min, RN notified  Results for QUASIR, CAPEHART (MRN [id number]) as of 04/08/2017 09:00  Ref. Range 04/08/2017 08:37  Sample type Unknown ARTERIAL DRAW  Delivery systems Unknown BILEVEL POSITIVE .Marland KitchenMarland Kitchen  FIO2 Unknown 0.30  Inspiratory PAP Unknown 25  Expiratory PAP Unknown 5  pH, Arterial Latest Ref Range: 7.350 - 7.450  7.37  pCO2 arterial Latest Ref Range: 32.0 - 48.0 mmHg 48  pO2, Arterial Latest Ref Range: 83.0 - 108.0 mmHg 97  Acid-Base Excess Latest Ref Range: 0.0 - 2.0 mmol/L 2.0  Bicarbonate Latest Ref Range: 20.0 - 28.0 mmol/L 27.7  O2 Saturation Latest Units: % 97.3  Patient temperature Unknown 37.0  Collection site Unknown RIGHT RADIAL  Allens test (pass/fail) Latest Ref Range: PASS  PASS

## 2017-04-08 NOTE — Progress Notes (Signed)
PRE DIALYSIS ASSESSMENT 

## 2017-04-08 NOTE — Progress Notes (Signed)
Post dialysis assessment 

## 2017-04-08 NOTE — Progress Notes (Addendum)
Negative Inspiratory Force Maneuver done with great effort  Results -40 cm/H2O   Forced Vital Capacity 4.65L

## 2017-04-08 NOTE — Progress Notes (Signed)
Patient alert and able to make needs known. Patient was adamant about not being turned or repositioned for his care when we performed his bath this night. We obtained an order for morphine from the NP and he at that point, took the dose of 1mg  of morphine, but refused to be turned or rolled for [place] to wash his back and to check his skin integrity and make sure everything was clean and smooth.  Patient thought the bed would handle him not moving and this RN educated him that the bed is an assistive device but does not replace actually rolling and repositioning and our need to check for skin integrity. Patient continued to refused and we let him know we will give him a break but will be asking again shortly since he did do well with everything else. He let the NT shave the rest of his beard and he allowed [place] to swab his mouth and I performed wound care. He is resting at present time and reminded him that we will ask him to try again later.

## 2017-04-08 NOTE — Progress Notes (Signed)
[employer] [facility] [place] [facility]   PATIENT NAME: [name]    MR#:  [id number]  DATE OF BIRTH:  09-13-1980  SUBJECTIVE:     The patient alert and awake, off BIPAP, on O2 [place] 2L. Weakness.  REVIEW OF SYSTEMS:    Review of Systems  Constitutional: Negative for fever, chills weight loss  Positive generalized weakness  HENT: Negative for ear pain, nosebleeds, congestion, facial swelling, rhinorrhea, neck pain, neck stiffness and ear discharge.   Respiratory: Negative for cough, mild shortness of breath, no wheezing  Cardiovascular: Negative for chest pain, palpitations and positive leg swelling.  Gastrointestinal: Negative for heartburn, abdominal pain, vomiting, diarrhea or consitpation Genitourinary: Negative for dysuria, urgency, frequency, hematuria Musculoskeletal: Negative for back pain or joint pain Neurological: Negative for dizziness, seizures, syncope, focal weakness,  numbness and headaches.  Hematological: Does not bruise/bleed easily.  Psychiatric/Behavioral: Negative for hallucinations, confusion, dysphoric mood Skin: no rash. DRUG ALLERGIES:  No Known Allergies  VITALS:  Blood pressure (!) 154/93, pulse (!) 59, temperature 98.1 F (36.7 C), temperature source Axillary, resp. rate 13, height 5\' 6"  (1.676 m), weight (!) 334 lb (151.5 kg), SpO2 100 %.  PHYSICAL EXAMINATION:  Constitutional: Appears obese anasarca. NAD. Morbid obesity. onBIPAP. HENT: Normocephalic. [name] Oropharynx is clear and moist.  Eyes: Conjunctivae and EOM are normal. PERRLA, no scleral icterus.  Neck: Normal ROM. Neck supple. No JVD. No tracheal deviation. CVS: RRR, S1/S2 +, no murmurs, no gallops, no carotid bruit.  Pulmonary: Decreased breath sounds with scant bilateral wheezing at bases Abdominal: abdominal wall edema  Due to body habitus unable to appreciate for organomegaly BS + no rebound guarding Musculoskeletal: Normal range of motion. +anasarca no tenderness.    Neuro: Alert. CN 2-12 grossly intact. No focal deficits. Power 0-1/5 in lower extremities and 3-4/5 in upper extremities. Skin:left thigh covered wound   Right femoral dialysis catheter placed Psychiatric:alert and awake. LABORATORY PANEL:   CBC  Recent Labs Lab 04/07/17 0504  WBC 12.3*  HGB 8.3*  HCT 26.4*  PLT 432   ------------------------------------------------------------------------------------------------------------------  Chemistries   Recent Labs Lab 04/07/17 0504  NA 139  K 4.5  CL 103  CO2 24  GLUCOSE 83  BUN 87*  CREATININE 6.09*  CALCIUM 7.3*   ------------------------------------------------------------------------------------------------------------------  Cardiac Enzymes No results for input(s): TROPONINI in the last 168 hours. ------------------------------------------------------------------------------------------------------------------  RADIOLOGY:  Dg Chest Port 1 View  Result Date: 04/06/2017 CLINICAL DATA:  Cough EXAM: PORTABLE CHEST 1 VIEW COMPARISON:  04/04/2017 FINDINGS: Moderate to large left pleural effusion with apical capping, increased. Small to moderate right pleural effusion, grossly unchanged. Cardiomegaly. Right IJ venous catheter terminates in the right atrium. Mild interstitial edema is suspected. No pneumothorax. IMPRESSION: Cardiomegaly with mild interstitial edema. Bilateral pleural effusions, moderate to large on the left, increased. Electronically Signed   By: Charline Bills M.D.   On: 04/06/2017 23:17     ASSESSMENT AND PLAN:   36 year old male with history of chronic diastolic heart failure, primary pulmonary hypertension and hypothyroidism with chronic kidney disease stage III who presents for [facility] due to shortness of breath.   1. Acute on chronic hypoxic respiratory failure with hypoxia in the setting of fluid overload as well as small pneumothorax After thoracentesis Patient titrated off of BiPAP  and on nasal cannula 4L. Pneumothorax has resolved  Pulmonologist recommended an chest x-ray in one week which would be next Wednesday. He was put on BiPAP again due to hypercapnia. NEB. Off BIPAP. On  O2 [place] 2L. Continue NEB.  Obesity Hypoventilation Syndrome. BIPAP prn.  * Bilateral moderate to large pleural effusion. Repeat CXR in am. Thoracentesis by IR per Dr. Lonn Georgia.  2 Acute on chronic kidney failure Stage III:  progression of chronic kidney disease to chronic kidney disease stage V Nephrology consult appreciated. Renal ultrasound showed no hydronephrosis Patient Has started dialysis which was initiated August 20. Not a candidate for kidney biopsy due to body habitus HD today, IV albumin with HD. Per Dr. Thedore Mins, will probably continue to need chronic dialysis.  3.  acute on chronic diastolic heart failure and pulmonary hypertension and anasarca Continue hemodialysis  Echocardiogram shows normal ejection fraction without major valvular abnormalities. Continue HD today per Dr Thedore Mins. S/p U/s guideded paracentesis status post 2.8 L of fluid on August 20.   4. Left thigh wound:  Status post bedside debridement day # 7 by surgeon. MULTIPLE ORGANISMS PRESENT, NONE PREDOMINANT. NO ANAEROBES ISOLATED per wound cultures.  On vancomycin and cefepime and stop antibiotics on August 25. Pressure offloading Daily dressing changes with Santyl.  4. Accelerated Essential hypertension: Continue irbesartan and Bisoprolol, resumed Clonidine due to accelerated hypertension. Hold it due to low side BP. SBP goal 130-150 to allow for adequate fluid removal with HD per Dr. Thedore Mins.  5. Hypothyroid: Continue Synthroid 6. Hyperlipidemia: Continue statin 7. 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 Hemoglobin is stable.  Generalized weakness with muscle wasting Hold statin. Per Dr.  Thad Ranger, may need EMG as outpatient.   Discussed with Dr. Thedore Mins and Dr. Lonn Georgia . Physical therapy is recommending the patient return back to skilled nursing facility once stable for discharge Management plans discussed with the patient and he is in agreement.  CODE STATUS: full  TOTAL TIME TAKING CARE OF THIS PATIENT: 36 minutes.   POSSIBLE D/C 3  days, DEPENDING ON CLINICAL CONDITION.   Shaune Pollack M.D on 04/08/2017 at 11:14 AM  Between 7am to 6pm - Pager - [phone] After 6pm go to [email or web address] - password [email or web address]  [facility]  Office  [phone]  CC: Primary care physician; Patient, No Pcp Per  Note: This dictation was prepared with Dragon dictation along with smaller phrase technology. Any transcriptional errors that result from this process are unintentional.

## 2017-04-08 NOTE — Progress Notes (Signed)
[facility]  ROUNDING NOTE   Subjective:   Patient was transferred to ICU for resp distress. Found to have severe resp acidosis and large Pleural effusions - placed on BiPAP Which was taken out this morning - 3000 cc of fluid was removed with hemodialysis yesterday. Patient tolerated the treatment well.   Objective:  Vital signs in last 24 hours:  Temp:  [97.4 F (36.3 C)-98.1 F (36.7 C)] 98.1 F (36.7 C) (08/26 0200) Pulse Rate:  [59-72] 59 (08/26 0600) Resp:  [12-21] 13 (08/26 0600) BP: (108-154)/(74-93) 154/93 (08/26 0600) SpO2:  [82 %-100 %] 100 % (08/26 0600) Weight:  [151.5 kg (334 lb)] 151.5 kg (334 lb) (08/25 1230)  Weight change: -1.78 kg (-3 lb 14.8 oz) Filed Weights   04/07/17 0800 04/07/17 0930 04/07/17 1230  Weight: (!) 147 kg (324 lb 1.2 oz) (!) 154.5 kg (340 lb 9.8 oz) (!) 151.5 kg (334 lb)    Intake/Output: I/O last 3 completed shifts: In: 607.8 [I.V.:557.8; IV Piggyback:50] Out: 3000 [Other:3000]   Intake/Output this shift:  No intake/output data recorded.  Physical Exam: General: Critically ill appearing, laying in bed  Head: +temporal wasting,   Eyes: Anicteric,    Neck: obese  Lungs:  Diminished bilaterally  Heart: tachycardic  Abdomen:  Soft, nontender, obese  Extremities: 2-3+ dependent peripheral edema, left thigh with dressings: +induration  Neurologic: significant generalized muscle wasting, able to follow simple commands  Skin: Prurigo lesions over legs and thighs, Few necrotic looking skin lesions over legs  Access: Rt IJ temp cath    Basic Metabolic Panel:  Recent Labs Lab 04/02/17 0418 04/03/17 0351 04/05/17 0348 04/06/17 0402 04/07/17 0504  NA 142 142 142 143 139  K 4.7 4.4 4.3 4.4 4.5  CL 108 107 106 106 103  CO2 25 26 25 26 24   GLUCOSE 100* 108* 84 91 83  BUN 108* 97* 96* 81* 87*  CREATININE 6.81* 6.38* 6.28* 5.80* 6.09*  CALCIUM 7.0* 6.7* 7.1* 7.4* 7.3*    Liver Function Tests: No results for  input(s): AST, ALT, ALKPHOS, BILITOT, PROT, ALBUMIN in the last 168 hours. No results for input(s): LIPASE, AMYLASE in the last 168 hours. No results for input(s): AMMONIA in the last 168 hours.  CBC:  Recent Labs Lab 04/03/17 0351 04/06/17 0402 04/07/17 0504  WBC 12.5* 14.3* 12.3*  HGB 8.3* 8.6* 8.3*  HCT 26.0* 27.7* 26.4*  MCV 81.8 81.6 83.5  PLT 433 484* 432    Cardiac Enzymes:  Recent Labs Lab 04/05/17 2334  CKMB 6.8*    BNP: Invalid input(s): POCBNP  CBG:  Recent Labs Lab 04/03/17 0728 04/03/17 1112 04/03/17 1628 04/06/17 2201 04/06/17 2246  GLUCAP 83 91 93 98 95    Microbiology: Results for orders placed or performed during the hospital encounter of 03/30/17  Aerobic/Anaerobic Culture (surgical/deep wound)     Status: Abnormal   Collection Time: 03/30/17  7:56 PM  Result Value Ref Range Status   Specimen Description WOUND LEFT THIGH  Final   Special Requests NONE  Final   Gram Stain   Final    ABUNDANT WBC PRESENT, PREDOMINANTLY PMN ABUNDANT GRAM POSITIVE COCCI ABUNDANT GRAM NEGATIVE RODS MODERATE GRAM POSITIVE RODS MODERATE GRAM NEGATIVE COCCI    Culture (A)  Final    MULTIPLE ORGANISMS PRESENT, NONE PREDOMINANT NO ANAEROBES ISOLATED Performed at [facility], [address], [place], [place] [address]    Report Status 04/05/2017 FINAL  Final  MRSA PCR Screening  Status: Abnormal   Collection Time: 03/30/17  8:58 PM  Result Value Ref Range Status   MRSA by PCR POSITIVE (A) NEGATIVE Final    Comment:        The GeneXpert MRSA Assay (FDA approved for NASAL specimens only), is one component of a comprehensive MRSA colonization surveillance program. It is not intended to diagnose MRSA infection nor to guide or monitor treatment for MRSA infections. RESULT CALLED TO, READ BACK BY AND VERIFIED WITH: KARA CAMPBELL AT 2230 03/30/17.PMH   Body fluid culture     Status: None   Collection Time: 04/02/17 11:50 AM  Result Value Ref  Range Status   Specimen Description PLEURAL  Final   Special Requests NONE  Final   Gram Stain NO WBC SEEN NO ORGANISMS SEEN   Final   Culture   Final    NO GROWTH 3 DAYS Performed at [facility], [address] [place], [place] [address]    Report Status 04/05/2017 FINAL  Final  Acid Fast Smear (AFB)     Status: None   Collection Time: 04/02/17 11:50 AM  Result Value Ref Range Status   AFB Specimen Processing Concentration  Final   Acid Fast Smear Negative  Final    Comment: (NOTE) Performed At: [facility] [address] [place], [place] [id number] Mila Homer MD AO:[id number]    Source (AFB) PLEURAL  Final    Coagulation Studies: No results for input(s): LABPROT, INR in the last 72 hours.  Urinalysis: No results for input(s): COLORURINE, LABSPEC, PHURINE, GLUCOSEU, HGBUR, BILIRUBINUR, KETONESUR, PROTEINUR, UROBILINOGEN, NITRITE, LEUKOCYTESUR in the last 72 hours.  Invalid input(s): APPERANCEUR    Imaging: Dg Chest Port 1 View  Result Date: 04/06/2017 CLINICAL DATA:  Cough EXAM: PORTABLE CHEST 1 VIEW COMPARISON:  04/04/2017 FINDINGS: Moderate to large left pleural effusion with apical capping, increased. Small to moderate right pleural effusion, grossly unchanged. Cardiomegaly. Right IJ venous catheter terminates in the right atrium. Mild interstitial edema is suspected. No pneumothorax. IMPRESSION: Cardiomegaly with mild interstitial edema. Bilateral pleural effusions, moderate to large on the left, increased. Electronically Signed   By: Charline Bills M.D.   On: 04/06/2017 23:17     Medications:   . dexmedetomidine (PRECEDEX) IV infusion 0.8 mcg/kg/hr (04/08/17 0350)   . bisoprolol  10 mg Oral Daily  . buPROPion  100 mg Oral BID  . collagenase   Topical Daily  . docusate sodium  100 mg Oral BID  . feeding supplement (NEPRO CARB STEADY)  237 mL Oral TID BM  . ferrous sulfate  325 mg Oral Q1200  . irbesartan  300 mg Oral QHS  .  levothyroxine  75 mcg Oral QAC breakfast  . multivitamin  1 tablet Oral QHS   acetaminophen **OR** [DISCONTINUED] acetaminophen, hydrALAZINE, ipratropium-albuterol, LORazepam, [DISCONTINUED] ondansetron **OR** ondansetron (ZOFRAN) IV, traMADol  Assessment/ Plan:  [name] is a 36 y.o. white male with hypertension, morbid obesity, pulmonary hypertension, hypothyroidism, hyperlipidemia, depression/anxiety, coronary artery disease, congestive heart failure , who was admitted to [facility] on 03/30/2017  1. Acute renal failure on chronic kidney disease stage III with nephrotic range proteinuria, hematuria and glucosuria No baseline creatinine available. Creatinine of 1.3 in 12/2014 (care everywhere).  Concern for progression of chronic kidney disease to CKD stage V.  Patient has Nephrotic syndrome of unknown etiology. He states he has been dealing with edema for over 2-3 yeas but did not seek medical care. Renal ultrasound report reviewed. HIV negative Not a  candidate for kidney biopsy due to body habitus/ in ability to lie prone/ current infection  - 1st HD on 8/20; PPD has been placed on 8/20  - IV albumin with HD - will probably continue to need chronic dialysis - will look into permacath placement early next week once stable for conscious sedation and infection is treated - Goal is to get him to sit in chair for 3-4 hrs for HD treatment but patient has not even been able to sit at all so far  2. Anasarca, with HTN. Echocardiogram with normal systolic function 8/18. No visualization of hepatic cirrhosis on CT abd/pelvis -  D/c clonidine to allow for higher BP (to help with volume removal with HD) - irbesartan at bedtime - SEQ mode with HD today - AVOID hypotension. SBP goal 130-150 to allow for adequate fluid removal with HD  3. Hyperphosphatemia.   PTH 60  -  calcium acetate with meals.  - HOLD until able to take PO  4. Anemia of chronic kidney disease: hemoglobin 8.3 -  Avoid Iv iron  due to current infection  5. Generalized weakness with muscle wasting Neurology evaluation in progress - hold statin  6. B/l pleural effusion -  Thoracentesis is being considered -   Daily evaluation for HD     LOS: 9 Comfort Iversen 8/26/201811:48 AM

## 2017-04-08 NOTE — Progress Notes (Addendum)
[facility] Critical [facility] Medicine Progess Note    SYNOPSIS   [name] is a 36 year old male with past medical history significant for depression, diastolic heart failure, hypertension, hypothyroidism, obesity, stage III chronic kidney disease, pulmonary hypertension and obesity. Patient presented to [facility] on 8/17 with large left-sided pleural effusion. He also had large necrotic pressure ulcer on his left thigh requiring sharp excisional debridement by vascular, which was done on 8/18. Patient had thoracentesis on 8/20 with removal of 2.8 L of pleural fluid. Postthoracentesis patient developed pneumothorax and was on BiPAP. Patient was moved to MedSurg floor on 8/21. On 8/25 patient was moved back to the SDUwith shortness of breath. PH-7.15, CO2-70.Patient was placed on BiPAP. CXR on 8/25 is concerning for bilateral pleural effusion with moderate to let large left-sided pleural effusion.  ASSESSMENT/PLAN   Acute on chronic hypercarbic respiratory failure. Significantly improved today. Arterial blood gas was pH 7.37, PCO2 48 and PaO2 of 97 Patient has been presently  On supplemental oxygen  Obesity Hypoventilation Syndrome  Moderate to large left sided pleural efussion. Consult has been put into interventional radiology for therapeutic thoracentesis. Will repeat check chest x-ray in the morning. Patient is clinically significantly improved  Chronic Kidney disease. On hemodialysis, was dialyzed yesterday  Hypothyroidism. Patient is on replacement therapy  Critical care time 35 minutes, acute on chronic hypercapnic resatory failure and end-stage renal disease INTAKE / OUTPUT:  Intake/Output Summary (Last 24 hours) at 04/08/17 0937 Last data filed at 04/08/17 0245  Gross per 24 hour  Intake           557.76 ml  Output             3000 ml  Net         -2442.24 ml    Name: [name] MRN: [id number] DOB: [birth year]    ADMISSION DATE:   03/30/2017  SUBJECTIVE:   Patient status post hemodialysis, on noninvasiventilation, much more awake, arterial blood gas has been significantly improved. No other difficulties noted yesterday  VITAL SIGNS: Temp:  [97.4 F (36.3 C)-98.1 F (36.7 C)] 98.1 F (36.7 C) (08/26 0200) Pulse Rate:  [59-72] 59 (08/26 0600) Resp:  [12-21] 13 (08/26 0600) BP: (98-154)/(70-93) 154/93 (08/26 0600) SpO2:  [82 %-100 %] 100 % (08/26 0600) Weight:  [151.5 kg (334 lb)] 151.5 kg (334 lb) (08/25 1230)   PHYSICAL EXAMINATION: Physical Examination:   VS: BP (!) 154/93   Pulse (!) 59   Temp 98.1 F (36.7 C) (Axillary)   Resp 13   Ht 5\' 6"  (1.676 m)   Wt (!) 151.5 kg (334 lb)   SpO2 100%   BMI 53.91 kg/m   General Appearance: No distress . Pulmonary: diminished breath sounds but clear CardiovascularNormal S1,S2.  No m/r/g.   Abdomen: Benign, Soft, non-tender. Extremities: edema noted    LABORATORY PANEL:   CBC  Recent Labs Lab 04/07/17 0504  WBC 12.3*  HGB 8.3*  HCT 26.4*  PLT 432    Chemistries   Recent Labs Lab 04/07/17 0504  NA 139  K 4.5  CL 103  CO2 24  GLUCOSE 83  BUN 87*  CREATININE 6.09*  CALCIUM 7.3*     Recent Labs Lab 04/03/17 0354 04/03/17 0728 04/03/17 1112 04/03/17 1628 04/06/17 2201 04/06/17 2246  GLUCAP 96 83 91 93 98 95    Recent Labs Lab 04/07/17 0230 04/07/17 0630 04/08/17 0837  PHART 7.16* 7.16* 7.37  PCO2ART 74* 74* 48  PO2ART 140* 136*  97   No results for input(s): AST, ALT, ALKPHOS, BILITOT, ALBUMIN in the last 168 hours.  Cardiac Enzymes No results for input(s): TROPONINI in the last 168 hours.  RADIOLOGY:  Dg Chest Port 1 View  Result Date: 04/06/2017 CLINICAL DATA:  Cough EXAM: PORTABLE CHEST 1 VIEW COMPARISON:  04/04/2017 FINDINGS: Moderate to large left pleural effusion with apical capping, increased. Small to moderate right pleural effusion, grossly unchanged. Cardiomegaly. Right IJ venous catheter terminates in the  right atrium. Mild interstitial edema is suspected. No pneumothorax. IMPRESSION: Cardiomegaly with mild interstitial edema. Bilateral pleural effusions, moderate to large on the left, increased. Electronically Signed   By: Charline Bills M.D.   On: 04/06/2017 23:17    Tora Kindred, DO 04/08/2017

## 2017-04-09 ENCOUNTER — Inpatient Hospital Stay: Payer: Medicaid Other

## 2017-04-09 DIAGNOSIS — L89223 Pressure ulcer of left hip, stage 3: Secondary | ICD-10-CM

## 2017-04-09 LAB — ACETYLCHOLINE RECEPTOR, BINDING

## 2017-04-09 LAB — STRIATED MUSCLE ANTIBODY: Anti-striation Abs: NEGATIVE

## 2017-04-09 LAB — ANTI-SMOOTH MUSCLE ANTIBODY, IGG: F-ACTIN AB IGG: 28 U — AB (ref 0–19)

## 2017-04-09 MED ORDER — MORPHINE SULFATE (PF) 2 MG/ML IV SOLN
INTRAVENOUS | Status: AC
  Administered 2017-04-09: 2 mg via INTRAVENOUS
  Filled 2017-04-09: qty 1

## 2017-04-09 MED ORDER — BLISTEX MEDICATED EX OINT
TOPICAL_OINTMENT | CUTANEOUS | Status: DC | PRN
  Filled 2017-04-09: qty 6.3

## 2017-04-09 MED ORDER — AMLODIPINE BESYLATE 5 MG PO TABS
5.0000 mg | ORAL_TABLET | Freq: Every day | ORAL | Status: DC
  Administered 2017-04-09 – 2017-04-16 (×8): 5 mg via ORAL
  Filled 2017-04-09 (×8): qty 1

## 2017-04-09 MED ORDER — MORPHINE SULFATE (PF) 2 MG/ML IV SOLN
1.0000 mg | INTRAVENOUS | Status: DC | PRN
  Administered 2017-04-09 – 2017-04-16 (×30): 2 mg via INTRAVENOUS
  Filled 2017-04-09 (×29): qty 1

## 2017-04-09 NOTE — Progress Notes (Signed)
Attempted to place patient on his Bipap at 2200.  Pt stated that he was not ready to go on yet.  I will attempt to place pt on Bipap in approx. 1 hr.

## 2017-04-09 NOTE — Progress Notes (Signed)
Patient was medicated earlier with morphine as ordered by NP. Patient stated he felt relief with the medicine, however he still insisted in staying in his current position. Patient again educated. Patient was up off and on throughout the night. Able to make needs known and when he was awake he would request for his Bi-Pap to be taken off. Patient sats well into the upper 90s whether on the Bi-Pap or 3L O2 via [place].

## 2017-04-09 NOTE — Progress Notes (Signed)
PT Cancellation Note  Patient Details Name: [name] MRN: [id number] DOB: 1981/03/15   Cancelled Treatment:    Reason Eval/Treat Not Completed: Other (comment).  Pt transferred to CCU 04/06/17 with respiratory distress (pt found to have severe respiratory acidosis and large pleural effusions).  D/t pt transferring to higher level of care, per PT protocol require new PT consult in order to continue therapy (will discontinue current PT order d/t this).  Please re-consult PT when pt is medically appropriate to participate in PT.  Hendricks Limes, PT 04/09/17, 10:24 AM [phone]

## 2017-04-09 NOTE — Progress Notes (Signed)
[facility]  ROUNDING NOTE   Subjective:  Patient did have hemodialysis yesterday. Extremities some shortness of breath. Thoracentesis being considered.  Objective:  Vital signs in last 24 hours:  Temp:  [97.8 F (36.6 C)-98.8 F (37.1 C)] 98.8 F (37.1 C) (08/27 0800) Pulse Rate:  [63-111] 77 (08/27 0800) Resp:  [14-30] 21 (08/27 0800) BP: (136-234)/(87-131) 145/105 (08/27 0800) SpO2:  [97 %-100 %] 100 % (08/27 0800)  Weight change:  Filed Weights   04/07/17 0800 04/07/17 0930 04/07/17 1230  Weight: (!) 147 kg (324 lb 1.2 oz) (!) 154.5 kg (340 lb 9.8 oz) (!) 151.5 kg (334 lb)    Intake/Output: I/O last 3 completed shifts: In: 1234.8 [P.O.:440; I.V.:557.8; NG/GT:237] Out: 3747 [Urine:350; Other:3397]   Intake/Output this shift:  No intake/output data recorded.  Physical Exam: General: Critically ill appearing, laying in bed  Head: +temporal wasting  Eyes: Anicteric  Neck: obese  Lungs:  Diminished bilaterally, Normal effort   Heart: S1S2 no rubs  Abdomen:  Soft, nontender, obese  Extremities: 2+ b/l LE edema  Neurologic: significant generalized muscle wasting  Skin: Prurigo lesions over legs and thighs, skin lesions on b/l legs  Access: Rt IJ temp cath    Basic Metabolic Panel:  Recent Labs Lab 04/03/17 0351 04/05/17 0348 04/06/17 0402 04/07/17 0504  NA 142 142 143 139  K 4.4 4.3 4.4 4.5  CL 107 106 106 103  CO2 26 25 26 24   GLUCOSE 108* 84 91 83  BUN 97* 96* 81* 87*  CREATININE 6.38* 6.28* 5.80* 6.09*  CALCIUM 6.7* 7.1* 7.4* 7.3*    Liver Function Tests: No results for input(s): AST, ALT, ALKPHOS, BILITOT, PROT, ALBUMIN in the last 168 hours. No results for input(s): LIPASE, AMYLASE in the last 168 hours. No results for input(s): AMMONIA in the last 168 hours.  CBC:  Recent Labs Lab 04/03/17 0351 04/06/17 0402 04/07/17 0504  WBC 12.5* 14.3* 12.3*  HGB 8.3* 8.6* 8.3*  HCT 26.0* 27.7* 26.4*  MCV 81.8 81.6 83.5  PLT 433 484*  432    Cardiac Enzymes:  Recent Labs Lab 04/05/17 2334  CKMB 6.8*    BNP: Invalid input(s): POCBNP  CBG:  Recent Labs Lab 04/03/17 0728 04/03/17 1112 04/03/17 1628 04/06/17 2201 04/06/17 2246  GLUCAP 83 91 93 98 95    Microbiology: Results for orders placed or performed during the hospital encounter of 03/30/17  Aerobic/Anaerobic Culture (surgical/deep wound)     Status: Abnormal   Collection Time: 03/30/17  7:56 PM  Result Value Ref Range Status   Specimen Description WOUND LEFT THIGH  Final   Special Requests NONE  Final   Gram Stain   Final    ABUNDANT WBC PRESENT, PREDOMINANTLY PMN ABUNDANT GRAM POSITIVE COCCI ABUNDANT GRAM NEGATIVE RODS MODERATE GRAM POSITIVE RODS MODERATE GRAM NEGATIVE COCCI    Culture (A)  Final    MULTIPLE ORGANISMS PRESENT, NONE PREDOMINANT NO ANAEROBES ISOLATED Performed at [facility], [address], [place], [place] [address]    Report Status 04/05/2017 FINAL  Final  MRSA PCR Screening     Status: Abnormal   Collection Time: 03/30/17  8:58 PM  Result Value Ref Range Status   MRSA by PCR POSITIVE (A) NEGATIVE Final    Comment:        The GeneXpert MRSA Assay (FDA approved for NASAL specimens only), is one component of a comprehensive MRSA colonization surveillance program. It is not intended to diagnose MRSA infection nor to  guide or monitor treatment for MRSA infections. RESULT CALLED TO, READ BACK BY AND VERIFIED WITH: KARA CAMPBELL AT 2230 03/30/17.PMH   Body fluid culture     Status: None   Collection Time: 04/02/17 11:50 AM  Result Value Ref Range Status   Specimen Description PLEURAL  Final   Special Requests NONE  Final   Gram Stain NO WBC SEEN NO ORGANISMS SEEN   Final   Culture   Final    NO GROWTH 3 DAYS Performed at [facility], [address], [place], [place] [address]    Report Status 04/05/2017 FINAL  Final  Acid Fast Smear (AFB)     Status: None   Collection Time: 04/02/17 11:50 AM   Result Value Ref Range Status   AFB Specimen Processing Concentration  Final   Acid Fast Smear Negative  Final    Comment: (NOTE) Performed At: [facility] [address] [place], [place] [id number] Mila Homer MD [id number]    Source (AFB) PLEURAL  Final    Coagulation Studies: No results for input(s): LABPROT, INR in the last 72 hours.  Urinalysis: No results for input(s): COLORURINE, LABSPEC, PHURINE, GLUCOSEU, HGBUR, BILIRUBINUR, KETONESUR, PROTEINUR, UROBILINOGEN, NITRITE, LEUKOCYTESUR in the last 72 hours.  Invalid input(s): APPERANCEUR    Imaging: US Pelvis Limited  Result Date: 04/08/2017 CLINICAL DATA:  Urinary retention. EXAM: LIMITED ULTRASOUND OF PELVIS TECHNIQUE: Limited transabdominal ultrasound examination of the pelvis was performed to evaluate the urinary bladder. COMPARISON:  None. FINDINGS: Urinary bladder is nondilated, with calculated bladder volume of 134 mL. Poorly defined intraluminal echogenic area is seen, which may be due to artifact, blood clot, or mass. IMPRESSION: Nondilated urinary bladder. Intraluminal echogenic area which may be due to artifact, blood clot, or mass . Recommend follow-up ultrasound with full urinary bladder, or CT for further evaluation. Electronically Signed   By: Myles Rosenthal M.D.   On: 04/08/2017 12:31   US Abdomen Limited  Result Date: 04/08/2017 CLINICAL DATA:  Abdominal distention. EXAM: LIMITED ABDOMEN ULTRASOUND FOR ASCITES TECHNIQUE: Limited ultrasound survey for ascites was performed in all four abdominal quadrants. COMPARISON:  None. FINDINGS: Large amount of ascites is seen in all 4 abdominal quadrants. IMPRESSION: Large amount of ascites. Electronically Signed   By: Myles Rosenthal M.D.   On: 04/08/2017 12:32     Medications:   . dexmedetomidine (PRECEDEX) IV infusion Stopped (04/08/17 1338)   . bisoprolol  10 mg Oral Daily  . buPROPion  100 mg Oral BID  . collagenase   Topical Daily  . docusate sodium   100 mg Oral BID  . feeding supplement (NEPRO CARB STEADY)  237 mL Oral TID BM  . ferrous sulfate  325 mg Oral Q1200  . irbesartan  300 mg Oral QHS  . levothyroxine  75 mcg Oral QAC breakfast  . multivitamin  1 tablet Oral QHS   acetaminophen **OR** [DISCONTINUED] acetaminophen, hydrALAZINE, ipratropium-albuterol, LORazepam, morphine injection, [DISCONTINUED] ondansetron **OR** ondansetron (ZOFRAN) IV, traMADol  Assessment/ Plan:  [name] is a 37 y.o. white male with hypertension, morbid obesity, pulmonary hypertension, hypothyroidism, hyperlipidemia, depression/anxiety, coronary artery disease, congestive heart failure, who was admitted to [facility] on 03/30/2017  1. Acute renal failure on chronic kidney disease stage III with nephrotic range proteinuria, hematuria and glucosuria No baseline creatinine available. Creatinine of 1.3 in 12/2014 (care everywhere).  Concern for progression of chronic kidney disease to CKD stage V.  Patient has Nephrotic syndrome of unknown etiology. He states he has  been dealing with edema for over 2-3 yeas but did not seek medical care. Renal ultrasound report reviewed. HIV negative Not a candidate for kidney biopsy due to body habitus/ in ability to lie prone/ current infection  - 1st HD on 8/20; PPD has been placed on 8/20 -  Had HD yesterday, will plan for HD again tomorrow.   2. Anasarca, with HTN. Echocardiogram with normal systolic function 8/18. No visualization of hepatic cirrhosis on CT abd/pelvis -  Continue ultrafiltration with HD.   3. Hyperphosphatemia.   PTH 60  -  Recheck phos with next dialysis treatment.   4. Anemia of chronic kidney disease: hemoglobin 8.3 -  Avoid Iv iron due to current infection, may need to consider epogen soon as well.   5. Generalized weakness with muscle wasting Neurology evaluation in progress - hold statin  6. B/l pleural effusion -  Thoracentesis is being considered   LOS: 10 Marcellas Marchant,  Connie Lasater 8/27/20189:15 AM

## 2017-04-09 NOTE — Procedures (Signed)
Interventional Radiology Procedure Note  Procedure: US guided left thoracentesis  Complications: None  Estimated Blood Loss: None  2 liters of clear, yellowish fluid removed.  Post CXR pending.  Jodi Marble. Fredia Sorrow, M.D Pager:  [phone]

## 2017-04-09 NOTE — Progress Notes (Signed)
Pt taken off bipap, stated he feels better, placed on 4lpm [place], sats 99%, respiratory rate 20/min, will continue to monitor

## 2017-04-09 NOTE — Progress Notes (Signed)
[facility] Critical Care Medicine Progess Note    SYNOPSIS   [name] is a 36 year old male with past medical history significant for depression, diastolic heart failure, hypertension, hypothyroidism, obesity, stage III chronic kidney disease, pulmonary hypertension and obesity. Patient presented to [facility] on 8/17 with large left-sided pleural effusion. He also had large necrotic pressure ulcer on his left thigh requiring sharp excisional debridement by vascular, which was done on 8/18. Patient had thoracentesis on 8/20 with removal of 2.8 L of pleural fluid. Postthoracentesis patient developed pneumothorax and was on BiPAP. Patient was moved to MedSurg floor on 8/21. On 8/25 patient was moved back to the SDUwith shortness of breath. PH-7.15, CO2-70.Patient was placed on BiPAP. CXR on 8/25 is concerning for bilateral pleural effusion with moderate to let large left-sided pleural effusion.  ASSESSMENT/PLAN   Acute on chronic hypercarbic respiratory failure. Significantly improved today. On supplemental oxygen  Obesity Hypoventilation Syndrome  Moderate to large left sided pleural efussion. Consult has been put into interventional radiology for therapeutic thoracentesis.Patient is clinically significantly improved  Chronic Kidney disease. On hemodialysis, was dialyzed yesterday  Hypothyroidism. Patient is on replacement therapy  Ok to transfer to gen med floor   INTAKE / OUTPUT:  Intake/Output Summary (Last 24 hours) at 04/09/17 0929 Last data filed at 04/09/17 0208  Gross per 24 hour  Intake              677 ml  Output             3747 ml  Net            -3070 ml    Name: [name] MRN: [id number] DOB: 09-09-1980    ADMISSION DATE:  03/30/2017  SUBJECTIVE:  Patient status post hemodialysis, on noninvasiventilation, much more awake, arterial blood gas has been significantly improved. No other difficulties noted yesterday   VITAL SIGNS: Temp:   [97.8 F (36.6 C)-98.8 F (37.1 C)] 98.8 F (37.1 C) (08/27 0800) Pulse Rate:  [63-111] 77 (08/27 0800) Resp:  [14-30] 21 (08/27 0800) BP: (136-234)/(87-131) 145/105 (08/27 0800) SpO2:  [97 %-100 %] 100 % (08/27 0800)   Physical Examination:   VS: BP (!) 145/105   Pulse 77   Temp 98.8 F (37.1 C) (Oral)   Resp (!) 21   Ht 5\' 6"  (1.676 m)   Wt (!) 334 lb (151.5 kg)   SpO2 100%   BMI 53.91 kg/m   General Appearance: No distress . Pulmonary: diminished breath sounds but clear CardiovascularNormal S1,S2.  No m/r/g.   Abdomen: Benign, Soft, non-tender. Extremities: edema noted    LABORATORY PANEL:   CBC  Recent Labs Lab 04/07/17 0504  WBC 12.3*  HGB 8.3*  HCT 26.4*  PLT 432    Chemistries   Recent Labs Lab 04/07/17 0504  NA 139  K 4.5  CL 103  CO2 24  GLUCOSE 83  BUN 87*  CREATININE 6.09*  CALCIUM 7.3*     Recent Labs Lab 04/03/17 0354 04/03/17 0728 04/03/17 1112 04/03/17 1628 04/06/17 2201 04/06/17 2246  GLUCAP 96 83 91 93 98 95    Recent Labs Lab 04/07/17 0230 04/07/17 0630 04/08/17 0837  PHART 7.16* 7.16* 7.37  PCO2ART 74* 74* 48  PO2ART 140* 136* 97   No results for input(s): AST, ALT, ALKPHOS, BILITOT, ALBUMIN in the last 168 hours.  Cardiac Enzymes No results for input(s): TROPONINI in the last 168 hours.    Ok to transfer to gen med floor  today Will sign off once transferred  Lucie Leather, M.D.  [facility]  Medical Director [facility] Medical Director [facility] Cardio-Pulmonary Department

## 2017-04-09 NOTE — Progress Notes (Signed)
Negative Inspiratory Force was -28 cm/H2O  FVC was refused by patient, did not want to do at this time

## 2017-04-09 NOTE — Progress Notes (Signed)
Upon finishing a CHG bath, during which the patient adamantly refused to allow [place] to roll him onto either of his sides to reposition him, check his behind for breakdown or check for placement of the pink foam placed by the unit. He said he was rolled earlier in the day by the other shift and was not be moved from his current position. Educated patient again upon the importance of moving in bed to relieve pressure on his behind. Patient did instruct staff to put two pink foams, one on each of the outer,  Undersides of his lower legs, near the under/outersides of his shins. There was no visible breakdown but he wanted those pads placed on each side and he did allow [place] to elevate his heels on pillows at that time for a brief moment. Education is an ongoing process with this patient. He has complied with his nutritional drinks ordered.

## 2017-04-09 NOTE — Progress Notes (Signed)
[employer] [facility] [place] [facility]   PATIENT NAME: Gurshaan Matsuoka    MR#:  [id number]  DATE OF BIRTH:  Apr 13, 1981  SUBJECTIVE:     The patient alert and awake, on BIPAP, s/p thoracentesis today.  REVIEW OF SYSTEMS:    Review of Systems  Constitutional: Negative for fever, chills weight loss  Positive generalized weakness  HENT: Negative for ear pain, nosebleeds, congestion, facial swelling, rhinorrhea, neck pain, neck stiffness and ear discharge.   Respiratory: Negative for cough, mild shortness of breath, no wheezing  Cardiovascular: Negative for chest pain, palpitations and positive leg swelling.  Gastrointestinal: Negative for heartburn, abdominal pain, vomiting, diarrhea or consitpation Genitourinary: Negative for dysuria, urgency, frequency, hematuria Musculoskeletal: Negative for back pain or joint pain Neurological: Negative for dizziness, seizures, syncope, focal weakness,  numbness and headaches.  Hematological: Does not bruise/bleed easily.  Psychiatric/Behavioral: Negative for hallucinations, confusion, dysphoric mood Skin: no rash. DRUG ALLERGIES:  No Known Allergies  VITALS:  Blood pressure (!) 145/105, pulse 77, temperature 98.8 F (37.1 C), temperature source Oral, resp. rate (!) 21, height 5\' 6"  (1.676 m), weight (!) 334 lb (151.5 kg), SpO2 100 %.  PHYSICAL EXAMINATION:  Constitutional: Appears obese anasarca. NAD. Morbid obesity. onBIPAP. HENT: Normocephalic. [name] Oropharynx is clear and moist.  Eyes: Conjunctivae and EOM are normal. PERRLA, no scleral icterus.  Neck: Normal ROM. Neck supple. No JVD. No tracheal deviation. CVS: RRR, S1/S2 +, no murmurs, no gallops, no carotid bruit.  Pulmonary: Decreased breath sounds with scant bilateral wheezing at bases Abdominal: abdominal wall edema  Due to body habitus unable to appreciate for organomegaly BS + no rebound guarding Musculoskeletal: Normal range of motion. +anasarca no tenderness.    Neuro: Alert. CN 2-12 grossly intact. No focal deficits. Power 0-1/5 in lower extremities and 3-4/5 in upper extremities. Skin:left thigh covered wound   Right femoral dialysis catheter placed Psychiatric:alert and awake. LABORATORY PANEL:   CBC  Recent Labs Lab 04/07/17 0504  WBC 12.3*  HGB 8.3*  HCT 26.4*  PLT 432   ------------------------------------------------------------------------------------------------------------------  Chemistries   Recent Labs Lab 04/07/17 0504  NA 139  K 4.5  CL 103  CO2 24  GLUCOSE 83  BUN 87*  CREATININE 6.09*  CALCIUM 7.3*   ------------------------------------------------------------------------------------------------------------------  Cardiac Enzymes No results for input(s): TROPONINI in the last 168 hours. ------------------------------------------------------------------------------------------------------------------  RADIOLOGY:  US Pelvis Limited  Result Date: 04/08/2017 CLINICAL DATA:  Urinary retention. EXAM: LIMITED ULTRASOUND OF PELVIS TECHNIQUE: Limited transabdominal ultrasound examination of the pelvis was performed to evaluate the urinary bladder. COMPARISON:  None. FINDINGS: Urinary bladder is nondilated, with calculated bladder volume of 134 mL. Poorly defined intraluminal echogenic area is seen, which may be due to artifact, blood clot, or mass. IMPRESSION: Nondilated urinary bladder. Intraluminal echogenic area which may be due to artifact, blood clot, or mass . Recommend follow-up ultrasound with full urinary bladder, or CT for further evaluation. Electronically Signed   By: Myles Rosenthal M.D.   On: 04/08/2017 12:31   US Abdomen Limited  Result Date: 04/08/2017 CLINICAL DATA:  Abdominal distention. EXAM: LIMITED ABDOMEN ULTRASOUND FOR ASCITES TECHNIQUE: Limited ultrasound survey for ascites was performed in all four abdominal quadrants. COMPARISON:  None. FINDINGS: Large amount of ascites is seen in all 4 abdominal  quadrants. IMPRESSION: Large amount of ascites. Electronically Signed   By: Myles Rosenthal M.D.   On: 04/08/2017 12:32   US Thoracentesis Asp Pleural Space W/img Guide  Result Date: 04/09/2017 CLINICAL DATA:  Recurrent  left pleural effusion and respiratory failure. EXAM: ULTRASOUND GUIDED LEFT THORACENTESIS COMPARISON:  04/02/2017 PROCEDURE: An ultrasound guided thoracentesis was thoroughly discussed with the patient and questions answered. The benefits, risks, alternatives and complications were also discussed. The patient understands and wishes to proceed with the procedure. Written consent was obtained. A time-out was performed prior to performing the procedure. Ultrasound was performed to localize and mark an adequate pocket of fluid in the left chest. The area was then prepped and draped in the normal sterile fashion. 1% Lidocaine was used for local anesthesia. Under ultrasound guidance a 6 French Safe-T-Centesis catheter was introduced. Thoracentesis was performed. The catheter was removed and a dressing applied. COMPLICATIONS: None FINDINGS: A total of approximately 2 L of clear, yellow fluid was removed. IMPRESSION: Successful ultrasound guided left thoracentesis yielding 2 L of pleural fluid. Electronically Signed   By: Irish Lack M.D.   On: 04/09/2017 12:54     ASSESSMENT AND PLAN:   36 year old male with history of chronic diastolic heart failure, primary pulmonary hypertension and hypothyroidism with chronic kidney disease stage III who presents for [facility] due to shortness of breath.   1. Acute on chronic hypoxic respiratory failure with hypoxia in the setting of fluid overload as well as small pneumothorax After thoracentesis Patient titrated off of BiPAP and on nasal cannula 4L. Pneumothorax has resolved  Pulmonologist recommended an chest x-ray in one week which would be this Wednesday. He was put on BiPAP again due to hypercapnia. NEB.  BIPAP prn, continue O2 [place] 2L.  Continue NEB.  Obesity Hypoventilation Syndrome. BIPAP prn.  * Bilateral moderate to large pleural effusion. S/p US guided thoracentesis. 2 liters of clear, yellowish fluid removed. F/u CXR.  2 Acute on chronic kidney failure Stage III:  progression of chronic kidney disease to chronic kidney disease stage V Nephrology consult appreciated. Renal ultrasound showed no hydronephrosis  * Ascites. Large amount of ascites per [place]. Need paracentesis. S/p U/s guideded paracentesis status post 2.8 L of fluid on August 20.  Patient Has started dialysis which was initiated August 20. Not a candidate for kidney biopsy due to body habitus HD today, IV albumin with HD. Per Dr. Thedore Mins, will probably continue to need chronic dialysis.  3.  acute on chronic diastolic heart failure and pulmonary hypertension and anasarca Continue hemodialysis  Echocardiogram shows normal ejection fraction without major valvular abnormalities. Continue HD per Dr. Cherylann Ratel.  4. Left thigh wound:  Status post bedside debridement by surgeon. MULTIPLE ORGANISMS PRESENT, NONE PREDOMINANT. NO ANAEROBES ISOLATED per wound cultures.  On vancomycin and cefepime and stop antibiotics on August 25. Pressure offloading Daily dressing changes with Santyl.  4. Accelerated Essential hypertension: Continue irbesartan and Bisoprolol, resumed Clonidine due to accelerated hypertension. Hold it due to low side BP. SBP goal 130-150 to allow for adequate fluid removal with HD per Dr. Thedore Mins.  5. Hypothyroid: Continue Synthroid 6. Hyperlipidemia: Continue statin 7. 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 Hemoglobin is stable.  Generalized weakness with muscle wasting Hold statin. Per Dr. Thad Ranger, may need EMG as outpatient.   Physical therapy is recommending the patient return back to skilled nursing facility once  stable for discharge Management plans discussed with the patient and he is in agreement.  CODE STATUS: full  TOTAL TIME TAKING CARE OF THIS PATIENT: 33 minutes.   POSSIBLE D/C 3  days, DEPENDING ON CLINICAL CONDITION.  Shaune Pollack M.D on 04/09/2017 at 1:18 PM  Between 7am to 6pm - Pager - [phone] After 6pm go to [email or web address] - password [email or web address]  [facility] [facility]  Office  [phone]  CC: Primary care physician; Patient, No Pcp Per  Note: This dictation was prepared with Dragon dictation along with smaller phrase technology. Any transcriptional errors that result from this process are unintentional.

## 2017-04-10 LAB — PHOSPHORUS: Phosphorus: 6.5 mg/dL — ABNORMAL HIGH (ref 2.5–4.6)

## 2017-04-10 MED ORDER — ALBUMIN HUMAN 25 % IV SOLN
25.0000 g | INTRAVENOUS | Status: DC | PRN
  Administered 2017-04-10 – 2017-04-14 (×2): 25 g via INTRAVENOUS
  Filled 2017-04-10 (×2): qty 100

## 2017-04-10 MED ORDER — APIXABAN 5 MG PO TABS
5.0000 mg | ORAL_TABLET | Freq: Two times a day (BID) | ORAL | Status: DC
  Administered 2017-04-10 – 2017-04-11 (×2): 5 mg via ORAL
  Filled 2017-04-10 (×2): qty 1

## 2017-04-10 NOTE — Progress Notes (Signed)
Patient resting in bed at this time. Complained of 6/10 pain of bilateral legs earlier. 2mg  Morphine was given to patient and was successful in decreasing his pain to a 2/10. Patient wearing Bi-Pap without any complaints. Patient continues to refuse to be turned and repositioned by staff but is in the bariatric low air loss rotation mattress. Will continue to monitor.

## 2017-04-10 NOTE — Care Management (Addendum)
Transferred back from ICU to med-surg. Have texted Marchelle Folks with [employer] regarding OP dialysis  Spoke with Marchelle Folks. His insurance does not cover AKI. It will cover ESRD. Patient may be more appropriate for LTACH.

## 2017-04-10 NOTE — Progress Notes (Signed)
[facility] - [place] at [facility]   PATIENT NAME: Darik Amenta    MR#:  [id number]  DATE OF BIRTH:  Jun 27, 1981  SUBJECTIVE:     The patient alert and awake, off BIPAP, on O2 [place] 4L. On HD.  REVIEW OF SYSTEMS:    Review of Systems  Constitutional: Negative for fever, chills weight loss  Positive generalized weakness  HENT: Negative for ear pain, nosebleeds, congestion, facial swelling, rhinorrhea, neck pain, neck stiffness and ear discharge.   Respiratory: Negative for cough, mild shortness of breath, no wheezing  Cardiovascular: Negative for chest pain, palpitations and better leg swelling.  Gastrointestinal: Negative for heartburn, abdominal pain, vomiting, diarrhea or consitpation Genitourinary: Negative for dysuria, urgency, frequency, hematuria Musculoskeletal: Negative for back pain or joint pain Neurological: Negative for dizziness, seizures, syncope, focal weakness,  numbness and headaches.  Hematological: Does not bruise/bleed easily.  Psychiatric/Behavioral: Negative for hallucinations, confusion, dysphoric mood Skin: no rash. DRUG ALLERGIES:  No Known Allergies  VITALS:  Blood pressure (!) 147/80, pulse 64, temperature 98 F (36.7 C), temperature source Oral, resp. rate 12, height 5\' 6"  (1.676 m), weight (!) 334 lb (151.5 kg), SpO2 100 %.  PHYSICAL EXAMINATION:  Constitutional: Appears obese anasarca. NAD. Morbid obesity. onBIPAP. HENT: Normocephalic. [name] Oropharynx is clear and moist.  Eyes: Conjunctivae and EOM are normal. PERRLA, no scleral icterus.  Neck: Normal ROM. Neck supple. No JVD. No tracheal deviation. CVS: RRR, S1/S2 +, no murmurs, no gallops, no carotid bruit.  Pulmonary: Decreased breath sounds with scant bilateral wheezing at bases Abdominal: abdominal wall edema  Due to body habitus unable to appreciate for organomegaly BS + no rebound guarding Musculoskeletal: Normal range of motion. +anasarca no tenderness.   Neuro: Alert. CN  2-12 grossly intact. No focal deficits. Power 0-1/5 in lower extremities and 3-4/5 in upper extremities. Skin:left thigh covered wound   Right femoral dialysis catheter placed Psychiatric:alert and awake. LABORATORY PANEL:   CBC  Recent Labs Lab 04/07/17 0504  WBC 12.3*  HGB 8.3*  HCT 26.4*  PLT 432   ------------------------------------------------------------------------------------------------------------------  Chemistries   Recent Labs Lab 04/07/17 0504  NA 139  K 4.5  CL 103  CO2 24  GLUCOSE 83  BUN 87*  CREATININE 6.09*  CALCIUM 7.3*   ------------------------------------------------------------------------------------------------------------------  Cardiac Enzymes No results for input(s): TROPONINI in the last 168 hours. ------------------------------------------------------------------------------------------------------------------  RADIOLOGY:  Dg Chest 1 View  Result Date: 04/09/2017 CLINICAL DATA:  Status post left thoracentesis EXAM: CHEST 1 VIEW COMPARISON:  04/06/2017 chest radiograph. FINDINGS: Right internal jugular central venous catheter terminates over the right atrium, unchanged. Stable cardiomediastinal silhouette with borderline enlarged cardiopericardial silhouette. No pneumothorax. Small left pleural effusion, significantly decreased. Stable small right pleural effusion. Low lung volumes. Mild pulmonary edema, asymmetric to the right, not appreciably changed. Mild bibasilar atelectasis, decreased on the left and stable on the right. IMPRESSION: 1. No pneumothorax. 2. Small bilateral pleural effusions, right greater the left, significantly decreased on the left and stable on the right . 3. Mild pulmonary edema, asymmetric to the right, stable. 4. Mild bibasilar atelectasis, decreased on the left and stable on the right. Electronically Signed   By: Delbert Phenix M.D.   On: 04/09/2017 14:02   US Thoracentesis Asp Pleural Space W/img Guide  Result  Date: 04/09/2017 CLINICAL DATA:  Recurrent left pleural effusion and respiratory failure. EXAM: ULTRASOUND GUIDED LEFT THORACENTESIS COMPARISON:  04/02/2017 PROCEDURE: An ultrasound guided thoracentesis was thoroughly discussed with the patient and questions answered. The benefits,  risks, alternatives and complications were also discussed. The patient understands and wishes to proceed with the procedure. Written consent was obtained. A time-out was performed prior to performing the procedure. Ultrasound was performed to localize and mark an adequate pocket of fluid in the left chest. The area was then prepped and draped in the normal sterile fashion. 1% Lidocaine was used for local anesthesia. Under ultrasound guidance a 6 French Safe-T-Centesis catheter was introduced. Thoracentesis was performed. The catheter was removed and a dressing applied. COMPLICATIONS: None FINDINGS: A total of approximately 2 L of clear, yellow fluid was removed. IMPRESSION: Successful ultrasound guided left thoracentesis yielding 2 L of pleural fluid. Electronically Signed   By: Irish Lack M.D.   On: 04/09/2017 12:54     ASSESSMENT AND PLAN:   36 year old male with history of chronic diastolic heart failure, primary pulmonary hypertension and hypothyroidism with chronic kidney disease stage III who presents for [facility] due to shortness of breath.   1. Acute on chronic hypoxic respiratory failure with hypoxia in the setting of fluid overload as well as small pneumothorax After thoracentesis Patient titrated off of BiPAP and on nasal cannula 4L. Pneumothorax has resolved  Pulmonologist recommended an chest x-ray in one week which would be this Wednesday. He was put on BiPAP again due to hypercapnia. NEB.  BIPAP prn, continue O2 [place] 4 L. Continue NEB.  Obesity Hypoventilation Syndrome. BIPAP prn.  * Bilateral moderate to large pleural effusion. S/p US guided thoracentesis. 2 liters of clear, yellowish fluid  removed. CXR: Small bilateral pleural effusions, right greater the left, significantly decreased on the left and stable on the right.  2 Acute on chronic kidney failure Stage III:  progression of chronic kidney disease to chronic kidney disease stage V Nephrology consult appreciated. Renal ultrasound showed no hydronephrosis Patient Has started dialysis which was initiated August 20. Not a candidate for kidney biopsy due to body habitus HD today, IV albumin with HD. Per Dr. Thedore Mins, will probably continue to need chronic dialysis.  * Ascites. Large amount of ascites per [place]. May need paracentesis. Repeat US tomorrow. S/p U/s guideded paracentesis status post 2.8 L of fluid on August 20.  3.  Acute on chronic diastolic heart failure and pulmonary hypertension and anasarca Continue hemodialysis  Echocardiogram shows normal ejection fraction without major valvular abnormalities. Continue HD per Dr. Cherylann Ratel.  4. Left thigh wound:  Status post bedside debridement by surgeon. MULTIPLE ORGANISMS PRESENT, NONE PREDOMINANT. NO ANAEROBES ISOLATED per wound cultures.  On vancomycin and cefepime and stopped antibiotics on August 25. Pressure offloading Daily dressing changes with Santyl.  4. Accelerated Essential hypertension: Continue irbesartan and Bisoprolol, resumed Clonidine due to accelerated hypertension, but discontinued since SBP goal 130-150 to allow for adequate fluid removal with HD per Dr. Thedore Mins.  5. Hypothyroid: Continue Synthroid 6. Hyperlipidemia: Continue statin 7. 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 Hemoglobin is stable.   Generalized weakness with muscle wasting Hold statin. Per Dr. Thad Ranger, may need EMG as outpatient.   Physical therapy is recommending the patient return back to skilled nursing facility once stable for discharge Management plans  discussed with the patient and he is in agreement.  CODE STATUS: full  TOTAL TIME TAKING CARE OF THIS PATIENT: 37 minutes.   POSSIBLE D/C 3  days, DEPENDING ON CLINICAL CONDITION.    Shaune Pollack M.D on 04/10/2017 at 3:26 PM  Between 7am to 6pm - Pager - [phone] After 6pm go to [email or web address] - password [email or web address]  [facility]  Office  [phone]  CC: Primary care physician; Patient, No Pcp Per  Note: This dictation was prepared with Dragon dictation along with smaller phrase technology. Any transcriptional errors that result from this process are unintentional.

## 2017-04-10 NOTE — Progress Notes (Signed)
Pt placed on BIPAP at 0130.

## 2017-04-10 NOTE — Progress Notes (Signed)
Patient transferred to med-surg unit room 222. Report given to [place], [place]

## 2017-04-10 NOTE — Progress Notes (Signed)
Subjective: Patient with respiratory compromise and required ICU admission.  Currently improved.     Objective: Current vital signs: BP (!) 143/84 (BP Location: Right Arm)   Pulse 63   Temp 98.1 F (36.7 C) (Oral)   Resp 12   Ht 5\' 6"  (1.676 m)   Wt (!) 151.5 kg (334 lb)   SpO2 99%   BMI 53.91 kg/m  Vital signs in last 24 hours: Temp:  [97.6 F (36.4 C)-98.7 F (37.1 C)] 98.1 F (36.7 C) (08/28 1000) Pulse Rate:  [63-85] 63 (08/28 1030) Resp:  [12-38] 12 (08/28 1030) BP: (118-167)/(76-108) 143/84 (08/28 1030) SpO2:  [91 %-100 %] 99 % (08/28 1030)  Intake/Output from previous day: 08/27 0701 - 08/28 0700 In: 477 [P.O.:240; NG/GT:237] Out: 550 [Urine:550] Intake/Output this shift: Total I/O In: -  Out: 250 [Urine:250] Nutritional status: Diet renal/carb modified with fluid restriction Diet-HS Snack? Nothing; Room service appropriate? Yes; Fluid consistency: Thin  Neurologic Exam: Mental Status: Alert, oriented, thought content appropriate.  Speech fluent without evidence of aphasia.  Able to follow 3 step commands without difficulty. Cranial Nerves: II: Discs flat bilaterally; Visual fields grossly normal, pupils equal, round, reactive to light and accommodation III,IV, VI: ptosis not present, no fatigue, extra-ocular motions intact bilaterally V,VII: smile symmetric and full, facial light touch sensation normal bilaterally VIII: hearing normal bilaterally IX,X: gag reflex present XI: bilateral shoulder shrug XII: midline tongue extension Motor: Patient able to lift all extremities against gravity at 5-/5 strength.  Temporal wasting Sensory: Pinprick and light touch intact throughout, bilaterally  Lab Results: Basic Metabolic Panel:  Recent Labs Lab 04/05/17 0348 04/06/17 0402 04/07/17 0504  NA 142 143 139  K 4.3 4.4 4.5  CL 106 106 103  CO2 25 26 24   GLUCOSE 84 91 83  BUN 96* 81* 87*  CREATININE 6.28* 5.80* 6.09*  CALCIUM 7.1* 7.4* 7.3*    Liver  Function Tests: No results for input(s): AST, ALT, ALKPHOS, BILITOT, PROT, ALBUMIN in the last 168 hours. No results for input(s): LIPASE, AMYLASE in the last 168 hours. No results for input(s): AMMONIA in the last 168 hours.  CBC:  Recent Labs Lab 04/06/17 0402 04/07/17 0504  WBC 14.3* 12.3*  HGB 8.6* 8.3*  HCT 27.7* 26.4*  MCV 81.6 83.5  PLT 484* 432    Cardiac Enzymes:  Recent Labs Lab 04/05/17 2334  CKMB 6.8*    Lipid Panel: No results for input(s): CHOL, TRIG, HDL, CHOLHDL, VLDL, LDLCALC in the last 168 hours.  CBG:  Recent Labs Lab 04/03/17 1112 04/03/17 1628 04/06/17 2201 04/06/17 2246  GLUCAP 91 93 98 95    Microbiology: Results for orders placed or performed during the hospital encounter of 03/30/17  Aerobic/Anaerobic Culture (surgical/deep wound)     Status: Abnormal   Collection Time: 03/30/17  7:56 PM  Result Value Ref Range Status   Specimen Description WOUND LEFT THIGH  Final   Special Requests NONE  Final   Gram Stain   Final    ABUNDANT WBC PRESENT, PREDOMINANTLY PMN ABUNDANT GRAM POSITIVE COCCI ABUNDANT GRAM NEGATIVE RODS MODERATE GRAM POSITIVE RODS MODERATE GRAM NEGATIVE COCCI    Culture (A)  Final    MULTIPLE ORGANISMS PRESENT, NONE PREDOMINANT NO ANAEROBES ISOLATED Performed at [facility], [address], [place], [place] [address]    Report Status 04/05/2017 FINAL  Final  MRSA PCR Screening     Status: Abnormal   Collection Time: 03/30/17  8:58 PM  Result Value Ref  Range Status   MRSA by PCR POSITIVE (A) NEGATIVE Final    Comment:        The GeneXpert MRSA Assay (FDA approved for NASAL specimens only), is one component of a comprehensive MRSA colonization surveillance program. It is not intended to diagnose MRSA infection nor to guide or monitor treatment for MRSA infections. RESULT CALLED TO, READ BACK BY AND VERIFIED WITH: KARA CAMPBELL AT 2230 03/30/17.PMH   Body fluid culture     Status: None   Collection  Time: 04/02/17 11:50 AM  Result Value Ref Range Status   Specimen Description PLEURAL  Final   Special Requests NONE  Final   Gram Stain NO WBC SEEN NO ORGANISMS SEEN   Final   Culture   Final    NO GROWTH 3 DAYS Performed at [facility], [address], [place], [place] [address]    Report Status 04/05/2017 FINAL  Final  Acid Fast Smear (AFB)     Status: None   Collection Time: 04/02/17 11:50 AM  Result Value Ref Range Status   AFB Specimen Processing Concentration  Final   Acid Fast Smear Negative  Final    Comment: (NOTE) Performed At: [facility] [address] [place], [place] [id number] Mila Homer MD [id number]    Source (AFB) PLEURAL  Final    Coagulation Studies: No results for input(s): LABPROT, INR in the last 72 hours.  Imaging: Dg Chest 1 View  Result Date: 04/09/2017 CLINICAL DATA:  Status post left thoracentesis EXAM: CHEST 1 VIEW COMPARISON:  04/06/2017 chest radiograph. FINDINGS: Right internal jugular central venous catheter terminates over the right atrium, unchanged. Stable cardiomediastinal silhouette with borderline enlarged cardiopericardial silhouette. No pneumothorax. Small left pleural effusion, significantly decreased. Stable small right pleural effusion. Low lung volumes. Mild pulmonary edema, asymmetric to the right, not appreciably changed. Mild bibasilar atelectasis, decreased on the left and stable on the right. IMPRESSION: 1. No pneumothorax. 2. Small bilateral pleural effusions, right greater the left, significantly decreased on the left and stable on the right . 3. Mild pulmonary edema, asymmetric to the right, stable. 4. Mild bibasilar atelectasis, decreased on the left and stable on the right. Electronically Signed   By: Delbert Phenix M.D.   On: 04/09/2017 14:02   US Pelvis Limited  Result Date: 04/08/2017 CLINICAL DATA:  Urinary retention. EXAM: LIMITED ULTRASOUND OF PELVIS TECHNIQUE: Limited transabdominal ultrasound  examination of the pelvis was performed to evaluate the urinary bladder. COMPARISON:  None. FINDINGS: Urinary bladder is nondilated, with calculated bladder volume of 134 mL. Poorly defined intraluminal echogenic area is seen, which may be due to artifact, blood clot, or mass. IMPRESSION: Nondilated urinary bladder. Intraluminal echogenic area which may be due to artifact, blood clot, or mass . Recommend follow-up ultrasound with full urinary bladder, or CT for further evaluation. Electronically Signed   By: Myles Rosenthal M.D.   On: 04/08/2017 12:31   US Abdomen Limited  Result Date: 04/08/2017 CLINICAL DATA:  Abdominal distention. EXAM: LIMITED ABDOMEN ULTRASOUND FOR ASCITES TECHNIQUE: Limited ultrasound survey for ascites was performed in all four abdominal quadrants. COMPARISON:  None. FINDINGS: Large amount of ascites is seen in all 4 abdominal quadrants. IMPRESSION: Large amount of ascites. Electronically Signed   By: Myles Rosenthal M.D.   On: 04/08/2017 12:32   US Thoracentesis Asp Pleural Space W/img Guide  Result Date: 04/09/2017 CLINICAL DATA:  Recurrent left pleural effusion and respiratory failure. EXAM: ULTRASOUND GUIDED LEFT THORACENTESIS COMPARISON:  04/02/2017 PROCEDURE:  An ultrasound guided thoracentesis was thoroughly discussed with the patient and questions answered. The benefits, risks, alternatives and complications were also discussed. The patient understands and wishes to proceed with the procedure. Written consent was obtained. A time-out was performed prior to performing the procedure. Ultrasound was performed to localize and mark an adequate pocket of fluid in the left chest. The area was then prepped and draped in the normal sterile fashion. 1% Lidocaine was used for local anesthesia. Under ultrasound guidance a 6 French Safe-T-Centesis catheter was introduced. Thoracentesis was performed. The catheter was removed and a dressing applied. COMPLICATIONS: None FINDINGS: A total of  approximately 2 L of clear, yellow fluid was removed. IMPRESSION: Successful ultrasound guided left thoracentesis yielding 2 L of pleural fluid. Electronically Signed   By: Irish Lack M.D.   On: 04/09/2017 12:54    Medications:  I have reviewed the patient's current medications. Scheduled: . amLODipine  5 mg Oral Daily  . bisoprolol  10 mg Oral Daily  . buPROPion  100 mg Oral BID  . collagenase   Topical Daily  . docusate sodium  100 mg Oral BID  . feeding supplement (NEPRO CARB STEADY)  237 mL Oral TID BM  . ferrous sulfate  325 mg Oral Q1200  . irbesartan  300 mg Oral QHS  . levothyroxine  75 mcg Oral QAC breakfast  . multivitamin  1 tablet Oral QHS    Assessment/Plan: Patient has not been particularly stable during this hospitalization.  Spoke with CC physician and will defer work up for weakness until outpatient setting when patient more stable.  No further work up indicated a this time.  Biopsy and NCV/EMG to be performed on an outpatient basis.  Statin has been discontinued.     LOS: 11 days   Thana Farr, MD Neurology [phone] 04/10/2017  10:38 AM

## 2017-04-10 NOTE — Progress Notes (Signed)
Nutrition Follow-up  DOCUMENTATION CODES:   Severe malnutrition in context of chronic illness, Obesity unspecified  INTERVENTION:  Continue Nepro Shake po TID, each supplement provides 425 kcal and 19 grams protein.  Continue rena-vit QHS.  Discussed increased need for protein on HD. Encouraged adequate intake of HBV protein at meals (meat, chicken, eggs). Can provide more thorough education on renal diet prior to discharge when patient is feeling better.  NUTRITION DIAGNOSIS:   Malnutrition (Severe) related to chronic illness (CKD, anasarca, CHF, lymphedema, stage III wound) as evidenced by severe depletion of body fat, severe depletion of muscle mass.  Ongoing.  GOAL:   Patient will meet greater than or equal to 90% of their needs  Progressing.  MONITOR:   PO intake, Supplement acceptance, Labs, Weight trends, Skin, I & O's  REASON FOR ASSESSMENT:   Consult Assessment of nutrition requirement/status  ASSESSMENT:   36 year old male with PMHx of HTN, bilateral lymphedema, anxiety, depression, HLD, diastolic CHF, primary pulmonary HTN, hypothyroidism, CKD stage III, who presented from [facility] in setting of dyspnea and acute renal failure (likely progression to CKD stage V), left pleural effusion, anasarca, also with left thigh wound likely pressure injury.  -Patient s/p thoracentesis 8/27 (2 L fluid removed)  Spoke with patient at bedside. He reports his appetite is slowly improving. He is ordering three meals per day. He is finishing approximately 75-100% of his meals now. He tries to eat the protein portion of meals. He likes Nepro. He is drinking 1-2 bottles per day. He reports he had a bowel movement yesterday, however in reviewing chart it was only a smear. Patient reports he has been refusing to take his stool softener.   Meal Completion: approximately 75-100% per patient report; has not been recorded in chart since dinner on 8/26 In the past 24 hrs patient  has had approximately 2000 kcal (86% minimum estimated kcal needs) and 93 grams of protein (76% minimum estimated protein needs).  Medications reviewed and include: Colace (pt reports he is not taking these), ferrous sulfate 325 mg daily, levothyroxine, rena-vit QHS, human albumin 25 grams IV every HD.  Labs reviewed: Phosphorus 6.5 (trending down from 9.1 on 8/19)  I/O: 450 ml UOP so far today; 3 L fluid removed in HD today, 3.397 L fluid removed during HD on 8/26, 3 L fluid removed during HD on 8/25, 2 L fluid removed during HD on 8/23  Weight trend: bed scale broken per chart; last weight was 151.5 kg on 8/25 after HD  Diet Order:  Diet renal/carb modified with fluid restriction Diet-HS Snack? Nothing; Room service appropriate? Yes; Fluid consistency: Thin  Skin:  Wound (see comment) (open wound left upper thigh (was stage III per surgery note))  Last BM:  04/09/2017 - type 6 (only smear)  Height:   Ht Readings from Last 1 Encounters:  04/06/17 5\' 6"  (1.676 m)    Weight:   Wt Readings from Last 1 Encounters:  No data found for Wt    Ideal Body Weight:  72.7 kg  BMI:  Body mass index is 53.91 kg/m.  Estimated Nutritional Needs:   Kcal:  2330-2540 (MSJ x 1.1-1.2 w/ renal adj wt)  Protein:  123-147 grams (1-1.2 grams/kg renal adj wt)  Fluid:  UOP + 1 L  EDUCATION NEEDS:   Education needs no appropriate at this time  Helane Rima, MS, RD, LDN Pager: [phone] After Hours Pager: [phone]

## 2017-04-10 NOTE — Progress Notes (Signed)
[facility]  ROUNDING NOTE   Subjective:  Patient due for dialysis today. Urine output was only 550 cc over the preceding 24 hours.   Objective:  Vital signs in last 24 hours:  Temp:  [97.6 F (36.4 C)-98.7 F (37.1 C)] 98.1 F (36.7 C) (08/28 0856) Pulse Rate:  [67-85] 71 (08/28 0900) Resp:  [13-38] 19 (08/28 0900) BP: (118-167)/(76-108) 153/93 (08/28 0900) SpO2:  [91 %-100 %] 100 % (08/28 0900)  Weight change:  Filed Weights   04/07/17 0800 04/07/17 0930 04/07/17 1230  Weight: (!) 147 kg (324 lb 1.2 oz) (!) 154.5 kg (340 lb 9.8 oz) (!) 151.5 kg (334 lb)    Intake/Output: I/O last 3 completed shifts: In: 1154 [P.O.:680; NG/GT:474] Out: 600 [Urine:600]   Intake/Output this shift:  Total I/O In: -  Out: 250 [Urine:250]  Physical Exam: General: Critically ill appearing, laying in bed  Head: +temporal wasting  Eyes: Anicteric  Neck: obese  Lungs:  Diminished bilaterally, Normal effort   Heart: S1S2 no rubs  Abdomen:  Soft, nontender, obese  Extremities: 2+ b/l LE edema  Neurologic: significant generalized muscle wasting  Skin: Prurigo lesions over legs and thighs, skin lesions on b/l legs  Access: Rt IJ temp cath    Basic Metabolic Panel:  Recent Labs Lab 04/05/17 0348 04/06/17 0402 04/07/17 0504  NA 142 143 139  K 4.3 4.4 4.5  CL 106 106 103  CO2 25 26 24   GLUCOSE 84 91 83  BUN 96* 81* 87*  CREATININE 6.28* 5.80* 6.09*  CALCIUM 7.1* 7.4* 7.3*    Liver Function Tests: No results for input(s): AST, ALT, ALKPHOS, BILITOT, PROT, ALBUMIN in the last 168 hours. No results for input(s): LIPASE, AMYLASE in the last 168 hours. No results for input(s): AMMONIA in the last 168 hours.  CBC:  Recent Labs Lab 04/06/17 0402 04/07/17 0504  WBC 14.3* 12.3*  HGB 8.6* 8.3*  HCT 27.7* 26.4*  MCV 81.6 83.5  PLT 484* 432    Cardiac Enzymes:  Recent Labs Lab 04/05/17 2334  CKMB 6.8*    BNP: Invalid input(s): POCBNP  CBG:  Recent  Labs Lab 04/03/17 1112 04/03/17 1628 04/06/17 2201 04/06/17 2246  GLUCAP 91 93 98 95    Microbiology: Results for orders placed or performed during the hospital encounter of 03/30/17  Aerobic/Anaerobic Culture (surgical/deep wound)     Status: Abnormal   Collection Time: 03/30/17  7:56 PM  Result Value Ref Range Status   Specimen Description WOUND LEFT THIGH  Final   Special Requests NONE  Final   Gram Stain   Final    ABUNDANT WBC PRESENT, PREDOMINANTLY PMN ABUNDANT GRAM POSITIVE COCCI ABUNDANT GRAM NEGATIVE RODS MODERATE GRAM POSITIVE RODS MODERATE GRAM NEGATIVE COCCI    Culture (A)  Final    MULTIPLE ORGANISMS PRESENT, NONE PREDOMINANT NO ANAEROBES ISOLATED Performed at [facility], [address] [place], [place] [address]    Report Status 04/05/2017 FINAL  Final  MRSA PCR Screening     Status: Abnormal   Collection Time: 03/30/17  8:58 PM  Result Value Ref Range Status   MRSA by PCR POSITIVE (A) NEGATIVE Final    Comment:        The GeneXpert MRSA Assay (FDA approved for NASAL specimens only), is one component of a comprehensive MRSA colonization surveillance program. It is not intended to diagnose MRSA infection nor to guide or monitor treatment for MRSA infections. RESULT CALLED TO, READ BACK BY AND  VERIFIED WITH: KARA CAMPBELL AT 2230 03/30/17.PMH   Body fluid culture     Status: None   Collection Time: 04/02/17 11:50 AM  Result Value Ref Range Status   Specimen Description PLEURAL  Final   Special Requests NONE  Final   Gram Stain NO WBC SEEN NO ORGANISMS SEEN   Final   Culture   Final    NO GROWTH 3 DAYS Performed at [facility], [address] [place], [place] [address]    Report Status 04/05/2017 FINAL  Final  Acid Fast Smear (AFB)     Status: None   Collection Time: 04/02/17 11:50 AM  Result Value Ref Range Status   AFB Specimen Processing Concentration  Final   Acid Fast Smear Negative  Final    Comment: (NOTE) Performed  At: [facility] [address] [place], [place] [id number] Mila Homer MD [id number]    Source (AFB) PLEURAL  Final    Coagulation Studies: No results for input(s): LABPROT, INR in the last 72 hours.  Urinalysis: No results for input(s): COLORURINE, LABSPEC, PHURINE, GLUCOSEU, HGBUR, BILIRUBINUR, KETONESUR, PROTEINUR, UROBILINOGEN, NITRITE, LEUKOCYTESUR in the last 72 hours.  Invalid input(s): APPERANCEUR    Imaging: Dg Chest 1 View  Result Date: 04/09/2017 CLINICAL DATA:  Status post left thoracentesis EXAM: CHEST 1 VIEW COMPARISON:  04/06/2017 chest radiograph. FINDINGS: Right internal jugular central venous catheter terminates over the right atrium, unchanged. Stable cardiomediastinal silhouette with borderline enlarged cardiopericardial silhouette. No pneumothorax. Small left pleural effusion, significantly decreased. Stable small right pleural effusion. Low lung volumes. Mild pulmonary edema, asymmetric to the right, not appreciably changed. Mild bibasilar atelectasis, decreased on the left and stable on the right. IMPRESSION: 1. No pneumothorax. 2. Small bilateral pleural effusions, right greater the left, significantly decreased on the left and stable on the right . 3. Mild pulmonary edema, asymmetric to the right, stable. 4. Mild bibasilar atelectasis, decreased on the left and stable on the right. Electronically Signed   By: Delbert Phenix M.D.   On: 04/09/2017 14:02   US Pelvis Limited  Result Date: 04/08/2017 CLINICAL DATA:  Urinary retention. EXAM: LIMITED ULTRASOUND OF PELVIS TECHNIQUE: Limited transabdominal ultrasound examination of the pelvis was performed to evaluate the urinary bladder. COMPARISON:  None. FINDINGS: Urinary bladder is nondilated, with calculated bladder volume of 134 mL. Poorly defined intraluminal echogenic area is seen, which may be due to artifact, blood clot, or mass. IMPRESSION: Nondilated urinary bladder. Intraluminal echogenic area which  may be due to artifact, blood clot, or mass . Recommend follow-up ultrasound with full urinary bladder, or CT for further evaluation. Electronically Signed   By: Myles Rosenthal M.D.   On: 04/08/2017 12:31   US Abdomen Limited  Result Date: 04/08/2017 CLINICAL DATA:  Abdominal distention. EXAM: LIMITED ABDOMEN ULTRASOUND FOR ASCITES TECHNIQUE: Limited ultrasound survey for ascites was performed in all four abdominal quadrants. COMPARISON:  None. FINDINGS: Large amount of ascites is seen in all 4 abdominal quadrants. IMPRESSION: Large amount of ascites. Electronically Signed   By: Myles Rosenthal M.D.   On: 04/08/2017 12:32   US Thoracentesis Asp Pleural Space W/img Guide  Result Date: 04/09/2017 CLINICAL DATA:  Recurrent left pleural effusion and respiratory failure. EXAM: ULTRASOUND GUIDED LEFT THORACENTESIS COMPARISON:  04/02/2017 PROCEDURE: An ultrasound guided thoracentesis was thoroughly discussed with the patient and questions answered. The benefits, risks, alternatives and complications were also discussed. The patient understands and wishes to proceed with the procedure. Written consent was obtained. A time-out  was performed prior to performing the procedure. Ultrasound was performed to localize and mark an adequate pocket of fluid in the left chest. The area was then prepped and draped in the normal sterile fashion. 1% Lidocaine was used for local anesthesia. Under ultrasound guidance a 6 French Safe-T-Centesis catheter was introduced. Thoracentesis was performed. The catheter was removed and a dressing applied. COMPLICATIONS: None FINDINGS: A total of approximately 2 L of clear, yellow fluid was removed. IMPRESSION: Successful ultrasound guided left thoracentesis yielding 2 L of pleural fluid. Electronically Signed   By: Irish Lack M.D.   On: 04/09/2017 12:54     Medications:   . albumin human    . dexmedetomidine (PRECEDEX) IV infusion Stopped (04/08/17 1338)   . amLODipine  5 mg Oral Daily   . bisoprolol  10 mg Oral Daily  . buPROPion  100 mg Oral BID  . collagenase   Topical Daily  . docusate sodium  100 mg Oral BID  . feeding supplement (NEPRO CARB STEADY)  237 mL Oral TID BM  . ferrous sulfate  325 mg Oral Q1200  . irbesartan  300 mg Oral QHS  . levothyroxine  75 mcg Oral QAC breakfast  . multivitamin  1 tablet Oral QHS   acetaminophen **OR** [DISCONTINUED] acetaminophen, albumin human, hydrALAZINE, ipratropium-albuterol, lip balm, LORazepam, morphine injection, [DISCONTINUED] ondansetron **OR** ondansetron (ZOFRAN) IV, traMADol  Assessment/ Plan:  [name] is a 36 y.o. white male with hypertension, morbid obesity, pulmonary hypertension, hypothyroidism, hyperlipidemia, depression/anxiety, coronary artery disease, congestive heart failure, who was admitted to [facility] on 03/30/2017  1. Acute renal failure on chronic kidney disease stage III with nephrotic range proteinuria, hematuria and glucosuria No baseline creatinine available. Creatinine of 1.3 in 12/2014 (care everywhere).  Concern for progression of chronic kidney disease to CKD stage V.  Patient has Nephrotic syndrome of unknown etiology. He states he has been dealing with edema for over 2-3 yeas but did not seek medical care. Renal ultrasound report reviewed. HIV negative Not a candidate for kidney biopsy due to body habitus/ in ability to lie prone/ current infection  - 1st HD on 8/20; PPD has been placed on 8/20 -  Patient due for hemodialysis today. Orders have been prepared. Ultrafiltration target 2-3 kg. Albumin has been provided for blood pressure support.  2. Anasarca, with HTN. Echocardiogram with normal systolic function 8/18. No visualization of hepatic cirrhosis on CT abd/pelvis -  As above ultrafiltration target today is 2-3 kg.   3. Hyperphosphatemia.   PTH 60  -  Repeat serum phosphorus today.  4. Anemia of chronic kidney disease: last hemoglobin 8.3 -  No new hemoglobin today.  5.  Generalized weakness with muscle wasting Neurology evaluation in progress - hold statin - Consider rheumatology evaluation as well.  6. B/l pleural effusion -  Thoracentesis performed 04/09/2017. Patient feeling much better today.   LOS: 11 Charleene Callegari 8/28/20189:50 AM

## 2017-04-11 ENCOUNTER — Inpatient Hospital Stay: Payer: Medicaid Other

## 2017-04-11 LAB — BASIC METABOLIC PANEL
ANION GAP: 9 (ref 5–15)
BUN: 53 mg/dL — ABNORMAL HIGH (ref 6–20)
CALCIUM: 7.2 mg/dL — AB (ref 8.9–10.3)
CHLORIDE: 102 mmol/L (ref 101–111)
CO2: 31 mmol/L (ref 22–32)
CREATININE: 4.28 mg/dL — AB (ref 0.61–1.24)
GFR calc non Af Amer: 17 mL/min — ABNORMAL LOW (ref >60)
GFR, EST AFRICAN AMERICAN: 19 mL/min — AB (ref >60)
Glucose, Bld: 97 mg/dL (ref 65–99)
Potassium: 3.7 mmol/L (ref 3.5–5.1)
SODIUM: 142 mmol/L (ref 135–145)

## 2017-04-11 LAB — CBC
HEMATOCRIT: 21.8 % — AB (ref 40.0–52.0)
HEMOGLOBIN: 6.8 g/dL — AB (ref 13.0–18.0)
MCH: 25.8 pg — ABNORMAL LOW (ref 26.0–34.0)
MCHC: 31 g/dL — AB (ref 32.0–36.0)
MCV: 83.1 fL (ref 80.0–100.0)
Platelets: 358 10*3/uL (ref 150–440)
RBC: 2.62 MIL/uL — ABNORMAL LOW (ref 4.40–5.90)
RDW: 15.2 % — ABNORMAL HIGH (ref 11.5–14.5)
WBC: 11.3 10*3/uL — AB (ref 3.8–10.6)

## 2017-04-11 LAB — PROTIME-INR
INR: 1.14
Prothrombin Time: 14.5 seconds (ref 11.4–15.2)

## 2017-04-11 LAB — PREPARE RBC (CROSSMATCH)

## 2017-04-11 LAB — CK: CK TOTAL: 32 U/L — AB (ref 49–397)

## 2017-04-11 LAB — PHOSPHORUS: PHOSPHORUS: 5.3 mg/dL — AB (ref 2.5–4.6)

## 2017-04-11 LAB — APTT: APTT: 40 s — AB (ref 24–36)

## 2017-04-11 LAB — MAGNESIUM: MAGNESIUM: 1.8 mg/dL (ref 1.7–2.4)

## 2017-04-11 LAB — ABO/RH: ABO/RH(D): B POS

## 2017-04-11 MED ORDER — HEPARIN (PORCINE) IN NACL 100-0.45 UNIT/ML-% IJ SOLN: 16.0000 [IU]/kg/h | INTRAMUSCULAR | Status: DC

## 2017-04-11 MED ORDER — ACETAMINOPHEN 325 MG PO TABS
650.0000 mg | ORAL_TABLET | Freq: Once | ORAL | Status: AC
  Administered 2017-04-11: 650 mg via ORAL
  Filled 2017-04-11: qty 2

## 2017-04-11 MED ORDER — HEPARIN (PORCINE) IN NACL 100-0.45 UNIT/ML-% IJ SOLN
16.0000 [IU]/kg/h | INTRAMUSCULAR | Status: DC
  Administered 2017-04-11: 16 [IU]/kg/h via INTRAVENOUS
  Filled 2017-04-11: qty 250

## 2017-04-11 MED ORDER — SODIUM CHLORIDE 0.9 % IV SOLN
Freq: Once | INTRAVENOUS | Status: AC
  Administered 2017-04-11: 10:00:00 via INTRAVENOUS

## 2017-04-11 NOTE — Progress Notes (Addendum)
[employer] [facility] [facility] [place] [facility]   PATIENT NAME: Brian FlackJoseph [name]    MR#:  [id number]  DATE OF BIRTH:  1981-03-26  SUBJECTIVE:     The patient alert and awake, Complains of generalized weakness and mild shortness breath,  off BIPAP, on O2 [place] 4L. Looks pale.  REVIEW OF SYSTEMS:    Review of Systems  Constitutional: Negative for fever, chills weight loss  Positive generalized weakness  HENT: Negative for ear pain, nosebleeds, congestion, facial swelling, rhinorrhea, neck pain, neck stiffness and ear discharge.   Respiratory: Negative for cough, mild shortness of breath, no wheezing  Cardiovascular: Negative for chest pain, palpitations and better leg swelling.  Gastrointestinal: Negative for heartburn, abdominal pain, vomiting, diarrhea or consitpation Genitourinary: Negative for dysuria, urgency, frequency, hematuria Musculoskeletal: Negative for back pain or joint pain Neurological: Negative for dizziness, seizures, syncope, focal weakness,  numbness and headaches.  Hematological: Does not bruise/bleed easily.  Psychiatric/Behavioral: Negative for hallucinations, confusion, dysphoric mood Skin: no rash. DRUG ALLERGIES:  No Known Allergies  VITALS:  Blood pressure (!) 158/87, pulse 76, temperature 97.8 F (36.6 C), temperature source Oral, resp. rate 20, height 5\' 6"  (1.676 m), weight (!) 334 lb (151.5 kg), SpO2 100 %.  PHYSICAL EXAMINATION:  Constitutional: Appears obese anasarca. NAD. Morbid obesity. onBIPAP. HENT: Normocephalic. [name]. Oropharynx is clear and moist.  Eyes: Conjunctivae and EOM are normal. PERRLA, no scleral icterus.  Neck: Normal ROM. Neck supple. No JVD. No tracheal deviation. CVS: RRR, S1/S2 +, no murmurs, no gallops, no carotid bruit.  Pulmonary: Decreased breath sounds with scant bilateral wheezing at bases Abdominal: better abdominal wall edema, bowel sounds present. No tenderness or distention. Due to body habitus unable to appreciate for  organomegaly. Musculoskeletal:  Much better bilateral leg edema, no cyanosis, no tenderness.   Neuro: Alert. CN 2-12 grossly intact. No focal deficits. Power 2/5 in lower extremities and 4/5 in upper extremities.  Skin:left thigh covered wound   Right femoral dialysis catheter placed Psychiatric:alert and awake. LABORATORY PANEL:   CBC  Recent Labs Lab 04/11/17 0500  WBC 11.3*  HGB 6.8*  HCT 21.8*  PLT 358   ------------------------------------------------------------------------------------------------------------------  Chemistries   Recent Labs Lab 04/11/17 0500  NA 142  K 3.7  CL 102  CO2 31  GLUCOSE 97  BUN 53*  CREATININE 4.28*  CALCIUM 7.2*  MG 1.8   ------------------------------------------------------------------------------------------------------------------  Cardiac Enzymes No results for input(s): TROPONINI in the last 168 hours. ------------------------------------------------------------------------------------------------------------------  RADIOLOGY:  Dg Chest 1 View  Result Date: 04/09/2017 CLINICAL DATA:  Status post left thoracentesis EXAM: CHEST 1 VIEW COMPARISON:  04/06/2017 chest radiograph. FINDINGS: Right internal jugular central venous catheter terminates over the right atrium, unchanged. Stable cardiomediastinal silhouette with borderline enlarged cardiopericardial silhouette. No pneumothorax. Small left pleural effusion, significantly decreased. Stable small right pleural effusion. Low lung volumes. Mild pulmonary edema, asymmetric to the right, not appreciably changed. Mild bibasilar atelectasis, decreased on the left and stable on the right. IMPRESSION: 1. No pneumothorax. 2. Small bilateral pleural effusions, right greater the left, significantly decreased on the left and stable on the right . 3. Mild pulmonary edema, asymmetric to the right, stable. 4. Mild bibasilar atelectasis, decreased on the left and stable on the right.  Electronically Signed   By: Delbert PhenixJason A Poff M.D.   On: 04/09/2017 14:02   [place] Paracentesis  Result Date: 04/11/2017 INDICATION: Large volume ascites EXAM: ULTRASOUND GUIDED  PARACENTESIS MEDICATIONS: None. COMPLICATIONS: None immediate. PROCEDURE: Informed written consent was obtained from  the patient after a discussion of the risks, benefits and alternatives to treatment. A timeout was performed prior to the initiation of the procedure. Initial ultrasound scanning demonstrates a large amount of ascites within the right abdomen. The right abdomen was prepped and draped in the usual sterile fashion. 1% lidocaine was used for local anesthesia. Following this, a 6 Fr Safe-T-Centesis catheter was introduced. An ultrasound image was saved for documentation purposes. The paracentesis was performed. The catheter was removed and a dressing was applied. The patient tolerated the procedure well without immediate post procedural complication. FINDINGS: A total of approximately 5 L of clear yellow fluid was removed. Samples were sent to the laboratory as requested by the clinical team. IMPRESSION: Successful ultrasound-guided paracentesis yielding 5 liters of peritoneal fluid. Electronically Signed   By: Alcide Clever M.D.   On: 04/11/2017 12:48     ASSESSMENT AND PLAN:   36 year old male with history of chronic diastolic heart failure, primary pulmonary hypertension and hypothyroidism with chronic kidney disease stage III who presents for [facility] due to shortness of breath.  1. Acute on chronic hypoxic respiratory failure with hypoxia in the setting of fluid overload as well as small pneumothorax After thoracentesis Patient titrated off of BiPAP and on nasal cannula 4L. Pneumothorax has resolved  Pulmonologist recommended an chest x-ray in one week which would be this Wednesday. He was put on BiPAP again due to hypercapnia. NEB. Try to wean down  O2 [place]. Continue NEB.  BIPAP prn.  Obesity  Hypoventilation Syndrome. BIPAP prn.  * Bilateral moderate to large pleural effusion. S/p US guided thoracentesis. 2 liters of clear, yellowish fluid removed. CXR: Small bilateral pleural effusions, right greater the left, significantly decreased on the left and stable on the right.  2 Acute on chronic kidney failure Stage III:  progression of chronic kidney disease to chronic kidney disease stage V Nephrology consult appreciated. Renal ultrasound showed no hydronephrosis Patient Has started dialysis which was initiated August 20. Not a candidate for kidney biopsy due to body habitus He is treated wtih IV albumin during HD. Per Dr. Cherylann Ratel, therefore no urgent indication for dialysis today. If his urine output continues to increase he may be able to come off of dialysis.  * Ascites. Large amount of ascites per [place].  S/P US guided paracentesis this am (August 29), yielding 5 liters of peritoneal fluid.. S/p U/s guideded paracentesis status post 2.8 L of fluid on August 20.  3.  Acute on chronic diastolic heart failure and pulmonary hypertension and anasarca Continue hemodialysis  Echocardiogram shows normal ejection fraction without major valvular abnormalities. Per Dr. Cherylann Ratel, therefore no urgent indication for dialysis today.  4. Left thigh wound:  Status post bedside debridement by surgeon. MULTIPLE ORGANISMS PRESENT, NONE PREDOMINANT. NO ANAEROBES ISOLATED per wound cultures.  On vancomycin and cefepime and stopped antibiotics on August 25. Pressure offloading Daily dressing changes with Santyl.  4. Accelerated Essential hypertension: Continue irbesartan and Bisoprolol, resumed Clonidine due to accelerated hypertension, but discontinued since SBP goal 130-150 to allow for adequate fluid removal with HD per Dr. Thedore Mins.  5. Hypothyroid: Continue Synthroid 6. Hyperlipidemia: Continue statin 7. 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 Hemoglobin decreased to 6.8. He is getting 1 unit PRBC transfusion, follow-up hemoglobin in a.m.  Generalized weakness with muscle wasting Hold statin. Per Dr. Thad Ranger, may need EMG as outpatient. Rheumatology consult. Muscle  biopsy per Dr. Gavin Potters. Need hold Eliquis for 3-4 days and start heparin drip.  Discussed with  Dr. Gavin Potters and Dr. Cherylann Ratel. Physical therapy is recommending the patient return back to skilled nursing facility once stable for discharge Management plans discussed with the patient and he is in agreement.  CODE STATUS: full  TOTAL TIME TAKING CARE OF THIS PATIENT: 48 minutes.   POSSIBLE D/C >3  days, DEPENDING ON CLINICAL CONDITION.    Shaune Pollack M.D on 04/11/2017 at 1:45 PM  Between 7am to 6pm - Pager - [phone] After 6pm go to [email or web address] - password [email or web address]  [facility]  Office  [phone]  CC: Primary care physician; Patient, No Pcp Per  Note: This dictation was prepared with Dragon dictation along with smaller phrase technology. Any transcriptional errors that result from this process are unintentional.

## 2017-04-11 NOTE — Progress Notes (Addendum)
Pt. was unable to sit up to chair today for three hours because he went to a paracentesis this am. When he came back to the floor, blood transfusion has to be given an lasted three hours. Pt. is in agreement to sit up  in the chair tomorrow either before  or after dialysis.[name]. He understood the importance of sitting in the chair before discharge.

## 2017-04-11 NOTE — Progress Notes (Signed)
[facility]  ROUNDING NOTE   Subjective:  Patient transferred back to floor care yesterday. Urine output did increase yesterday to 1.1 L. He also has completed hemodialysis yesterday. Hemoglobin down to 6.8 today and patient is to receive 1 unit PRBC transfusion.   Objective:  Vital signs in last 24 hours:  Temp:  [97.7 F (36.5 C)-98.2 F (36.8 C)] 97.7 F (36.5 C) (08/29 0517) Pulse Rate:  [58-72] 72 (08/29 0517) Resp:  [10-19] 19 (08/29 0517) BP: (133-155)/(58-96) 138/81 (08/29 0517) SpO2:  [99 %-100 %] 100 % (08/29 0517)  Weight change:  Filed Weights   04/07/17 0800 04/07/17 0930 04/07/17 1230  Weight: (!) 147 kg (324 lb 1.2 oz) (!) 154.5 kg (340 lb 9.8 oz) (!) 151.5 kg (334 lb)    Intake/Output: I/O last 3 completed shifts: In: 817 [P.O.:240; NG/GT:477; IV Piggyback:100] Out: 4600 [Urine:1600; Other:3000]   Intake/Output this shift:  Total I/O In: -  Out: 300 [Urine:300]  Physical Exam: General: Chronically ill-appearing, laying in bed  Head: +temporal wasting  Eyes: Anicteric  Neck: supple  Lungs:  Diminished bilaterally, Normal effort   Heart: S1S2 no rubs  Abdomen:  Soft, nontender, obese  Extremities: 2+ b/l LE edema  Neurologic: significant generalized muscle wasting  Skin: Prurigo lesions over legs and thighs, dressing on thighs  Access: Rt IJ temp cath    Basic Metabolic Panel:  Recent Labs Lab 04/05/17 0348 04/06/17 0402 04/07/17 0504 04/10/17 1009 04/11/17 0500  NA 142 143 139  --  142  K 4.3 4.4 4.5  --  3.7  CL 106 106 103  --  102  CO2 25 26 24   --  31  GLUCOSE 84 91 83  --  97  BUN 96* 81* 87*  --  53*  CREATININE 6.28* 5.80* 6.09*  --  4.28*  CALCIUM 7.1* 7.4* 7.3*  --  7.2*  MG  --   --   --   --  1.8  PHOS  --   --   --  6.5* 5.3*    Liver Function Tests: No results for input(s): AST, ALT, ALKPHOS, BILITOT, PROT, ALBUMIN in the last 168 hours. No results for input(s): LIPASE, AMYLASE in the last 168  hours. No results for input(s): AMMONIA in the last 168 hours.  CBC:  Recent Labs Lab 04/06/17 0402 04/07/17 0504 04/11/17 0500  WBC 14.3* 12.3* 11.3*  HGB 8.6* 8.3* 6.8*  HCT 27.7* 26.4* 21.8*  MCV 81.6 83.5 83.1  PLT 484* 432 358    Cardiac Enzymes:  Recent Labs Lab 04/05/17 2334  CKMB 6.8*    BNP: Invalid input(s): POCBNP  CBG:  Recent Labs Lab 04/06/17 2201 04/06/17 2246  GLUCAP 98 95    Microbiology: Results for orders placed or performed during the hospital encounter of 03/30/17  Aerobic/Anaerobic Culture (surgical/deep wound)     Status: Abnormal   Collection Time: 03/30/17  7:56 PM  Result Value Ref Range Status   Specimen Description WOUND LEFT THIGH  Final   Special Requests NONE  Final   Gram Stain   Final    ABUNDANT WBC PRESENT, PREDOMINANTLY PMN ABUNDANT GRAM POSITIVE COCCI ABUNDANT GRAM NEGATIVE RODS MODERATE GRAM POSITIVE RODS MODERATE GRAM NEGATIVE COCCI    Culture (A)  Final    MULTIPLE ORGANISMS PRESENT, NONE PREDOMINANT NO ANAEROBES ISOLATED Performed at [facility], [address], [place], [place] [address]    Report Status 04/05/2017 FINAL  Final  MRSA PCR Screening  Status: Abnormal   Collection Time: 03/30/17  8:58 PM  Result Value Ref Range Status   MRSA by PCR POSITIVE (A) NEGATIVE Final    Comment:        The GeneXpert MRSA Assay (FDA approved for NASAL specimens only), is one component of a comprehensive MRSA colonization surveillance program. It is not intended to diagnose MRSA infection nor to guide or monitor treatment for MRSA infections. RESULT CALLED TO, READ BACK BY AND VERIFIED WITH: KARA CAMPBELL AT 2230 03/30/17.PMH   Body fluid culture     Status: None   Collection Time: 04/02/17 11:50 AM  Result Value Ref Range Status   Specimen Description PLEURAL  Final   Special Requests NONE  Final   Gram Stain NO WBC SEEN NO ORGANISMS SEEN   Final   Culture   Final    NO GROWTH 3 DAYS Performed  at [facility], [address], [place], [place] [address]    Report Status 04/05/2017 FINAL  Final  Acid Fast Smear (AFB)     Status: None   Collection Time: 04/02/17 11:50 AM  Result Value Ref Range Status   AFB Specimen Processing Concentration  Final   Acid Fast Smear Negative  Final    Comment: (NOTE) Performed At: [facility] [address] [place], [place] [id number] Mila Homer MD XB:[id number]    Source (AFB) PLEURAL  Final    Coagulation Studies: No results for input(s): LABPROT, INR in the last 72 hours.  Urinalysis: No results for input(s): COLORURINE, LABSPEC, PHURINE, GLUCOSEU, HGBUR, BILIRUBINUR, KETONESUR, PROTEINUR, UROBILINOGEN, NITRITE, LEUKOCYTESUR in the last 72 hours.  Invalid input(s): APPERANCEUR    Imaging: Dg Chest 1 View  Result Date: 04/09/2017 CLINICAL DATA:  Status post left thoracentesis EXAM: CHEST 1 VIEW COMPARISON:  04/06/2017 chest radiograph. FINDINGS: Right internal jugular central venous catheter terminates over the right atrium, unchanged. Stable cardiomediastinal silhouette with borderline enlarged cardiopericardial silhouette. No pneumothorax. Small left pleural effusion, significantly decreased. Stable small right pleural effusion. Low lung volumes. Mild pulmonary edema, asymmetric to the right, not appreciably changed. Mild bibasilar atelectasis, decreased on the left and stable on the right. IMPRESSION: 1. No pneumothorax. 2. Small bilateral pleural effusions, right greater the left, significantly decreased on the left and stable on the right . 3. Mild pulmonary edema, asymmetric to the right, stable. 4. Mild bibasilar atelectasis, decreased on the left and stable on the right. Electronically Signed   By: Delbert Phenix M.D.   On: 04/09/2017 14:02   US Thoracentesis Asp Pleural Space W/img Guide  Result Date: 04/09/2017 CLINICAL DATA:  Recurrent left pleural effusion and respiratory failure. EXAM: ULTRASOUND GUIDED LEFT  THORACENTESIS COMPARISON:  04/02/2017 PROCEDURE: An ultrasound guided thoracentesis was thoroughly discussed with the patient and questions answered. The benefits, risks, alternatives and complications were also discussed. The patient understands and wishes to proceed with the procedure. Written consent was obtained. A time-out was performed prior to performing the procedure. Ultrasound was performed to localize and mark an adequate pocket of fluid in the left chest. The area was then prepped and draped in the normal sterile fashion. 1% Lidocaine was used for local anesthesia. Under ultrasound guidance a 6 French Safe-T-Centesis catheter was introduced. Thoracentesis was performed. The catheter was removed and a dressing applied. COMPLICATIONS: None FINDINGS: A total of approximately 2 L of clear, yellow fluid was removed. IMPRESSION: Successful ultrasound guided left thoracentesis yielding 2 L of pleural fluid. Electronically Signed   By: Sherrine Maples  Fredia Sorrow M.D.   On: 04/09/2017 12:54     Medications:   . albumin human     . amLODipine  5 mg Oral Daily  . apixaban  5 mg Oral BID  . bisoprolol  10 mg Oral Daily  . buPROPion  100 mg Oral BID  . collagenase   Topical Daily  . docusate sodium  100 mg Oral BID  . feeding supplement (NEPRO CARB STEADY)  237 mL Oral TID BM  . ferrous sulfate  325 mg Oral Q1200  . irbesartan  300 mg Oral QHS  . levothyroxine  75 mcg Oral QAC breakfast  . multivitamin  1 tablet Oral QHS   acetaminophen **OR** [DISCONTINUED] acetaminophen, albumin human, hydrALAZINE, ipratropium-albuterol, lip balm, LORazepam, morphine injection, [DISCONTINUED] ondansetron **OR** ondansetron (ZOFRAN) IV, traMADol  Assessment/ Plan:  [name] is a 36 y.o. white male with hypertension, morbid obesity, pulmonary hypertension, hypothyroidism, hyperlipidemia, depression/anxiety, coronary artery disease, congestive heart failure, who was admitted to [facility] on 03/30/2017  1. Acute renal  failure on chronic kidney disease stage III with nephrotic range proteinuria, hematuria and glucosuria No baseline creatinine available. Creatinine of 1.3 in 12/2014 (care everywhere).  Concern for progression of chronic kidney disease to CKD stage V.  Patient has Nephrotic syndrome of unknown etiology. He states he has been dealing with edema for over 2-3 yeas but did not seek medical care. Renal ultrasound report reviewed. HIV negative Not a candidate for kidney biopsy due to body habitus/ in ability to lie prone/ current infection  - 1st HD on 8/20; PPD has been placed on 8/20 -  Urine output did increase yesterday to 1.1 L. He also had dialysis yesterday. Therefore no urgent indication for dialysis today. If his urine output continues to increase he may be able to come off of dialysis.  2. Anasarca, with HTN. Echocardiogram with normal systolic function 8/18. No visualization of hepatic cirrhosis on CT abd/pelvis -  Patient had good ultrafiltration with dialysis yesterday achieving 3 kg..   3. Hyperphosphatemia.   PTH 60  - Serum phosphorus was down to 5.3 yesterday. Continue to monitor serum phosphorus periodically.  4. Anemia of chronic kidney disease: Hemoglobin down to 6.8 today. Patient receiving PRBC transfusion times one unit.  5. Generalized weakness with muscle wasting Neurology evaluation in progress - hold statin - Consider rheumatology evaluation as well.  6. B/l pleural effusion -  Thoracentesis performed 04/09/2017.    LOS: 12 [name] 8/29/201810:36 AM

## 2017-04-11 NOTE — Procedures (Signed)
US paracentesis without difficulty  Complications:  None  Blood Loss: none  See dictation in canopy pacs  

## 2017-04-11 NOTE — Consult Note (Signed)
Reason for Consult: Muscle weakness  Referring Physician: Dr. Imogene Burnhen. Hospitalist.  [name]   HPI: 36 year old white male. Complicated story History of massive morbid obesity. Weight got up to 700 pounds. 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 2-3 years ago he developed leg swelling. He thought it started with insect stings. Would also developed some bumps on his legs. Saw a cardiologist in 2016. Was placed on 2 diuretics. Echocardiogram was unremarkable. Last year he became weaker. Had lost the ability to lift boxes at his work. Had more difficulty walking. Progress to the point where he could not get off the toilet without assistance. Had some weakness in his hands. Occasionally have difficulty swallowing. He was placed in a nursing home. He said while the home he lost from 700 down to 350 pounds. Up to about a month ago was able to walk or sit with assistance with PT but not independently. He developed skin lesions on his legs. He felt that they start with a purple dot in any my scratch them. The left leg has developed a wound. Occasion was difficulty swallowing and choking. Presented with respiratory failure and anasarca. Was found to have creatinine 6. Hemoglobin down to 6.8. Was acidotic 7.16 with PCO2 74 He had thoracentesis pleural effusions as well as paracentesis with large drainage. D-dimer was elevated. 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 As noted to have profound muscle weakness and intrinsic and forearm weakness of the hands. Ferritin 438. TSH greater than 11. Sedimentation rate greater than 140. Urinalysis had proteinuria. Myasthenia antibodies negative.  PMH: Morbid obesity. Renal insufficiency. Edema. Profound myopathy.  SURGICAL HISTORY: Noncontributory  Family History: No family history of muscle disease or muscular dystrophy  Social History: No cigarettes or alcohol  Allergies: No Known Allergies  Medications:  Continuous: . albumin human    . heparin           ROS; No Raynaud's. No photosensitive rash. No abdominal pain.   PHYSICAL EXAM: Blood pressure (!) 169/97, pulse 69, temperature 97.9 F (36.6 C), temperature source Oral, resp. rate 20, height 5\' 6"  (1.676 m), weight (!) 151.5 kg (334 lb), SpO2 100 %. Ill-appearing male. Temporal wasting. Oropharynx clear. Pale appearing. Pale sclera. Soft systolic murmur. Clear chest. Diffuse anasarca his lower extremities. Has purpura. Hyperpigmented lesions. Stasis changes. Ulcer on the lateral right leg as well as the mid left thigh. Being debrided. Musculoskeletal hypertrophic changes both knees. Hands without synovitis. Shoulders moved well. Hips move reasonably. No inflammatory synovitis Neurologic: Temporal wasting. Thenar atrophy. Loss of triceps and biceps. Neck flexors are 5 minus over 5. Triceps are 3-4/5. Grip is illness 5 minus over 5 bilaterally. He has weakness of his thumb extensors but reasonable DIP flexors. He has difficulty raising his leg off the bed and C anchors his heel. 3/5. Plantar and dorsiflexors are 5 over 5. He has biceps triceps and ankle jerks. Normal sensation in the feet.  Assessment: Profound muscle atrophy with weakness. Proximal but also in the hands and the thumb extensors. Raises the question of inclusion body myositis versus other etiology. 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 Even with a significant weight loss  Renal insufficiency and failure. Unsure over what duration. Creatinine previously was 1.7,1.3 in 2016. Anasarca. Proteinuria. May have been present long-standing   Morbid obesity with recent weight loss  Mild hypothyroid  Skin  lesions.  Purpura Then ulcerates. Cannot rule out vasculitis or pyoderma   Recommendations: May be best to try to get as much information about his muscle disease in his kidney disease while he is  hospitalized. Consider  holding his anticoagulation, converting him to heparin and reconsidering muscle biopsy. The deltoid would be a reasonable muscle and is not currently swollen/edematous. It would need to be sent to [facility] with proper staining especially electron microscopy  Ideally he would get nerve conduction studies but may not be able to be arranged during the hospitalization. Would also need muscle antibodies which are not in the hospital system  Ideally he would get renal biopsy if his anticoagulation is held. Best to define his proteinuria/renal failure. Rule out membranous etc.  Consider skin biopsy to rule out vasculitis if his anticoagulation is held.  Evaluation for dysphasia. Could be muscle involvement of his primary muscle disease. Barium swallow versus speech eval   Kandyce Rud 04/11/2017, 4:30 PM

## 2017-04-11 NOTE — Progress Notes (Addendum)
ANTICOAGULATION CONSULT NOTE - Initial Consult  Pharmacy Consult for heparin Indication: vte treatment  No Known Allergies  Patient Measurements: Height: 5\' 6"  (167.6 cm) Weight:  (bedscale broken) IBW/kg (Calculated) : 63.8 Heparin Dosing Weight: 101  Vital Signs: Temp: 97.9 F (36.6 C) (08/29 1616) Temp Source: Oral (08/29 1616) BP: 169/97 (08/29 1255) Pulse Rate: 69 (08/29 1616)  Labs:  Recent Labs  04/11/17 0500  HGB 6.8*  HCT 21.8*  PLT 358  CREATININE 4.28*    Estimated Creatinine Clearance: 34 mL/min (A) (by C-G formula based on SCr of 4.28 mg/dL (H)).   Medical History: Past Medical History:  Diagnosis Date  . CKD (chronic kidney disease)   . Depression   . Diastolic CHF (HCC)   . Generalized anxiety disorder   . Hyperlipidemia   . Hypertension   . Hypertension   . Hypothyroidism   . Lymphedema   . Obesity   . Pulmonary hypertension (HCC)     Medications:  Scheduled:  . amLODipine  5 mg Oral Daily  . bisoprolol  10 mg Oral Daily  . buPROPion  100 mg Oral BID  . collagenase   Topical Daily  . docusate sodium  100 mg Oral BID  . feeding supplement (NEPRO CARB STEADY)  237 mL Oral TID BM  . ferrous sulfate  325 mg Oral Q1200  . irbesartan  300 mg Oral QHS  . levothyroxine  75 mcg Oral QAC breakfast  . multivitamin  1 tablet Oral QHS   Infusions:  . albumin human    . 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 Starting gtt at 2200 because am dose of apixaban was given at 1000.  Will need to follow aptt until levels correlate.  Goal of Therapy:  Heparin level 0.3-0.7 units/ml Monitor platelets by anticoagulation protocol: Yes   Plan:    Start heparin infusion at 1600 units/hr Check anti-Xa level in 6 hours and daily while on heparin Continue to monitor H&H and platelets   Did speak via phone with Dr Imogene Burnhen prior to initiation of heparin because of patient anemia at hgb 6.8. He determined to proceed no bolus of heparin.  Nishan Ovens 04/11/2017,4:37 PM

## 2017-04-11 NOTE — Care Management (Signed)
Confirmed with [facility] liaison that Medicaid will not pay outpatient HD as long as it is classified AKI.  Spoke with Alcario Drought at [facility] they are not able to accept Medicaid patients.  Also spoke with Candace at [facility].  Candace states that she believes "they do not have availably for a Medicaid bed".  Awaiting confirmation prior to pursuing LTACH.

## 2017-04-12 LAB — TYPE AND SCREEN
ABO/RH(D): B POS
Antibody Screen: NEGATIVE
Unit division: 0

## 2017-04-12 LAB — CBC
HCT: 27.9 % — ABNORMAL LOW (ref 40.0–52.0)
Hemoglobin: 8.9 g/dL — ABNORMAL LOW (ref 13.0–18.0)
MCH: 26.4 pg (ref 26.0–34.0)
MCHC: 31.8 g/dL — ABNORMAL LOW (ref 32.0–36.0)
MCV: 82.9 fL (ref 80.0–100.0)
Platelets: 393 10*3/uL (ref 150–440)
RBC: 3.36 MIL/uL — ABNORMAL LOW (ref 4.40–5.90)
RDW: 15.4 % — ABNORMAL HIGH (ref 11.5–14.5)
WBC: 12.3 10*3/uL — ABNORMAL HIGH (ref 3.8–10.6)

## 2017-04-12 LAB — HEPARIN LEVEL (UNFRACTIONATED)
HEPARIN UNFRACTIONATED: 0.8 [IU]/mL — AB (ref 0.30–0.70)
Heparin Unfractionated: 0.51 IU/mL (ref 0.30–0.70)

## 2017-04-12 LAB — BASIC METABOLIC PANEL
Anion gap: 8 (ref 5–15)
BUN: 58 mg/dL — ABNORMAL HIGH (ref 6–20)
CO2: 31 mmol/L (ref 22–32)
Calcium: 7.5 mg/dL — ABNORMAL LOW (ref 8.9–10.3)
Chloride: 102 mmol/L (ref 101–111)
Creatinine, Ser: 4.74 mg/dL — ABNORMAL HIGH (ref 0.61–1.24)
GFR calc Af Amer: 17 mL/min — ABNORMAL LOW (ref >60)
GFR calc non Af Amer: 15 mL/min — ABNORMAL LOW (ref >60)
Glucose, Bld: 99 mg/dL (ref 65–99)
Potassium: 3.9 mmol/L (ref 3.5–5.1)
Sodium: 141 mmol/L (ref 135–145)

## 2017-04-12 LAB — ALDOLASE: Aldolase: 7.3 U/L (ref 3.3–10.3)

## 2017-04-12 LAB — BPAM RBC
Blood Product Expiration Date: 201809172359
ISSUE DATE / TIME: 201808291226
Unit Type and Rh: 7300

## 2017-04-12 LAB — APTT
APTT: 67 s — AB (ref 24–36)
aPTT: 67 seconds — ABNORMAL HIGH (ref 24–36)

## 2017-04-12 LAB — ANA W/REFLEX IF POSITIVE: Anti Nuclear Antibody(ANA): NEGATIVE

## 2017-04-12 MED ORDER — HEPARIN (PORCINE) IN NACL 100-0.45 UNIT/ML-% IJ SOLN
1800.0000 [IU]/h | INTRAMUSCULAR | Status: DC
  Administered 2017-04-12 (×2): 1700 [IU]/h via INTRAVENOUS
  Administered 2017-04-13: 1800 [IU]/h via INTRAVENOUS
  Filled 2017-04-12 (×2): qty 250

## 2017-04-12 MED ORDER — SODIUM CHLORIDE 0.9 % IV SOLN: INTRAVENOUS | Status: DC

## 2017-04-12 NOTE — Progress Notes (Signed)
ANTICOAGULATION CONSULT NOTE - Initial Consult  Pharmacy Consult for heparin Indication: vte treatment  No Known Allergies  Patient Measurements: Height: 5\' 6"  (167.6 cm) Weight:  (bedscale broken) IBW/kg (Calculated) : 63.8 Heparin Dosing Weight: 101  Vital Signs: Temp: 98.3 F (36.8 C) (08/29 2040) Temp Source: Oral (08/29 2040) BP: 155/90 (08/29 2229) Pulse Rate: 73 (08/29 2229)  Labs:  Recent Labs  04/11/17 0500 04/11/17 1634 04/12/17 0421  HGB 6.8*  --  8.9*  HCT 21.8*  --  27.9*  PLT 358  --  393  APTT  --  40* 67*  LABPROT  --  14.5  --   INR  --  1.14  --   HEPARINUNFRC  --   --  0.80*  CREATININE 4.28*  --  4.74*  CKTOTAL  --  32*  --     Estimated Creatinine Clearance: 30.7 mL/min (A) (by C-G formula based on SCr of 4.74 mg/dL (H)).   Medical History: Past Medical History:  Diagnosis Date  . CKD (chronic kidney disease)   . Depression   . Diastolic CHF (HCC)   . Generalized anxiety disorder   . Hyperlipidemia   . Hypertension   . Hypertension   . Hypothyroidism   . Lymphedema   . Obesity   . Pulmonary hypertension (HCC)     Medications:  Scheduled:  . amLODipine  5 mg Oral Daily  . bisoprolol  10 mg Oral Daily  . buPROPion  100 mg Oral BID  . collagenase   Topical Daily  . docusate sodium  100 mg Oral BID  . feeding supplement (NEPRO CARB STEADY)  237 mL Oral TID BM  . ferrous sulfate  325 mg Oral Q1200  . irbesartan  300 mg Oral QHS  . levothyroxine  75 mcg Oral QAC breakfast  . multivitamin  1 tablet Oral QHS   Infusions:  . albumin human    . 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 Starting gtt at 2200 because am dose  of apixaban was given at 1000.  Will need to follow aptt until levels correlate.  Goal of Therapy:  Heparin level 0.3-0.7 units/ml Monitor platelets by anticoagulation protocol: Yes   Plan:   Start heparin infusion at 1600 units/hr Check anti-Xa level in 6 hours and daily while on heparin Continue to monitor H&H and platelets   Did speak via phone with Dr Imogene Burnhen prior to initiation of heparin because of patient anemia at hgb 6.8. He determined to proceed no bolus of heparin.   8/30 @ 0421 :   APTT = 67,  HL = 0.8 Will increase heparin drip to 1700 units/hr and recheck aPTT 6 hrs after rate change.  Cing Holden D 04/12/2017,5:23 AM

## 2017-04-12 NOTE — Progress Notes (Signed)
[facility]  ROUNDING NOTE   Subjective:  Urine output appears to be increasing. Appreciate rheumatology input. Patient will need deltoid muscle biopsy. We are also considering renal biopsy at this time however coordination of this could be complex.   Objective:  Vital signs in last 24 hours:  Temp:  [97.5 F (36.4 C)-98.3 F (36.8 C)] 97.5 F (36.4 C) (08/30 0733) Pulse Rate:  [50-79] 50 (08/30 0733) Resp:  [18-20] 18 (08/30 0528) BP: (149-169)/(46-97) 162/46 (08/30 0733) SpO2:  [97 %-100 %] 97 % (08/30 0733)  Weight change:  Filed Weights   04/07/17 0800 04/07/17 0930 04/07/17 1230  Weight: (!) 147 kg (324 lb 1.2 oz) (!) 154.5 kg (340 lb 9.8 oz) (!) 151.5 kg (334 lb)    Intake/Output: I/O last 3 completed shifts: In: 737 [I.V.:175; Blood:322; NG/GT:240] Out: 2300 [Urine:2300]   Intake/Output this shift:  Total I/O In: 172 [P.O.:120; I.V.:52] Out: 250 [Urine:250]  Physical Exam: General: Chronically ill-appearing, laying in bed  Head: +temporal wasting  Eyes: Anicteric  Neck: supple  Lungs:  Diminished bilaterally, Normal effort   Heart: S1S2 no rubs  Abdomen:  Soft, nontender, obese  Extremities: 2+ b/l LE edema  Neurologic: significant generalized muscle wasting  Skin: Prurigo lesions over legs and thighs, dressing on thighs  Access: Rt IJ temp cath    Basic Metabolic Panel:  Recent Labs Lab 04/06/17 0402 04/07/17 0504 04/10/17 1009 04/11/17 0500 04/12/17 0421  NA 143 139  --  142 141  K 4.4 4.5  --  3.7 3.9  CL 106 103  --  102 102  CO2 26 24  --  31 31  GLUCOSE 91 83  --  97 99  BUN 81* 87*  --  53* 58*  CREATININE 5.80* 6.09*  --  4.28* 4.74*  CALCIUM 7.4* 7.3*  --  7.2* 7.5*  MG  --   --   --  1.8  --   PHOS  --   --  6.5* 5.3*  --     Liver Function Tests: No results for input(s): AST, ALT, ALKPHOS, BILITOT, PROT, ALBUMIN in the last 168 hours. No results for input(s): LIPASE, AMYLASE in the last 168 hours. No results  for input(s): AMMONIA in the last 168 hours.  CBC:  Recent Labs Lab 04/06/17 0402 04/07/17 0504 04/11/17 0500 04/12/17 0421  WBC 14.3* 12.3* 11.3* 12.3*  HGB 8.6* 8.3* 6.8* 8.9*  HCT 27.7* 26.4* 21.8* 27.9*  MCV 81.6 83.5 83.1 82.9  PLT 484* 432 358 393    Cardiac Enzymes:  Recent Labs Lab 04/05/17 2334 04/11/17 1634  CKTOTAL  --  32*  CKMB 6.8*  --     BNP: Invalid input(s): POCBNP  CBG:  Recent Labs Lab 04/06/17 2201 04/06/17 2246  GLUCAP 98 95    Microbiology: Results for orders placed or performed during the hospital encounter of 03/30/17  Aerobic/Anaerobic Culture (surgical/deep wound)     Status: Abnormal   Collection Time: 03/30/17  7:56 PM  Result Value Ref Range Status   Specimen Description WOUND LEFT THIGH  Final   Special Requests NONE  Final   Gram Stain   Final    ABUNDANT WBC PRESENT, PREDOMINANTLY PMN ABUNDANT GRAM POSITIVE COCCI ABUNDANT GRAM NEGATIVE RODS MODERATE GRAM POSITIVE RODS MODERATE GRAM NEGATIVE COCCI    Culture (A)  Final    MULTIPLE ORGANISMS PRESENT, NONE PREDOMINANT NO ANAEROBES ISOLATED Performed at [facility], [address] [place], [place] [address]  Report Status 04/05/2017 FINAL  Final  MRSA PCR Screening     Status: Abnormal   Collection Time: 03/30/17  8:58 PM  Result Value Ref Range Status   MRSA by PCR POSITIVE (A) NEGATIVE Final    Comment:        The GeneXpert MRSA Assay (FDA approved for NASAL specimens only), is one component of a comprehensive MRSA colonization surveillance program. It is not intended to diagnose MRSA infection nor to guide or monitor treatment for MRSA infections. RESULT CALLED TO, READ BACK BY AND VERIFIED WITH: KARA CAMPBELL AT 2230 03/30/17.PMH   Body fluid culture     Status: None   Collection Time: 04/02/17 11:50 AM  Result Value Ref Range Status   Specimen Description PLEURAL  Final   Special Requests NONE  Final   Gram Stain NO WBC SEEN NO ORGANISMS  SEEN   Final   Culture   Final    NO GROWTH 3 DAYS Performed at [facility], [address] [place], [place] [address]    Report Status 04/05/2017 FINAL  Final  Acid Fast Smear (AFB)     Status: None   Collection Time: 04/02/17 11:50 AM  Result Value Ref Range Status   AFB Specimen Processing Concentration  Final   Acid Fast Smear Negative  Final    Comment: (NOTE) Performed At: [facility] [place] [address] [place], [place] [id number] Mila HomerHancock William F MD ON:[phone]    Source (AFB) PLEURAL  Final    Coagulation Studies:  Recent Labs  04/11/17 1634  LABPROT 14.5  INR 1.14    Urinalysis: No results for input(s): COLORURINE, LABSPEC, PHURINE, GLUCOSEU, HGBUR, BILIRUBINUR, KETONESUR, PROTEINUR, UROBILINOGEN, NITRITE, LEUKOCYTESUR in the last 72 hours.  Invalid input(s): APPERANCEUR    Imaging: [place] Paracentesis  Result Date: 04/11/2017 INDICATION: Large volume ascites EXAM: ULTRASOUND GUIDED  PARACENTESIS MEDICATIONS: None. COMPLICATIONS: None immediate. PROCEDURE: Informed written consent was obtained from the patient after a discussion of the risks, benefits and alternatives to treatment. A timeout was performed prior to the initiation of the procedure. Initial ultrasound scanning demonstrates a large amount of ascites within the right abdomen. The right abdomen was prepped and draped in the usual sterile fashion. 1% lidocaine was used for local anesthesia. Following this, a 6 Fr Safe-T-Centesis catheter was introduced. An ultrasound image was saved for documentation purposes. The paracentesis was performed. The catheter was removed and a dressing was applied. The patient tolerated the procedure well without immediate post procedural complication. FINDINGS: A total of approximately 5 L of clear yellow fluid was removed. Samples were sent to the laboratory as requested by the clinical team. IMPRESSION: Successful ultrasound-guided paracentesis yielding 5 liters of  peritoneal fluid. Electronically Signed   By: Alcide CleverMark  Lukens M.D.   On: 04/11/2017 12:48     Medications:   . albumin human    . heparin 1,700 Units/hr (04/12/17 0909)   . amLODipine  5 mg Oral Daily  . bisoprolol  10 mg Oral Daily  . buPROPion  100 mg Oral BID  . collagenase   Topical Daily  . docusate sodium  100 mg Oral BID  . feeding supplement (NEPRO CARB STEADY)  237 mL Oral TID BM  . ferrous sulfate  325 mg Oral Q1200  . irbesartan  300 mg Oral QHS  . levothyroxine  75 mcg Oral QAC breakfast  . multivitamin  1 tablet Oral QHS   acetaminophen **OR** [DISCONTINUED] acetaminophen, albumin human, hydrALAZINE, ipratropium-albuterol, lip balm, LORazepam,  morphine injection, [DISCONTINUED] ondansetron **OR** ondansetron (ZOFRAN) IV, traMADol  Assessment/ Plan:  [name] is a 36 y.o. white male with hypertension, morbid obesity, pulmonary hypertension, hypothyroidism, hyperlipidemia, depression/anxiety, coronary artery disease, congestive heart failure, who was admitted to [facility] on 03/30/2017  1. Acute renal failure on chronic kidney disease stage III with nephrotic range proteinuria, hematuria and glucosuria No baseline creatinine available. Creatinine of 1.3 in 12/2014 (care everywhere).  Concern for progression of chronic kidney disease to CKD stage V.  Patient has Nephrotic syndrome of unknown etiology. He states he has been dealing with edema for over 2-3 yeas but did not seek medical care. Renal ultrasound report reviewed. HIV negative Not a candidate for kidney biopsy due to body habitus/ in ability to lie prone/ current infection  - 1st HD on 8/20; PPD has been placed on 8/20 -  As before patient does have nephrotic syndrome. Currently we are considering renal biopsy. This will likely need to be coordinated with radiology. It perhaps could be performed with the patient on his side. We will need to consult with interventional radiology regarding this case. -  We will plan for  dialysis today. Urine output is improving therefore we will limit ultrafiltration.  2. Anasarca, with HTN. Echocardiogram with normal systolic function 8/18. No visualization of hepatic cirrhosis on CT abd/pelvis -  Set ultrafiltration target to 1 kg today.  3. Hyperphosphatemia.   PTH 60  - Follow-up serum phosphorus today..  4. Anemia of chronic kidney disease: Hemoglobin up to 8.9 posttransfusion.  5. Generalized weakness with muscle wasting Neurology evaluation in progress - hold statin - Appreciate rheumatology input. Patient needs deltoid muscle biopsy.  6. B/l pleural effusion -  Thoracentesis performed 04/09/2017.    LOS: 13 [name] 8/30/201811:07 AM

## 2017-04-12 NOTE — Progress Notes (Signed)
Pre dialysis  

## 2017-04-12 NOTE — Progress Notes (Signed)
Post hd assessment unchanged  

## 2017-04-12 NOTE — Consult Note (Signed)
  Follow-up myopathy. CPK aldolase normal. Has not been out of bed. The last today. No complaints of dysphagia today. Less short of breath. No change in exam. Still concerned about significant myopathy. Has some proximal and muscle weakness. Significant muscle atrophy. CPK may be visceromegaly leading given his profound muscle loss. Differential stays the same as per last note. Agree with the coordination of.coagulation so that he could have a muscle biopsy of surgeons would agree. Would have to alert pathology to appropriately process the biopsy specimen. Would particularly need [facility] to [facility] for interpretation and electron microscopy Less concern about dysphagia. Ideally we would perform a barium swallow to look at of this is esophageal muscles may be difficult given his inability to sit up. Agree with renal biopsy if can be worked out safely with logistics.

## 2017-04-12 NOTE — Progress Notes (Signed)
[employer] [facility] [place] [facility]   PATIENT NAME: Brian FlackJoseph [name]    MR#:  [id number]  DATE OF BIRTH:  1981/02/05  SUBJECTIVE:     The patient alert and awake, Complains of generalized weakness and mild shortness breath,  BIPAP at night, on O2  4L.  REVIEW OF SYSTEMS:    Review of Systems  Constitutional: Negative for fever, chills weight loss  Positive generalized weakness  HENT: Negative for ear pain, nosebleeds, congestion, facial swelling, rhinorrhea, neck pain, neck stiffness and ear discharge.   Respiratory: Negative for cough, mild shortness of breath, no wheezing  Cardiovascular: Negative for chest pain, palpitations and better leg swelling.  Gastrointestinal: Negative for heartburn, abdominal pain, vomiting, diarrhea or consitpation Genitourinary: Negative for dysuria, urgency, frequency, hematuria Musculoskeletal: Negative for back pain or joint pain Neurological: Negative for dizziness, seizures, syncope, focal weakness,  numbness and headaches.  Hematological: Does not bruise/bleed easily.  Psychiatric/Behavioral: Negative for hallucinations, confusion, dysphoric mood Skin: no rash. DRUG ALLERGIES:  No Known Allergies  VITALS:  Blood pressure (!) 155/90, pulse 82, temperature 98 F (36.7 C), temperature source Oral, resp. rate 20, height 5\' 6"  (1.676 m), weight (!) 334 lb (151.5 kg), SpO2 98 %.  PHYSICAL EXAMINATION:  Constitutional: Appears obese anasarca. NAD. Morbid obesity. onBIPAP. HENT: Normocephalic. [name]. Oropharynx is clear and moist.  Eyes: Conjunctivae and EOM are normal. PERRLA, no scleral icterus.  Neck: Normal ROM. Neck supple. No JVD. No tracheal deviation. CVS: RRR, S1/S2 +, no murmurs, no gallops, no carotid bruit.  Pulmonary: Decreased breath sounds with scant bilateral wheezing at bases Abdominal: better abdominal wall edema, bowel sounds present. No tenderness or distention. Due to body habitus unable to appreciate for  organomegaly. Musculoskeletal:  Much better bilateral leg edema, no cyanosis, tenderness on left leg.  Neuro: Alert. CN 2-12 grossly intact. No focal deficits. Power 2/5 in lower extremities and 4/5 in upper extremities.  Skin:left thigh covered wound   Right femoral dialysis catheter placed Psychiatric:alert and awake. LABORATORY PANEL:   CBC  Recent Labs Lab 04/12/17 0421  WBC 12.3*  HGB 8.9*  HCT 27.9*  PLT 393   ------------------------------------------------------------------------------------------------------------------  Chemistries   Recent Labs Lab 04/11/17 0500 04/12/17 0421  NA 142 141  K 3.7 3.9  CL 102 102  CO2 31 31  GLUCOSE 97 99  BUN 53* 58*  CREATININE 4.28* 4.74*  CALCIUM 7.2* 7.5*  MG 1.8  --    ------------------------------------------------------------------------------------------------------------------  Cardiac Enzymes No results for input(s): TROPONINI in the last 168 hours. ------------------------------------------------------------------------------------------------------------------  RADIOLOGY:  [place] Paracentesis  Result Date: 04/11/2017 INDICATION: Large volume ascites EXAM: ULTRASOUND GUIDED  PARACENTESIS MEDICATIONS: None. COMPLICATIONS: None immediate. PROCEDURE: Informed written consent was obtained from the patient after a discussion of the risks, benefits and alternatives to treatment. A timeout was performed prior to the initiation of the procedure. Initial ultrasound scanning demonstrates a large amount of ascites within the right abdomen. The right abdomen was prepped and draped in the usual sterile fashion. 1% lidocaine was used for local anesthesia. Following this, a 6 Fr Safe-T-Centesis catheter was introduced. An ultrasound image was saved for documentation purposes. The paracentesis was performed. The catheter was removed and a dressing was applied. The patient tolerated the procedure well without immediate post procedural  complication. FINDINGS: A total of approximately 5 L of clear yellow fluid was removed. Samples were sent to the laboratory as requested by the clinical team. IMPRESSION: Successful ultrasound-guided paracentesis yielding 5 liters of peritoneal fluid.  Electronically Signed   By: Alcide Clever M.D.   On: 04/11/2017 12:48     ASSESSMENT AND PLAN:   36 year old male with history of chronic diastolic heart failure, primary pulmonary hypertension and hypothyroidism with chronic kidney disease stage III who presents for [facility] due to shortness of breath.  1. Acute on chronic hypoxic respiratory failure with hypoxia in the setting of fluid overload as well as small pneumothorax After thoracentesis Patient titrated off of BiPAP and on nasal cannula 4L. Pneumothorax has resolved  Pulmonologist recommended an chest x-ray in one week which would be this Wednesday. He was put on BiPAP again due to hypercapnia. NEB. Try to wean down  O2 [place]. Continue NEB.  BIPAP prn.  Obesity Hypoventilation Syndrome. BIPAP prn.  * Bilateral moderate to large pleural effusion. S/p US guided thoracentesis. 2 liters of clear, yellowish fluid removed. CXR: Small bilateral pleural effusions, right greater the left, significantly decreased on the left and stable on the right.  2 Acute on chronic kidney failure Stage III:  progression of chronic kidney disease to chronic kidney disease stage V Nephrology consult appreciated. Renal ultrasound showed no hydronephrosis Patient Has started dialysis which was initiated August 20. Not a candidate for kidney biopsy due to body habitus He is treated wtih IV albumin during HD. Per Dr. Randa Evens today and consult with interventional radiology regarding for kidney biopsy.  * Ascites. Large amount of ascites per [place].  S/P US guided paracentesis this am (August 29), yielding 5 liters of peritoneal fluid.. S/p U/s guideded paracentesis status post 2.8 L of fluid on  August 20.  3.  Acute on chronic diastolic heart failure and pulmonary hypertension and anasarca Continue hemodialysis  Echocardiogram shows normal ejection fraction without major valvular abnormalities. Per Dr. Cherylann Ratel, therefore no urgent indication for dialysis today.  4. Left thigh wound:  Status post bedside debridement by surgeon. MULTIPLE ORGANISMS PRESENT, NONE PREDOMINANT. NO ANAEROBES ISOLATED per wound cultures.  On vancomycin and cefepime and stopped antibiotics on August 25. Pressure offloading Daily dressing changes with Santyl.  4. Accelerated Essential hypertension: Continue irbesartan and Bisoprolol, resumed Clonidine due to accelerated hypertension, but discontinued since SBP goal 130-150 to allow for adequate fluid removal with HD per Dr. Thedore Mins.  5. Hypothyroid: Continue Synthroid 6. Hyperlipidemia: Continue statin 7. 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 Eliquis is hold and started heparin drip for biosy ( muscle and kidney)  Anemia of chronic disease. Hemoglobin decreased to 6.8. He got 1 unit PRBC transfusion, follow-up hemoglobin up to 8.9.  Generalized weakness with muscle wasting Hold statin. Per Dr. Thad Ranger, may need EMG as outpatient. Rheumatology consult. Muscle biopsy per Dr. Gavin Potters. Need hold Eliquis for 3-4 days and started heparin drip. Dr. Excell Seltzer discussed with pathologist, there is no survice for muscle biopsy in this hospital. The patient may get muscle biopsy as outpatient at [facility].  Discussed with  Dr. Excell Seltzer and Dr. Cherylann Ratel. Physical therapy is recommending the patient return back to skilled nursing facility once stable for discharge Management plans discussed with the patient and he is in agreement.  CODE STATUS: full  TOTAL TIME TAKING CARE OF THIS PATIENT: 46 minutes.   POSSIBLE D/C >3  days, DEPENDING ON CLINICAL CONDITION.    Shaune Pollack M.D  on 04/12/2017 at 1:53 PM  Between 7am to 6pm - Pager - [phone] After 6pm go to [email or web address] - password [email or web address]  [facility]  Office  [phone]  CC: Primary care physician; Patient, No Pcp Per  Note: This dictation was prepared with Dragon dictation along with smaller phrase technology. Any transcriptional errors that result from this process are unintentional.

## 2017-04-12 NOTE — Progress Notes (Signed)
Pre hd 

## 2017-04-12 NOTE — Progress Notes (Signed)
HD completed without issue. UF 1L as ordered. Vitals remained stable throughout. Patient has no complaints.

## 2017-04-12 NOTE — Care Management (Signed)
Notification from Candace at [facility] that no Medicaid beds available

## 2017-04-12 NOTE — Progress Notes (Signed)
ANTICOAGULATION CONSULT NOTE - Initial Consult  Pharmacy Consult for heparin Indication: vte treatment  No Known Allergies  Patient Measurements: Height: 5\' 6"  (167.6 cm) Weight:  (uta=bedscale broken) IBW/kg (Calculated) : 63.8 Heparin Dosing Weight: 101  Vital Signs: Temp: 98 F (36.7 C) (08/30 1218) Temp Source: Oral (08/30 1218) BP: 147/85 (08/30 1315) Pulse Rate: 71 (08/30 1315)  Labs:  Recent Labs  04/11/17 0500 04/11/17 1634 04/12/17 0421 04/12/17 1230  HGB 6.8*  --  8.9*  --   HCT 21.8*  --  27.9*  --   PLT 358  --  393  --   APTT  --  40* 67* 67*  LABPROT  --  14.5  --   --   INR  --  1.14  --   --   HEPARINUNFRC  --   --  0.80*  --   CREATININE 4.28*  --  4.74*  --   CKTOTAL  --  32*  --   --     Estimated Creatinine Clearance: 30.7 mL/min (A) (by C-G formula based on SCr of 4.74 mg/dL (H)).   Medical History: Past Medical History:  Diagnosis Date  . CKD (chronic kidney disease)   . Depression   . Diastolic CHF (HCC)   . Generalized anxiety disorder   . Hyperlipidemia   . Hypertension   . Hypertension   . Hypothyroidism   . Lymphedema   . Obesity   . Pulmonary hypertension (HCC)     Medications:  Scheduled:  . amLODipine  5 mg Oral Daily  . bisoprolol  10 mg Oral Daily  . buPROPion  100 mg Oral BID  . collagenase   Topical Daily  . docusate sodium  100 mg Oral BID  . feeding supplement (NEPRO CARB STEADY)  237 mL Oral TID BM  . ferrous sulfate  325 mg Oral Q1200  . irbesartan  300 mg Oral QHS  . levothyroxine  75 mcg Oral QAC breakfast  . multivitamin  1 tablet Oral QHS   Infusions:  . [START ON 04/13/2017] sodium chloride    . albumin human    . heparin 1,700 Units/hr (04/12/17 1316)   PRN: acetaminophen **OR** [DISCONTINUED] acetaminophen, albumin human, hydrALAZINE, ipratropium-albuterol, lip balm, LORazepam, morphine injection, [DISCONTINUED] ondansetron **OR** ondansetron (ZOFRAN) IV, traMADol  Assessment: 36 year old male  with VTE, Dr Imogene Burnhen would like continued anticoagulation post 2 doses of apixaban with heparin gtt. Starting gtt at 2200 because am dose of apixaban was given at 1000.  Will need to follow aptt until levels correlate.  Goal of Therapy:  Heparin level 0.3-0.7 units/ml Monitor platelets by anticoagulation protocol: Yes   Plan:   HL=67 Therapeutic, however dose was increased to 1700 units/hr and level remain at 67. Therefore, I will increase to 1800 units/hr. Check level in 6 hours  Aram Domzalski D Blondine Hottel, Pharm.D, BCPS Clinical Pharmacist  04/12/2017,1:23 PM

## 2017-04-12 NOTE — Progress Notes (Signed)
SLP Cancellation Note  Patient Details Name: [name] MRN: [id number] DOB: 03/22/1981   Cancelled treatment:       Reason Eval/Treat Not Completed: Patient at procedure or test/unavailable (chart reviewed; NSG consulted. Met w/ pt).  Met w/ pt as Dialysis was being initiated(next 3-3.5 hrs per NSG). Pt does have to recline during such per NSG; this is not appropriate positioning for oral intake.  Will f/u w/ pt in the morning w/ swallowing assessment during breakfast meal. Pt agreed.  Of note, when consulted pt's primary Nurse, she denied pt having any difficulty swallowing pills w/ liquids, meals. Pt also denied any overt s/s of aspiration w/ meals. Educated pt on general aspiration precautions. ST will f/u in the morning.    Katherine Watson, MS, CCC-SLP Watson,Katherine 04/12/2017, 1:42 PM   

## 2017-04-12 NOTE — Plan of Care (Signed)
Problem: Activity: Goal: Risk for activity intolerance will decrease Outcome: Not Progressing Pt needs PT consult.

## 2017-04-12 NOTE — Progress Notes (Signed)
HD initiated at bedside in patient room. Vitals stable. Patient currently without complaints. Continue to monitor.

## 2017-04-12 NOTE — Progress Notes (Signed)
Order received from Dr Imogene Burnhen for physical therapy

## 2017-04-13 LAB — APTT: APTT: 54 s — AB (ref 24–36)

## 2017-04-13 LAB — HEPARIN LEVEL (UNFRACTIONATED): HEPARIN UNFRACTIONATED: 0.26 [IU]/mL — AB (ref 0.30–0.70)

## 2017-04-13 MED ORDER — FERROUS SULFATE 325 (65 FE) MG PO TABS: 325.0000 mg | ORAL_TABLET | Freq: Every day | ORAL | 3 refills | Status: AC

## 2017-04-13 MED ORDER — DOCUSATE SODIUM 100 MG PO CAPS: 100.0000 mg | ORAL_CAPSULE | Freq: Two times a day (BID) | ORAL | 0 refills | Status: AC

## 2017-04-13 MED ORDER — RENA-VITE PO TABS: 1.0000 | ORAL_TABLET | Freq: Every day | ORAL | 0 refills | Status: DC

## 2017-04-13 MED ORDER — IRBESARTAN 300 MG PO TABS: 300.0000 mg | ORAL_TABLET | Freq: Every day | ORAL | Status: DC

## 2017-04-13 MED ORDER — HEPARIN (PORCINE) IN NACL 100-0.45 UNIT/ML-% IJ SOLN
2100.0000 [IU]/h | INTRAMUSCULAR | Status: DC
Start: 2017-04-13 — End: 2017-04-17
  Administered 2017-04-13: 1900 [IU]/h via INTRAVENOUS
  Administered 2017-04-13: 1800 [IU]/h via INTRAVENOUS
  Administered 2017-04-14: 1900 [IU]/h via INTRAVENOUS
  Administered 2017-04-15: 2100 [IU]/h via INTRAVENOUS
  Administered 2017-04-15: 1900 [IU]/h via INTRAVENOUS
  Administered 2017-04-16 (×2): 2100 [IU]/h via INTRAVENOUS
  Filled 2017-04-13 (×6): qty 250

## 2017-04-13 NOTE — Progress Notes (Signed)
SLP Cancellation Note  Patient Details Name: [name] MRN: [id number] DOB: 1981-05-04   Cancelled treatment:       Reason Eval/Treat Not Completed: Patient at procedure or test/unavailable (chart reviewed; consulted NSG. Pt NPO for test this am.). ST will f/u when pt is taking his oral diet again per MD. NSG agreed.   Jerilynn SomKatherine June Rode, MS, CCC-SLP [name] 04/13/2017, 8:58 AM

## 2017-04-13 NOTE — Discharge Summary (Signed)
[employer] [facility] [place] at [facility]   PATIENT NAME: [name]    MR#:  [id number]  DATE OF BIRTH:  November 23, 1980  DATE OF ADMISSION:  03/30/2017   ADMITTING PHYSICIAN: Enid Baas, MD  DATE OF DISCHARGE: No discharge date for patient encounter.  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 CKD (chronic kidney disease)   . Depression   . Diastolic CHF (HCC)   . Generalized anxiety disorder   . Hyperlipidemia   . Hypertension   . Hypertension   . Hypothyroidism   . Lymphedema   . Obesity   . Pulmonary hypertension [facility])    HOSPITAL COURSE:   36 year old male with history of chronic diastolic heart failure, primary pulmonary hypertension and hypothyroidism with chronic kidney disease stage III who presents for [facility] due to shortness of breath.   1. Acute on chronic hypoxic respiratory failure with hypoxia in the setting of fluid overload as well as small pneumothorax After thoracentesis Patient titrated off of BiPAP and on nasal cannula 4L. Pneumothorax has resolved  Pulmonologist recommended an chest x-ray in one week which would be this Wednesday. He was put on BiPAP again due to hypercapnia. NEB. Try to wean down  O2 [place]. Continue NEB.  BIPAP prn.  Obesity Hypoventilation Syndrome. BIPAP prn.  * Bilateral moderate to large pleural effusion. S/p US guided thoracentesis. 2 liters of clear, yellowish fluid removed. CXR: Small bilateral pleural  effusions, right greater the left, significantly decreased on the left and stable on the right.  2 Acute on chronic kidney failure Stage III:  progression of chronic kidney disease to chronic kidney disease stage V Renal ultrasound showed no hydronephrosis Patient Has started dialysis which was initiated August 20. He is treated wtih IV albumin during HD. Per Dr. Cherylann Ratel, continue dialysis and consult with interventional radiology regarding for kidney biopsy.  Per radiologist, Image guided renal biopsy will be very difficult with this patient due to his body habitus and may not be technically feasible.  In addition, I would be very concerned about managing any bleeding complication after the biopsy.  Patient would likely need to be off anticoagulation for at least 24 hours after a renal biopsy.   Discussed with Dr. Cherylann Ratel and feel that the risks of a renal biopsy at this time are too high and will not proceed with biopsy.   * Ascites. Large amount of ascites per [place].  S/P US guided paracentesis this am (August 29), yielding 5 liters of peritoneal fluid.. S/p U/s guideded paracentesis status post 2.8 L of fluid on August 20.  3.  Acute on chronic diastolic heart failure and pulmonary hypertension and anasarca Continue hemodialysis  Echocardiogram shows normal ejection fraction without major valvular abnormalities.  4. Left thigh wound:  Status post bedside debridement by surgeon. MULTIPLE ORGANISMS PRESENT, NONE PREDOMINANT. NO ANAEROBES ISOLATED per wound cultures.  On vancomycin and cefepime and stopped antibiotics on August 25. Pressure offloading Daily dressing changes with Santyl.  4. Accelerated Essential hypertension: Continue irbesartan and  Bisoprolol, resumed Clonidine due to accelerated hypertension, but discontinued since SBP goal 130-150 to allow for adequate fluid removal with HD per Dr. Thedore Mins.  5. Hypothyroid: Continue Synthroid 6. Hyperlipidemia: Continue statin 7.  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 Eliquis is hold (since August 29) and started heparin drip for biosy (muscle and kidney)  Anemia of chronic disease. Hemoglobin decreased to 6.8. He got 1 unit PRBC transfusion, follow-up hemoglobin up to 8.9.  Generalized weakness with muscle wasting CK 32, Aldolase 7.3. Hold statin. Per Dr. Thad Ranger, may need EMG as outpatient. Rheumatology consult. Muscle biopsy per Dr. Gavin Potters. Need hold Eliquis for 3-4 days and started heparin drip. Dr. Excell Seltzer discussed with pathologist, there is no service for muscle biopsy in this hospital. The patient may get muscle biopsy in [place] per Dr. Cherylann Ratel.  Discussed with  Dr. Excell Seltzer and Dr. Cherylann Ratel.  I discussed [facility] transfer center and discussed with [facility] hospitalist Dr. Ivonne Andrew.he kindly accepted the patient but no bed is available for now.  DISCHARGE CONDITIONS:  Pending discharge. CONSULTS OBTAINED:  Treatment Team:  Lamont Dowdy, MD Thana Farr, MD Kandyce Rud., MD DRUG ALLERGIES:  No Known Allergies DISCHARGE MEDICATIONS:   Allergies as of 04/13/2017   No Known Allergies     Medication List    STOP taking these medications   atorvastatin 20 MG tablet Commonly known as:  LIPITOR   cloNIDine 0.2 MG tablet Commonly known as:  CATAPRES   hydrALAZINE 50 MG tablet Commonly known as:  APRESOLINE   metolazone 2.5 MG tablet Commonly known as:  ZAROXOLYN     TAKE these medications   amLODipine 5 MG tablet Commonly known as:  NORVASC Take 5 mg by mouth daily. What changed:  Another medication with the same name was removed. Continue taking this medication, and follow the directions you see here.   aspirin 325 MG EC tablet Take 325 mg by mouth daily.   bisoprolol 10 MG tablet Commonly known as:  ZEBETA Take 10 mg by mouth daily.   buPROPion 100 MG tablet Commonly known as:   WELLBUTRIN Take 100 mg by mouth 2 (two) times daily.   docusate sodium 100 MG capsule Commonly known as:  COLACE Take 1 capsule (100 mg total) by mouth 2 (two) times daily.   ELIQUIS 5 MG Tabs tablet Generic drug:  apixaban Take 5 mg by mouth 2 (two) times daily.   ferrous sulfate 325 (65 FE) MG tablet Take 1 tablet (325 mg total) by mouth daily at 12 noon.   irbesartan 300 MG tablet Commonly known as:  AVAPRO Take 1 tablet (300 mg total) by mouth at bedtime.   levothyroxine 75 MCG tablet Commonly known as:  SYNTHROID, LEVOTHROID Take 75 mcg by mouth daily before breakfast.   multivitamin Tabs tablet Take 1 tablet by mouth at bedtime.   nystatin powder Commonly known as:  MYCOSTATIN/NYSTOP Apply topically 2 (two) times daily.   ondansetron 8 MG tablet Commonly known as:  ZOFRAN Take by mouth every 8 (eight) hours as needed for nausea or vomiting.   potassium chloride SA 20 MEQ tablet Commonly known as:  K-DUR,KLOR-CON Take 20 mEq by mouth every other day.   SANTYL ointment Generic drug:  collagenase Apply 1 application topically daily.   traMADol 50 MG tablet Commonly known as:  ULTRAM Take 50 mg by mouth every 8 (eight) hours as needed.  Discharge Care Instructions        Start     Ordered   04/14/17 0000  ferrous sulfate 325 (65 FE) MG tablet  Daily     04/13/17 1309   04/13/17 0000  irbesartan (AVAPRO) 300 MG tablet  Daily at bedtime     04/13/17 1309   04/13/17 0000  multivitamin (RENA-VIT) TABS tablet  Daily at bedtime     04/13/17 1309   04/13/17 0000  docusate sodium (COLACE) 100 MG capsule  2 times daily     04/13/17 1309       DISCHARGE INSTRUCTIONS:  See AVS.  If you experience worsening of your admission symptoms, develop shortness of breath, life threatening emergency, suicidal or homicidal thoughts you must seek medical attention immediately by calling 911 or calling your MD immediately  if symptoms less severe.  You Must  read complete instructions/literature along with all the possible adverse reactions/side effects for all the Medicines you take and that have been prescribed to you. Take any new Medicines after you have completely understood and accpet all the possible adverse reactions/side effects.   Please note  You were cared for by a hospitalist during your hospital stay. If you have any questions about your discharge medications or the care you received while you were in the hospital after you are discharged, you can call the unit and asked to speak with the hospitalist on call if the hospitalist that took care of you is not available. Once you are discharged, your primary care physician will handle any further medical issues. Please note that NO REFILLS for any discharge medications will be authorized once you are discharged, as it is imperative that you return to your primary care physician (or establish a relationship with a primary care physician if you do not have one) for your aftercare needs so that they can reassess your need for medications and monitor your lab values.    On the day of Discharge:  VITAL SIGNS:  Blood pressure (!) 145/78, pulse 70, temperature 97.8 F (36.6 C), temperature source Oral, resp. rate 20, height 5\' 6"  (1.676 m), weight (!) 334 lb (151.5 kg), SpO2 100 %. PHYSICAL EXAMINATION:  GENERAL:  36 y.o.-year-old patient lying in the bed with no acute distress. Morbid obesity. EYES: Pupils equal, round, reactive to light and accommodation. No scleral icterus. Extraocular muscles intact.  HEENT: Head atraumatic, normocephalic. Oropharynx and nasopharynx clear.  NECK:  Supple, no jugular venous distention. No thyroid enlargement, no tenderness.  LUNGS: Normal breath sounds bilaterally, no wheezing, rales,rhonchi or crepitation. No use of accessory muscles of respiration.  CARDIOVASCULAR: S1, S2 normal. No murmurs, rubs, or gallops.  ABDOMEN: Soft, non-tender, non-distended. Bowel  sounds present. Morbid obesity. EXTREMITIES: No cyanosis, or clubbing. Better anasarca. NEUROLOGIC: Cranial nerves II through XII are intact.muscle atrophy.  Muscle strength 3/5 in all extremities. Sensation intact. Gait not checked. left thigh wound in dressing. PSYCHIATRIC: The patient is alert and oriented x 3.  SKIN: No obvious rash, lesion, or ulcer.  DATA REVIEW:   CBC  Recent Labs Lab 04/12/17 0421  WBC 12.3*  HGB 8.9*  HCT 27.9*  PLT 393    Chemistries   Recent Labs Lab 04/11/17 0500 04/12/17 0421  NA 142 141  K 3.7 3.9  CL 102 102  CO2 31 31  GLUCOSE 97 99  BUN 53* 58*  CREATININE 4.28* 4.74*  CALCIUM 7.2* 7.5*  MG 1.8  --      Microbiology Results  Results for orders placed or performed during the hospital encounter of 03/30/17  Aerobic/Anaerobic Culture (surgical/deep wound)     Status: Abnormal   Collection Time: 03/30/17  7:56 PM  Result Value Ref Range Status   Specimen Description WOUND LEFT THIGH  Final   Special Requests NONE  Final   Gram Stain   Final    ABUNDANT WBC PRESENT, PREDOMINANTLY PMN ABUNDANT GRAM POSITIVE COCCI ABUNDANT GRAM NEGATIVE RODS MODERATE GRAM POSITIVE RODS MODERATE GRAM NEGATIVE COCCI    Culture (A)  Final    MULTIPLE ORGANISMS PRESENT, NONE PREDOMINANT NO ANAEROBES ISOLATED Performed at [facility], [address], [place], [place] [address]    Report Status 04/05/2017 FINAL  Final  MRSA PCR Screening     Status: Abnormal   Collection Time: 03/30/17  8:58 PM  Result Value Ref Range Status   MRSA by PCR POSITIVE (A) NEGATIVE Final    Comment:        The GeneXpert MRSA Assay (FDA approved for NASAL specimens only), is one component of a comprehensive MRSA colonization surveillance program. It is not intended to diagnose MRSA infection nor to guide or monitor treatment for MRSA infections. RESULT CALLED TO, READ BACK BY AND VERIFIED WITH: KARA CAMPBELL AT 2230 03/30/17.PMH   Body fluid culture      Status: None   Collection Time: 04/02/17 11:50 AM  Result Value Ref Range Status   Specimen Description PLEURAL  Final   Special Requests NONE  Final   Gram Stain NO WBC SEEN NO ORGANISMS SEEN   Final   Culture   Final    NO GROWTH 3 DAYS Performed at [facility], [address], [place], [place] [address]    Report Status 04/05/2017 FINAL  Final  Acid Fast Smear (AFB)     Status: None   Collection Time: 04/02/17 11:50 AM  Result Value Ref Range Status   AFB Specimen Processing Concentration  Final   Acid Fast Smear Negative  Final    Comment: (NOTE) Performed At: [facility] [address] [place], [place] [id number] Mila Homer MD QM:[id number]    Source (AFB) PLEURAL  Final    RADIOLOGY:  No results found.   Management plans discussed with the patient, family and they are in agreement.  CODE STATUS: Full Code   TOTAL TIME TAKING CARE OF THIS PATIENT: 52 minutes.    Shaune Pollack M.D on 04/13/2017 at 1:05 PM  Between 7am to 6pm - Pager - [phone]  After 6pm go to [email or web address] - Social research officer, government  [facility] [place] [facility]  Office  [phone]  CC: Primary care physician; Patient, No Pcp Per   Note: This dictation was prepared with Dragon dictation along with smaller phrase technology. Any transcriptional errors that result from this process are unintentional.

## 2017-04-13 NOTE — Evaluation (Signed)
Physical Therapy Evaluation Patient Details Name: [name] MRN: [id number] DOB: Jan 11, 1981 Today's Date: 04/13/2017   History of Present Illness  Pt is a 36 y.o. M who presented to the ED from [facility] with SOB, renal failure, multiple skin wounds, and swelling in B LE. Pt admitted 03/30/2017 with acute on chronic renal failure, acute on chronic respiratory failure with hypoxia, and B pulmonary effusion. Pt s/p R IJ temporary HD catheter placement, s/p debridement of a L thigh wound, and s/p L thoracentesis. Hospital stay complicated by transfer to CCU (now returned to floor) for hypercarbic respiratory failure requiring BiPAP; additionally significant for L thoracentesis (8/27) with removal of 2L and paracentesis (8/29) with removal of 5L.  Currently on 4L supplemental O2 via [place].  PMH: occassional tremors, B lymphedema, obesity, CKD, R cardiac cath (2016), HLD, HTN, major depression, diastolic CHF, primary pulmonary HTN, hypothyroidism, and new dialysis patient.  Clinical Impression  Patient with significant improvement in level of alertness and ability/willingness to participate this date.  Demonstrates active ROM (3-/5) throughout all extremities, but significant muscle wasting/atrophy evident.  Currently requiring mod assist for rolling bilat; mod assist for supine to sit (transition towards R), max assist +2 for sit to supine; max assist +2 for partial sit/stand with RW; unable/unsafe to attempt gait due to profound weakness in bilat LEs.  Tolerating unsupported sitting x5 min during session, but requires bilat UE support throughout; unable to reach neutral sitting position due to adiposity and back pain.   Completed transfer OOB to chair with viking lift; patient tolerated well and without difficulty.  Posterior surface of LEs padded with bath blanket to protect skin/maintain comfort.  Positioned to comfort with LEs elevated and L arm rest dropped (to accommodate patient's width).  Placed  beside bed to provide barrier and maintain safety.  Encouraged to remain OOB as tolerated; nursing assist with return to bed as able.  Recommend strict use of lift for all OOB transfers at this time; RN informed/aware. Would benefit from skilled PT to address above deficits and promote optimal return to PLOF; recommend transition to STR upon discharge from acute hospitalization.     Follow Up Recommendations SNF    Equipment Recommendations       Recommendations for Other Services       Precautions / Restrictions Precautions Precautions: Fall;Other (comment) Precaution Comments: R IJ temporary HD cath restrictions (per discussion with Dr Thedore Mins 04/06/17 MD cleared pt for OOB to chair mobility) Restrictions Weight Bearing Restrictions: No      Mobility  Bed Mobility Overal bed mobility: Needs Assistance Bed Mobility: Supine to Sit;Sit to Supine;Rolling Rolling: Mod assist   Supine to sit: Mod assist Sit to supine: Max assist;+2 for physical assistance      Transfers Overall transfer level: Needs assistance Equipment used: Rolling walker (2 wheeled) Transfers: Sit to/from Stand Sit to Stand: Max assist;+2 physical assistance         General transfer comment: requires elevated bed surface and extensive assist for lift off.  Able to lift buttocks, but unable to achieve full stance due to significant generalized weakness.  Ambulation/Gait             General Gait Details: unsafe/unable  Stairs            Wheelchair Mobility    Modified Rankin (Stroke Patients Only)       Balance Overall balance assessment: Needs assistance Sitting-balance support: Bilateral upper extremity supported Sitting balance-Leahy Scale: Fair Sitting balance - Comments: posterior  trunk lean, requires bilat UE support at all times. Unable to reach neutral sitting posture due to back pain.  Progressive posterior lean, decreasing control with fatigue.   Standing balance support:  Bilateral upper extremity supported Standing balance-Leahy Scale: Zero                               Pertinent Vitals/Pain Pain Assessment: Faces Faces Pain Scale: Hurts even more Pain Location: L thigh (Wound) Pain Descriptors / Indicators: Discomfort;Pressure;Sore;Tender    Home Living                   Additional Comments: plans to return to [facility]    Prior Function Level of Independence: Needs assistance         Comments: At true baseline, indep with ADLs, household and community mobility.  Progressive weakness for past 1.5 years, eventually unable to tranfsers self or ambulate.  [facility] and was participating with therapy (40-50' with RW, min assist), but has been bed-bound for most recent 2 months due to L knee pain.     Hand Dominance   Dominant Hand: Right    Extremity/Trunk Assessment   Upper Extremity Assessment Upper Extremity Assessment: Generalized weakness (grossly 4-/5 throughout; denies sensory deficit.  Significant muscle wasting/atrophy evident.)    Lower Extremity Assessment Lower Extremity Assessment: Generalized weakness (grossly 3-/ throughout with hip, knee ROM limited by edema, soft tissue mass.  Denies sensory deficit.  Significant muscle wasting/atrophy evident)       Communication   Communication: No difficulties  Cognition Arousal/Alertness: Awake/alert Behavior During Therapy: WFL for tasks assessed/performed Overall Cognitive Status: Within Functional Limits for tasks assessed                                        General Comments      Exercises Other Exercises Other Exercises: Unsupported sitting, worked to promote forward trunk lean and neutral sitting posture/balance.  Unable to achieve neutral position due to pain in back; fatigues quickly but motivated to participate as able. Other Exercises: Additional transfer to recliner with Viking lift (to  assess tolerance for lift), dep +2 for safety. Mod assist for rolling bilat to place sling, dep +2 for management of lift.  Positioned to comfort in recliner with LEs elevated.  L arm rest dropped for increased width.  RN informed/aware of position and requirement for use of lift for return to bed.  Patient encouraged to remain as long as tolerated for pulmonary hygiene and for progressive tolerance to upright for dialysis.   Assessment/Plan    PT Assessment Patient needs continued PT services  PT Problem List Decreased strength;Decreased range of motion;Decreased activity tolerance;Decreased mobility;Decreased balance;Cardiopulmonary status limiting activity;Obesity;Pain;Decreased skin integrity       PT Treatment Interventions DME instruction;Functional mobility training;Therapeutic activities;Therapeutic exercise;Balance training;Patient/family education    PT Goals (Current goals can be found in the Care Plan section)  Acute Rehab PT Goals Patient Stated Goal: to get stronger PT Goal Formulation: With patient Time For Goal Achievement: 04/27/17 Potential to Achieve Goals: Fair    Frequency Min 2X/week   Barriers to discharge        Co-evaluation               AM-PAC PT "6 Clicks" Daily Activity  Outcome Measure Difficulty  turning over in bed (including adjusting bedclothes, sheets and blankets)?: Unable Difficulty moving from lying on back to sitting on the side of the bed? : Unable Difficulty sitting down on and standing up from a chair with arms (e.g., wheelchair, bedside commode, etc,.)?: Unable Help needed moving to and from a bed to chair (including a wheelchair)?: Total Help needed walking in hospital room?: Total Help needed climbing 3-5 steps with a railing? : Total 6 Click Score: 6    End of Session Equipment Utilized During Treatment: Gait belt;Oxygen Activity Tolerance: Patient tolerated treatment well;Patient limited by fatigue Patient left: in chair;with  call bell/phone within reach;with chair alarm set Nurse Communication: Mobility status;Need for lift equipment PT Visit Diagnosis: Muscle weakness (generalized) (M62.81);Difficulty in walking, not elsewhere classified (R26.2);Pain Pain - Right/Left: Left Pain - part of body: Leg    Time: 1111-1204 PT Time Calculation (min) (ACUTE ONLY): 53 min   Charges:   PT Evaluation $PT Eval Moderate Complexity: 1 Mod PT Treatments $Therapeutic Activity: 38-52 mins   PT G Codes:   PT G-Codes **NOT FOR INPATIENT CLASS** Functional Assessment Tool Used: AM-PAC 6 Clicks Basic Mobility Functional Limitation: Mobility: Walking and moving around Mobility: Walking and Moving Around Current Status (Z6109): 100 percent impaired, limited or restricted Mobility: Walking and Moving Around Goal Status (U0454): At least 40 percent but less than 60 percent impaired, limited or restricted   Elener Custodio H. Manson Passey, PT, DPT, NCS 04/13/17, 8:41 PM [phone]

## 2017-04-13 NOTE — Progress Notes (Signed)
ANTICOAGULATION CONSULT NOTE   Pharmacy Consult for heparin drip Indication: vte treatment - on apixaban PTA No Known Allergies  Patient Measurements: Height: 5\' 6"  (167.6 cm) Weight:  (uta- bed scale broken) IBW/kg (Calculated) : 63.8 Heparin Dosing Weight: 101  Vital Signs: Temp: 98 F (36.7 C) (08/31 1607) Temp Source: Oral (08/31 1607) BP: 141/86 (08/31 1607) Pulse Rate: 69 (08/31 1607)  Labs:  Recent Labs  04/11/17 0500  04/11/17 1634 04/12/17 0421 04/12/17 1230 04/12/17 2044 04/13/17 1559  HGB 6.8*  --   --  8.9*  --   --   --   HCT 21.8*  --   --  27.9*  --   --   --   PLT 358  --   --  393  --   --   --   APTT  --   < > 40* 67* 67*  --  54*  LABPROT  --   --  14.5  --   --   --   --   INR  --   --  1.14  --   --   --   --   HEPARINUNFRC  --   --   --  0.80*  --  0.51 0.26*  CREATININE 4.28*  --   --  4.74*  --   --   --   CKTOTAL  --   --  32*  --   --   --   --   < > = values in this interval not displayed.  Estimated Creatinine Clearance: 30.7 mL/min (A) (by C-G formula based on SCr of 4.74 mg/dL (H)).   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 Starting gtt at 2200 because am dose of apixaban was given at 1000 8/29.  Will need to follow aptt until levels correlate.   Goal of Therapy:  APTT 66-102 Heparin level 0.3-0.7 units/ml Monitor platelets by anticoagulation protocol: Yes   Plan:   HL=67 Therapeutic, however dose was increased to 1700 units/hr and level remain at 67. Therefore, I will increase to 1800 units/hr. Check level in 6 hours  8/31: Heparin drip was stopped for patient to have kidney biopsy. Per Radiology note:  Discussed with Dr. Cherylann RatelLateef and feel that the risks of a renal biopsy at this time are too high and will not proceed with biopsy.  Will restart Heparin drip at 1800 units/hr, F/u aPTT and Heparin level at 1600. Last dose of apixaban on 8/29 @ 1000.   Follow aPTTs until aPTT and Heparin levels correlate.  8/31 PM: Per RN, heparin drip running at 18 ml/hr. No line issues, no s/sx of bleeding noted. No CBC today. Heparin level 0.26, aPTT 54 - will increase drip to 1900 units/hr (=19 ml/hr). HL/aPTT in 6 h. Will also get CBC with next set of levels.   Pharmacy will continue to follow.   Crist FatHannah Monnie Anspach, PharmD, BCPS Clinical Pharmacist 04/13/2017 5:40 PM

## 2017-04-13 NOTE — Progress Notes (Addendum)
[facility] [place] [facility]   PATIENT NAME: Brian FlackJoseph Lillibridge    MR#:  [id number]  DATE OF BIRTH:  1981-04-03  SUBJECTIVE:     The patient alert and awake, Complains of generalized weakness and no shortness breath,  BIPAP prn, on O2 [place] 4L.  REVIEW OF SYSTEMS:    Review of Systems  Constitutional: Negative for fever, chills weight loss  Positive generalized weakness  HENT: Negative for ear pain, nosebleeds, congestion, facial swelling, rhinorrhea, neck pain, neck stiffness and ear discharge.   Respiratory: Negative for cough, no shortness of breath, no wheezing  Cardiovascular: Negative for chest pain, palpitations and better leg swelling.  Gastrointestinal: Negative for heartburn, abdominal pain, vomiting, diarrhea or consitpation Genitourinary: Negative for dysuria, urgency, frequency, hematuria Musculoskeletal: Negative for back pain or joint pain Neurological: Negative for dizziness, seizures, syncope, focal weakness,  numbness and headaches.  Hematological: Does not bruise/bleed easily.  Psychiatric/Behavioral: Negative for hallucinations, confusion, dysphoric mood Skin: no rash. DRUG ALLERGIES:  No Known Allergies  VITALS:  Blood pressure (!) 141/86, pulse 69, temperature 98 F (36.7 C), temperature source Oral, resp. rate 20, height 5\' 6"  (1.676 m), weight (!) 334 lb (151.5 kg), SpO2 98 %.  PHYSICAL EXAMINATION:  Constitutional: Appears obese anasarca. NAD. Morbid obesity. onBIPAP. HENT: Normocephalic. [name]. Oropharynx is clear and moist.  Eyes: Conjunctivae and EOM are normal. PERRLA, no scleral icterus.  Neck: Normal ROM. Neck supple. No JVD. No tracheal deviation. CVS: RRR, S1/S2 +, no murmurs, no gallops, no carotid bruit.  Pulmonary: Decreased breath sounds with scant bilateral wheezing at bases Abdominal: better abdominal wall edema, bowel sounds present. No tenderness or distention. Due to body habitus unable to appreciate for  organomegaly. Musculoskeletal:  Much better bilateral leg edema, no cyanosis, tenderness on left leg.  Neuro: Alert. CN 2-12 grossly intact. No focal deficits. Power 2/5 in lower extremities and 4/5 in upper extremities.  Skin:left thigh covered wound   Right femoral dialysis catheter placed Psychiatric:alert and awake. LABORATORY PANEL:   CBC  Recent Labs Lab 04/12/17 0421  WBC 12.3*  HGB 8.9*  HCT 27.9*  PLT 393   ------------------------------------------------------------------------------------------------------------------  Chemistries   Recent Labs Lab 04/11/17 0500 04/12/17 0421  NA 142 141  K 3.7 3.9  CL 102 102  CO2 31 31  GLUCOSE 97 99  BUN 53* 58*  CREATININE 4.28* 4.74*  CALCIUM 7.2* 7.5*  MG 1.8  --    ------------------------------------------------------------------------------------------------------------------  Cardiac Enzymes No results for input(s): TROPONINI in the last 168 hours. ------------------------------------------------------------------------------------------------------------------  RADIOLOGY:  No results found.   ASSESSMENT AND PLAN:   36 year old male with history of chronic diastolic heart failure, primary pulmonary hypertension and hypothyroidism with chronic kidney disease stage III who presents for [facility] due to shortness of breath.   1. Acute on chronic hypoxic respiratory failure with hypoxia in the setting of fluid overload as well as small pneumothorax After thoracentesis Patient titrated off of BiPAP and on nasal cannula 4L. Pneumothorax has resolved  Pulmonologist recommended an chest x-ray in one week which would be this Wednesday. He was put on BiPAP again due to hypercapnia. NEB. Try to wean down O2 [place]. Continue NEB. BIPAP prn.  Obesity Hypoventilation Syndrome. BIPAP prn.  * Bilateral moderate to large pleural effusion. S/p US guided thoracentesis. 2 liters of clear, yellowish fluid  removed. CXR: Small bilateral pleural effusions, right greater the left, significantly decreased on the left and stable on the right.  2 Acute on chronic  kidney failure Stage III: progression of chronic kidney disease to chronic kidney disease stage V Renal ultrasound showed no hydronephrosis Patient Has started dialysis which was initiated August 20. He is treated wtih IV albumin during HD. Per Dr. Cherylann Ratel, continue dialysis and consult with interventional radiology regarding for kidney biopsy.  Per radiologist, Image guided renal biopsy will be very difficult with this patient due to his body habitus and may not be technically feasible. In addition, I would be very concerned about managing any bleeding complication after the biopsy. Patient would likely need to be off anticoagulation for at least 24 hours after a renal biopsy. Discussed with Dr. Cherylann Ratel and feel that the risks of a renal biopsy at this time are too high and will not proceed with biopsy.   * Ascites. Large amount of ascites per [place].  S/P US guided paracentesis this am (August 29), yielding 5 liters of peritoneal fluid.. S/p U/s guideded paracentesis status post 2.8 L of fluid on August 20.  3. Acute on chronic diastolic heart failure and pulmonary hypertension and anasarca Continue hemodialysis  Echocardiogram shows normal ejection fraction without major valvular abnormalities.  4. Left thigh wound:  Status post bedside debridement by surgeon. MULTIPLE ORGANISMS PRESENT, NONE PREDOMINANT. NO ANAEROBES ISOLATED per wound cultures.  On vancomycin and cefepime and stopped antibiotics on August 25. Pressure offloading Daily dressing changes with Santyl.  4. Accelerated Essential hypertension: Continue irbesartan and Bisoprolol, resumed Clonidine due to accelerated hypertension, but discontinued since SBP goal 130-150 to allow for adequate fluid removal with HD per Dr. Thedore Mins.  5. Hypothyroid: Continue Synthroid 6.  Hyperlipidemia: Continue statin 7. 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 Eliquis is hold (since August 29) and started heparin drip for biosy (muscle and kidney)  Anemia of chronic disease. Hemoglobin decreased to 6.8. He got 1 unit PRBC transfusion, follow-up hemoglobin up to 8.9.  Generalized weakness with muscle wasting CK 32, Aldolase 7.3. Hold statin. Per Dr. Thad Ranger, may need EMG as outpatient. Rheumatology consult. Muscle biopsy per Dr. Gavin Potters. Need hold Eliquis for 3-4 days and startedheparin drip. Dr. Excell Seltzer discussed with pathologist, there is no servicefor muscle biopsy in this hospital. The patient needs deltoid muscle biopsy and may transfer to [facility] per Dr. Cherylann Ratel.  Discussed with Dr. Babs Sciara Dr. Cherylann Ratel.  I discussed [facility] transfer center and discussed with [facility] hospitalist Dr. Ivonne Andrew.he kindly accepted the patient but no bed is available for now.  Physical therapy is recommending the patient return back to skilled nursing facility once stable for discharge. I discussed with RN and Child psychotherapist. Management plans discussed with the patient and he is in agreement.  CODE STATUS: full  TOTAL TIME TAKING CARE OF THIS PATIENT: 52 minutes.   POSSIBLE D/C ? days, DEPENDING ON CLINICAL CONDITION.    Shaune Pollack M.D on 04/13/2017 at 5:36 PM  Between 7am to 6pm - Pager - [phone] After 6pm go to [email or web address] - password [email or web address]  [facility]  Office  [phone]  CC: Primary care physician; Patient, No Pcp Per  Note: This dictation was prepared with Dragon dictation along with smaller phrase technology. Any transcriptional errors that result from this process are unintentional.

## 2017-04-13 NOTE — OR Nursing (Signed)
Call to Dr Lowella DandyHenn to verify when to stop IV heparin, orders pending,

## 2017-04-13 NOTE — Progress Notes (Signed)
Patient assisted back to bed using the viking lift.  He was feeling bad and very uncomfortable in the chair

## 2017-04-13 NOTE — Consult Note (Signed)
Chief Complaint: Patient was seen in consultation today for  Chief Complaint  Patient presents with  . Acute Renal Failure  . Shortness of Breath   at the request of * No referring provider recorded for this case *  Referring Physician(s): Lateef, Munsoor  Patient Status: [facility] - In-pt  History of Present Illness: [name] is a 36 y.o. male with a complex medical history including diastolic heart failure, HTN, chronic kidney disease, morbid obesity and respiratory failure.  Patient has lost hundreds of pounds but still remains morbidly obese and has diffuse muscle wasting and anasacra.  He has required thoracentesis and paracentesis while in hospital.  Renal function has worsened and nephrology has asked IR to evaluate patient for a renal biopsy.  Patient is lying comfortable in bed with nasal cannula.  He says that he cannot lie prone at all and very difficult to lie on side.   He says his belly feels better since recent paracentesis.  Patient has been on IV heparin.    Past Medical History:  Diagnosis Date  . CKD (chronic kidney disease)   . Depression   . Diastolic CHF (HCC)   . Generalized anxiety disorder   . Hyperlipidemia   . Hypertension   . Hypertension   . Hypothyroidism   . Lymphedema   . Obesity   . Pulmonary hypertension (HCC)     Past Surgical History:  Procedure Laterality Date  . CARDIAC CATHETERIZATION    . DIALYSIS/PERMA CATHETER INSERTION N/A 04/02/2017   Procedure: DIALYSIS/PERMA CATHETER INSERTION;  Surgeon: Annice Needy, MD;  Location: [facility] INVASIVE CV LAB;  Service: Cardiovascular;  Laterality: N/A;    Allergies: Patient has no known allergies.  Medications: Prior to Admission medications   Medication Sig Start Date End Date Taking? Authorizing Provider  amLODipine (NORVASC) 10 MG tablet Take 10 mg by mouth daily.   Yes [provider]  amLODipine (NORVASC) 5 MG tablet Take 5 mg by mouth daily.   Yes [provider]    apixaban (ELIQUIS) 5 MG TABS tablet Take 5 mg by mouth 2 (two) times daily.   Yes [provider]  aspirin 325 MG EC tablet Take 325 mg by mouth daily.   Yes [provider]  atorvastatin (LIPITOR) 20 MG tablet Take 20 mg by mouth at bedtime.   Yes [provider]  bisoprolol (ZEBETA) 10 MG tablet Take 10 mg by mouth daily.   Yes [provider]  buPROPion (WELLBUTRIN) 100 MG tablet Take 100 mg by mouth 2 (two) times daily.   Yes [provider]  cloNIDine (CATAPRES) 0.2 MG tablet Take 0.2 mg by mouth 2 (two) times daily.   Yes [provider]  collagenase (SANTYL) ointment Apply 1 application topically daily.   Yes [provider]  hydrALAZINE (APRESOLINE) 50 MG tablet Take 50 mg by mouth 3 (three) times daily.   Yes [provider]  levothyroxine (SYNTHROID, LEVOTHROID) 75 MCG tablet Take 75 mcg by mouth daily before breakfast.   Yes [provider]  metolazone (ZAROXOLYN) 2.5 MG tablet Take 2.5 mg by mouth every other day.   Yes [provider]  nystatin (MYCOSTATIN/NYSTOP) powder Apply topically 2 (two) times daily.   Yes [provider]  potassium chloride SA (K-DUR,KLOR-CON) 20 MEQ tablet Take 20 mEq by mouth every other day.   Yes [provider]  ondansetron (ZOFRAN) 8 MG tablet Take by mouth every 8 (eight) hours as needed for nausea or  vomiting.    [provider]  traMADol (ULTRAM) 50 MG tablet Take 50 mg by mouth every 8 (eight) hours as needed.    [provider]     Family History  Problem Relation Age of Onset  . Hypertension Mother     Social History   Social History  . Marital status: Single    Spouse name: N/A  . Number of children: N/A  . Years of education: N/A   Social History Main Topics  . Smoking status: Passive Smoke Exposure - Never Smoker    Types: Cigarettes  . Smokeless tobacco: Never Used  . Alcohol use No  . Drug use: Unknown   . Sexual activity: Not Asked   Other Topics Concern  . None   Social History Narrative   From [facility]   Has a walker     Review of Systems  Vital Signs: BP 131/75 (BP Location: Left Arm)   Pulse 69   Temp 97.8 F (36.6 C) (Oral)   Resp 20   Ht 5\' 6"  (1.676 m)   Wt (!) 334 lb (151.5 kg)   SpO2 100%   BMI 53.91 kg/m   Physical Exam  Constitutional: No distress.  Abdominal:  Abdomen is very large.  The abdomen is markedly wide and large pannus formations in groin areas.  Skin and soft tissue along the flanks is very tight and firm.        Imaging: Ct Abdomen Pelvis Wo Contrast  Result Date: 03/30/2017 CLINICAL DATA:  Dyspnea an acute renal failure EXAM: CT ABDOMEN AND PELVIS WITHOUT CONTRAST TECHNIQUE: Multidetector CT imaging of the abdomen and pelvis was performed following the standard protocol without IV contrast. COMPARISON:  Radiograph 03/30/2017 FINDINGS: Lower chest: Large bilateral pleural effusions. Partial consolidations within the lower lobes may reflect atelectasis or pneumonia. Partial atelectasis in the lingula. Lower heart does not appear enlarged. Hepatobiliary: No focal hepatic abnormality. No calcified stones or biliary dilatation. Pancreas: Unremarkable. No pancreatic ductal dilatation or surrounding inflammatory changes. Spleen: Normal in size without focal abnormality. Adrenals/Urinary Tract: Adrenal glands within normal limits. Kidneys poorly visible due to large habitus and artifact which obscures the kidneys. No gross hydronephrosis is seen. Bladder poorly visible. Stomach/Bowel: The stomach is nonenlarged. No dilated small or large bowel. Centralized appearance of the bowel. Vascular/Lymphatic: Non aneurysmal aorta. Limited evaluation for adenopathy due to habitus and ascites. Reproductive: Prostate is unremarkable. Other: Negative for free air. Large amount of ascites. Large amount of fluid and edema within the subcutaneous soft tissues.  Musculoskeletal: No acute or suspicious bone lesion IMPRESSION: 1. Large bilateral pleural effusions. Partial consolidations in the lingula and bilateral lower lobes may reflect atelectasis or pneumonia 2. Large amount of ascites in the abdomen and pelvis. Extensive edema and fluid within the subcutaneous soft tissues consistent with anasarca 3. Kidneys poorly visible due to large habitus an artifact. No gross hydronephrosis is seen. Bladder is poorly visible. Electronically Signed   By: Jasmine Pang M.D.   On: 03/30/2017 21:03   Dg Chest 1 View  Result Date: 04/09/2017 CLINICAL DATA:  Status post left thoracentesis EXAM: CHEST 1 VIEW COMPARISON:  04/06/2017 chest radiograph. FINDINGS: Right internal jugular central venous catheter terminates over the right atrium, unchanged. Stable cardiomediastinal silhouette with borderline enlarged cardiopericardial silhouette. No pneumothorax. Small left pleural effusion, significantly decreased. Stable small right pleural effusion. Low lung volumes. Mild pulmonary edema, asymmetric to the right, not appreciably changed. Mild bibasilar atelectasis, decreased on the left and  stable on the right. IMPRESSION: 1. No pneumothorax. 2. Small bilateral pleural effusions, right greater the left, significantly decreased on the left and stable on the right . 3. Mild pulmonary edema, asymmetric to the right, stable. 4. Mild bibasilar atelectasis, decreased on the left and stable on the right. Electronically Signed   By: Delbert Phenix M.D.   On: 04/09/2017 14:02   Dg Chest 1 View  Result Date: 04/02/2017 CLINICAL DATA:  Status post left thoracentesis EXAM: CHEST 1 VIEW COMPARISON:  03/30/2017 FINDINGS: Cardiac shadow remains mildly enlarged. Moderate right pleural effusion is again seen and stable. A large left-sided pleural effusion has resolved in the interval following thoracentesis. Minimal pneumothorax is noted related to incomplete re-expansion of the lung. This should resolve  without difficulty. The patient is currently asymptomatic. IMPRESSION: Resolution of left pleural effusion. Mild pneumothorax likely related to incomplete re-expansion of the left lung. Patient is currently asymptomatic in this should resolve without difficulty. Follow-up film 1 04/03/2017 is recommended for further evaluation. Stable right pleural effusion. Electronically Signed   By: Alcide Clever M.D.   On: 04/02/2017 12:16   US Pelvis Limited  Result Date: 04/08/2017 CLINICAL DATA:  Urinary retention. EXAM: LIMITED ULTRASOUND OF PELVIS TECHNIQUE: Limited transabdominal ultrasound examination of the pelvis was performed to evaluate the urinary bladder. COMPARISON:  None. FINDINGS: Urinary bladder is nondilated, with calculated bladder volume of 134 mL. Poorly defined intraluminal echogenic area is seen, which may be due to artifact, blood clot, or mass. IMPRESSION: Nondilated urinary bladder. Intraluminal echogenic area which may be due to artifact, blood clot, or mass . Recommend follow-up ultrasound with full urinary bladder, or CT for further evaluation. Electronically Signed   By: Myles Rosenthal M.D.   On: 04/08/2017 12:31   US Renal  Result Date: 03/31/2017 CLINICAL DATA:  Evaluate for hydronephrosis. EXAM: RENAL / URINARY TRACT ULTRASOUND COMPLETE COMPARISON:  CT, 03/30/2017 FINDINGS: Right Kidney: Length: 12 cm. Mild increased parenchymal echogenicity. No mass or stone. No hydronephrosis. Left Kidney: Length: 11.1 cm. Normal parenchymal echogenicity. Small hypoechoic lesion in the lateral midpole of the kidney measuring 12 mm, not fully characterize but likely a cyst. No other renal masses, no stones and no hydronephrosis. Bladder: Appears normal for degree of bladder distention. IMPRESSION: 1. No acute findings.  No hydronephrosis. Electronically Signed   By: Amie Portland M.D.   On: 03/31/2017 11:34   US Abdomen Limited  Result Date: 04/08/2017 CLINICAL DATA:  Abdominal distention. EXAM: LIMITED  ABDOMEN ULTRASOUND FOR ASCITES TECHNIQUE: Limited ultrasound survey for ascites was performed in all four abdominal quadrants. COMPARISON:  None. FINDINGS: Large amount of ascites is seen in all 4 abdominal quadrants. IMPRESSION: Large amount of ascites. Electronically Signed   By: Myles Rosenthal M.D.   On: 04/08/2017 12:32   US Venous Img Lower Bilateral  Result Date: 03/31/2017 CLINICAL DATA:  Bilateral lower extremity swelling, for 3 years but worse over the last several months. EXAM: BILATERAL LOWER EXTREMITY VENOUS DOPPLER ULTRASOUND TECHNIQUE: Gray-scale sonography with graded compression, as well as color Doppler and duplex ultrasound were performed to evaluate the lower extremity deep venous systems from the level of the common femoral vein and including the common femoral, femoral, profunda femoral, popliteal and calf veins including the posterior tibial, peroneal and gastrocnemius veins when visible. The superficial great saphenous vein was also interrogated. Spectral Doppler was utilized to evaluate flow at rest and with distal augmentation maneuvers in the common femoral, femoral and popliteal veins. COMPARISON:  None. FINDINGS:  RIGHT LOWER EXTREMITY Common Femoral Vein: There is nonocclusive thrombus in the common femoral vein extending to the sacral femoral junction. Saphenofemoral Junction: Thrombus extends from the common femoral vein to the fat for femoral junction, nonocclusive. Profunda Femoral Vein: No evidence of thrombus. Normal compressibility and flow on color Doppler imaging. Femoral Vein: No evidence of thrombus. Normal compressibility, respiratory phasicity and response to augmentation. Popliteal Vein: No evidence of thrombus. Normal compressibility, respiratory phasicity and response to augmentation. Calf Veins: No evidence of thrombus. Normal compressibility and flow on color Doppler imaging. Superficial Great Saphenous Vein: No evidence of thrombus. Normal compressibility and flow on  color Doppler imaging. Venous Reflux:  None. Other Findings:  None. LEFT LOWER EXTREMITY Common Femoral Vein: No evidence of thrombus. Normal compressibility, respiratory phasicity and response to augmentation. Saphenofemoral Junction: No evidence of thrombus. Normal compressibility and flow on color Doppler imaging. Profunda Femoral Vein: No evidence of thrombus. Normal compressibility and flow on color Doppler imaging. Femoral Vein: No evidence of thrombus. Normal compressibility, respiratory phasicity and response to augmentation. Popliteal Vein: No evidence of thrombus. Normal compressibility, respiratory phasicity and response to augmentation. Calf Veins: No evidence of thrombus. Normal compressibility and flow on color Doppler imaging. Superficial Great Saphenous Vein: No evidence of thrombus. Normal compressibility and flow on color Doppler imaging. Venous Reflux:  None. Other Findings:  None. IMPRESSION: 1. Nonocclusive thrombus in the right common femoral vein extending to the saphenous femoral junction. This may be chronic. 2. No other evidence of deep venous thrombosis in the right lower extremity. 3. No evidence of left lower extremity deep venous thrombosis. Electronically Signed   By: Amie Portland M.D.   On: 03/31/2017 11:36   US Paracentesis  Result Date: 04/11/2017 INDICATION: Large volume ascites EXAM: ULTRASOUND GUIDED  PARACENTESIS MEDICATIONS: None. COMPLICATIONS: None immediate. PROCEDURE: Informed written consent was obtained from the patient after a discussion of the risks, benefits and alternatives to treatment. A timeout was performed prior to the initiation of the procedure. Initial ultrasound scanning demonstrates a large amount of ascites within the right abdomen. The right abdomen was prepped and draped in the usual sterile fashion. 1% lidocaine was used for local anesthesia. Following this, a 6 Fr Safe-T-Centesis catheter was introduced. An ultrasound image was saved for  documentation purposes. The paracentesis was performed. The catheter was removed and a dressing was applied. The patient tolerated the procedure well without immediate post procedural complication. FINDINGS: A total of approximately 5 L of clear yellow fluid was removed. Samples were sent to the laboratory as requested by the clinical team. IMPRESSION: Successful ultrasound-guided paracentesis yielding 5 liters of peritoneal fluid. Electronically Signed   By: Alcide Clever M.D.   On: 04/11/2017 12:48   Dg Chest Port 1 View  Result Date: 04/06/2017 CLINICAL DATA:  Cough EXAM: PORTABLE CHEST 1 VIEW COMPARISON:  04/04/2017 FINDINGS: Moderate to large left pleural effusion with apical capping, increased. Small to moderate right pleural effusion, grossly unchanged. Cardiomegaly. Right IJ venous catheter terminates in the right atrium. Mild interstitial edema is suspected. No pneumothorax. IMPRESSION: Cardiomegaly with mild interstitial edema. Bilateral pleural effusions, moderate to large on the left, increased. Electronically Signed   By: Charline Bills M.D.   On: 04/06/2017 23:17   Dg Chest Port 1 View  Result Date: 04/04/2017 CLINICAL DATA:  Follow-up effusion EXAM: PORTABLE CHEST 1 VIEW COMPARISON:  04/03/2017 FINDINGS: Cardiac shadow remains enlarged. Temporary dialysis catheter is again noted on the right. Large bilateral pleural effusions are again seen increasing  on the left. No pneumothorax component is identified at this time. Central vascular congestion is noted. No acute bony abnormality is seen. IMPRESSION: Bilateral pleural effusions increasing on the left. Electronically Signed   By: Alcide CleverMark  Lukens M.D.   On: 04/04/2017 07:16   Dg Chest Port 1 View  Result Date: 04/03/2017 CLINICAL DATA:  Respiratory failure. EXAM: PORTABLE CHEST 1 VIEW COMPARISON:  04/02/2017. FINDINGS: Right IJ line in stable position. Heart size stable. Diffuse bilateral pulmonary interstitial prominence and bilateral  pleural effusions, particular prominent on right again noted suggesting CHF. Left-sided pneumothorax again noted. Slight progression from prior exam cannot be excluded. No acute bony abnormality. IMPRESSION: 1. Left-sided pneumothorax again noted. Slight progression from prior exam cannot be excluded. 2. Diffuse bilateral interstitial prominence and bilateral pleural effusions, particularly prominent on the right again noted. 3. Right IJ line in unchanged position. Electronically Signed   By: Maisie Fushomas  Register   On: 04/03/2017 06:07   Dg Chest Port 1 View  Result Date: 04/02/2017 CLINICAL DATA:  Respiratory failure. EXAM: PORTABLE CHEST 1 VIEW COMPARISON:  Radiograph of April 02, 2017. FINDINGS: Stable cardiomediastinal silhouette. Stable mild left pneumothorax is noted. Mild bilateral perihilar and basilar opacities are noted concerning for edema or atelectasis. Mild bilateral pleural effusions are noted as well. Interval placement of right internal jugular dialysis catheter with distal tip in expected position of right atrium. Bony thorax is unremarkable. IMPRESSION: Stable mild left pneumothorax. Stable bilateral perihilar and basilar opacities are noted concerning for edema or atelectasis with mild pleural effusions. Interval placement of right internal jugular dialysis catheter with distal tip in expected position of right atrium. Electronically Signed   By: Lupita RaiderJames  Green Jr, M.D.   On: 04/02/2017 20:58   Dg Chest Port 1 View  Result Date: 04/02/2017 CLINICAL DATA:  Increasing respiratory needs and hypoxia EXAM: PORTABLE CHEST 1 VIEW COMPARISON:  Film from earlier in the same day FINDINGS: Small left pneumothorax is again identified and unchanged given a change in patient positioning. Again this is felt to be related to incomplete re-expansion of the left lung. There is now however new parenchymal density identified in the mid and lower portion of the left lung consistent with re-expansion edema.  Persistent large right pleural effusion is noted. Cardiac shadow is stable. Bony structures are stable. IMPRESSION: Changes of re-expansion edema within the left lung. Stable nontraumatic left pneumothorax. Electronically Signed   By: Alcide CleverMark  Lukens M.D.   On: 04/02/2017 14:50   Dg Chest Portable 1 View  Result Date: 03/30/2017 CLINICAL DATA:  Shortness of breath and chest pain EXAM: PORTABLE CHEST 1 VIEW COMPARISON:  None. FINDINGS: Mild cardiomegaly. Large left pleural effusion with components along the lateral left hemithorax. Associated atelectasis. Right lung is clear. IMPRESSION: Mild cardiomegaly and large left pleural effusion. Electronically Signed   By: Deatra RobinsonKevin  Herman M.D.   On: 03/30/2017 18:22   [place] Thoracentesis Asp Pleural Space W/img Guide  Result Date: 04/09/2017 CLINICAL DATA:  Recurrent left pleural effusion and respiratory failure. EXAM: ULTRASOUND GUIDED LEFT THORACENTESIS COMPARISON:  04/02/2017 PROCEDURE: An ultrasound guided thoracentesis was thoroughly discussed with the patient and questions answered. The benefits, risks, alternatives and complications were also discussed. The patient understands and wishes to proceed with the procedure. Written consent was obtained. A time-out was performed prior to performing the procedure. Ultrasound was performed to localize and mark an adequate pocket of fluid in the left chest. The area was then prepped and draped in the normal sterile fashion. 1% Lidocaine  was used for local anesthesia. Under ultrasound guidance a 6 French Safe-T-Centesis catheter was introduced. Thoracentesis was performed. The catheter was removed and a dressing applied. COMPLICATIONS: None FINDINGS: A total of approximately 2 L of clear, yellow fluid was removed. IMPRESSION: Successful ultrasound guided left thoracentesis yielding 2 L of pleural fluid. Electronically Signed   By: Irish Lack M.D.   On: 04/09/2017 12:54   US Thoracentesis Asp Pleural Space W/img  Guide  Result Date: 04/02/2017 INDICATION: Bilateral pleural effusions EXAM: ULTRASOUND GUIDED LEFT THORACENTESIS MEDICATIONS: None. COMPLICATIONS: None immediate. PROCEDURE: An ultrasound guided thoracentesis was thoroughly discussed with the patient and questions answered. The benefits, risks, alternatives and complications were also discussed. The patient understands and wishes to proceed with the procedure. Written consent was obtained. Ultrasound was performed to localize and mark an adequate pocket of fluid in the left chest. The area was then prepped and draped in the normal sterile fashion. 1% Lidocaine was used for local anesthesia. Under ultrasound guidance, a 6 Fr Safe-T-Centesis catheter was introduced. Thoracentesis was performed. The catheter was removed and a dressing applied. FINDINGS: A total of approximately 2.8 L of clear yellow fluid was removed. Samples were sent to the laboratory as requested by the clinical team. IMPRESSION: Successful ultrasound guided left thoracentesis yielding 2.8 L of pleural fluid. Electronically Signed   By: Alcide Clever M.D.   On: 04/02/2017 12:17    Labs:  CBC:  Recent Labs  04/06/17 0402 04/07/17 0504 04/11/17 0500 04/12/17 0421  WBC 14.3* 12.3* 11.3* 12.3*  HGB 8.6* 8.3* 6.8* 8.9*  HCT 27.7* 26.4* 21.8* 27.9*  PLT 484* 432 358 393    COAGS:  Recent Labs  03/30/17 1749 04/01/17 0332 04/11/17 1634 04/12/17 0421 04/12/17 1230  INR 1.30 1.46 1.14  --   --   APTT 47*  --  40* 67* 67*    BMP:  Recent Labs  04/06/17 0402 04/07/17 0504 04/11/17 0500 04/12/17 0421  NA 143 139 142 141  K 4.4 4.5 3.7 3.9  CL 106 103 102 102  CO2 26 24 31 31   GLUCOSE 91 83 97 99  BUN 81* 87* 53* 58*  CALCIUM 7.4* 7.3* 7.2* 7.5*  CREATININE 5.80* 6.09* 4.28* 4.74*  GFRNONAA 12* 11* 17* 15*  GFRAA 13* 13* 19* 17*    LIVER FUNCTION TESTS:  Recent Labs  03/30/17 1749 04/01/17 0332  BILITOT 0.5 0.5  AST 13* 11*  ALT 15* 13*  ALKPHOS 88  70  PROT 7.4 6.7  ALBUMIN 1.7* 1.6*    TUMOR MARKERS: No results for input(s): AFPTM, CEA, CA199, CHROMGRNA in the last 8760 hours.  Assessment and Plan:  36 yo with multiple medical problems including chronic kidney disease.  Image guided renal biopsy will be very difficult with this patient due to his body habitus and may not be technically feasible.  In addition, I would be very concerned about managing any bleeding complication after the biopsy.  Patient would likely need to be off anticoagulation for at least 24 hours after a renal biopsy.   Discussed with Dr. Cherylann Ratel and feel that the risks of a renal biopsy at this time are too high and will not proceed with biopsy.   Thank you for this interesting consult.  I greatly enjoyed meeting [name] and look forward to participating in their care.  A copy of this report was sent to the requesting provider on this date.  Electronically Signed: Abundio Miu, MD 04/13/2017, 9:20 AM  I spent a total of 20 Minutes    in face to face in clinical consultation, greater than 50% of which was counseling/coordinating care for a renal biopsy.

## 2017-04-13 NOTE — Progress Notes (Signed)
ANTICOAGULATION CONSULT NOTE   Pharmacy Consult for heparin drip Indication: vte treatment - on apixaban PTA No Known Allergies  Patient Measurements: Height: 5\' 6"  (167.6 cm) Weight:  (uta- bed scale broken) IBW/kg (Calculated) : 63.8 Heparin Dosing Weight: 101  Vital Signs: Temp: 97.8 F (36.6 C) (08/31 0516) Temp Source: Oral (08/31 0516) BP: 131/75 (08/31 0516) Pulse Rate: 69 (08/31 0516)  Labs:  Recent Labs  04/11/17 0500 04/11/17 1634 04/12/17 0421 04/12/17 1230 04/12/17 2044  HGB 6.8*  --  8.9*  --   --   HCT 21.8*  --  27.9*  --   --   PLT 358  --  393  --   --   APTT  --  40* 67* 67*  --   LABPROT  --  14.5  --   --   --   INR  --  1.14  --   --   --   HEPARINUNFRC  --   --  0.80*  --  0.51  CREATININE 4.28*  --  4.74*  --   --   CKTOTAL  --  32*  --   --   --     Estimated Creatinine Clearance: 30.7 mL/min (A) (by C-G formula based on SCr of 4.74 mg/dL (H)).   Medical History: Past Medical History:  Diagnosis Date  . CKD (chronic kidney disease)   . Depression   . Diastolic CHF (HCC)   . Generalized anxiety disorder   . Hyperlipidemia   . Hypertension   . Hypertension   . Hypothyroidism   . Lymphedema   . Obesity   . Pulmonary hypertension (HCC)     Medications:  Scheduled:  . amLODipine  5 mg Oral Daily  . bisoprolol  10 mg Oral Daily  . buPROPion  100 mg Oral BID  . collagenase   Topical Daily  . docusate sodium  100 mg Oral BID  . feeding supplement (NEPRO CARB STEADY)  237 mL Oral TID BM  . ferrous sulfate  325 mg Oral Q1200  . irbesartan  300 mg Oral QHS  . levothyroxine  75 mcg Oral QAC breakfast  . multivitamin  1 tablet Oral QHS   Infusions:  . sodium chloride    . albumin human    . 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 Starting gtt at 2200 because am dose of apixaban was given at 1000 8/29.  Will need to follow aptt until levels correlate.   Goal of Therapy:  APTT 66-102 Heparin level 0.3-0.7 units/ml Monitor platelets by anticoagulation protocol: Yes   Plan:   HL=67 Therapeutic, however dose was increased to 1700 units/hr and level remain at 67. Therefore, I will increase to 1800 units/hr. Check level in 6 hours  8/31: Heparin drip was stopped for patient to have kidney biopsy. Per Radiology note:  Discussed with Dr. Cherylann RatelLateef and feel that the risks of a renal biopsy at this time are too high and will not proceed with biopsy.  Will restart Heparin drip at 1800 units/hr, F/u aPTT and Heparin level at 1600. Last dose of apixaban on 8/29 @ 1000.  Follow aPTTs until aPTT and Heparin levels correlate.  Bari MantisKristin Bernd Crom PharmD Clinical Pharmacist 04/13/2017

## 2017-04-13 NOTE — Progress Notes (Signed)
[facility]  ROUNDING NOTE   Subjective:  Patient was being considered for renal biopsy. We consulted with interventional radiology. At this time they feel the risks outweigh the benefits. Biopsy would be technically challenging to perform. Urine output was 1 L yesterday. Patient also had dialysis yesterday.    Objective:  Vital signs in last 24 hours:  Temp:  [97.8 F (36.6 C)-98.2 F (36.8 C)] 97.8 F (36.6 C) (08/31 0516) Pulse Rate:  [62-82] 70 (08/31 1008) Resp:  [15-20] 20 (08/31 0516) BP: (130-156)/(68-94) 145/78 (08/31 1008) SpO2:  [96 %-100 %] 100 % (08/31 0516)  Weight change:  Filed Weights    Intake/Output: I/O last 3 completed shifts: In: 800 [P.O.:180; I.V.:620] Out: 2850 [Urine:1850; Other:1000]   Intake/Output this shift:  Total I/O In: -  Out: 400 [Urine:400]  Physical Exam: General: Chronically ill-appearing, laying in bed  Head: +temporal wasting  Eyes: Anicteric  Neck: supple  Lungs:  Diminished bilaterally, Normal effort   Heart: S1S2 no rubs  Abdomen:  Soft, nontender, obese, pannus noted  Extremities: 2+ b/l LE edema  Neurologic: significant generalized muscle wasting  Skin: Prurigo lesions over legs and thighs, dressing on thighs  Access: Rt IJ temp cath    Basic Metabolic Panel:  Recent Labs Lab 04/07/17 0504 04/10/17 1009 04/11/17 0500 04/12/17 0421  NA 139  --  142 141  K 4.5  --  3.7 3.9  CL 103  --  102 102  CO2 24  --  31 31  GLUCOSE 83  --  97 99  BUN 87*  --  53* 58*  CREATININE 6.09*  --  4.28* 4.74*  CALCIUM 7.3*  --  7.2* 7.5*  MG  --   --  1.8  --   PHOS  --  6.5* 5.3*  --     Liver Function Tests: No results for input(s): AST, ALT, ALKPHOS, BILITOT, PROT, ALBUMIN in the last 168 hours. No results for input(s): LIPASE, AMYLASE in the last 168 hours. No results for input(s): AMMONIA in the last 168 hours.  CBC:  Recent Labs Lab 04/07/17 0504 04/11/17 0500 04/12/17 0421  WBC 12.3* 11.3*  12.3*  HGB 8.3* 6.8* 8.9*  HCT 26.4* 21.8* 27.9*  MCV 83.5 83.1 82.9  PLT 432 358 393    Cardiac Enzymes:  Recent Labs Lab 04/11/17 1634  CKTOTAL 32*    BNP: Invalid input(s): POCBNP  CBG:  Recent Labs Lab 04/06/17 2201 04/06/17 2246  GLUCAP 98 95    Microbiology: Results for orders placed or performed during the hospital encounter of 03/30/17  Aerobic/Anaerobic Culture (surgical/deep wound)     Status: Abnormal   Collection Time: 03/30/17  7:56 PM  Result Value Ref Range Status   Specimen Description WOUND LEFT THIGH  Final   Special Requests NONE  Final   Gram Stain   Final    ABUNDANT WBC PRESENT, PREDOMINANTLY PMN ABUNDANT GRAM POSITIVE COCCI ABUNDANT GRAM NEGATIVE RODS MODERATE GRAM POSITIVE RODS MODERATE GRAM NEGATIVE COCCI    Culture (A)  Final    MULTIPLE ORGANISMS PRESENT, NONE PREDOMINANT NO ANAEROBES ISOLATED Performed at [facility], [address], [place], [place] [address]    Report Status 04/05/2017 FINAL  Final  MRSA PCR Screening     Status: Abnormal   Collection Time: 03/30/17  8:58 PM  Result Value Ref Range Status   MRSA by PCR POSITIVE (A) NEGATIVE Final    Comment:  The GeneXpert MRSA Assay (FDA approved for NASAL specimens only), is one component of a comprehensive MRSA colonization surveillance program. It is not intended to diagnose MRSA infection nor to guide or monitor treatment for MRSA infections. RESULT CALLED TO, READ BACK BY AND VERIFIED WITH: KARA CAMPBELL AT 2230 03/30/17.PMH   Body fluid culture     Status: None   Collection Time: 04/02/17 11:50 AM  Result Value Ref Range Status   Specimen Description PLEURAL  Final   Special Requests NONE  Final   Gram Stain NO WBC SEEN NO ORGANISMS SEEN   Final   Culture   Final    NO GROWTH 3 DAYS Performed at [facility], [address] [place], [place] [address]    Report Status 04/05/2017 FINAL  Final  Acid Fast Smear (AFB)     Status: None    Collection Time: 04/02/17 11:50 AM  Result Value Ref Range Status   AFB Specimen Processing Concentration  Final   Acid Fast Smear Negative  Final    Comment: (NOTE) Performed At: [facility] [address] [place], [place] [id number] Mila Homer MD [id number]    Source (AFB) PLEURAL  Final    Coagulation Studies:  Recent Labs  04/11/17 1634  LABPROT 14.5  INR 1.14    Urinalysis: No results for input(s): COLORURINE, LABSPEC, PHURINE, GLUCOSEU, HGBUR, BILIRUBINUR, KETONESUR, PROTEINUR, UROBILINOGEN, NITRITE, LEUKOCYTESUR in the last 72 hours.  Invalid input(s): APPERANCEUR    Imaging: US Paracentesis  Result Date: 04/11/2017 INDICATION: Large volume ascites EXAM: ULTRASOUND GUIDED  PARACENTESIS MEDICATIONS: None. COMPLICATIONS: None immediate. PROCEDURE: Informed written consent was obtained from the patient after a discussion of the risks, benefits and alternatives to treatment. A timeout was performed prior to the initiation of the procedure. Initial ultrasound scanning demonstrates a large amount of ascites within the right abdomen. The right abdomen was prepped and draped in the usual sterile fashion. 1% lidocaine was used for local anesthesia. Following this, a 6 Fr Safe-T-Centesis catheter was introduced. An ultrasound image was saved for documentation purposes. The paracentesis was performed. The catheter was removed and a dressing was applied. The patient tolerated the procedure well without immediate post procedural complication. FINDINGS: A total of approximately 5 L of clear yellow fluid was removed. Samples were sent to the laboratory as requested by the clinical team. IMPRESSION: Successful ultrasound-guided paracentesis yielding 5 liters of peritoneal fluid. Electronically Signed   By: Alcide Clever M.D.   On: 04/11/2017 12:48     Medications:   . albumin human    . heparin 1,800 Units/hr (04/13/17 1025)   . amLODipine  5 mg Oral Daily  .  bisoprolol  10 mg Oral Daily  . buPROPion  100 mg Oral BID  . collagenase   Topical Daily  . docusate sodium  100 mg Oral BID  . feeding supplement (NEPRO CARB STEADY)  237 mL Oral TID BM  . ferrous sulfate  325 mg Oral Q1200  . irbesartan  300 mg Oral QHS  . levothyroxine  75 mcg Oral QAC breakfast  . multivitamin  1 tablet Oral QHS   acetaminophen **OR** [DISCONTINUED] acetaminophen, albumin human, hydrALAZINE, ipratropium-albuterol, lip balm, LORazepam, morphine injection, [DISCONTINUED] ondansetron **OR** ondansetron (ZOFRAN) IV, traMADol  Assessment/ Plan:  [name] is a 36 y.o. white male with hypertension, morbid obesity, pulmonary hypertension, hypothyroidism, hyperlipidemia, depression/anxiety, coronary artery disease, congestive heart failure, who was admitted to [facility] on 03/30/2017  1. Acute renal failure on  chronic kidney disease stage III with nephrotic range proteinuria, hematuria and glucosuria No baseline creatinine available. Creatinine of 1.3 in 12/2014 (care everywhere).  Concern for progression of chronic kidney disease to CKD stage V.  Patient has Nephrotic syndrome of unknown etiology. He states he has been dealing with edema for over 2-3 yeas but did not seek medical care. Renal ultrasound report reviewed. HIV negative Not a candidate for kidney biopsy due to body habitus/ in ability to lie prone/ current infection  - 1st HD on 8/20; PPD has been placed on 8/20 -  This continues to be a very challenging case. We consulted with interventional radiology to see if renal biopsy could be performed. They agreed with our prior assessment that renal biopsy would be technically challenging and extremely difficult for the patient to perform given his inability to lie in a prone position or on his side given his large pannus. Therefore renal biopsy at this time will be deferred. Transfer to a tertiary care center could be considered. For now we will plan for dialysis again  tomorrow.  2. Anasarca, with HTN. Echocardiogram with normal systolic function 8/18. No visualization of hepatic cirrhosis on CT abd/pelvis -  We will plan for ongoing ultrafiltration with dialysis tomorrow.  3. Hyperphosphatemia.   PTH 60  - Phosphorus down to 5.3 at last check. Recheck serum phosphorus with next dialysis treatment.  4. Anemia of chronic kidney disease: Patient has required blood transfusion this week. Continue to monitor hemoglobin.  5. Generalized weakness with muscle wasting Neurology evaluation in progress - hold statin - Appreciate rheumatology input. Patient needs deltoid muscle biopsy, however it appears that this cannot be done here.  6. B/l pleural effusion -  Thoracentesis performed 04/09/2017.    LOS: 14 Corie Vavra 8/31/201812:05 PM

## 2017-04-14 LAB — CBC
HCT: 32.5 % — ABNORMAL LOW (ref 40.0–52.0)
HEMATOCRIT: 28.1 % — AB (ref 40.0–52.0)
HEMOGLOBIN: 10.3 g/dL — AB (ref 13.0–18.0)
Hemoglobin: 9.1 g/dL — ABNORMAL LOW (ref 13.0–18.0)
MCH: 26.8 pg (ref 26.0–34.0)
MCH: 27 pg (ref 26.0–34.0)
MCHC: 32.3 g/dL (ref 32.0–36.0)
MCHC: 31.7 g/dL — AB (ref 32.0–36.0)
MCV: 85.2 fL (ref 80.0–100.0)
MCV: 82.9 fL (ref 80.0–100.0)
Platelets: 418 10*3/uL (ref 150–440)
Platelets: 405 10*3/uL (ref 150–440)
RBC: 3.81 MIL/uL — ABNORMAL LOW (ref 4.40–5.90)
RBC: 3.39 MIL/uL — ABNORMAL LOW (ref 4.40–5.90)
RDW: 15.3 % — AB (ref 11.5–14.5)
RDW: 15.9 % — AB (ref 11.5–14.5)
WBC: 11 10*3/uL — ABNORMAL HIGH (ref 3.8–10.6)
WBC: 9.1 10*3/uL (ref 3.8–10.6)

## 2017-04-14 LAB — HEPARIN LEVEL (UNFRACTIONATED)
Heparin Unfractionated: 0.41 IU/mL (ref 0.30–0.70)
Heparin Unfractionated: 0.32 IU/mL (ref 0.30–0.70)

## 2017-04-14 LAB — APTT
APTT: 72 s — AB (ref 24–36)
aPTT: 82 seconds — ABNORMAL HIGH (ref 24–36)

## 2017-04-14 LAB — PHOSPHORUS: Phosphorus: 5.1 mg/dL — ABNORMAL HIGH (ref 2.5–4.6)

## 2017-04-14 NOTE — Progress Notes (Signed)
PRE DIALYSIS ASSESSMENT 

## 2017-04-14 NOTE — Progress Notes (Signed)
[facility]  ROUNDING NOTE   Subjective:  Patient seen at bedside. He is due for hemodialysis today. Next line urine output over the preceding 24 hours was 1.2 L.  Objective:  Vital signs in last 24 hours:  Temp:  [97.3 F (36.3 C)-98 F (36.7 C)] 97.5 F (36.4 C) (09/01 0457) Pulse Rate:  [65-69] 65 (09/01 0457) Resp:  [16-20] 20 (09/01 0457) BP: (129-161)/(77-89) 129/77 (09/01 0457) SpO2:  [97 %-100 %] 100 % (09/01 0457)  Weight change:  Filed Weights    Intake/Output: I/O last 3 completed shifts: In: 1587.4 [P.O.:1020; I.V.:567.4] Out: 1550 [Urine:1550]   Intake/Output this shift:  Total I/O In: -  Out: 150 [Urine:150]  Physical Exam: General: Chronically ill-appearing, laying in bed  Head: +temporal wasting  Eyes: Anicteric  Neck: supple  Lungs:  Diminished bilaterally, Normal effort   Heart: S1S2 no rubs  Abdomen:  Soft, nontender, obese, pannus noted  Extremities: 2+ b/l LE edema  Neurologic: significant generalized muscle wasting  Skin: Prurigo lesions over legs and thighs, dressing on thighs  Access: Rt IJ temp cath    Basic Metabolic Panel:  Recent Labs Lab 04/10/17 1009 04/11/17 0500 04/12/17 0421  NA  --  142 141  K  --  3.7 3.9  CL  --  102 102  CO2  --  31 31  GLUCOSE  --  97 99  BUN  --  53* 58*  CREATININE  --  4.28* 4.74*  CALCIUM  --  7.2* 7.5*  MG  --  1.8  --   PHOS 6.5* 5.3*  --     Liver Function Tests: No results for input(s): AST, ALT, ALKPHOS, BILITOT, PROT, ALBUMIN in the last 168 hours. No results for input(s): LIPASE, AMYLASE in the last 168 hours. No results for input(s): AMMONIA in the last 168 hours.  CBC:  Recent Labs Lab 04/11/17 0500 04/12/17 0421 04/14/17 0031 04/14/17 0614  WBC 11.3* 12.3* 11.0* 9.1  HGB 6.8* 8.9* 10.3* 9.1*  HCT 21.8* 27.9* 32.5* 28.1*  MCV 83.1 82.9 85.2 82.9  PLT 358 393 418 405    Cardiac Enzymes:  Recent Labs Lab 04/11/17 1634  CKTOTAL 32*     BNP: Invalid input(s): POCBNP  CBG: No results for input(s): GLUCAP in the last 168 hours.  Microbiology: Results for orders placed or performed during the hospital encounter of 03/30/17  Aerobic/Anaerobic Culture (surgical/deep wound)     Status: Abnormal   Collection Time: 03/30/17  7:56 PM  Result Value Ref Range Status   Specimen Description WOUND LEFT THIGH  Final   Special Requests NONE  Final   Gram Stain   Final    ABUNDANT WBC PRESENT, PREDOMINANTLY PMN ABUNDANT GRAM POSITIVE COCCI ABUNDANT GRAM NEGATIVE RODS MODERATE GRAM POSITIVE RODS MODERATE GRAM NEGATIVE COCCI    Culture (A)  Final    MULTIPLE ORGANISMS PRESENT, NONE PREDOMINANT NO ANAEROBES ISOLATED Performed at [facility], [address], [place], [place] [address]    Report Status 04/05/2017 FINAL  Final  MRSA PCR Screening     Status: Abnormal   Collection Time: 03/30/17  8:58 PM  Result Value Ref Range Status   MRSA by PCR POSITIVE (A) NEGATIVE Final    Comment:        The GeneXpert MRSA Assay (FDA approved for NASAL specimens only), is one component of a comprehensive MRSA colonization surveillance program. It is not intended to diagnose MRSA infection nor to guide or monitor  treatment for MRSA infections. RESULT CALLED TO, READ BACK BY AND VERIFIED WITH: KARA CAMPBELL AT 2230 03/30/17.PMH   Body fluid culture     Status: None   Collection Time: 04/02/17 11:50 AM  Result Value Ref Range Status   Specimen Description PLEURAL  Final   Special Requests NONE  Final   Gram Stain NO WBC SEEN NO ORGANISMS SEEN   Final   Culture   Final    NO GROWTH 3 DAYS Performed at [facility], [address] [place], [place] [address]    Report Status 04/05/2017 FINAL  Final  Acid Fast Smear (AFB)     Status: None   Collection Time: 04/02/17 11:50 AM  Result Value Ref Range Status   AFB Specimen Processing Concentration  Final   Acid Fast Smear Negative  Final    Comment:  (NOTE) Performed At: [facility] [address] [place], [place] [id number] Mila HomerHancock William F MD [id number]:[phone]    Source (AFB) PLEURAL  Final    Coagulation Studies:  Recent Labs  04/11/17 1634  LABPROT 14.5  INR 1.14    Urinalysis: No results for input(s): COLORURINE, LABSPEC, PHURINE, GLUCOSEU, HGBUR, BILIRUBINUR, KETONESUR, PROTEINUR, UROBILINOGEN, NITRITE, LEUKOCYTESUR in the last 72 hours.  Invalid input(s): APPERANCEUR    Imaging: No results found.   Medications:   . albumin human    . heparin 1,900 Units/hr (04/13/17 2227)   . amLODipine  5 mg Oral Daily  . bisoprolol  10 mg Oral Daily  . buPROPion  100 mg Oral BID  . collagenase   Topical Daily  . docusate sodium  100 mg Oral BID  . feeding supplement (NEPRO CARB STEADY)  237 mL Oral TID BM  . ferrous sulfate  325 mg Oral Q1200  . irbesartan  300 mg Oral QHS  . levothyroxine  75 mcg Oral QAC breakfast  . multivitamin  1 tablet Oral QHS   acetaminophen **OR** [DISCONTINUED] acetaminophen, albumin human, hydrALAZINE, ipratropium-albuterol, lip balm, LORazepam, morphine injection, [DISCONTINUED] ondansetron **OR** ondansetron (ZOFRAN) IV, traMADol  Assessment/ Plan:  [name] is a 36 y.o. white male with hypertension, morbid obesity, pulmonary hypertension, hypothyroidism, hyperlipidemia, depression/anxiety, coronary artery disease, congestive heart failure, who was admitted to [facility] on 03/30/2017  1. Acute renal failure on chronic kidney disease stage III with nephrotic range proteinuria, hematuria and glucosuria No baseline creatinine available. Creatinine of 1.3 in 12/2014 (care everywhere).  Concern for progression of chronic kidney disease to CKD stage V.  Patient has Nephrotic syndrome of unknown etiology. He states he has been dealing with edema for over 2-3 yeas but did not seek medical care. Renal ultrasound report reviewed. HIV negative Not a candidate for kidney biopsy due to body  habitus/ in ability to lie prone/ current infection, patient was also evaluated by interventional radiology for renal biopsy, they also felt that risks outweigh the benefits in terms of renal biopsy and that renal biopsy would be technically very difficult.  - 1st HD on 8/20; PPD placed on 8/20 -  Patient due for hemodialysis today. Orders have been prepared. Ultrafiltration target 1.5 kg.  2. Anasarca, with HTN. Echocardiogram with normal systolic function 8/18. No visualization of hepatic cirrhosis on CT abd/pelvis -  As above ultrafiltration target 1.5 kg.  3. Hyperphosphatemia.   PTH 60  - Check serum phosphorus with dialysis today.  4. Anemia of chronic kidney disease: Hemoglobin has fluctuated a bit. Currently hemoglobin 9.1.  5. Generalized weakness with muscle wasting  Neurology evaluation in progress - hold statin - Appreciate rheumatology input. Patient needs deltoid muscle biopsy, however it appears that this cannot be done here, patient being transferred to [facility] for this.  6. B/l pleural effusion -  Thoracentesis performed 04/09/2017.    LOS: 15 Indria Bishara 9/1/201810:49 AM

## 2017-04-14 NOTE — Progress Notes (Signed)
ANTICOAGULATION CONSULT NOTE   Pharmacy Consult for heparin drip Indication: vte treatment - on apixaban PTA No Known Allergies  Patient Measurements: Height: 5\' 6"  (167.6 cm) Weight:  (uta- bed scale broken) IBW/kg (Calculated) : 63.8 Heparin Dosing Weight: 101  Vital Signs: Temp: 97.3 F (36.3 C) (08/31 2014) Temp Source: Oral (08/31 2014) BP: 161/89 (08/31 2014) Pulse Rate: 66 (08/31 2014)  Labs:  Recent Labs  04/11/17 0500  04/11/17 1634  04/12/17 0421 04/12/17 1230 04/12/17 2044 04/13/17 1559 04/14/17 0031  HGB 6.8*  --   --   --  8.9*  --   --   --  10.3*  HCT 21.8*  --   --   --  27.9*  --   --   --  32.5*  PLT 358  --   --   --  393  --   --   --  418  APTT  --   < > 40*  --  67* 67*  --  54* 72*  LABPROT  --   --  14.5  --   --   --   --   --   --   INR  --   --  1.14  --   --   --   --   --   --   HEPARINUNFRC  --   --   --   < > 0.80*  --  0.51 0.26* 0.41  CREATININE 4.28*  --   --   --  4.74*  --   --   --   --   CKTOTAL  --   --  32*  --   --   --   --   --   --   < > = values in this interval not displayed.  Estimated Creatinine Clearance: 30.7 mL/min (A) (by C-G formula based on SCr of 4.74 mg/dL (H)).   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 Starting gtt at 2200 because am dose of apixaban was given at 1000 8/29.  Will need to follow aptt until levels correlate.   Goal of Therapy:  APTT 66-102 Heparin level 0.3-0.7 units/ml Monitor platelets by anticoagulation protocol: Yes   Plan:   HL=67 Therapeutic, however dose was increased to 1700 units/hr and level remain at 67. Therefore, I will increase to 1800 units/hr. Check level in 6 hours  8/31: Heparin drip was stopped for patient to have kidney biopsy. Per Radiology note:  Discussed with Dr. Cherylann RatelLateef and feel that the risks of a renal biopsy at this time are too high and will not proceed with biopsy.  Will  restart Heparin drip at 1800 units/hr, F/u aPTT and Heparin level at 1600. Last dose of apixaban on 8/29 @ 1000.  Follow aPTTs until aPTT and Heparin levels correlate.  8/31 PM: Per RN, heparin drip running at 18 ml/hr. No line issues, no s/sx of bleeding noted. No CBC today. Heparin level 0.26, aPTT 54 - will increase drip to 1900 units/hr (=19 ml/hr). HL/aPTT in 6 h. Will also get CBC with next set of levels.   9/1 0031: Heparin level 0.41 and aPTT 72 are both in range. Will continue with current rate and recheck confirmatory levels in 6 hours.  Pharmacy will continue to follow.   Clovia CuffLisa Jacy Brocker, PharmD, BCPS 04/14/2017 1:40 AM

## 2017-04-14 NOTE — Progress Notes (Signed)
HD COMPLETED  

## 2017-04-14 NOTE — Progress Notes (Signed)
SLP Cancellation Note  Patient Details Name: Nils FlackJoseph Brassfield MRN: [id number] DOB: 31-Aug-1980   Cancelled treatment:       Reason Eval/Treat Not Completed: Patient at procedure or test/unavailable (chart reviewed; pt currently receiving dialysis). No reports of overt s/s of aspiration per NSG w/ meals; pt is on a regular diet per chart. Noted no decline in wbc, temp, o2 status'.  ST services will f/u as ordered; recommend aspiration precautions during any oral intake.    Jerilynn SomKatherine Fitzhugh Vizcarrondo, MS, CCC-SLP Landon Bassford 04/14/2017, 1:37 PM

## 2017-04-14 NOTE — Progress Notes (Signed)
ANTICOAGULATION CONSULT NOTE   Pharmacy Consult for heparin drip Indication: vte treatment - on apixaban PTA No Known Allergies  Patient Measurements: Height: 5\' 6"  (167.6 cm) Weight:  (uta- bed scale broken) IBW/kg (Calculated) : 63.8 Heparin Dosing Weight: 101  Vital Signs: Temp: 97.5 F (36.4 C) (09/01 0457) Temp Source: Oral (09/01 0457) BP: 129/77 (09/01 0457) Pulse Rate: 65 (09/01 0457)  Labs:  Recent Labs  04/11/17 1634  04/12/17 0421  04/13/17 1559 04/14/17 0031 04/14/17 0614  HGB  --   < > 8.9*  --   --  10.3* 9.1*  HCT  --   --  27.9*  --   --  32.5* 28.1*  PLT  --   --  393  --   --  418 405  APTT 40*  --  67*  < > 54* 72* 82*  LABPROT 14.5  --   --   --   --   --   --   INR 1.14  --   --   --   --   --   --   HEPARINUNFRC  --   --  0.80*  < > 0.26* 0.41 0.32  CREATININE  --   --  4.74*  --   --   --   --   CKTOTAL 32*  --   --   --   --   --   --   < > = values in this interval not displayed.  Estimated Creatinine Clearance: 30.7 mL/min (A) (by C-G formula based on SCr of 4.74 mg/dL (H)).   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 Starting gtt at 2200 because am dose of apixaban was given at 1000 8/29.  Will need to follow aptt until levels correlate.   Goal of Therapy:  APTT 66-102 Heparin level 0.3-0.7 units/ml Monitor platelets by anticoagulation protocol: Yes   Plan:   HL=67 Therapeutic, however dose was increased to 1700 units/hr and level remain at 67. Therefore, I will increase to 1800 units/hr. Check level in 6 hours  8/31: Heparin drip was stopped for patient to have kidney biopsy. Per Radiology note:  Discussed with Dr. Cherylann RatelLateef and feel that the risks of a renal biopsy at this time are too high and will not proceed with biopsy.  Will restart Heparin drip at 1800 units/hr, F/u aPTT and Heparin level at 1600. Last dose of apixaban on 8/29 @ 1000.  Follow  aPTTs until aPTT and Heparin levels correlate.  8/31 PM: Per RN, heparin drip running at 18 ml/hr. No line issues, no s/sx of bleeding noted. No CBC today. Heparin level 0.26, aPTT 54 - will increase drip to 1900 units/hr (=19 ml/hr). HL/aPTT in 6 h. Will also get CBC with next set of levels.   9/1 0031: Heparin level 0.41 and aPTT 72 are both in range. Will continue with current rate and recheck confirmatory levels in 6 hours.  9/1 0630: Heparin level resulted at 0.32 and aPTT 82. Will continue with current rate. Since aPTT and Heparin level appear to be correlating will transition to Heparin levels only. Next Heparin level with AM labs. Daily CBC monitoring while on Heparin drip.  Pharmacy will continue to follow.   Clovia CuffLisa Adela Esteban, PharmD, BCPS 04/14/2017 7:13 AM

## 2017-04-14 NOTE — Progress Notes (Signed)
Pt off of bipap and on 4l  at this time.

## 2017-04-14 NOTE — Progress Notes (Signed)
[facility] - [place] at [facility]   PATIENT NAME: [name]    MR#:  [id number]  DATE OF BIRTH:  September 13, 1980  SUBJECTIVE:     The patient alert and awake, Complains of generalized weakness. On O2 [place] 4L, BIPAP prn at night.  REVIEW OF SYSTEMS:    Review of Systems  Constitutional: Negative for fever, chills weight loss  Positive generalized weakness  HENT: Negative for ear pain, nosebleeds, congestion, facial swelling, rhinorrhea, neck pain, neck stiffness and ear discharge.   Respiratory: Negative for cough, no shortness of breath, no wheezing  Cardiovascular: Negative for chest pain, palpitations and better leg swelling.  Gastrointestinal: Negative for heartburn, abdominal pain, vomiting, diarrhea or consitpation Genitourinary: Negative for dysuria, urgency, frequency, hematuria Musculoskeletal: Negative for back pain or joint pain Neurological: Negative for dizziness, seizures, syncope, focal weakness,  numbness and headaches.  Hematological: Does not bruise/bleed easily.  Psychiatric/Behavioral: Negative for hallucinations, confusion, dysphoric mood Skin: no rash. DRUG ALLERGIES:  No Known Allergies  VITALS:  Blood pressure 129/77, pulse 65, temperature (!) 97.5 F (36.4 C), temperature source Oral, resp. rate 20, height 5\' 6"  (1.676 m), weight (!) 334 lb (151.5 kg), SpO2 100 %.  PHYSICAL EXAMINATION:  Constitutional: Appears obese anasarca. NAD. Morbid obesity. onBIPAP. HENT: Normocephalic. [name]. Oropharynx is clear and moist.  Eyes: Conjunctivae and EOM are normal. PERRLA, no scleral icterus.  Neck: Normal ROM. Neck supple. No JVD. No tracheal deviation. CVS: RRR, S1/S2 +, no murmurs, no gallops, no carotid bruit.  Pulmonary: Decreased breath sounds but no wheezing or rales. Abdominal: better abdominal wall edema, bowel sounds present. No tenderness or distention. Due to body habitus unable to appreciate for organomegaly. Musculoskeletal:  Much better  bilateral leg edema, no cyanosis, tenderness on left leg.  Neuro: Alert. CN 2-12 grossly intact. No focal deficits. Power 2/5 in lower extremities and 4/5 in upper extremities.  Skin:left thigh covered wound   Right femoral dialysis catheter placed Psychiatric:alert and awake. LABORATORY PANEL:   CBC  Recent Labs Lab 04/14/17 0614  WBC 9.1  HGB 9.1*  HCT 28.1*  PLT 405   ------------------------------------------------------------------------------------------------------------------  Chemistries   Recent Labs Lab 04/11/17 0500 04/12/17 0421  NA 142 141  K 3.7 3.9  CL 102 102  CO2 31 31  GLUCOSE 97 99  BUN 53* 58*  CREATININE 4.28* 4.74*  CALCIUM 7.2* 7.5*  MG 1.8  --    ------------------------------------------------------------------------------------------------------------------  Cardiac Enzymes No results for input(s): TROPONINI in the last 168 hours. ------------------------------------------------------------------------------------------------------------------  RADIOLOGY:  No results found.   ASSESSMENT AND PLAN:   36 year old male with history of chronic diastolic heart failure, primary pulmonary hypertension and hypothyroidism with chronic kidney disease stage III who presents for [facility] due to shortness of breath.   1. Acute on chronic hypoxic respiratory failure with hypoxia in the setting of fluid overload as well as small pneumothorax After thoracentesis Patient titrated off of BiPAP and on nasal cannula 4L. Pneumothorax has resolved  On BiPAP prn. NEB. Try to wean down O2 [place]. Continue NEB. BIPAP prn.  Obesity Hypoventilation Syndrome. BIPAP prn.  * Bilateral moderate to large pleural effusion. S/p US guided thoracentesis. 2 liters of clear, yellowish fluid removed. CXR: Small bilateral pleural effusions, right greater the left, significantly decreased on the left and stable on the right.  2 Acute on chronic kidney  failure Stage III: progression of chronic kidney disease to chronic kidney disease stage V Renal ultrasound showed no hydronephrosis Patient Has started  dialysis which was initiated August 20. He is treated wtih IV albumin during HD. Per Dr. Cherylann Ratel, continue dialysis and consult with interventional radiology regarding for kidney biopsy.  Per radiologist, Image guided renal biopsy will be very difficult with this patient due to his body habitus and may not be technically feasible. In addition, I would be very concerned about managing any bleeding complication after the biopsy. Patient would likely need to be off anticoagulation for at least 24 hours after a renal biopsy. Discussed with Dr. Cherylann Ratel and feel that the risks of a renal biopsy at this time are too high and will not proceed with biopsy.   * Ascites. Large amount of ascites per [place].  S/P US guided paracentesis this am (August 29), yielding 5 liters of peritoneal fluid.. S/p U/s guideded paracentesis status post 2.8 L of fluid on August 20.  3. Acute on chronic diastolic heart failure and pulmonary hypertension and anasarca Continue hemodialysis  Echocardiogram shows normal ejection fraction without major valvular abnormalities.  4. Left thigh wound:  Status post bedside debridement by surgeon. MULTIPLE ORGANISMS PRESENT, NONE PREDOMINANT. NO ANAEROBES ISOLATED per wound cultures.  On vancomycin and cefepime and stopped antibiotics on August 25. Pressure offloading Daily dressing changes with Santyl.  4. Accelerated Essential hypertension: Continue irbesartan and Bisoprolol, resumed Clonidine due to accelerated hypertension, but discontinued since SBP goal 130-150 to allow for adequate fluid removal with HD per Dr. Thedore Mins.  5. Hypothyroid: Continue Synthroid 6. Hyperlipidemia: Continue statin 7. 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 Eliquis is hold (since August 29) and started heparin drip for biosy ( deltoid muscle)  Anemia of chronic disease. Hemoglobin decreased to 6.8. He got 1 unit PRBC transfusion, follow-up hemoglobin up to 9.1. Stable.  Generalized weakness with muscle wasting CK 32, Aldolase 7.3. Hold statin. Per Dr. Thad Ranger, may need EMG as outpatient. Rheumatology consult. Muscle biopsy per Dr. Gavin Potters. Need hold Eliquis for 3-4 days and startedheparin drip. Dr. Excell Seltzer discussed with pathologist, there is no servicefor muscle biopsy in this hospital. The patient needs deltoid muscle biopsy and may transfer to [facility] per Dr. Cherylann Ratel.  Discussed with Dr. Cherylann Ratel.  I discussed [facility] transfer center and discussed with [facility] hospitalist Dr. Ivonne Andrew.he kindly accepted the patient but no bed is available for now.  Physical therapy is recommending the patient return back to skilled nursing facility once stable for discharge. I discussed with RN and Child psychotherapist. Management plans discussed with the patient and he is in agreement.  CODE STATUS: full  TOTAL TIME TAKING CARE OF THIS PATIENT: 33 minutes.   POSSIBLE D/C ? days, DEPENDING ON CLINICAL CONDITION.    Shaune Pollack M.D on 04/14/2017 at 11:52 AM  Between 7am to 6pm - Pager - [phone] After 6pm go to [email or web address] - password [email or web address]  [facility]  Office  [phone]  CC: Primary care physician; Patient, No Pcp Per  Note: This dictation was prepared with Dragon dictation along with smaller phrase technology. Any transcriptional errors that result from this process are unintentional.

## 2017-04-14 NOTE — Progress Notes (Signed)
HD STARTED  

## 2017-04-14 NOTE — Progress Notes (Signed)
[facility]; no bed available but will call when be is available; patient still remains on wait list.

## 2017-04-14 NOTE — Progress Notes (Signed)
POST DIALYSIS ASSESSMENT 

## 2017-04-15 LAB — HEPARIN LEVEL (UNFRACTIONATED)
HEPARIN UNFRACTIONATED: 0.43 [IU]/mL (ref 0.30–0.70)
Heparin Unfractionated: 0.25 IU/mL — ABNORMAL LOW (ref 0.30–0.70)
Heparin Unfractionated: 0.41 IU/mL (ref 0.30–0.70)

## 2017-04-15 LAB — CBC
HEMATOCRIT: 25.9 % — AB (ref 40.0–52.0)
HEMOGLOBIN: 8.4 g/dL — AB (ref 13.0–18.0)
MCH: 26.9 pg (ref 26.0–34.0)
MCHC: 32.4 g/dL (ref 32.0–36.0)
MCV: 83.2 fL (ref 80.0–100.0)
Platelets: 354 10*3/uL (ref 150–440)
RBC: 3.12 MIL/uL — AB (ref 4.40–5.90)
RDW: 15.7 % — ABNORMAL HIGH (ref 11.5–14.5)
WBC: 8.3 10*3/uL (ref 3.8–10.6)

## 2017-04-15 MED ORDER — HEPARIN BOLUS VIA INFUSION
1500.0000 [IU] | Freq: Once | INTRAVENOUS | Status: AC
  Administered 2017-04-15: 1500 [IU] via INTRAVENOUS
  Filled 2017-04-15: qty 1500

## 2017-04-15 NOTE — Progress Notes (Signed)
[facility] called this shift; no bed available; will inform hospital when bed is available; patient and MD made aware; Will continue to monitor

## 2017-04-15 NOTE — Progress Notes (Signed)
ANTICOAGULATION CONSULT NOTE   Pharmacy Consult for heparin drip Indication: vte treatment - on apixaban PTA No Known Allergies  Patient Measurements: Height: 5\' 6"  (167.6 cm) Weight:  (uta- bed scale broken) IBW/kg (Calculated) : 63.8 Heparin Dosing Weight: 101  Vital Signs: Temp: 98.3 F (36.8 C) (09/02 1310) Temp Source: Oral (09/02 1310) BP: 153/85 (09/02 1310) Pulse Rate: 64 (09/02 1310)  Labs:  Recent Labs  04/13/17 1559  04/14/17 0031 04/14/17 0614 04/15/17 0451 04/15/17 1326  HGB  --   < > 10.3* 9.1* 8.4*  --   HCT  --   --  32.5* 28.1* 25.9*  --   PLT  --   --  418 405 354  --   APTT 54*  --  72* 82*  --   --   HEPARINUNFRC 0.26*  --  0.41 0.32 0.25* 0.43  < > = values in this interval not displayed.  Estimated Creatinine Clearance: 30.7 mL/min (A) (by C-G formula based on SCr of 4.74 mg/dL (H)).   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 Starting gtt at 2200 because am dose of apixaban was given at 1000 8/29.  Will need to follow aptt until levels correlate.   Goal of Therapy:  APTT 66-102 Heparin level 0.3-0.7 units/ml Monitor platelets by anticoagulation protocol: Yes   Plan:   HL=67 Therapeutic, however dose was increased to 1700 units/hr and level remain at 67. Therefore, I will increase to 1800 units/hr. Check level in 6 hours  8/31: Heparin drip was stopped for patient to have kidney biopsy. Per Radiology note:  Discussed with Dr. Cherylann RatelLateef and feel that the risks of a renal biopsy at this time are too high and will not proceed with biopsy.  Will restart Heparin drip at 1800 units/hr, F/u aPTT and Heparin level at 1600. Last dose of apixaban on 8/29 @ 1000.  Follow aPTTs until aPTT and Heparin levels correlate.  8/31 PM: Per RN, heparin drip running at 18 ml/hr. No line issues, no s/sx of bleeding noted. No CBC today. Heparin level 0.26, aPTT 54 - will increase drip  to 1900 units/hr (=19 ml/hr). HL/aPTT in 6 h. Will also get CBC with next set of levels.   9/1 0031: Heparin level 0.41 and aPTT 72 are both in range. Will continue with current rate and recheck confirmatory levels in 6 hours.  9/1 0630: Heparin level resulted at 0.32 and aPTT 82. Will continue with current rate. Since aPTT and Heparin level appear to be correlating will transition to Heparin levels only. Next Heparin level with AM labs. Daily CBC monitoring while on Heparin drip.  9/2 AM heparin level 0.25. 1500 unit bolus and increase rate to 2100 units/hr. Recheck in 6 hours.  9/2 1330 HL 0.43. Therapeutic. Will continue heparin drip at current rate.  Recheck to confirm in 6h. CBC in AM.   Pharmacy will continue to follow.    Crist FatHannah Scherrie Seneca, PharmD, BCPS Clinical Pharmacist 04/15/2017 2:36 PM

## 2017-04-15 NOTE — Progress Notes (Addendum)
ANTICOAGULATION CONSULT NOTE   Pharmacy Consult for heparin drip Indication: vte treatment - on apixaban PTA No Known Allergies  Patient Measurements: Height: 5\' 6"  (167.6 cm) Weight:  (uta- bed scale broken) IBW/kg (Calculated) : 63.8 Heparin Dosing Weight: 101  Vital Signs: Temp: 97.9 F (36.6 C) (09/02 2030) Temp Source: Oral (09/02 2030) BP: 158/91 (09/02 2030) Pulse Rate: 76 (09/02 2030)  Labs:  Recent Labs  04/13/17 1559  04/14/17 0031 04/14/17 0614 04/15/17 0451 04/15/17 1326 04/15/17 2010  HGB  --   < > 10.3* 9.1* 8.4*  --   --   HCT  --   --  32.5* 28.1* 25.9*  --   --   PLT  --   --  418 405 354  --   --   APTT 54*  --  72* 82*  --   --   --   HEPARINUNFRC 0.26*  --  0.41 0.32 0.25* 0.43 0.41  < > = values in this interval not displayed.  Estimated Creatinine Clearance: 30.7 mL/min (A) (by C-G formula based on SCr of 4.74 mg/dL (H)).   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 Starting gtt at 2200 because am dose of apixaban was given at 1000 8/29.  Will need to follow aptt until levels correlate.   Goal of Therapy:  APTT 66-102 Heparin level 0.3-0.7 units/ml Monitor platelets by anticoagulation protocol: Yes   Plan:   HL=67 Therapeutic, however dose was increased to 1700 units/hr and level remain at 67. Therefore, I will increase to 1800 units/hr. Check level in 6 hours  8/31: Heparin drip was stopped for patient to have kidney biopsy. Per Radiology note:  Discussed with Dr. Cherylann RatelLateef and feel that the risks of a renal biopsy at this time are too high and will not proceed with biopsy.  Will restart Heparin drip at 1800 units/hr, F/u aPTT and Heparin level at 1600. Last dose of apixaban on 8/29 @ 1000.  Follow aPTTs until aPTT and Heparin levels correlate.  8/31 PM: Per RN, heparin drip running at 18 ml/hr. No line issues, no s/sx of bleeding noted. No CBC today. Heparin  level 0.26, aPTT 54 - will increase drip to 1900 units/hr (=19 ml/hr). HL/aPTT in 6 h. Will also get CBC with next set of levels.   9/1 0031: Heparin level 0.41 and aPTT 72 are both in range. Will continue with current rate and recheck confirmatory levels in 6 hours.  9/1 0630: Heparin level resulted at 0.32 and aPTT 82. Will continue with current rate. Since aPTT and Heparin level appear to be correlating will transition to Heparin levels only. Next Heparin level with AM labs. Daily CBC monitoring while on Heparin drip.  9/2 AM heparin level 0.25. 1500 unit bolus and increase rate to 2100 units/hr. Recheck in 6 hours.  9/2 1330 HL 0.43. Therapeutic. Will continue heparin drip at current rate.  Recheck to confirm in 6h. CBC in AM.   9/2 HL @ 20:00 = 0.41.  Will continue current rate and recheck HL on 9/3 @ 0500.   9/3 AM heparin level 0.37. Continue current regimen. Recheck heparin level and CBC with tomorrow AM labs.  Pharmacy will continue to follow.    Robbins,Jason D Clinical Pharmacist 04/15/2017 9:04 PM

## 2017-04-15 NOTE — Clinical Social Work Note (Signed)
Patient is a long term care resident at [facility], plan is for him to return once he is medically ready for discharge and orders have been received.  CSW continuing to follow patient's progress throughout discharge planning.  Ervin KnackEric R. Lameka Disla, MSW, Theresia MajorsLCSWA [phone]  04/15/2017 4:32 PM

## 2017-04-15 NOTE — Progress Notes (Signed)
ANTICOAGULATION CONSULT NOTE   Pharmacy Consult for heparin drip Indication: vte treatment - on apixaban PTA No Known Allergies  Patient Measurements: Height: 5\' 6"  (167.6 cm) Weight:  (uta- bed scale broken) IBW/kg (Calculated) : 63.8 Heparin Dosing Weight: 101  Vital Signs: Temp: 97.6 F (36.4 C) (09/02 0507) Temp Source: Oral (09/02 0507) BP: 147/77 (09/02 0507) Pulse Rate: 68 (09/02 0507)  Labs:  Recent Labs  04/13/17 1559  04/14/17 0031 04/14/17 [id number] 04/15/17 0451  HGB  --   < > 10.3* 9.1* 8.4*  HCT  --   --  32.5* 28.1* 25.9*  PLT  --   --  418 405 354  APTT 54*  --  72* 82*  --   HEPARINUNFRC 0.26*  --  0.41 0.32 0.25*  < > = values in this interval not displayed.  Estimated Creatinine Clearance: 30.7 mL/min (A) (by C-G formula based on SCr of 4.74 mg/dL (H)).   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 Starting gtt at 2200 because am dose of apixaban was given at 1000 8/29.  Will need to follow aptt until levels correlate.   Goal of Therapy:  APTT 66-102 Heparin level 0.3-0.7 units/ml Monitor platelets by anticoagulation protocol: Yes   Plan:   HL=67 Therapeutic, however dose was increased to 1700 units/hr and level remain at 67. Therefore, I will increase to 1800 units/hr. Check level in 6 hours  8/31: Heparin drip was stopped for patient to have kidney biopsy. Per Radiology note:  Discussed with Dr. Cherylann RatelLateef and feel that the risks of a renal biopsy at this time are too high and will not proceed with biopsy.  Will restart Heparin drip at 1800 units/hr, F/u aPTT and Heparin level at 1600. Last dose of apixaban on 8/29 @ 1000.  Follow aPTTs until aPTT and Heparin levels correlate.  8/31 PM: Per RN, heparin drip running at 18 ml/hr. No line issues, no s/sx of bleeding noted. No CBC today. Heparin level 0.26, aPTT 54 - will increase drip to 1900 units/hr (=19 ml/hr). HL/aPTT  in 6 h. Will also get CBC with next set of levels.   9/1 0031: Heparin level 0.41 and aPTT 72 are both in range. Will continue with current rate and recheck confirmatory levels in 6 hours.  9/1 0630: Heparin level resulted at 0.32 and aPTT 82. Will continue with current rate. Since aPTT and Heparin level appear to be correlating will transition to Heparin levels only. Next Heparin level with AM labs. Daily CBC monitoring while on Heparin drip.  9/2 AM heparin level 0.25. 1500 unit bolus and increase rate to 2100 units/hr. Recheck in 6 hours.  Pharmacy will continue to follow.   Clovia CuffLisa Kluttz, PharmD, BCPS 04/15/2017 7:06 AM

## 2017-04-15 NOTE — Progress Notes (Signed)
[facility]   PATIENT NAME: [name]    MR#:  [id number]  DATE OF BIRTH:  04-15-1981  SUBJECTIVE:     The patient alert and awake, Complains of generalized weakness. On O2 [place] 3L, hypoxia last night, BIPAP prn at night.  REVIEW OF SYSTEMS:    Review of Systems  Constitutional: Negative for fever, chills weight loss  Positive generalized weakness  HENT: Negative for ear pain, nosebleeds, congestion, facial swelling, rhinorrhea, neck pain, neck stiffness and ear discharge.   Respiratory: Negative for cough, no shortness of breath, no wheezing  Cardiovascular: Negative for chest pain, palpitations and better leg swelling.  Gastrointestinal: Negative for heartburn, abdominal pain, vomiting, diarrhea or consitpation Genitourinary: Negative for dysuria, urgency, frequency, hematuria Musculoskeletal: Negative for back pain or joint pain Neurological: Negative for dizziness, seizures, syncope, focal weakness,  numbness and headaches.  Hematological: Does not bruise/bleed easily.  Psychiatric/Behavioral: Negative for hallucinations, confusion, dysphoric mood Skin: no rash. DRUG ALLERGIES:  No Known Allergies  VITALS:  Blood pressure (!) 153/85, pulse 64, temperature 98.3 F (36.8 C), temperature source Oral, resp. rate 18, height 5\' 6"  (1.676 m), weight (!) 334 lb (151.5 kg), SpO2 99 %.  PHYSICAL EXAMINATION:  Constitutional: Appears obese anasarca. NAD. Morbid obesity. HENT: Normocephalic. [name] Oropharynx is clear and moist.  Eyes: Conjunctivae and EOM are normal. PERRLA, no scleral icterus.  Neck: Normal ROM. Neck supple. No JVD. No tracheal deviation. CVS: RRR, S1/S2 +, no murmurs, no gallops, no carotid bruit.  Pulmonary: Decreased breath sounds but no wheezing or rales. Abdominal: better abdominal wall edema, bowel sounds present. No tenderness or distention. Due to body habitus unable to appreciate for organomegaly. Musculoskeletal:   Much better bilateral leg edema, no cyanosis, tenderness on left leg.  Neuro: Alert. CN 2-12 grossly intact. No focal deficits. Power 2/5 in lower extremities and 4/5 in upper extremities.  Skin:left thigh covered wound   Right femoral dialysis catheter placed Psychiatric:alert and awake. LABORATORY PANEL:   CBC  Recent Labs Lab 04/15/17 0451  WBC 8.3  HGB 8.4*  HCT 25.9*  PLT 354   ------------------------------------------------------------------------------------------------------------------  Chemistries   Recent Labs Lab 04/11/17 0500 04/12/17 0421  NA 142 141  K 3.7 3.9  CL 102 102  CO2 31 31  GLUCOSE 97 99  BUN 53* 58*  CREATININE 4.28* 4.74*  CALCIUM 7.2* 7.5*  MG 1.8  --    ------------------------------------------------------------------------------------------------------------------  Cardiac Enzymes No results for input(s): TROPONINI in the last 168 hours. ------------------------------------------------------------------------------------------------------------------  RADIOLOGY:  No results found.   ASSESSMENT AND PLAN:   36 year old male with history of chronic diastolic heart failure, primary pulmonary hypertension and hypothyroidism with chronic kidney disease stage III who presents for [facility] due to shortness of breath.   1. Acute on chronic hypoxic respiratory failure with hypoxia in the setting of fluid overload as well as small pneumothorax After thoracentesis Patient titrated off of BiPAP and on nasal cannula 4L. Pneumothorax has resolved  On BiPAP prn. NEB. continueO2 [place]. Continue NEB. BIPAP prn.  Obesity Hypoventilation Syndrome. BIPAP prn.  * Bilateral moderate to large pleural effusion. S/p US guided thoracentesis. 2 liters of clear, yellowish fluid removed. CXR: Small bilateral pleural effusions, right greater the left, significantly decreased on the left and stable on the right.  2 Acute on chronic kidney  failure Stage III: progression of chronic kidney disease to chronic kidney disease stage V Renal ultrasound showed no hydronephrosis Patient Has started dialysis which  was initiated August 20. He is treated wtih IV albumin during HD. Per Dr. Cherylann RatelLateef, continue dialysis and consult with interventional radiology regarding for kidney biopsy.  Per radiologist, Image guided renal biopsy will be very difficult with this patient due to his body habitus and may not be technically feasible. In addition, I would be very concerned about managing any bleeding complication after the biopsy. Patient would likely need to be off anticoagulation for at least 24 hours after a renal biopsy. Discussed with Dr. Cherylann RatelLateef and feel that the risks of a renal biopsy at this time are too high and will not proceed with biopsy.   * Ascites. Large amount of ascites per US.  S/P US guided paracentesis this am (August 29), yielding 5 liters of peritoneal fluid.. S/p U/s guideded paracentesis status post 2.8 L of fluid on August 20.  3. Acute on chronic diastolic heart failure and pulmonary hypertension and anasarca Continue hemodialysis  Echocardiogram shows normal ejection fraction without major valvular abnormalities.  4. Left thigh wound:  Status post bedside debridement by surgeon. MULTIPLE ORGANISMS PRESENT, NONE PREDOMINANT. NO ANAEROBES ISOLATED per wound cultures.  On vancomycin and cefepime and stopped antibiotics on August 25. Pressure offloading Daily dressing changes with Santyl.  4. Accelerated Essential hypertension: Continue irbesartan and Bisoprolol, resumed Clonidine due to accelerated hypertension, but discontinued since SBP goal 130-150 to allow for adequate fluid removal with HD per Dr. Thedore MinsSingh.  5. Hypothyroid: Continue Synthroid 6. Hyperlipidemia: Continue statin 7. 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 Eliquis is hold (since August 29) and started heparin drip for biosy ( deltoid muscle)  Anemia of chronic disease. Hemoglobin decreased to 6.8. He got 1 unit PRBC transfusion, follow-up hemoglobin up to 9.1. Hb 8.4 today. F/u Hb.  Generalized weakness with muscle wasting CK 32, Aldolase 7.3. Hold statin. Per Dr. Thad Rangereynolds, may need EMG as outpatient. Rheumatology consult. Muscle biopsy per Dr. Gavin PottersKernodle. Need hold Eliquis for 3-4 days and startedheparin drip. Dr. Excell Seltzerooper discussed with pathologist, there is no servicefor muscle biopsy in this hospital. The patient needs deltoid muscle biopsy and [facility] Dr. Cherylann RatelLateef.  I discussed [facility] transfer [facility] and [facility] Ivonne Andrewonohoe.he kindly accepted the patient. Still no bed is available.  Physical therapy is recommending the patient return back to skilled nursing facility once stable for discharge. I discussed with RN and Child psychotherapistsocial worker. Management plans discussed with the patient and he is in agreement.  CODE STATUS: full  TOTAL TIME TAKING CARE OF THIS PATIENT: 27 minutes.   POSSIBLE D/C ? days, DEPENDING ON CLINICAL CONDITION.    Shaune Pollackhen, Chigozie Basaldua M.D on 04/15/2017 at 1:49 PM  Between 7am to 6pm - Pager - [phone] After 6pm go to [email or web address] - password [email or web address]  [facility]  Office  [phone]  CC: Primary care physician; Patient, No Pcp Per  Note: This dictation was prepared with Dragon dictation along with smaller phrase technology. Any transcriptional errors that result from this process are unintentional.

## 2017-04-15 NOTE — Progress Notes (Signed)
[facility]  ROUNDING NOTE   Subjective:  Patient did have dialysis yesterday. Tolerated this well. Urine output was 1.2 L over the preceding 24 hours.  Objective:  Vital signs in last 24 hours:  Temp:  [97.6 F (36.4 C)-98.3 F (36.8 C)] 97.8 F (36.6 C) (09/02 1025) Pulse Rate:  [64-82] 82 (09/02 1025) Resp:  [18-20] 20 (09/02 1025) BP: (142-158)/(75-106) 153/106 (09/02 1025) SpO2:  [96 %-100 %] 97 % (09/02 0507)  Weight change:  Filed Weights    Intake/Output: I/O last 3 completed shifts: In: 870.8 [I.V.:633.8; NG/GT:237] Out: 3700 [Urine:1700; Other:2000]   Intake/Output this shift:  Total I/O In: 120 [P.O.:120] Out: 175 [Urine:175]  Physical Exam: General: Chronically ill-appearing, laying in bed  Head: +temporal wasting  Eyes: Anicteric  Neck: supple  Lungs:  Diminished bilaterally, Normal effort   Heart: S1S2 no rubs  Abdomen:  Soft, nontender, obese, pannus noted  Extremities: 2+ b/l LE edema  Neurologic: significant generalized muscle wasting  Skin: Prurigo lesions over legs and thighs, dressing on thighs  Access: Rt IJ temp cath    Basic Metabolic Panel:  Recent Labs Lab 04/10/17 1009 04/11/17 0500 04/12/17 0421 04/14/17 1246  NA  --  142 141  --   K  --  3.7 3.9  --   CL  --  102 102  --   CO2  --  31 31  --   GLUCOSE  --  97 99  --   BUN  --  53* 58*  --   CREATININE  --  4.28* 4.74*  --   CALCIUM  --  7.2* 7.5*  --   MG  --  1.8  --   --   PHOS 6.5* 5.3*  --  5.1*    Liver Function Tests: No results for input(s): AST, ALT, ALKPHOS, BILITOT, PROT, ALBUMIN in the last 168 hours. No results for input(s): LIPASE, AMYLASE in the last 168 hours. No results for input(s): AMMONIA in the last 168 hours.  CBC:  Recent Labs Lab 04/11/17 0500 04/12/17 0421 04/14/17 0031 04/14/17 0614 04/15/17 0451  WBC 11.3* 12.3* 11.0* 9.1 8.3  HGB 6.8* 8.9* 10.3* 9.1* 8.4*  HCT 21.8* 27.9* 32.5* 28.1* 25.9*  MCV 83.1 82.9 85.2 82.9  83.2  PLT 358 393 418 405 354    Cardiac Enzymes:  Recent Labs Lab 04/11/17 1634  CKTOTAL 32*    BNP: Invalid input(s): POCBNP  CBG: No results for input(s): GLUCAP in the last 168 hours.  Microbiology: Results for orders placed or performed during the hospital encounter of 03/30/17  Aerobic/Anaerobic Culture (surgical/deep wound)     Status: Abnormal   Collection Time: 03/30/17  7:56 PM  Result Value Ref Range Status   Specimen Description WOUND LEFT THIGH  Final   Special Requests NONE  Final   Gram Stain   Final    ABUNDANT WBC PRESENT, PREDOMINANTLY PMN ABUNDANT GRAM POSITIVE COCCI ABUNDANT GRAM NEGATIVE RODS MODERATE GRAM POSITIVE RODS MODERATE GRAM NEGATIVE COCCI    Culture (A)  Final    MULTIPLE ORGANISMS PRESENT, NONE PREDOMINANT NO ANAEROBES ISOLATED Performed at [facility], [address], [place], [place] [address]    Report Status 04/05/2017 FINAL  Final  MRSA PCR Screening     Status: Abnormal   Collection Time: 03/30/17  8:58 PM  Result Value Ref Range Status   MRSA by PCR POSITIVE (A) NEGATIVE Final    Comment:  The GeneXpert MRSA Assay (FDA approved for NASAL specimens only), is one component of a comprehensive MRSA colonization surveillance program. It is not intended to diagnose MRSA infection nor to guide or monitor treatment for MRSA infections. RESULT CALLED TO, READ BACK BY AND VERIFIED WITH: KARA CAMPBELL AT 2230 03/30/17.PMH   Body fluid culture     Status: None   Collection Time: 04/02/17 11:50 AM  Result Value Ref Range Status   Specimen Description PLEURAL  Final   Special Requests NONE  Final   Gram Stain NO WBC SEEN NO ORGANISMS SEEN   Final   Culture   Final    NO GROWTH 3 DAYS Performed at [facility], [address], [place], [place] [address]    Report Status 04/05/2017 FINAL  Final  Acid Fast Smear (AFB)     Status: None   Collection Time: 04/02/17 11:50 AM  Result Value Ref Range Status   AFB  Specimen Processing Concentration  Final   Acid Fast Smear Negative  Final    Comment: (NOTE) Performed At: [facility] [address] [place], [place] [id number] Mila Homer MD [id number]    Source (AFB) PLEURAL  Final    Coagulation Studies: No results for input(s): LABPROT, INR in the last 72 hours.  Urinalysis: No results for input(s): COLORURINE, LABSPEC, PHURINE, GLUCOSEU, HGBUR, BILIRUBINUR, KETONESUR, PROTEINUR, UROBILINOGEN, NITRITE, LEUKOCYTESUR in the last 72 hours.  Invalid input(s): APPERANCEUR    Imaging: No results found.   Medications:   . albumin human 25 g (04/14/17 1245)  . heparin 2,100 Units/hr (04/15/17 0723)   . amLODipine  5 mg Oral Daily  . bisoprolol  10 mg Oral Daily  . buPROPion  100 mg Oral BID  . collagenase   Topical Daily  . docusate sodium  100 mg Oral BID  . feeding supplement (NEPRO CARB STEADY)  237 mL Oral TID BM  . ferrous sulfate  325 mg Oral Q1200  . irbesartan  300 mg Oral QHS  . levothyroxine  75 mcg Oral QAC breakfast  . multivitamin  1 tablet Oral QHS   acetaminophen **OR** [DISCONTINUED] acetaminophen, albumin human, hydrALAZINE, ipratropium-albuterol, lip balm, LORazepam, morphine injection, [DISCONTINUED] ondansetron **OR** ondansetron (ZOFRAN) IV, traMADol  Assessment/ Plan:  [name] is a 36 y.o. white male with hypertension, morbid obesity, pulmonary hypertension, hypothyroidism, hyperlipidemia, depression/anxiety, coronary artery disease, congestive heart failure, who was admitted to [facility] on 03/30/2017  1. Acute renal failure on chronic kidney disease stage III with nephrotic range proteinuria, hematuria and glucosuria No baseline creatinine available. Creatinine of 1.3 in 12/2014 (care everywhere).  Concern for progression of chronic kidney disease to CKD stage V.  Patient has Nephrotic syndrome of unknown etiology. He states he has been dealing with edema for over 2-3 yeas but did not seek  medical care. Renal ultrasound report reviewed. HIV negative Not a candidate for kidney biopsy due to body habitus/ in ability to lie prone/ current infection, patient was also evaluated by interventional radiology for renal biopsy, they also felt that risks outweigh the benefits in terms of renal biopsy and that renal biopsy would be technically very difficult.  - 1st HD on 8/20; PPD placed on 8/20 -  Patient underwent hemodialysis yesterday. No acute indication for dialysis today. We will plan for dialysis again on Tuesday.  2. Anasarca, with HTN. Echocardiogram with normal systolic function 8/18. No visualization of hepatic cirrhosis on CT abd/pelvis -  2 L of ultrafiltration performed yesterday.  Patient is still making urine at this time.  3. Hyperphosphatemia.   PTH 60  - Phosphorus down to 5.1 yesterday and acceptable.  4. Anemia of chronic kidney disease: Hemoglobin drifting down again. Currently hemoglobin is 8.4. Consider adding Epogen with dialysis next week.  5. Generalized weakness with muscle wasting Neurology evaluation in progress - hold statin - Appreciate rheumatology input. Patient needs deltoid muscle biopsy, however it appears that this cannot be done here, [facility].  6. B/l pleural effusion -  Thoracentesis performed 04/09/2017.    LOS: 16 Myles Tavella 9/2/201811:34 AM

## 2017-04-16 ENCOUNTER — Ambulatory Visit (HOSPITAL_COMMUNITY)
Admission: AD | Admit: 2017-04-16 | Discharge: 2017-04-16 | Disposition: A | Discharge status: Home / Self Care | Payer: Medicaid Other | Source: Other Acute Inpatient Hospital | Attending: Internal Medicine | Admitting: Internal Medicine

## 2017-04-16 ENCOUNTER — Inpatient Hospital Stay (HOSPITAL_COMMUNITY): Admit: 2017-04-16 | Payer: Self-pay

## 2017-04-16 DIAGNOSIS — J9690 Respiratory failure, unspecified, unspecified whether with hypoxia or hypercapnia: Secondary | ICD-10-CM | POA: Insufficient documentation

## 2017-04-16 LAB — HEPARIN LEVEL (UNFRACTIONATED): Heparin Unfractionated: 0.37 IU/mL (ref 0.30–0.70)

## 2017-04-16 LAB — CBC
HEMATOCRIT: 27.5 % — AB (ref 40.0–52.0)
HEMOGLOBIN: 8.7 g/dL — AB (ref 13.0–18.0)
MCH: 26.8 pg (ref 26.0–34.0)
MCHC: 31.7 g/dL — AB (ref 32.0–36.0)
MCV: 84.7 fL (ref 80.0–100.0)
Platelets: 373 10*3/uL (ref 150–440)
RBC: 3.25 MIL/uL — AB (ref 4.40–5.90)
RDW: 15.8 % — ABNORMAL HIGH (ref 11.5–14.5)
WBC: 8 10*3/uL (ref 3.8–10.6)

## 2017-04-16 NOTE — Progress Notes (Addendum)
Patient transferred via [facility] to [facility].Handoff to [facility] completed. Nurse Ree KidaJack Doughtry had called report to nurse Alan Ripperlaire at [facility]. [address] was just notified that patient being transported by Stephania FragminKelly Rhian Funari, RN.  VSS. Patient's belongings transported with him.

## 2017-04-16 NOTE — Progress Notes (Signed)
[employer] -  at [facility]   PATIENT NAME: [name]    MR#:  [id number]  DATE OF BIRTH:  [birth year]  SUBJECTIVE:     The patient alert and awake, Complains of left thigh wound pain while changing dressing. On O2 [place] 3L, BIPAP at night.  REVIEW OF SYSTEMS:    Review of Systems  Constitutional: Negative for fever, chills weight loss  Positive generalized weakness  HENT: Negative for ear pain, nosebleeds, congestion, facial swelling, rhinorrhea, neck pain, neck stiffness and ear discharge.   Respiratory: Negative for cough, no shortness of breath, no wheezing  Cardiovascular: Negative for chest pain, palpitations and better leg swelling.  Gastrointestinal: Negative for heartburn, abdominal pain, vomiting, diarrhea or consitpation Genitourinary: Negative for dysuria, urgency, frequency, hematuria Musculoskeletal: Negative for back pain or joint pain Neurological: Negative for dizziness, seizures, syncope, focal weakness,  numbness and headaches.  Hematological: Does not bruise/bleed easily.  Psychiatric/Behavioral: Negative for hallucinations, confusion, dysphoric mood Skin: no rash. DRUG ALLERGIES:  No Known Allergies  VITALS:  Blood pressure 134/86, pulse 62, temperature 98.2 F (36.8 C), temperature source Oral, resp. rate 20, height 5\' 6"  (1.676 m), weight (!) 334 lb (151.5 kg), SpO2 100 %.  PHYSICAL EXAMINATION:  Constitutional: Appears obese anasarca. NAD. Morbid obesity. HENT: Normocephalic. [name] Oropharynx is clear and moist.  Eyes: Conjunctivae and EOM are normal. PERRLA, no scleral icterus.  Neck: Normal ROM. Neck supple. No JVD. No tracheal deviation. CVS: RRR, S1/S2 +, no murmurs, no gallops, no carotid bruit.  Pulmonary: Decreased breath sounds but no wheezing or rales. Abdominal: better abdominal wall edema, bowel sounds present. No tenderness or distention. Due to body habitus unable to appreciate for organomegaly. Musculoskeletal:  Much  better bilateral leg edema, no cyanosis, tenderness on left leg.  Neuro: Alert. CN 2-12 grossly intact. No focal deficits. Power 2/5 in lower extremities and 4/5 in upper extremities.  Skin:left thigh covered wound   Right femoral dialysis catheter placed Psychiatric:alert and awake. LABORATORY PANEL:   CBC  Recent Labs Lab 04/16/17 0333  WBC 8.0  HGB 8.7*  HCT 27.5*  PLT 373   ------------------------------------------------------------------------------------------------------------------  Chemistries   Recent Labs Lab 04/11/17 0500 04/12/17 0421  NA 142 141  K 3.7 3.9  CL 102 102  CO2 31 31  GLUCOSE 97 99  BUN 53* 58*  CREATININE 4.28* 4.74*  CALCIUM 7.2* 7.5*  MG 1.8  --    ------------------------------------------------------------------------------------------------------------------  Cardiac Enzymes No results for input(s): TROPONINI in the last 168 hours. ------------------------------------------------------------------------------------------------------------------  RADIOLOGY:  No results found.   ASSESSMENT AND PLAN:   36 year old male with history of chronic diastolic heart failure, primary pulmonary hypertension and hypothyroidism with chronic kidney disease stage III who presents for [facility] due to shortness of breath.   1. Acute on chronic hypoxic respiratory failure with hypoxia in the setting of fluid overload as well as small pneumothorax After thoracentesis Patient titrated off of BiPAP and on nasal cannula 4L. Pneumothorax has resolved  On BiPAP prn. NEB. continueO2 [place]. Continue NEB. BIPAP prn.  Obesity Hypoventilation Syndrome. BIPAP prn.  * Bilateral moderate to large pleural effusion. S/p US guided thoracentesis. 2 liters of clear, yellowish fluid removed. CXR: Small bilateral pleural effusions, right greater the left, significantly decreased on the left and stable on the right.  2 Acute on chronic kidney failure  Stage III: progression of chronic kidney disease to chronic kidney disease stage V Renal ultrasound showed no hydronephrosis Patient Has started dialysis which  was initiated August 20. He is treated wtih IV albumin during HD. Per Dr. Cherylann RatelLateef, continue dialysis and consult with interventional radiology regarding for kidney biopsy.  Per radiologist, Image guided renal biopsy will be very difficult with this patient due to his body habitus and may not be technically feasible. In addition, I would be very concerned about managing any bleeding complication after the biopsy. Patient would likely need to be off anticoagulation for at least 24 hours after a renal biopsy. Discussed with Dr. Cherylann RatelLateef and feel that the risks of a renal biopsy at this time are too high and will not proceed with biopsy.   * Ascites. Large amount of ascites per US.  S/P US guided paracentesis this am (August 29), yielding 5 liters of peritoneal fluid. S/p U/s guideded paracentesis status post 2.8 L of fluid on August 20.  3. Acute on chronic diastolic heart failure and pulmonary hypertension and anasarca Continue hemodialysis  Echocardiogram shows normal ejection fraction without major valvular abnormalities.  4. Left thigh wound:  Status post bedside debridement by surgeon. MULTIPLE ORGANISMS PRESENT, NONE PREDOMINANT. NO ANAEROBES ISOLATED per wound cultures.  On vancomycin and cefepime and stopped antibiotics on August 25. Pressure offloading Daily dressing changes with Santyl.  4. Accelerated Essential hypertension: Continue irbesartan and Bisoprolol, resumed Clonidine due to accelerated hypertension, but discontinued since SBP goal 130-150 to allow for adequate fluid removal with HD per Dr. Thedore MinsSingh.  5. Hypothyroid: Continue Synthroid 6. Hyperlipidemia: Continue statin 7. 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 Eliquis is hold (since August 29) and started heparin drip for biosy ( deltoid muscle)  Anemia of chronic disease. Hemoglobin decreased to 6.8. He got 1 unit PRBC transfusion, follow-up hemoglobin up to 9.1. Hb 8.7 today. F/u Hb.  Generalized weakness with muscle wasting CK 32, Aldolase 7.3. Hold statin. Per Dr. Thad Rangereynolds, may need EMG as outpatient. Rheumatology consult. Muscle biopsy per Dr. Gavin PottersKernodle. Need hold Eliquis for 3-4 days and startedheparin drip. Dr. Excell Seltzerooper discussed with pathologist, there is no servicefor muscle biopsy in this hospital. The patient needs deltoid muscle biopsy and [facility] Dr. Cherylann RatelLateef.  I discussed [facility] transfer center and [facility] hospital[facility] Dr. Ivonne Andrewonohoe.he kindly accepted the patient. Still no bed is available.  Discussed with Dr. Cherylann RatelLateef and Dr. Gavin PottersKernodle. Physical therapy is recommending the patient return back to skilled nursing facility once stable for discharge. I discussed with RN and Child psychotherapistsocial worker. Management plans discussed with the patient and he is in agreement.  CODE STATUS: full  TOTAL TIME TAKING CARE OF THIS PATIENT: 26 minutes.   POSSIBLE D/C ? days, DEPENDING ON CLINICAL CONDITION.    [name], [name] M.D on 04/16/2017 at 1:37 PM  Between 7am to 6pm - Pager - [phone] After 6pm go to [email or web address] - password [email or web address]  [facility] [place] [facility]  Office  [phone]  CC: Primary care physician; Patient, No Pcp Per  Note: This dictation was prepared with Dragon dictation along with smaller phrase technology. Any transcriptional errors that result from this process are unintentional.

## 2017-04-16 NOTE — Evaluation (Addendum)
Clinical/Bedside Swallow Evaluation Patient Details  Name: [name] MRN: [id number] Date of Birth: 1980/10/05  Today's Date: 04/16/2017 Time: SLP Start Time (ACUTE ONLY): 1245 SLP Stop Time (ACUTE ONLY): 1345 SLP Time Calculation (min) (ACUTE ONLY): 60 min  Past Medical History:  Past Medical History:  Diagnosis Date  . CKD (chronic kidney disease)   . Depression   . Diastolic CHF (HCC)   . Generalized anxiety disorder   . Hyperlipidemia   . Hypertension   . Hypertension   . Hypothyroidism   . Lymphedema   . Obesity   . Pulmonary hypertension (HCC)    Past Surgical History:  Past Surgical History:  Procedure Laterality Date  . CARDIAC CATHETERIZATION    . DIALYSIS/PERMA CATHETER INSERTION N/A 04/02/2017   Procedure: DIALYSIS/PERMA CATHETER INSERTION;  Surgeon: Annice Needy, MD;  Location: [facility] INVASIVE CV LAB;  Service: Cardiovascular;  Laterality: N/A;   HPI:  Pt is a 36 y.o. male with a known history of Major depression, diastolic congestive heart failure, systemic hypertension, primary pulmonary hypertension, hypothyroidism, bilateral lymphedema, obesity, CK D stage III, anxiety disorder presents from [facility] secondary to dyspnea and acute renal failure. Patient has been living at [facility] for almost 4 months now. He is mostly wheelchair bound and also uses a walker. His ambulation has been significantly decreased in the last 2 months. He has had bilateral lymphedema for several years now. Had a right heart catheterization at [facility] in 2016 showing primary pulmonary hypertension and diastolic CHF. He has been on Lasix and metolazone. He says his urine output has been normal. He has trouble with hesitancy. Pt has been receiving hemodialysis 3x week. MD is concerned about overall muscle weakness and ordered a swallow evaluation to r/o any deficits. He has had a thorocentesis and paracentesis to remove fluid since admission. Of note, pt does describe GI Esophageal  Dysmotility ~15 years ago when he was a teenager and received a GI assessment then.    Assessment / Plan / Recommendation Clinical Impression  Pt appears to present w/ adequate oropharyngeal phase swallow function w/ no overt s/s of aspiration noted w/ the po trials consumed at evaluation. Pt exhibited gross oral phase deficits; timely mastication, transfer and oral clearing noted. No overt coughing or decline in vocal quality noted during/post swallowing of the pharyngeal phase; no decline in respiratory status apparent post trials. Pt fed self. Of note, he described fairly consistent feelings of Esophageal Dysmotility and GI discomfort. He endorsed feelings of food stopping mid-sternum area, globus feelings. He endorsed feelings of "getting full" quickly w/ oral intake but less so w/ liquids. Discussed general Reflux precautions including food consistencies to lessen/avoid(meats/breads) as well as food prep and other reflux precautions. Recommended GI f/u to further assessment Esophageal Dysmotility as any potential regurgitation could negatively impact pulmonary status and decrease oral intake overall. Pt appears at his baseline w/ oropharyngeal phase swallow function.  Addendum: NO oral phase deficits were noted. Also, pt endorsed GI Esophageal Dysmotility ~15 years ago and endorses feelings of such currently.  SLP Visit Diagnosis: Dysphagia, pharyngoesophageal phase (R13.14) (Esophageal Dysmotility)    Aspiration Risk  Mild aspiration risk (from regurgiated material; Reflux)    Diet Recommendation  Regular diet w/ thin liquids; general aspiration precautions; REFLUX precautions  Medication Administration: Whole meds with liquid (as tolerates or whole in a puree if easier)    Other  Recommendations Recommended Consults: Consider GI evaluation;Consider esophageal assessment (Dietician f/u) Oral Care Recommendations: Oral care BID;Staff/trained caregiver to  provide oral care;Patient independent with  oral care   Follow up Recommendations None      Frequency and Duration  n/a         Prognosis Prognosis for Safe Diet Advancement: Good Barriers to Reach Goals:  (overall deconditioning)      Swallow Study   General Date of Onset: 03/30/17 HPI: Pt is a 36 y.o. male with a known history of Major depression, diastolic congestive heart failure, systemic hypertension, primary pulmonary hypertension, hypothyroidism, bilateral lymphedema, obesity, CK D stage III, anxiety disorder presents from [facility] secondary to dyspnea and acute renal failure. Patient has been living at [facility] for almost 4 months now. He is mostly wheelchair bound and also uses a walker. His ambulation has been significantly decreased in the last 2 months. He has had bilateral lymphedema for several years now. Had a right heart catheterization at [facility] in 2016 showing primary pulmonary hypertension and diastolic CHF. He has been on Lasix and metolazone. He says his urine output has been normal. He has trouble with hesitancy. Pt has been receiving hemodialysis 3x week. MD is concerned about overall muscle weakness and ordered a swallow evaluation to r/o any deficits. He has had a thorocentesis and paracentesis to remove fluid since admission. Of note, pt does describe GI Esophageal Dysmotility ~15 years ago when he was a teenager and received a GI assessment then.  Type of Study: Bedside Swallow Evaluation Previous Swallow Assessment: none Diet Prior to this Study: Regular;Thin liquids Temperature Spikes Noted: No (wbc 8.0) Respiratory Status: Nasal cannula (3 liters) History of Recent Intubation: No Behavior/Cognition: Alert;Cooperative;Pleasant mood Oral Cavity Assessment: Within Functional Limits Oral Care Completed by SLP: Recent completion by staff Oral Cavity - Dentition: Adequate natural dentition Vision: Functional for self-feeding Self-Feeding Abilities: Able to feed self;Needs set up (min  d/t being in bed, weakness overall) Patient Positioning: Upright in bed;Postural control adequate for testing Baseline Vocal Quality: Normal Volitional Cough: Strong Volitional Swallow: Able to elicit    Oral/Motor/Sensory Function Overall Oral Motor/Sensory Function: Within functional limits   Ice Chips Ice chips: Not tested   Thin Liquid Thin Liquid: Within functional limits Presentation: Self Fed (bottle; 8 trials)    Nectar Thick Nectar Thick Liquid: Not tested   Honey Thick Honey Thick Liquid: Not tested   Puree Puree: Within functional limits Presentation: Self Fed;Spoon (5 trials)   Solid   GO   Solid: Within functional limits Presentation: Self Fed;Spoon (2 trials from lunch meal) Other Comments: meat/rice        Jerilynn SomKatherine Nelson Noone, MS, CCC-SLP Bookert Guzzi 04/16/2017,2:16 PM

## 2017-04-16 NOTE — Discharge Summary (Signed)
[employer] [facility] [place] at [facility]   PATIENT NAME: [name]    MR#:  [id number]  DATE OF BIRTH:  [birth year]  DATE OF ADMISSION:  03/30/2017   ADMITTING PHYSICIAN: Enid Baas, MD  DATE OF DISCHARGE: 04/16/2017 PRIMARY CARE PHYSICIAN: Patient, No Pcp Per   ADMISSION DIAGNOSIS:  Swelling [R60.9] ARF (acute renal failure) (HCC) [N17.9] Dyspnea [R06.00] AKI (acute kidney injury) (HCC) [N17.9] Dyspnea, unspecified type [R06.00] DISCHARGE DIAGNOSIS:  Active Problems:   ARF (acute renal failure) (HCC)   Acute on chronic respiratory failure with hypoxia and hypercapnia (HCC)   Pleural effusion, bilateral   Postprocedural pneumothorax   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 CKD (chronic kidney disease)   . Depression   . Diastolic CHF (HCC)   . Generalized anxiety disorder   . Hyperlipidemia   . Hypertension   . Hypertension   . Hypothyroidism   . Lymphedema   . Obesity   . Pulmonary hypertension [facility])    HOSPITAL COURSE:   36 year old male with history of chronic diastolic heart failure, primary pulmonary hypertension and hypothyroidism with chronic kidney disease stage III who presents for [facility] due to shortness of breath.   1. Acute on chronic hypoxic respiratory failure with hypoxia in the setting of fluid overload as well as small pneumothorax After thoracentesis Patient titrated off of BiPAP and on nasal cannula 4L. Pneumothorax has resolved  On BiPAP prn. NEB. continueO2 [place]. Continue NEB. BIPAP prn.  Obesity Hypoventilation Syndrome. BIPAP prn.  * Bilateral moderate to large pleural effusion. S/p US guided thoracentesis. 2 liters of clear, yellowish fluid removed. CXR: Small bilateral pleural effusions, right greater the left, significantly decreased on the left and stable on the right.  2 Acute on chronic kidney failure Stage III: progression  of chronic kidney disease to chronic kidney disease stage V Renal ultrasound showed no hydronephrosis Patient Has started dialysis which was initiated August 20. He is treated wtih IV albumin during HD. Per Dr. Cherylann Ratel, continue dialysis and consult with interventional radiology regarding for kidney biopsy.  Per radiologist, Image guided renal biopsy will be very difficult with this patient due to his body habitus and may not be technically feasible. In addition, I would be very concerned about managing any bleeding complication after the biopsy. Patient would likely need to be off anticoagulation for at least 24 hours after a renal biopsy. Discussed with Dr. Cherylann Ratel and feel that the risks of a renal biopsy at this time are too high and will not proceed with biopsy.   * Ascites. Large amount of ascites per [place].  S/P US guided paracentesis this am (August 29), yielding 5 liters of peritoneal fluid. S/p U/s guideded paracentesis status post 2.8 L of fluid on August 20.  3. Acute on chronic diastolic heart failure and pulmonary hypertension and anasarca Continue hemodialysis  Echocardiogram shows normal ejection fraction without major valvular abnormalities.  4. Left thigh wound:  Status post bedside debridement by surgeon. MULTIPLE ORGANISMS PRESENT, NONE PREDOMINANT. NO ANAEROBES ISOLATED per wound cultures.  On vancomycin and cefepime and stopped antibiotics on August 25. Pressure offloading Daily dressing changes with Santyl.  4. Accelerated Essential hypertension: Continue irbesartan and Bisoprolol, resumed Clonidine due to accelerated hypertension, but discontinued since SBP goal 130-150 to allow for adequate fluid removal with HD per Dr. Thedore Mins.  5. Hypothyroid: Continue Synthroid 6. Hyperlipidemia: Continue statin 7. 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 Eliquis is hold (since  August 29) and started heparin drip for biosy ( deltoid muscle)  Anemia of chronic disease. Hemoglobin decreased to 6.8. He got 1 unit PRBC transfusion, follow-up hemoglobin up to 9.1. Hb 8.7 today. F/u Hb.  Generalized weakness with muscle wasting CK 32, Aldolase 7.3. Hold statin. Per Dr. Thad Ranger, may need EMG as outpatient. Rheumatology consult. Muscle biopsy per Dr. Gavin Potters. Need hold Eliquis for 3-4 days and startedheparin drip. Dr. Excell Seltzer discussed with pathologist, there is no servicefor muscle biopsy in this hospital. The patient needs deltoid muscle biopsy and may transfer to [facility] per Dr. Cherylann Ratel.  I discussed [facility] transfer center and discussed with [facility] hospitalist Dr. Ivonne Andrew.he kindly accepted the patient. Still no bed is available.  Discussed with Dr. Cherylann Ratel and Dr. Gavin Potters.  DISCHARGE CONDITIONS:  Discharge to [facility] hospital if bed is available. CONSULTS OBTAINED:  Treatment Team:  Lamont Dowdy, MD Thana Farr, MD Kandyce Rud., MD DRUG ALLERGIES:  No Known Allergies DISCHARGE MEDICATIONS:   Allergies as of 04/16/2017   No Known Allergies     Medication List    STOP taking these medications   atorvastatin 20 MG tablet Commonly known as:  LIPITOR   cloNIDine 0.2 MG tablet Commonly known as:  CATAPRES   hydrALAZINE 50 MG tablet Commonly known as:  APRESOLINE   metolazone 2.5 MG tablet Commonly known as:  ZAROXOLYN     TAKE these medications   amLODipine 5 MG tablet Commonly known as:  NORVASC Take 5 mg by mouth daily. What changed:  Another medication with the same name was removed. Continue taking this medication, and follow the directions you see here.   aspirin 325 MG EC tablet Take 325 mg by mouth daily.   bisoprolol 10 MG tablet Commonly known as:  ZEBETA Take 10 mg by mouth daily.   buPROPion 100 MG tablet Commonly known as:  WELLBUTRIN Take 100 mg by mouth 2 (two) times daily.   docusate sodium 100 MG  capsule Commonly known as:  COLACE Take 1 capsule (100 mg total) by mouth 2 (two) times daily.   ELIQUIS 5 MG Tabs tablet Generic drug:  apixaban Take 5 mg by mouth 2 (two) times daily.   ferrous sulfate 325 (65 FE) MG tablet Take 1 tablet (325 mg total) by mouth daily at 12 noon.   irbesartan 300 MG tablet Commonly known as:  AVAPRO Take 1 tablet (300 mg total) by mouth at bedtime.   levothyroxine 75 MCG tablet Commonly known as:  SYNTHROID, LEVOTHROID Take 75 mcg by mouth daily before breakfast.   multivitamin Tabs tablet Take 1 tablet by mouth at bedtime.   nystatin powder Commonly known as:  MYCOSTATIN/NYSTOP Apply topically 2 (two) times daily.   ondansetron 8 MG tablet Commonly known as:  ZOFRAN Take by mouth every 8 (eight) hours as needed for nausea or vomiting.   potassium chloride SA 20 MEQ tablet Commonly known as:  K-DUR,KLOR-CON Take 20 mEq by mouth every other day.   SANTYL ointment Generic drug:  collagenase Apply 1 application topically daily.   traMADol 50 MG tablet Commonly known as:  ULTRAM Take 50 mg by mouth every 8 (eight) hours as needed.            Discharge Care Instructions        Start     Ordered   04/14/17 0000  ferrous  sulfate 325 (65 FE) MG tablet  Daily     04/13/17 1309   04/13/17 0000  irbesartan (AVAPRO) 300 MG tablet  Daily at bedtime     04/13/17 1309   04/13/17 0000  multivitamin (RENA-VIT) TABS tablet  Daily at bedtime     04/13/17 1309   04/13/17 0000  docusate sodium (COLACE) 100 MG capsule  2 times daily     04/13/17 1309       DISCHARGE INSTRUCTIONS:  See AVS.  If you experience worsening of your admission symptoms, develop shortness of breath, life threatening emergency, suicidal or homicidal thoughts you must seek medical attention immediately by calling 911 or calling your MD immediately  if symptoms less severe.  You Must read complete instructions/literature along with all the possible adverse  reactions/side effects for all the Medicines you take and that have been prescribed to you. Take any new Medicines after you have completely understood and accpet all the possible adverse reactions/side effects.   Please note  You were cared for by a hospitalist during your hospital stay. If you have any questions about your discharge medications or the care you received while you were in the hospital after you are discharged, you can call the unit and asked to speak with the hospitalist on call if the hospitalist that took care of you is not available. Once you are discharged, your primary care physician will handle any further medical issues. Please note that NO REFILLS for any discharge medications will be authorized once you are discharged, as it is imperative that you return to your primary care physician (or establish a relationship with a primary care physician if you do not have one) for your aftercare needs so that they can reassess your need for medications and monitor your lab values.    On the day of Discharge:  VITAL SIGNS:  Blood pressure 134/86, pulse 62, temperature 98.2 F (36.8 C), temperature source Oral, resp. rate 20, height 5\' 6"  (1.676 m), weight (!) 334 lb (151.5 kg), SpO2 100 %. PHYSICAL EXAMINATION:  GENERAL:  36 y.o.-year-old patient lying in the bed with no acute distress. Morbid obesity. EYES: Pupils equal, round, reactive to light and accommodation. No scleral icterus. Extraocular muscles intact.  HEENT: Head atraumatic, normocephalic. Oropharynx and nasopharynx clear.  NECK:  Supple, no jugular venous distention. No thyroid enlargement, no tenderness.  LUNGS: Normal breath sounds bilaterally, no wheezing, rales,rhonchi or crepitation. No use of accessory muscles of respiration.  CARDIOVASCULAR: S1, S2 normal. No murmurs, rubs, or gallops.  ABDOMEN: Soft, non-tender, non-distended. Bowel sounds present. Morbid obesity. EXTREMITIES: No cyanosis, or clubbing. Better  anasarca. NEUROLOGIC: Cranial nerves II through XII are intact.muscle atrophy.  Muscle strength 3/5 in all extremities. Sensation intact. Gait not checked. left thigh wound in dressing. PSYCHIATRIC: The patient is alert and oriented x 3.  SKIN: No obvious rash, lesion, or ulcer.  DATA REVIEW:   CBC  Recent Labs Lab 04/16/17 0333  WBC 8.0  HGB 8.7*  HCT 27.5*  PLT 373    Chemistries   Recent Labs Lab 04/11/17 0500 04/12/17 0421  NA 142 141  K 3.7 3.9  CL 102 102  CO2 31 31  GLUCOSE 97 99  BUN 53* 58*  CREATININE 4.28* 4.74*  CALCIUM 7.2* 7.5*  MG 1.8  --      Microbiology Results  Results for orders placed or performed during the hospital encounter of 03/30/17  Aerobic/Anaerobic Culture (surgical/deep wound)     Status: Abnormal  Collection Time: 03/30/17  7:56 PM  Result Value Ref Range Status   Specimen Description WOUND LEFT THIGH  Final   Special Requests NONE  Final   Gram Stain   Final    ABUNDANT WBC PRESENT, PREDOMINANTLY PMN ABUNDANT GRAM POSITIVE COCCI ABUNDANT GRAM NEGATIVE RODS MODERATE GRAM POSITIVE RODS MODERATE GRAM NEGATIVE COCCI    Culture (A)  Final    MULTIPLE ORGANISMS PRESENT, NONE PREDOMINANT NO ANAEROBES ISOLATED Performed at [facility], [address] [place], [place] [address]    Report Status 04/05/2017 FINAL  Final  MRSA PCR Screening     Status: Abnormal   Collection Time: 03/30/17  8:58 PM  Result Value Ref Range Status   MRSA by PCR POSITIVE (A) NEGATIVE Final    Comment:        The GeneXpert MRSA Assay (FDA approved for NASAL specimens only), is one component of a comprehensive MRSA colonization surveillance program. It is not intended to diagnose MRSA infection nor to guide or monitor treatment for MRSA infections. RESULT CALLED TO, READ BACK BY AND VERIFIED WITH: KARA CAMPBELL AT 2230 03/30/17.PMH   Body fluid culture     Status: None   Collection Time: 04/02/17 11:50 AM  Result Value Ref Range Status    Specimen Description PLEURAL  Final   Special Requests NONE  Final   Gram Stain NO WBC SEEN NO ORGANISMS SEEN   Final   Culture   Final    NO GROWTH 3 DAYS Performed at [facility], [address], [place], [place] [address]    Report Status 04/05/2017 FINAL  Final  Acid Fast Smear (AFB)     Status: None   Collection Time: 04/02/17 11:50 AM  Result Value Ref Range Status   AFB Specimen Processing Concentration  Final   Acid Fast Smear Negative  Final    Comment: (NOTE) Performed At: [facility] [address] [place], [place] [id number] Mila Homer MD WG:[id number]    Source (AFB) PLEURAL  Final    RADIOLOGY:  No results found.   Management plans discussed with the patient, family and they are in agreement.  CODE STATUS: Full Code   TOTAL TIME TAKING CARE OF THIS PATIENT: 33 minutes.    Shaune Pollack M.D on 04/16/2017 at 3:52 PM  Between 7am to 6pm - Pager - [phone]  After 6pm go to [email or web address] - Social research officer, government  [facility] [place] [facility]  Office  [phone]  CC: Primary care physician; Patient, No Pcp Per   Note: This dictation was prepared with Dragon dictation along with smaller phrase technology. Any transcriptional errors that result from this process are unintentional.

## 2017-04-16 NOTE — Progress Notes (Signed)
[facility]  ROUNDING NOTE   Subjective:  Urine output was 1 L over the preceding 24 hours. No new renal function testing today. Pending to [facility] still pending.  Objective:  Vital signs in last 24 hours:  Temp:  [97.9 F (36.6 C)-98.8 F (37.1 C)] 98.8 F (37.1 C) (09/03 0452) Pulse Rate:  [63-76] 63 (09/03 0452) Resp:  [16-18] 16 (09/03 0452) BP: (130-158)/(76-91) 130/76 (09/03 0452) SpO2:  [99 %-100 %] 100 % (09/03 0452)  Weight change:  Filed Weights    Intake/Output: I/O last 3 completed shifts: In: 1515.4 [P.O.:360; I.V.:798.4; NG/GT:357] Out: 1600 [Urine:1600]   Intake/Output this shift:  Total I/O In: 55.8 [I.V.:55.8] Out: -   Physical Exam: General: Chronically ill-appearing, laying in bed  Head: +temporal wasting  Eyes: Anicteric  Neck: supple  Lungs:  Diminished bilaterally, Normal effort   Heart: S1S2 no rubs  Abdomen:  Soft, nontender, obese, pannus noted  Extremities: 2+ b/l LE edema  Neurologic: significant generalized muscle wasting  Skin: Prurigo lesions over legs and thighs, dressing on thighs  Access: Rt IJ temp cath    Basic Metabolic Panel:  Recent Labs Lab 04/10/17 1009 04/11/17 0500 04/12/17 0421 04/14/17 1246  NA  --  142 141  --   K  --  3.7 3.9  --   CL  --  102 102  --   CO2  --  31 31  --   GLUCOSE  --  97 99  --   BUN  --  53* 58*  --   CREATININE  --  4.28* 4.74*  --   CALCIUM  --  7.2* 7.5*  --   MG  --  1.8  --   --   PHOS 6.5* 5.3*  --  5.1*    Liver Function Tests: No results for input(s): AST, ALT, ALKPHOS, BILITOT, PROT, ALBUMIN in the last 168 hours. No results for input(s): LIPASE, AMYLASE in the last 168 hours. No results for input(s): AMMONIA in the last 168 hours.  CBC:  Recent Labs Lab 04/12/17 0421 04/14/17 0031 04/14/17 0614 04/15/17 0451 04/16/17 0333  WBC 12.3* 11.0* 9.1 8.3 8.0  HGB 8.9* 10.3* 9.1* 8.4* 8.7*  HCT 27.9* 32.5* 28.1* 25.9* 27.5*  MCV 82.9 85.2 82.9  83.2 84.7  PLT 393 418 405 354 373    Cardiac Enzymes:  Recent Labs Lab 04/11/17 1634  CKTOTAL 32*    BNP: Invalid input(s): POCBNP  CBG: No results for input(s): GLUCAP in the last 168 hours.  Microbiology: Results for orders placed or performed during the hospital encounter of 03/30/17  Aerobic/Anaerobic Culture (surgical/deep wound)     Status: Abnormal   Collection Time: 03/30/17  7:56 PM  Result Value Ref Range Status   Specimen Description WOUND LEFT THIGH  Final   Special Requests NONE  Final   Gram Stain   Final    ABUNDANT WBC PRESENT, PREDOMINANTLY PMN ABUNDANT GRAM POSITIVE COCCI ABUNDANT GRAM NEGATIVE RODS MODERATE GRAM POSITIVE RODS MODERATE GRAM NEGATIVE COCCI    Culture (A)  Final    MULTIPLE ORGANISMS PRESENT, NONE PREDOMINANT NO ANAEROBES ISOLATED Performed at [facility], [address], [place], [place] [address]    Report Status 04/05/2017 FINAL  Final  MRSA PCR Screening     Status: Abnormal   Collection Time: 03/30/17  8:58 PM  Result Value Ref Range Status   MRSA by PCR POSITIVE (A) NEGATIVE Final    Comment:  The GeneXpert MRSA Assay (FDA approved for NASAL specimens only), is one component of a comprehensive MRSA colonization surveillance program. It is not intended to diagnose MRSA infection nor to guide or monitor treatment for MRSA infections. RESULT CALLED TO, READ BACK BY AND VERIFIED WITH: KARA CAMPBELL AT 2230 03/30/17.PMH   Body fluid culture     Status: None   Collection Time: 04/02/17 11:50 AM  Result Value Ref Range Status   Specimen Description PLEURAL  Final   Special Requests NONE  Final   Gram Stain NO WBC SEEN NO ORGANISMS SEEN   Final   Culture   Final    NO GROWTH 3 DAYS Performed at [facility], [address] [place], [place] [address]    Report Status 04/05/2017 FINAL  Final  Acid Fast Smear (AFB)     Status: None   Collection Time: 04/02/17 11:50 AM  Result Value Ref Range Status    AFB Specimen Processing Concentration  Final   Acid Fast Smear Negative  Final    Comment: (NOTE) Performed At: [facility] [address] [place], [place] [id number] Mila HomerHancock William F MD HQ:[id number]:[phone]    Source (AFB) PLEURAL  Final    Coagulation Studies: No results for input(s): LABPROT, INR in the last 72 hours.  Urinalysis: No results for input(s): COLORURINE, LABSPEC, PHURINE, GLUCOSEU, HGBUR, BILIRUBINUR, KETONESUR, PROTEINUR, UROBILINOGEN, NITRITE, LEUKOCYTESUR in the last 72 hours.  Invalid input(s): APPERANCEUR    Imaging: No results found.   Medications:   . albumin human 25 g (04/14/17 1245)  . heparin 2,100 Units/hr (04/16/17 0402)   . amLODipine  5 mg Oral Daily  . bisoprolol  10 mg Oral Daily  . buPROPion  100 mg Oral BID  . collagenase   Topical Daily  . docusate sodium  100 mg Oral BID  . feeding supplement (NEPRO CARB STEADY)  237 mL Oral TID BM  . ferrous sulfate  325 mg Oral Q1200  . irbesartan  300 mg Oral QHS  . levothyroxine  75 mcg Oral QAC breakfast  . multivitamin  1 tablet Oral QHS   acetaminophen **OR** [DISCONTINUED] acetaminophen, albumin human, hydrALAZINE, ipratropium-albuterol, lip balm, LORazepam, morphine injection, [DISCONTINUED] ondansetron **OR** ondansetron (ZOFRAN) IV, traMADol  Assessment/ Plan:  [name] is a 36 y.o. white male with hypertension, morbid obesity, pulmonary hypertension, hypothyroidism, hyperlipidemia, depression/anxiety, coronary artery disease, congestive heart failure, who was [facility]  1. Acute renal failure on chronic kidney disease stage III with nephrotic range proteinuria, hematuria and glucosuria No baseline creatinine available. Creatinine of 1.3 in 12/2014 (care everywhere).  Concern for progression of chronic kidney disease to CKD stage V.  Patient has Nephrotic syndrome of unknown etiology. He states he has been dealing with edema for over 2-3 yeas but did not seek  medical care. Renal ultrasound report reviewed. HIV negative Not a candidate for kidney biopsy due to body habitus/ in ability to lie prone/ current infection, patient was also evaluated by interventional radiology for renal biopsy, they also felt that risks outweigh the benefits in terms of renal biopsy and that renal biopsy would be technically very difficult.  - 1st HD on 8/20; PPD placed on 8/20 -  Patient last had dialysis on Saturday. We will plan for dialysis again on Tuesday.  2. Anasarca, with HTN. Echocardiogram with normal systolic function 8/18. No visualization of hepatic cirrhosis on CT abd/pelvis -  Continue to perform ultrafiltration with hemodialysis.  3. Secondary hyperparathyroidism.  PTH 60  - Recheck serum phosphorus with next dialysis treatment.  4. Anemia of chronic kidney disease: Hemoglobin currently 8.7. Continue to monitor. In addition we will consider Epogen with dialysis.  5. Generalized weakness with muscle wasting Neurology and rheumatology evaluation performed. - hold statin - Appreciate rheumatology input. Patient needs deltoid muscle biopsy, however it appears that this cannot be done here, patient being transferred to [facility] for this.  6. B/l pleural effusion -  Thoracentesis performed 04/09/2017.    LOS: 17 Juandedios Dudash 9/3/201810:57 AM

## 2017-04-21 ENCOUNTER — Encounter: Payer: Self-pay | Admitting: Internal Medicine

## 2017-04-21 ENCOUNTER — Inpatient Hospital Stay
Admission: AD | Admit: 2017-04-21 | Discharge: 2017-05-02 | DRG: 682 | Disposition: A | Discharge status: Home / Self Care | Payer: Medicaid Other | Source: Ambulatory Visit | Attending: Internal Medicine | Admitting: Internal Medicine

## 2017-04-21 DIAGNOSIS — L89899 Pressure ulcer of other site, unspecified stage: Secondary | ICD-10-CM | POA: Diagnosis present

## 2017-04-21 DIAGNOSIS — E039 Hypothyroidism, unspecified: Secondary | ICD-10-CM | POA: Diagnosis present

## 2017-04-21 DIAGNOSIS — N179 Acute kidney failure, unspecified: Principal | ICD-10-CM | POA: Diagnosis present

## 2017-04-21 DIAGNOSIS — N2581 Secondary hyperparathyroidism of renal origin: Secondary | ICD-10-CM | POA: Diagnosis present

## 2017-04-21 DIAGNOSIS — I132 Hypertensive heart and chronic kidney disease with heart failure and with stage 5 chronic kidney disease, or end stage renal disease: Secondary | ICD-10-CM | POA: Diagnosis present

## 2017-04-21 DIAGNOSIS — Z8249 Family history of ischemic heart disease and other diseases of the circulatory system: Secondary | ICD-10-CM

## 2017-04-21 DIAGNOSIS — I272 Pulmonary hypertension, unspecified: Secondary | ICD-10-CM | POA: Diagnosis present

## 2017-04-21 DIAGNOSIS — Z992 Dependence on renal dialysis: Secondary | ICD-10-CM | POA: Diagnosis not present

## 2017-04-21 DIAGNOSIS — J9621 Acute and chronic respiratory failure with hypoxia: Secondary | ICD-10-CM | POA: Diagnosis present

## 2017-04-21 DIAGNOSIS — R188 Other ascites: Secondary | ICD-10-CM | POA: Diagnosis present

## 2017-04-21 DIAGNOSIS — I2510 Atherosclerotic heart disease of native coronary artery without angina pectoris: Secondary | ICD-10-CM | POA: Diagnosis present

## 2017-04-21 DIAGNOSIS — Z6833 Body mass index (BMI) 33.0-33.9, adult: Secondary | ICD-10-CM | POA: Diagnosis not present

## 2017-04-21 DIAGNOSIS — F329 Major depressive disorder, single episode, unspecified: Secondary | ICD-10-CM | POA: Diagnosis present

## 2017-04-21 DIAGNOSIS — N049 Nephrotic syndrome with unspecified morphologic changes: Secondary | ICD-10-CM | POA: Diagnosis present

## 2017-04-21 DIAGNOSIS — I82511 Chronic embolism and thrombosis of right femoral vein: Secondary | ICD-10-CM | POA: Diagnosis present

## 2017-04-21 DIAGNOSIS — G8929 Other chronic pain: Secondary | ICD-10-CM | POA: Diagnosis present

## 2017-04-21 DIAGNOSIS — R0600 Dyspnea, unspecified: Secondary | ICD-10-CM

## 2017-04-21 DIAGNOSIS — F411 Generalized anxiety disorder: Secondary | ICD-10-CM | POA: Diagnosis present

## 2017-04-21 DIAGNOSIS — R319 Hematuria, unspecified: Secondary | ICD-10-CM | POA: Diagnosis present

## 2017-04-21 DIAGNOSIS — R81 Glycosuria: Secondary | ICD-10-CM | POA: Diagnosis present

## 2017-04-21 DIAGNOSIS — Z7722 Contact with and (suspected) exposure to environmental tobacco smoke (acute) (chronic): Secondary | ICD-10-CM | POA: Diagnosis present

## 2017-04-21 DIAGNOSIS — E43 Unspecified severe protein-calorie malnutrition: Secondary | ICD-10-CM | POA: Insufficient documentation

## 2017-04-21 DIAGNOSIS — Z7901 Long term (current) use of anticoagulants: Secondary | ICD-10-CM

## 2017-04-21 DIAGNOSIS — R0603 Acute respiratory distress: Secondary | ICD-10-CM

## 2017-04-21 DIAGNOSIS — N186 End stage renal disease: Secondary | ICD-10-CM | POA: Diagnosis present

## 2017-04-21 DIAGNOSIS — E785 Hyperlipidemia, unspecified: Secondary | ICD-10-CM | POA: Diagnosis present

## 2017-04-21 DIAGNOSIS — Z7982 Long term (current) use of aspirin: Secondary | ICD-10-CM

## 2017-04-21 DIAGNOSIS — Z993 Dependence on wheelchair: Secondary | ICD-10-CM | POA: Diagnosis not present

## 2017-04-21 DIAGNOSIS — D631 Anemia in chronic kidney disease: Secondary | ICD-10-CM | POA: Diagnosis present

## 2017-04-21 DIAGNOSIS — I5032 Chronic diastolic (congestive) heart failure: Secondary | ICD-10-CM | POA: Diagnosis present

## 2017-04-21 HISTORY — DX: Nephrotic syndrome with unspecified morphologic changes: N04.9

## 2017-04-21 MED ORDER — COLLAGENASE 250 UNIT/GM EX OINT
1.0000 "application " | TOPICAL_OINTMENT | Freq: Every day | CUTANEOUS | Status: DC
  Administered 2017-04-22 – 2017-05-02 (×11): 1 via TOPICAL
  Filled 2017-04-21 (×2): qty 30

## 2017-04-21 MED ORDER — SERTRALINE HCL 50 MG PO TABS
50.0000 mg | ORAL_TABLET | Freq: Every day | ORAL | Status: DC
  Administered 2017-04-22 – 2017-05-02 (×11): 50 mg via ORAL
  Filled 2017-04-21 (×11): qty 1

## 2017-04-21 MED ORDER — LEVOTHYROXINE SODIUM 50 MCG PO TABS
75.0000 ug | ORAL_TABLET | Freq: Every day | ORAL | Status: DC
  Administered 2017-04-22: 75 ug via ORAL
  Filled 2017-04-21 (×2): qty 1.5

## 2017-04-21 MED ORDER — BUPROPION HCL 100 MG PO TABS
100.0000 mg | ORAL_TABLET | Freq: Two times a day (BID) | ORAL | Status: DC
  Administered 2017-04-21 – 2017-04-22 (×3): 100 mg via ORAL
  Filled 2017-04-21 (×6): qty 1

## 2017-04-21 MED ORDER — ACETAMINOPHEN 325 MG PO TABS: 650.0000 mg | ORAL_TABLET | Freq: Four times a day (QID) | ORAL | Status: DC | PRN

## 2017-04-21 MED ORDER — APIXABAN 5 MG PO TABS
5.0000 mg | ORAL_TABLET | Freq: Two times a day (BID) | ORAL | Status: DC
  Administered 2017-04-21 – 2017-04-24 (×6): 5 mg via ORAL
  Filled 2017-04-21 (×6): qty 1

## 2017-04-21 MED ORDER — FERROUS SULFATE 325 (65 FE) MG PO TABS
325.0000 mg | ORAL_TABLET | Freq: Every day | ORAL | Status: DC
  Administered 2017-04-22: 325 mg via ORAL
  Filled 2017-04-21: qty 1

## 2017-04-21 MED ORDER — ACETAMINOPHEN 650 MG RE SUPP: 650.0000 mg | Freq: Four times a day (QID) | RECTAL | Status: DC | PRN

## 2017-04-21 MED ORDER — DOCUSATE SODIUM 100 MG PO CAPS
100.0000 mg | ORAL_CAPSULE | Freq: Two times a day (BID) | ORAL | Status: DC
Start: 2017-04-21 — End: 2017-05-02
  Administered 2017-04-21 – 2017-05-01 (×9): 100 mg via ORAL
  Filled 2017-04-21 (×18): qty 1

## 2017-04-21 MED ORDER — IRBESARTAN 150 MG PO TABS
300.0000 mg | ORAL_TABLET | Freq: Every day | ORAL | Status: DC
  Administered 2017-04-21 – 2017-05-01 (×11): 300 mg via ORAL
  Filled 2017-04-21 (×11): qty 2

## 2017-04-21 MED ORDER — ONDANSETRON HCL 4 MG PO TABS: 4.0000 mg | ORAL_TABLET | Freq: Four times a day (QID) | ORAL | Status: DC | PRN

## 2017-04-21 MED ORDER — TRAMADOL HCL 50 MG PO TABS
50.0000 mg | ORAL_TABLET | Freq: Four times a day (QID) | ORAL | Status: DC | PRN
  Administered 2017-04-21 – 2017-04-22 (×2): 50 mg via ORAL
  Filled 2017-04-21 (×2): qty 1

## 2017-04-21 MED ORDER — ONDANSETRON HCL 4 MG/2ML IJ SOLN
4.0000 mg | Freq: Four times a day (QID) | INTRAMUSCULAR | Status: DC | PRN
  Filled 2017-04-21: qty 2

## 2017-04-21 MED ORDER — RENA-VITE PO TABS
1.0000 | ORAL_TABLET | Freq: Every day | ORAL | Status: DC
  Administered 2017-04-21 – 2017-04-22 (×2): 1 via ORAL
  Filled 2017-04-21 (×2): qty 1

## 2017-04-21 MED ORDER — BISOPROLOL FUMARATE 5 MG PO TABS
10.0000 mg | ORAL_TABLET | Freq: Every day | ORAL | Status: DC
  Administered 2017-04-22 – 2017-05-02 (×11): 10 mg via ORAL
  Filled 2017-04-21 (×12): qty 2

## 2017-04-21 MED ORDER — AMLODIPINE BESYLATE 5 MG PO TABS
5.0000 mg | ORAL_TABLET | Freq: Every day | ORAL | Status: DC
  Administered 2017-04-22: 5 mg via ORAL
  Filled 2017-04-21: qty 1

## 2017-04-21 NOTE — H&P (Signed)
[employer] [facility] [facility] [place] [facility]   PATIENT NAME: Brian FlackJoseph [name]    MR#:  [id number]  DATE OF BIRTH:  February 25, 1981  DATE OF ADMISSION:  04/21/2017  PRIMARY CARE PHYSICIAN: Patient, No Pcp Per   REQUESTING/REFERRING PHYSICIAN: Dr. Luan MooreHaroldson from [facility]  CHIEF COMPLAINT:  No chief complaint on file.   HISTORY OF PRESENT ILLNESS:  [name]  is a 36 y.o. male with a known history of CK D with nephrotic range proteinuria, diastolic CHF, chronic lower extremity edema with ulcerations, pulmonary hypertension, generalized anxiety disorder and depression, hypertension who was recently admitted to [facility] from 03/30/2017 and was [facility] on 04[facility]16/2017 for rheumatology evaluation and consideration of a muscle biopsy is being sent back for further management. Patient has been living at the rehabilitation for almost 5 months now, he has morbid obesity on presentation, gradual worsening of lower extremity weakness resulting in wheelchair status. He was on Lasix and metolazone for his chronic edema which resulted in acute renal failure. During his most recent hospitalization here, he was started on hemodialysis. He was evaluated by rheumatology and neurology here at the hospital for his muscle weakness and they have recommended muscle biopsy. So he was [facility] for [facility] of muscle biopsy. However after neurology and rheumatology consultation over there, they felt that the muscle weakness is from severe deconditioning and severe malnutrition, and they have recommended against the biopsy. So patient is sent back to [facility] again for further management. He has lost significant amount of fluid weight. [facility] and also here prior to transfer and almost 12 L were taken out altogether. And also had left pleural effusion drawn out. Dialysis was continued over there. Eliquis was held for possible biopsies and he was on heparin drip which  has been discontinued prior to transfer. He still remains on 3-4 L of oxygen  PAST MEDICAL HISTORY:   Past Medical History:  Diagnosis Date  . CKD (chronic kidney disease)   . Depression   . Diastolic CHF (HCC)   . Generalized anxiety disorder   . Hyperlipidemia   . Hypertension   . Hypertension   . Hypothyroidism   . Lymphedema   . Nephrotic syndrome   . Obesity   . Pulmonary hypertension (HCC)     PAST SURGICAL HISTORY:   Past Surgical History:  Procedure Laterality Date  . CARDIAC CATHETERIZATION    . DIALYSIS/PERMA CATHETER INSERTION N/A 04/02/2017   Procedure: DIALYSIS/PERMA CATHETER INSERTION;  Surgeon: Annice Needyew, Jason S, MD;  Location: [facility] INVASIVE CV LAB;  Service: Cardiovascular;  Laterality: N/A;    SOCIAL HISTORY:   Social History  Substance Use Topics  . Smoking status: Passive Smoke Exposure - Never Smoker    Types: Cigarettes  . Smokeless tobacco: Never Used  . Alcohol use No    FAMILY HISTORY:   Family History  Problem Relation Age of Onset  . Hypertension Mother     DRUG ALLERGIES:  No Known Allergies  REVIEW OF SYSTEMS:   Review of Systems  Constitutional: Positive for malaise/fatigue. Negative for chills, fever and weight loss.  HENT: Negative for ear discharge, ear pain, hearing loss and nosebleeds.   Eyes: Negative for blurred vision, double vision and photophobia.  Respiratory: Positive for shortness of breath. Negative for cough, hemoptysis and wheezing.   Cardiovascular: Positive for leg swelling. Negative for chest pain, palpitations and orthopnea.  Gastrointestinal: Negative for abdominal pain, constipation, diarrhea, melena, nausea and vomiting.  Genitourinary: Negative for dysuria, frequency, hematuria and urgency.  Musculoskeletal: Positive for myalgias. Negative for back pain and neck pain.  Skin: Negative for rash.  Neurological: Negative for dizziness, tingling, sensory change, speech change, focal weakness and headaches.    Endo/Heme/Allergies: Does not bruise/bleed easily.  Psychiatric/Behavioral: Negative for depression.    MEDICATIONS AT HOME:   Prior to Admission medications   Medication Sig Start Date End Date Taking? Authorizing Provider  amLODipine (NORVASC) 5 MG tablet Take 5 mg by mouth daily.    [provider]  apixaban (ELIQUIS) 5 MG TABS tablet Take 5 mg by mouth 2 (two) times daily.    [provider]  aspirin 325 MG EC tablet Take 325 mg by mouth daily.    [provider]  bisoprolol (ZEBETA) 10 MG tablet Take 10 mg by mouth daily.    [provider]  buPROPion (WELLBUTRIN) 100 MG tablet Take 100 mg by mouth 2 (two) times daily.    [provider]  collagenase (SANTYL) ointment Apply 1 application topically daily.    [provider]  docusate sodium (COLACE) 100 MG capsule Take 1 capsule (100 mg total) by mouth 2 (two) times daily. 04/13/17   Shaune Pollack, MD  ferrous sulfate 325 (65 FE) MG tablet Take 1 tablet (325 mg total) by mouth daily at 12 noon. 04/14/17   Shaune Pollack, MD  irbesartan (AVAPRO) 300 MG tablet Take 1 tablet (300 mg total) by mouth at bedtime. 04/13/17   Shaune Pollack, MD  levothyroxine (SYNTHROID, LEVOTHROID) 75 MCG tablet Take 75 mcg by mouth daily before breakfast.    [provider]  multivitamin (RENA-VIT) TABS tablet Take 1 tablet by mouth at bedtime. 04/13/17   Shaune Pollack, MD  nystatin (MYCOSTATIN/NYSTOP) powder Apply topically 2 (two) times daily.    [provider]  ondansetron (ZOFRAN) 8 MG tablet Take by mouth every 8 (eight) hours as needed for nausea or vomiting.    [provider]  potassium chloride SA (K-DUR,KLOR-CON) 20 MEQ tablet Take 20 mEq by mouth every other day.    [provider]  traMADol (ULTRAM) 50 MG tablet Take 50 mg by mouth every 8 (eight) hours as needed.    [provider]      VITAL SIGNS:  Blood pressure 106/73, pulse 66, temperature 98.1 F (36.7 C),  temperature source Oral, resp. rate 16, SpO2 100 %.  PHYSICAL EXAMINATION:   Physical Exam  GENERAL:  36 y.o.-year-old patient lying in the bed with no acute distress.  EYES: Pupils equal, round, reactive to light and accommodation. No scleral icterus. Extraocular muscles intact.  HEENT: Head atraumatic, normocephalic. Oropharynx and nasopharynx clear. Bilateral temporal wasting noted NECK:  Supple, no jugular venous distention. No thyroid enlargement, no tenderness.  LUNGS: Normal breath sounds bilaterally, no wheezing, rales,rhonchi or crepitation. No use of accessory muscles of respiration. Significantly decreased basilar breath sounds with fine crackles CARDIOVASCULAR: S1, S2 normal. No murmurs, rubs, or gallops.  ABDOMEN: Soft, nontender, nondistended. Bowel sounds present. No organomegaly or mass.  EXTREMITIES: No  cyanosis, or clubbing. Lymphedema of both lower extremities much improved. Dressing noted on his anterior left thigh for MRSA wound NEUROLOGIC: Cranial nerves II through XII are intact. Muscle strength 4-/5 in all extremities. Sensation intact. Gait not checked.  PSYCHIATRIC: The patient is alert and oriented x 3.  SKIN: No obvious rash, lesion, or ulcer.   LABORATORY PANEL:   CBC  Recent Labs Lab 04/16/17 0333  WBC 8.0  HGB 8.7*  HCT 27.5*  PLT 373   ------------------------------------------------------------------------------------------------------------------  Chemistries  No results for input(s): NA, K, CL, CO2, GLUCOSE, BUN, CREATININE, CALCIUM, MG, AST, ALT, ALKPHOS, BILITOT in the last 168 hours.  Invalid input(s): GFRCGP ------------------------------------------------------------------------------------------------------------------  Cardiac Enzymes No results for input(s): TROPONINI in the last 168 hours. ------------------------------------------------------------------------------------------------------------------  RADIOLOGY:  No results  found.  EKG:   Orders placed or performed during the hospital encounter of 03/30/17  . EKG 12-Lead  . EKG 12-Lead  . ED EKG  . ED EKG    IMPRESSION AND PLAN:   [name]  is a 36 y.o. male with a known history of CK D with nephrotic range proteinuria, diastolic CHF, chronic lower extremity edema with ulcerations, pulmonary hypertension, generalized anxiety disorder and depression, hypertension who was recently admitted to [facility] from 03/30/2017 and was transferred to [facility] on 04/16/2017 for rheumatology evaluation and consideration of a muscle biopsy is being sent back for further management.  #1 acute renal failure on chronic kidney disease stage 3-appreciate nephrology consult, nephrology to resume following the patient. -Patient remains on Tuesday, Thursday and Saturday hemodialysis. Renal ultrasound showing no hydronephrosis but worsening renal atrophy. -Kidney biopsy was attempted at [facility], however unable to be done due to his body habitus and positioning. -If dialysis is considered to be long term, outpatient dialysis will need to be set up. -Has nephrotic range proteinuria of unknown cause  #2 acute hypoxic respiratory failure-secondary to diastolic CHF and pleural effusion. -And required BiPAP during last admission, currently on 4 L oxygen. -Follow up chest x-ray. Patient did get thoracentesis with 2 L removed prior to transfer to [facility]  #3 anasarca-on dialysis, status post pleural effusion (3L) remote and ascites (5L+ 7L) removed both at [facility] and here  #4 significant muscle weakness-neurology and rheumatology consults and the chart from [facility]. They did not consider muscle biopsy as they believe that the weakness is more from deconditioning and malnutrition rather than any inflammatory myositis. Lab work was done over there. please review chart for more details.  #5 hypertension-continue bisoprolol, irbesartan and Norvasc.  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 Resume eliquis at this time.  #7 acute on chronic anemia-received transfusion prior to transfer to [facility]. Monitor labs. -Epogen with dialysis and IV iron if needed with dialysis. -Continue oral supplements  Physical therapy consult. Social worker consult for return back to rehabilitation   All the records are reviewed and case discussed with ED provider. Management plans discussed with the patient, family and they are in agreement.  CODE STATUS: Full code  TOTAL TIME TAKING CARE OF THIS PATIENT: 55 minutes.    Enid Baas M.D on 04/21/2017 at 6:30 PM  Between 7am to 6pm - Pager - [phone]  After 6pm go to [email or web address] - password [email or web address]  [employer]  [employer]  Office  [phone]  CC: Primary care physician; Patient, No Pcp Per

## 2017-04-22 ENCOUNTER — Inpatient Hospital Stay: Payer: Medicaid Other

## 2017-04-22 LAB — CBC
HEMATOCRIT: 28.4 % — AB (ref 40.0–52.0)
Hemoglobin: 9 g/dL — ABNORMAL LOW (ref 13.0–18.0)
MCH: 26.4 pg (ref 26.0–34.0)
MCHC: 31.6 g/dL — AB (ref 32.0–36.0)
MCV: 83.6 fL (ref 80.0–100.0)
Platelets: 425 10*3/uL (ref 150–440)
RBC: 3.4 MIL/uL — ABNORMAL LOW (ref 4.40–5.90)
RDW: 16.6 % — AB (ref 11.5–14.5)
WBC: 19.5 10*3/uL — ABNORMAL HIGH (ref 3.8–10.6)

## 2017-04-22 LAB — BASIC METABOLIC PANEL
Anion gap: 10 (ref 5–15)
BUN: 21 mg/dL — AB (ref 6–20)
CHLORIDE: 101 mmol/L (ref 101–111)
CO2: 28 mmol/L (ref 22–32)
Calcium: 9 mg/dL (ref 8.9–10.3)
Creatinine, Ser: 2.74 mg/dL — ABNORMAL HIGH (ref 0.61–1.24)
GFR calc Af Amer: 33 mL/min — ABNORMAL LOW (ref >60)
GFR calc non Af Amer: 29 mL/min — ABNORMAL LOW (ref >60)
GLUCOSE: 90 mg/dL (ref 65–99)
POTASSIUM: 3.2 mmol/L — AB (ref 3.5–5.1)
Sodium: 139 mmol/L (ref 135–145)

## 2017-04-22 MED ORDER — HYDROCODONE-ACETAMINOPHEN 5-325 MG PO TABS
1.0000 | ORAL_TABLET | ORAL | Status: DC | PRN
  Administered 2017-04-22 – 2017-05-02 (×43): 1 via ORAL
  Filled 2017-04-22 (×43): qty 1

## 2017-04-22 MED ORDER — EPOETIN ALFA 10000 UNIT/ML IJ SOLN
10000.0000 [IU] | INTRAMUSCULAR | Status: DC
  Administered 2017-04-23 – 2017-04-27 (×3): 10000 [IU] via INTRAVENOUS
  Filled 2017-04-22: qty 1

## 2017-04-22 NOTE — Progress Notes (Signed)
[employer] - [place] at [facility]   PATIENT NAME: Brian FlackJoseph [name]    MR#:  [id number]  DATE OF BIRTH:  Aug 02, 1981  SUBJECTIVE:   Patient was recently in the hospital and transferred to [facility] and now returns back. He was there from September 3 to September 8. Patient noted to be in chronic kidney disease and due to body habitus was unable to get a renal bipsy.  As per rheumatology and neurology they thought his muscle wasting was related to deconditioning.  Pt. Complaining of pain in his Left leg wound and wants his pain meds adjusted.   REVIEW OF SYSTEMS:    Review of Systems  Constitutional: Negative for chills and fever.  HENT: Negative for congestion and tinnitus.   Eyes: Negative for blurred vision and double vision.  Respiratory: Negative for cough, shortness of breath and wheezing.   Cardiovascular: Negative for chest pain, orthopnea and PND.  Gastrointestinal: Negative for abdominal pain, diarrhea, nausea and vomiting.  Genitourinary: Negative for dysuria and hematuria.  Musculoskeletal: Positive for joint pain (left leg).  Neurological: Positive for weakness. Negative for dizziness, sensory change and focal weakness.  All other systems reviewed and are negative.   Nutrition: Renal Tolerating Diet: Yes Tolerating PT: Await Eval.   DRUG ALLERGIES:  No Known Allergies  VITALS:  Blood pressure (!) 148/81, pulse 64, temperature 98.1 F (36.7 C), temperature source Oral, resp. rate 18, height 5\' 9"  (1.753 m), weight 111.1 kg (245 lb), SpO2 100 %.  PHYSICAL EXAMINATION:   Physical Exam  GENERAL:  36 y.o.-year-old morbidly obese patient lying in bed in no acute distress.  EYES: Pupils equal, round, reactive to light and accommodation. No scleral icterus. Extraocular muscles intact.  HEENT: Head atraumatic, normocephalic. Oropharynx and nasopharynx clear.  NECK:  Supple, no jugular venous distention. No thyroid enlargement, no tenderness.  LUNGS: Normal breath  sounds bilaterally, no wheezing, rales, rhonchi. No use of accessory muscles of respiration.  CARDIOVASCULAR: S1, S2 normal. No murmurs, rubs, or gallops.  ABDOMEN: Soft, nontender, nondistended. Bowel sounds present. No organomegaly or mass.  EXTREMITIES: No cyanosis, clubbing or edema b/l.    NEUROLOGIC: Cranial nerves II through XII are intact. No focal Motor or sensory deficits b/l. Globally weak.    PSYCHIATRIC: The patient is alert and oriented x 3.  SKIN: No obvious rash, lesion, or ulcer.   Right chest wall temp dialysis cath in place.   LABORATORY PANEL:   CBC  Recent Labs Lab 04/22/17 0845  WBC 19.5*  HGB 9.0*  HCT 28.4*  PLT 425   ------------------------------------------------------------------------------------------------------------------  Chemistries   Recent Labs Lab 04/22/17 0845  NA 139  K 3.2*  CL 101  CO2 28  GLUCOSE 90  BUN 21*  CREATININE 2.74*  CALCIUM 9.0   ------------------------------------------------------------------------------------------------------------------  Cardiac Enzymes No results for input(s): TROPONINI in the last 168 hours. ------------------------------------------------------------------------------------------------------------------  RADIOLOGY:  Dg Chest 2 View  Result Date: 04/22/2017 CLINICAL DATA:  CKD, CHF, lower extremity edema with ulcerations, pulmonary hypertension, morbid obesity. Gradual worsening of lower extremity weakness. EXAM: CHEST  2 VIEW COMPARISON:  Chest x-ray dated 04/09/2017. FINDINGS: Stable cardiomegaly. Perihilar edema. Bilateral pleural effusions, small to moderate in size, with probable associated atelectasis. No pneumothorax seen. No acute or suspicious osseous finding. IMPRESSION: 1. Cardiomegaly with perihilar edema indicating CHF. 2. Bilateral pleural effusions, small to moderate in size. Suspect associated bibasilar atelectasis. Electronically Signed   By: Bary RichardStan  Maynard M.D.   On:  04/22/2017 08:45  ASSESSMENT AND PLAN:   [name]  is a 36 y.o. male with a known history of CK D with nephrotic range proteinuria, diastolic CHF, chronic lower extremity edema with ulcerations, pulmonary hypertension, generalized anxiety disorder and depression, hypertension who was recently admitted to [facility] from 03/30/2017 and was transferred to [facility] on 04/16/2017 for rheumatology evaluation and consideration of a muscle biopsy is being sent back for further management.  #1 acute renal failure on chronic kidney disease stage 3-appreciate nephrology consult, nephrology to resume following the patient. -Patient remains on Tuesday, Thursday and Saturday hemodialysis. Renal ultrasound showing no hydronephrosis but worsening renal atrophy. - Kidney biopsy was attempted at [facility], however unable to be done due to his body habitus and positioning. -If dialysis is considered to be long term, outpatient dialysis will need to be set up, but pt. Will need to be able to sit up with dialysis prior to discharge.  -Has nephrotic range proteinuria of unknown cause  #2 acute hypoxic respiratory failure-secondary to diastolic CHF and pleural effusion. -And required BiPAP during last admission, currently on 4 L oxygen. - X-ray from yesterday showing some mild CHF.  Patient did get thoracentesis with 2 L removed prior to transfer to [facility] and no acute need for thoracentesis now.   #3 anasarca-on dialysis, status post pleural effusion (3L) remote and ascites (5L+ 7L) removed both at [facility] and here  #4 significant muscle weakness- seen by Neurology and rheumatology at [facility]. They did not consider muscle biopsy as they believe that the weakness is more from deconditioning and malnutrition rather than any inflammatory myositis. Lab work was done over there.  #5 hypertension-continue bisoprolol, irbesartan and Norvasc.  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  - cont. Eliquis.   #7 acute on chronic anemia-received transfusion prior to transfer to [facility]. Monitor labs. -Epogen with dialysis and IV iron if needed with dialysis. -Continue oral supplements  Await PT consult and social work input regarding placement.    All the records are reviewed and case discussed with Care Management/Social Worker. Management plans discussed with the patient, family and they are in agreement.  CODE STATUS: Full code  DVT Prophylaxis: Eliquis  TOTAL TIME TAKING CARE OF THIS PATIENT: 30 minutes.   POSSIBLE D/C unclear, DEPENDING ON CLINICAL CONDITION.   Houston Siren M.D on 04/22/2017 at 2:31 PM  Between 7am to 6pm - Pager - [phone]  After 6pm go to [email or web address] - Social research officer, government  [facility] [place] [facility]  Office  [phone]  CC: Primary care physician; Patient, No Pcp Per

## 2017-04-22 NOTE — Progress Notes (Signed)
Labs drawn from the purple lumen in the Trialysis given to lab tech.

## 2017-04-22 NOTE — Clinical Social Work Note (Signed)
Clinical Social Work Assessment  Patient Details  Name: Brian FlackJoseph [name] MRN: [id number] Date of Birth: 08/31/1980  Date of referral:                  Reason for consult:                   Permission sought to share information with:    Permission granted to share information::     Name::        Agency::     Relationship::     Contact Information:     Housing/Transportation Living arrangements for the past 2 months:    Source of Information:    Patient Interpreter Needed:    Criminal Activity/Legal Involvement Pertinent to Current Situation/Hospitalization:    Significant Relationships:    Lives with:    Do you feel safe going back to the place where you live?    Need for family participation in patient care:     Care giving concerns:  Patient LTC at [facility]   Social Worker assessment / plan:  The patient has read[facility] from [facility] having transferred there from 9/3-9/8. The plan is for the patient to discharge back to [facility] once he is stable. CSW is following for discharge facilitation. Doug at [facility] is aware.  Employment status:    Insurance information:    PT Recommendations:    Information / Referral to community resources:     Patient/Family's Response to care:  The patient thanked the CSW for visit and update.  Patient/Family's Understanding of and Emotional Response to Diagnosis, Current Treatment, and Prognosis:  The patient is in agreement with return to [facility] when stable.  Emotional Assessment Appearance:    Attitude/Demeanor/Rapport:    Affect (typically observed):    Orientation:    Alcohol / Substance use:    Psych involvement (Current and /or in the community):     Discharge Needs  Concerns to be addressed:    Readmission within the last 30 days:    Current discharge risk:    Barriers to Discharge:      Judi CongKaren M Noelly Lasseigne, LCSW 04/22/2017, 4:08 PM

## 2017-04-22 NOTE — [place] FL2 (Signed)
[place] MEDICAID FL2 LEVEL OF CARE SCREENING TOOL     IDENTIFICATION  Patient Name: Brian FlackJoseph Bezek Birthdate: November 14, 1980 Sex: male Admission Date (Current Location): 04/21/2017  [place]   Facility and Address:  [facility], [address], [place], [place] [address]      Provider Number: [id number]  Attending Physician Name and Address:  Houston SirenSainani, Vivek J, MD  Relative Name and Phone Number:  Melida QuitterLinda Barrera (mother) [phone]    Current Level of Care: Hospital Recommended Level of Care: Skilled Nursing Facility Prior Approval Number:    Date Approved/Denied:   PASRR Number: [id number]  Discharge Plan: SNF    Current Diagnoses: Patient Active Problem List   Diagnosis Date Noted  . 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 Acute on chronic respiratory failure with hypoxia and hypercapnia (HCC)   . Pleural effusion, bilateral   . Postprocedural pneumothorax   . ARF (acute renal failure) (HCC) 03/30/2017    Orientation RESPIRATION BLADDER Height & Weight     Self, Situation, Place  Normal Continent Weight: 245 lb (111.1 kg) Height:  5\' 9"  (175.3 cm)  BEHAVIORAL SYMPTOMS/MOOD NEUROLOGICAL BOWEL NUTRITION STATUS      Continent Diet (Heart healthy)  AMBULATORY STATUS COMMUNICATION OF NEEDS Skin   Extensive Assist Verbally Bruising                       Personal Care Assistance Level of Assistance  Bathing, Feeding, Dressing Bathing Assistance: Maximum assistance Feeding assistance: Independent Dressing Assistance: Maximum assistance     Functional Limitations Info             SPECIAL CARE FACTORS FREQUENCY                       Contractures Contractures Info: Not present    Additional Factors Info  Code Status, Allergies, Psychotropic Code Status Info: Full Allergies Info: NKA Psychotropic Info: Wellbutrin         Current Medications (04/22/2017):  This is the current  hospital active medication list Current Facility-Administered Medications  Medication Dose Route Frequency Provider Last Rate Last Dose  . acetaminophen (TYLENOL) tablet 650 mg  650 mg Oral Q6H PRN Enid BaasKalisetti, Radhika, MD       Or  . acetaminophen (TYLENOL) suppository 650 mg  650 mg Rectal Q6H PRN Enid BaasKalisetti, Radhika, MD      . amLODipine (NORVASC) tablet 5 mg  5 mg Oral Daily Enid BaasKalisetti, Radhika, MD   5 mg at 04/22/17 0930  . apixaban (ELIQUIS) tablet 5 mg  5 mg Oral BID Enid BaasKalisetti, Radhika, MD   5 mg at 04/22/17 0930  . bisoprolol (ZEBETA) tablet 10 mg  10 mg Oral Daily Enid BaasKalisetti, Radhika, MD   10 mg at 04/22/17 0930  . buPROPion [facility](WELLBUTRIN) tablet 100 mg  100 mg Oral BID Enid BaasKalisetti, Radhika, MD   100 mg at 04/22/17 0930  . collagenase (SANTYL) ointment 1 application  1 application Topical Daily Enid BaasKalisetti, Radhika, MD   1 application at 04/22/17 0935  . docusate sodium (COLACE) capsule 100 mg  100 mg Oral BID Enid BaasKalisetti, Radhika, MD   100 mg at 04/21/17 2214  . ferrous sulfate tablet 325 mg  325 mg Oral Q1200 Enid BaasKalisetti, Radhika, MD   325 mg at 04/22/17 1304  . HYDROcodone-acetaminophen (NORCO/VICODIN) 5-325 MG per tablet 1 tablet  1 tablet Oral Q4H PRN Houston SirenSainani, Vivek J, MD   1 tablet  at 04/22/17 1304  . irbesartan (AVAPRO) tablet 300 mg  300 mg Oral QHS Enid Baas, MD   300 mg at 04/21/17 2214  . levothyroxine (SYNTHROID, LEVOTHROID) tablet 75 mcg  75 mcg Oral QAC breakfast Enid Baas, MD   75 mcg at 04/22/17 0930  . multivitamin (RENA-VIT) tablet 1 tablet  1 tablet Oral QHS Enid Baas, MD   1 tablet at 04/21/17 2214  . ondansetron (ZOFRAN) tablet 4 mg  4 mg Oral Q6H PRN Enid Baas, MD       Or  . ondansetron (ZOFRAN) injection 4 mg  4 mg Intravenous Q6H PRN Enid Baas, MD      . sertraline (ZOLOFT) tablet 50 mg  50 mg Oral Daily Enid Baas, MD   50 mg at 04/22/17 0930     Discharge Medications: Please see discharge summary for a list of  discharge medications.  Relevant Imaging Results:  Relevant Lab Results:   Additional Information [id number]  Judi Cong, LCSW

## 2017-04-22 NOTE — Clinical Social Work Note (Signed)
CSW is following this patient as he is a resident of LTC at [facility]. CSW will assess when able due to readmission.  Argentina PonderKaren Martha Delron Comer, MSW, Theresia MajorsLCSWA [phone]

## 2017-04-22 NOTE — Progress Notes (Signed)
[facility]  ROUNDING NOTE   Subjective:   Patient transferred back from [facility] yesterday, 9/8. Was there from 9/3-9/8  Patient was unable to get a renal biopsy due to positioning, body habitus and requiring general anesthesia.   Patient underwent large volume paracentesis where 7 liters removed on 9/5.   Continued dialysis at [facility].   Objective:  Vital signs in last 24 hours:  Temp:  [97.8 F (36.6 C)-98.5 F (36.9 C)] 98.1 F (36.7 C) (09/09 1227) Pulse Rate:  [64-71] 64 (09/09 1227) Resp:  [16-18] 18 (09/09 0607) BP: (106-161)/(68-88) 148/81 (09/09 1227) SpO2:  [99 %-100 %] 100 % (09/09 1227) Weight:  [111.1 kg (245 lb)] 111.1 kg (245 lb) (09/08 1748)  Weight change:  Filed Weights   04/21/17 1748  Weight: 111.1 kg (245 lb)    Intake/Output: I/O last 3 completed shifts: In: -  Out: 100 [Urine:100]   Intake/Output this shift:  No intake/output data recorded.  Physical Exam: General: Chronically ill-appearing, laying in bed  Head: +temporal wasting  Eyes: Anicteric  Neck: supple  Lungs:  Diminished bilaterally, Normal effort   Heart: S1S2 no rubs  Abdomen:  Soft, nontender, obese, pannus    Extremities: 2+ - 3+ dependent edema  Neurologic: significant generalized muscle wasting  Skin: Prurigo lesions over legs and thighs, dressing on thighs  Access: Rt IJ temp cath Dr. Wyn Quaker 8/20    Basic Metabolic Panel:  Recent Labs Lab 04/22/17 0845  NA 139  K 3.2*  CL 101  CO2 28  GLUCOSE 90  BUN 21*  CREATININE 2.74*  CALCIUM 9.0    Liver Function Tests: No results for input(s): AST, ALT, ALKPHOS, BILITOT, PROT, ALBUMIN in the last 168 hours. No results for input(s): LIPASE, AMYLASE in the last 168 hours. No results for input(s): AMMONIA in the last 168 hours.  CBC:  Recent Labs Lab 04/16/17 0333 04/22/17 0845  WBC 8.0 19.5*  HGB 8.7* 9.0*  HCT 27.5* 28.4*  MCV 84.7 83.6  PLT 373 425    Cardiac Enzymes: No results for  input(s): CKTOTAL, CKMB, CKMBINDEX, TROPONINI in the last 168 hours.  BNP: Invalid input(s): POCBNP  CBG: No results for input(s): GLUCAP in the last 168 hours.  Microbiology: Results for orders placed or performed during the hospital encounter of 03/30/17  Aerobic/Anaerobic Culture (surgical/deep wound)     Status: Abnormal   Collection Time: 03/30/17  7:56 PM  Result Value Ref Range Status   Specimen Description WOUND LEFT THIGH  Final   Special Requests NONE  Final   Gram Stain   Final    ABUNDANT WBC PRESENT, PREDOMINANTLY PMN ABUNDANT GRAM POSITIVE COCCI ABUNDANT GRAM NEGATIVE RODS MODERATE GRAM POSITIVE RODS MODERATE GRAM NEGATIVE COCCI    Culture (A)  Final    MULTIPLE ORGANISMS PRESENT, NONE PREDOMINANT NO ANAEROBES ISOLATED Performed at [facility], [address] [place], [place] [address]    Report Status 04/05/2017 FINAL  Final  MRSA PCR Screening     Status: Abnormal   Collection Time: 03/30/17  8:58 PM  Result Value Ref Range Status   MRSA by PCR POSITIVE (A) NEGATIVE Final    Comment:        The GeneXpert MRSA Assay (FDA approved for NASAL specimens only), is one component of a comprehensive MRSA colonization surveillance program. It is not intended to diagnose MRSA infection nor to guide or monitor treatment for MRSA infections. RESULT CALLED TO, READ BACK BY AND VERIFIED WITH:  KARA CAMPBELL AT 2230 03/30/17.PMH   Body fluid culture     Status: None   Collection Time: 04/02/17 11:50 AM  Result Value Ref Range Status   Specimen Description PLEURAL  Final   Special Requests NONE  Final   Gram Stain NO WBC SEEN NO ORGANISMS SEEN   Final   Culture   Final    NO GROWTH 3 DAYS Performed at [facility], [address], [place], [place] [address]    Report Status 04/05/2017 FINAL  Final  Acid Fast Smear (AFB)     Status: None   Collection Time: 04/02/17 11:50 AM  Result Value Ref Range Status   AFB Specimen Processing Concentration   Final   Acid Fast Smear Negative  Final    Comment: (NOTE) Performed At: [facility] [address] [place], [place] [id number] Mila Homer MD VO:[id number]    Source (AFB) PLEURAL  Final    Coagulation Studies: No results for input(s): LABPROT, INR in the last 72 hours.  Urinalysis: No results for input(s): COLORURINE, LABSPEC, PHURINE, GLUCOSEU, HGBUR, BILIRUBINUR, KETONESUR, PROTEINUR, UROBILINOGEN, NITRITE, LEUKOCYTESUR in the last 72 hours.  Invalid input(s): APPERANCEUR    Imaging: Dg Chest 2 View  Result Date: 04/22/2017 CLINICAL DATA:  CKD, CHF, lower extremity edema with ulcerations, pulmonary hypertension, morbid obesity. Gradual worsening of lower extremity weakness. EXAM: CHEST  2 VIEW COMPARISON:  Chest x-ray dated 04/09/2017. FINDINGS: Stable cardiomegaly. Perihilar edema. Bilateral pleural effusions, small to moderate in size, with probable associated atelectasis. No pneumothorax seen. No acute or suspicious osseous finding. IMPRESSION: 1. Cardiomegaly with perihilar edema indicating CHF. 2. Bilateral pleural effusions, small to moderate in size. Suspect associated bibasilar atelectasis. Electronically Signed   By: Bary Richard M.D.   On: 04/22/2017 08:45     Medications:    . amLODipine  5 mg Oral Daily  . apixaban  5 mg Oral BID  . bisoprolol  10 mg Oral Daily  . buPROPion  100 mg Oral BID  . collagenase  1 application Topical Daily  . docusate sodium  100 mg Oral BID  . ferrous sulfate  325 mg Oral Q1200  . irbesartan  300 mg Oral QHS  . levothyroxine  75 mcg Oral QAC breakfast  . multivitamin  1 tablet Oral QHS  . sertraline  50 mg Oral Daily   acetaminophen **OR** acetaminophen, HYDROcodone-acetaminophen, ondansetron **OR** ondansetron (ZOFRAN) IV  Assessment/ Plan:  [name] is a 36 y.o. white male with hypertension, morbid obesity, pulmonary hypertension, hypothyroidism, hyperlipidemia, depression/anxiety, coronary artery disease,  congestive heart failure, who was admitted to [facility] on 03/30/2017  1. Acute renal failure on chronic kidney disease stage III with nephrotic range proteinuria, hematuria and glucosuria No baseline creatinine available. Creatinine of 1.3 in 12/2014 (care everywhere).  Concern for progression of chronic kidney disease to CKD stage V.  Patient has Nephrotic syndrome of unknown etiology. He states he has been dealing with edema for over 2-3 yeas but did not seek medical care. Renal ultrasound report reviewed. HIV negative Not a candidate for kidney biopsy due to body habitus/ inability to lie prone/ current infection First hemodialysis treatment on 8/20.  Plan on hemodialysis tomorrow.   2. Anasarca, with hypertension, proteinuria. Echocardiogram with normal systolic function 8/18. No visualization of hepatic cirrhosis on CT abd/pelvis Status post large volume paracentesis on 9/5 7 liters. Left thoracentesis on 8/27 -  ultrafiltration with hemodialysis. - Currently not on diuretics  3. Secondary hyperparathyroidism.  PTH 60. Not on binders or vitamin D agent. Calcium and phosphorus at goal.   4. Anemia of chronic kidney disease:  - EPO with HD  5. Generalized weakness with muscle wasting. No muscle biopsy performed.  Neurology and rheumatology evaluation performed. - holding statin    LOS: 1 [name] 9/9/201812:49 PM

## 2017-04-22 NOTE — Progress Notes (Signed)
PT Attempt Note  Patient Details Name: [name] MRN: [id number] DOB: 13-Dec-1980   Evaluation Attempt:    Reason Eval/Treat Not Completed: Patient declined, no reason specified. Chart reviewed and RN consulted. Attempted to work with patient. Despite encouragement from therapist pt declines to participate with evaluation. Pt reports that he is currently having diarrhea and cannot participate. PT evaluation will be performed on later date/time as pt is willing to participate. If pt needs to demonstrate tolerance sitting upright in chair for HD needs prior to discharge nursing staff can use Hoyer lift to place pt in recliner.   Sharalyn InkJason D Chrissie Dacquisto PT, DPT   Delania Ferg 04/22/2017, 11:17 AM

## 2017-04-23 MED ORDER — FERROUS SULFATE 325 (65 FE) MG PO TABS
325.0000 mg | ORAL_TABLET | Freq: Every day | ORAL | Status: DC
  Administered 2017-04-23 – 2017-05-02 (×9): 325 mg via ORAL
  Filled 2017-04-23 (×9): qty 1

## 2017-04-23 MED ORDER — RENA-VITE PO TABS
1.0000 | ORAL_TABLET | Freq: Every day | ORAL | Status: DC
  Administered 2017-04-23 – 2017-05-01 (×9): 1 via ORAL
  Filled 2017-04-23 (×9): qty 1

## 2017-04-23 MED ORDER — BUPROPION HCL 100 MG PO TABS
100.0000 mg | ORAL_TABLET | Freq: Two times a day (BID) | ORAL | Status: DC
  Administered 2017-04-23 – 2017-05-02 (×17): 100 mg via ORAL
  Filled 2017-04-23 (×20): qty 1

## 2017-04-23 MED ORDER — LEVOTHYROXINE SODIUM 75 MCG PO TABS
75.0000 ug | ORAL_TABLET | Freq: Every day | ORAL | Status: DC
  Administered 2017-04-23 – 2017-05-02 (×10): 75 ug via ORAL
  Filled 2017-04-23 (×10): qty 1

## 2017-04-23 MED ORDER — AMLODIPINE BESYLATE 5 MG PO TABS
5.0000 mg | ORAL_TABLET | Freq: Every day | ORAL | Status: DC
  Administered 2017-04-23 – 2017-04-26 (×4): 5 mg via ORAL
  Filled 2017-04-23 (×4): qty 1

## 2017-04-23 NOTE — Progress Notes (Signed)
Pre hd info 

## 2017-04-23 NOTE — Progress Notes (Signed)
Pre hd assessment  

## 2017-04-23 NOTE — Consult Note (Signed)
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  Both with devitalized tissue.  Will recommend ongoing enzymatic debridement.  Santyl daily.  Wound type:Muscle wasting, nephrotic syndrome.  Pressure Injury POA: Yes Measurement:Left upper leg:  4 cm x 3 cm large plug devitalized tissue to wound bed.  Right lateral upper thigh:  3 cm x 1 cm x 0.2 cm with eschar to wound bed.  Wound UXL:[id number]:devitalized tissue Drainage (amount, consistency, odor) Minimal serosanguinous Periwound:intact Dressing procedure/placement/frequency:Cleanse wounds with NS.  Apply Santyl to wound bed.  Cover with NS moist gauze.  Cover with ABD pad and tape.  Change daily.  Will not follow at this time.  Please re-consult if needed.  Maple HudsonKaren Javaun Dimperio RN BSN CWON Pager [phone]

## 2017-04-23 NOTE — Progress Notes (Signed)
Hd start, alert, no c/o, stable 

## 2017-04-23 NOTE — Progress Notes (Signed)
Spoke with Dr. Thedore MinsSingh regarding discontinuing temporary trialysis catheter. Okay to remove despite not having a new dialysis access site.

## 2017-04-23 NOTE — Progress Notes (Signed)
Post hd vitals 

## 2017-04-23 NOTE — Progress Notes (Signed)
PT Cancellation Note  Patient Details Name: [name] MRN: [id number] DOB: 1980-10-10   Cancelled Treatment:    Reason Eval/Treat Not Completed: Patient at procedure or test/unavailable (Evaluation re-attempted.  Patient currently leaving unit for dialysis.  Will re-attempt at later time/date as patient available and medically appropriate.)   Windel Keziah H. Manson PasseyBrown, PT, DPT, NCS 04/23/17, 9:46 AM [phone]

## 2017-04-23 NOTE — Progress Notes (Signed)
Post hd assessment 

## 2017-04-23 NOTE — Progress Notes (Signed)
[employer] [facility] [place] [facility]   PATIENT NAME: Brian FlackJoseph [name]    MR#:  [id number]  DATE OF BIRTH:  [birth year]  SUBJECTIVE:   Patient was recently in the hospital and [facility] and now returns back. He was there from September 3 to September 8. Patient noted to be in chronic kidney disease and due to body habitus was unable to get a renal bipsy.  As per rheumatology and neurology they thought his muscle wasting was related to deconditioning without evidence of Myositis. Pt. Seen at HD and tolerating it well.   REVIEW OF SYSTEMS:    Review of Systems  Constitutional: Negative for chills and fever.  HENT: Negative for congestion and tinnitus.   Eyes: Negative for blurred vision and double vision.  Respiratory: Negative for cough, shortness of breath and wheezing.   Cardiovascular: Negative for chest pain, orthopnea and PND.  Gastrointestinal: Negative for abdominal pain, diarrhea, nausea and vomiting.  Genitourinary: Negative for dysuria and hematuria.  Musculoskeletal: Positive for joint pain (left leg).  Neurological: Positive for weakness. Negative for dizziness, sensory change and focal weakness.  All other systems reviewed and are negative.   Nutrition: Renal Tolerating Diet: Yes Tolerating PT: Await Eval.   DRUG ALLERGIES:  No Known Allergies  VITALS:  Blood pressure (!) 151/91, pulse 64, temperature 98 F (36.7 C), temperature source Oral, resp. rate 14, height 5\' 9"  (1.753 m), weight 111.1 kg (245 lb), SpO2 100 %.  PHYSICAL EXAMINATION:   Physical Exam  GENERAL:  36 y.o.-year-old morbidly obese patient lying in bed in no acute distress.  EYES: Pupils equal, round, reactive to light and accommodation. No scleral icterus. Extraocular muscles intact.  HEENT: Head atraumatic, normocephalic. Oropharynx and nasopharynx clear.  NECK:  Supple, no jugular venous distention. No thyroid enlargement, no tenderness.  LUNGS: Normal breath sounds  bilaterally, no wheezing, rales, rhonchi. No use of accessory muscles of respiration.  CARDIOVASCULAR: S1, S2 normal. No murmurs, rubs, or gallops.  ABDOMEN: Soft, nontender, nondistended. Bowel sounds present. No organomegaly or mass.  EXTREMITIES: No cyanosis, clubbing or edema b/l.    NEUROLOGIC: Cranial nerves II through XII are intact. No focal Motor or sensory deficits b/l. Globally weak.    PSYCHIATRIC: The patient is alert and oriented x 3.  SKIN: No obvious rash, lesion, or ulcer.   Right chest wall temp dialysis cath in place.   LABORATORY PANEL:   CBC  Recent Labs Lab 04/22/17 0845  WBC 19.5*  HGB 9.0*  HCT 28.4*  PLT 425   ------------------------------------------------------------------------------------------------------------------  Chemistries   Recent Labs Lab 04/22/17 0845  NA 139  K 3.2*  CL 101  CO2 28  GLUCOSE 90  BUN 21*  CREATININE 2.74*  CALCIUM 9.0   ------------------------------------------------------------------------------------------------------------------  Cardiac Enzymes No results for input(s): TROPONINI in the last 168 hours. ------------------------------------------------------------------------------------------------------------------  RADIOLOGY:  Dg Chest 2 View  Result Date: 04/22/2017 CLINICAL DATA:  CKD, CHF, lower extremity edema with ulcerations, pulmonary hypertension, morbid obesity. Gradual worsening of lower extremity weakness. EXAM: CHEST  2 VIEW COMPARISON:  Chest x-ray dated 04/09/2017. FINDINGS: Stable cardiomegaly. Perihilar edema. Bilateral pleural effusions, small to moderate in size, with probable associated atelectasis. No pneumothorax seen. No acute or suspicious osseous finding. IMPRESSION: 1. Cardiomegaly with perihilar edema indicating CHF. 2. Bilateral pleural effusions, small to moderate in size. Suspect associated bibasilar atelectasis. Electronically Signed   By: Bary RichardStan  Maynard M.D.   On: 04/22/2017 08:45      ASSESSMENT AND PLAN:  [name]  is a 36 y.o. male with a known history of CK D with nephrotic range proteinuria, diastolic CHF, chronic lower extremity edema with ulcerations, pulmonary hypertension, generalized anxiety disorder and depression, hypertension who was recently admitted to [facility] from 03/30/2017 and was transferred to [facility] on 04/16/2017 for rheumatology evaluation and consideration of a muscle biopsy is being sent back for further management.  #1 acute renal failure on chronic kidney disease stage 3-appreciate nephrology consult and pt. Has nephrotic range proteinuria but etiology unclear presently.  -Patient remains on Tuesday, Thursday and Saturday hemodialysis. Renal ultrasound showing no hydronephrosis but worsening renal atrophy. - Kidney biopsy was attempted at [facility], however unable to be done due to his body habitus and positioning. - needs outpatient spot for HD but needs to deemed ESRD.  As per Nephro will d/c temp. Dialysis cath and place perm-cath and plan for HD long term.   #2 acute hypoxic respiratory failure-secondary to diastolic CHF and pleural effusion. - was on Bipap and now weaned and cont. [place] O2 for now.  - X-ray from yesterday showing some mild CHF.  Patient did get thoracentesis with 2 L removed prior to transfer to [facility] and no acute need for thoracentesis now.   #3 anasarca- s/p thoracentesis and also Paracentesis.  - cont. Dialysis for fluid removal and watch volume status. Currently not on diuretics.   #4 significant muscle weakness- seen by Neurology and rheumatology at [facility]. They did not consider muscle biopsy as they believe that the weakness is more from deconditioning and malnutrition rather than any inflammatory myositis. CK's, Aldolase have been normal.   #5 hypertension-continue bisoprolol, irbesartan and Norvasc.  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 Was on heparin gtt at [facility] as pt. Was get  biopsies, but now on Eliquis.   #7 acute on chronic anemia-received transfusion prior to transfer to [facility]. Hg. Stable and no need for transfusion. - cont. Epogen with dialysis and IV iron if needed with dialysis.    All the records are reviewed and case discussed with Care Management/Social Worker. Management plans discussed with the patient, family and they are in agreement.  CODE STATUS: Full code  DVT Prophylaxis: Eliquis  TOTAL TIME TAKING CARE OF THIS PATIENT: 30 minutes.   POSSIBLE D/C unclear, DEPENDING ON CLINICAL CONDITION.   Houston Siren M.D on 04/23/2017 at 3:07 PM  Between 7am to 6pm - Pager - [phone]  After 6pm go to [email or web address] - Social research officer, government  [facility]  Office  [phone]  CC: Primary care physician; Patient, No Pcp Per

## 2017-04-23 NOTE — Progress Notes (Signed)
[facility]  ROUNDING NOTE   Subjective:   Patient transferred back from [facility] on 9/8. Was there from 9/3-9/8  Patient was unable to get a renal biopsy due to positioning, body habitus and requiring general anesthesia.  Patient underwent large volume paracentesis where 7 liters removed on 9/5.    HEMODIALYSIS FLOWSHEET:  Blood Flow Rate (mL/min): 300 mL/min Arterial Pressure (mmHg): -120 mmHg Venous Pressure (mmHg): 190 mmHg Transmembrane Pressure (mmHg): 70 mmHg Ultrafiltration Rate (mL/min): 1000 mL/min Dialysate Flow Rate (mL/min): 600 ml/min Conductivity: Machine : 13.9 Conductivity: Machine : 13.9 Dialysis Fluid Bolus: Normal Saline Bolus Amount (mL): 250 mL (prime) Dialysate Change:  (3k)  denies acute c/o   Objective:  Vital signs in last 24 hours:  Temp:  [97.5 F (36.4 C)-98.1 F (36.7 C)] 98 F (36.7 C) (09/10 1000) Pulse Rate:  [54-60] 57 (09/10 1335) Resp:  [11-17] 12 (09/10 1335) BP: (134-151)/(71-91) 134/84 (09/10 1335) SpO2:  [100 %] 100 % (09/10 1335)  Weight change:  Filed Weights   04/21/17 1748  Weight: 111.1 kg (245 lb)    Intake/Output: I/O last 3 completed shifts: In: 0  Out: 320 [Urine:320]   Intake/Output this shift:  Total I/O In: 300 [P.O.:300] Out: -   Physical Exam: General: Chronically ill-appearing, laying in bed  Head: +temporal wasting  Eyes: Anicteric  Neck: supple  Lungs:  Diminished bilaterally, Normal effort   Heart: S1S2 no rubs  Abdomen:  Soft, nontender, obese, pannus    Extremities: + dependent edema  Neurologic: significant generalized muscle wasting  Skin: Prurigo lesions over legs and thighs, dressing on thighs  Access: Rt IJ temp cath Dr. Wyn Quaker 8/20    Basic Metabolic Panel:  Recent Labs Lab 04/22/17 0845  NA 139  K 3.2*  CL 101  CO2 28  GLUCOSE 90  BUN 21*  CREATININE 2.74*  CALCIUM 9.0    Liver Function Tests: No results for input(s): AST, ALT, ALKPHOS, BILITOT, PROT, ALBUMIN in  the last 168 hours. No results for input(s): LIPASE, AMYLASE in the last 168 hours. No results for input(s): AMMONIA in the last 168 hours.  CBC:  Recent Labs Lab 04/22/17 0845  WBC 19.5*  HGB 9.0*  HCT 28.4*  MCV 83.6  PLT 425    Cardiac Enzymes: No results for input(s): CKTOTAL, CKMB, CKMBINDEX, TROPONINI in the last 168 hours.  BNP: Invalid input(s): POCBNP  CBG: No results for input(s): GLUCAP in the last 168 hours.  Microbiology: Results for orders placed or performed during the hospital encounter of 03/30/17  Aerobic/Anaerobic Culture (surgical/deep wound)     Status: Abnormal   Collection Time: 03/30/17  7:56 PM  Result Value Ref Range Status   Specimen Description WOUND LEFT THIGH  Final   Special Requests NONE  Final   Gram Stain   Final    ABUNDANT WBC PRESENT, PREDOMINANTLY PMN ABUNDANT GRAM POSITIVE COCCI ABUNDANT GRAM NEGATIVE RODS MODERATE GRAM POSITIVE RODS MODERATE GRAM NEGATIVE COCCI    Culture (A)  Final    MULTIPLE ORGANISMS PRESENT, NONE PREDOMINANT NO ANAEROBES ISOLATED Performed at [facility], [address], [place], [place] [address]    Report Status 04/05/2017 FINAL  Final  MRSA PCR Screening     Status: Abnormal   Collection Time: 03/30/17  8:58 PM  Result Value Ref Range Status   MRSA by PCR POSITIVE (A) NEGATIVE Final    Comment:        The GeneXpert MRSA Assay (FDA approved for  NASAL specimens only), is one component of a comprehensive MRSA colonization surveillance program. It is not intended to diagnose MRSA infection nor to guide or monitor treatment for MRSA infections. RESULT CALLED TO, READ BACK BY AND VERIFIED WITH: KARA CAMPBELL AT 2230 03/30/17.PMH   Body fluid culture     Status: None   Collection Time: 04/02/17 11:50 AM  Result Value Ref Range Status   Specimen Description PLEURAL  Final   Special Requests NONE  Final   Gram Stain NO WBC SEEN NO ORGANISMS SEEN   Final   Culture   Final    NO GROWTH 3  DAYS Performed at [facility], [address] [place], [place] [address]    Report Status 04/05/2017 FINAL  Final  Acid Fast Smear (AFB)     Status: None   Collection Time: 04/02/17 11:50 AM  Result Value Ref Range Status   AFB Specimen Processing Concentration  Final   Acid Fast Smear Negative  Final    Comment: (NOTE) Performed At: [facility] [address] [place], [place] [id number] Mila Homer MD XB:[id number]    Source (AFB) PLEURAL  Final    Coagulation Studies: No results for input(s): LABPROT, INR in the last 72 hours.  Urinalysis: No results for input(s): COLORURINE, LABSPEC, PHURINE, GLUCOSEU, HGBUR, BILIRUBINUR, KETONESUR, PROTEINUR, UROBILINOGEN, NITRITE, LEUKOCYTESUR in the last 72 hours.  Invalid input(s): APPERANCEUR    Imaging: Dg Chest 2 View  Result Date: 04/22/2017 CLINICAL DATA:  CKD, CHF, lower extremity edema with ulcerations, pulmonary hypertension, morbid obesity. Gradual worsening of lower extremity weakness. EXAM: CHEST  2 VIEW COMPARISON:  Chest x-ray dated 04/09/2017. FINDINGS: Stable cardiomegaly. Perihilar edema. Bilateral pleural effusions, small to moderate in size, with probable associated atelectasis. No pneumothorax seen. No acute or suspicious osseous finding. IMPRESSION: 1. Cardiomegaly with perihilar edema indicating CHF. 2. Bilateral pleural effusions, small to moderate in size. Suspect associated bibasilar atelectasis. Electronically Signed   By: Bary Richard M.D.   On: 04/22/2017 08:45     Medications:    . amLODipine  5 mg Oral Daily  . apixaban  5 mg Oral BID  . bisoprolol  10 mg Oral Daily  . buPROPion  100 mg Oral BID  . collagenase  1 application Topical Daily  . docusate sodium  100 mg Oral BID  . epoetin (EPOGEN/PROCRIT) injection  10,000 Units Intravenous Q M,W,F-HD  . ferrous sulfate  325 mg Oral Q1200  . irbesartan  300 mg Oral QHS  . levothyroxine  75 mcg Oral QAC breakfast  . multivitamin  1  tablet Oral QHS  . sertraline  50 mg Oral Daily   acetaminophen **OR** acetaminophen, HYDROcodone-acetaminophen, ondansetron **OR** ondansetron (ZOFRAN) IV  Assessment/ Plan:  [name] is a 36 y.o. white male with hypertension, morbid obesity, pulmonary hypertension, hypothyroidism, hyperlipidemia, depression/anxiety, coronary artery disease, congestive heart failure, who was admitted to [facility] on 03/30/2017  1. Acute renal failure on chronic kidney disease stage III with nephrotic range proteinuria, hematuria and glucosuria No baseline creatinine available. Creatinine of 1.3 in 12/2014 (care everywhere).   Patient remains dialysis dependent.   However, S Creatinine is low at 2.74; with poor muscle mass GFR is inaccurate Will order 24 hr Creatinine collection D/c temp dialysis cath. If he needs further HD, will obtain permcath on wednesday First hemodialysis treatment on 8/20.  Patient seen during dialysis Tolerating well   2. Anasarca, with hypertension, proteinuria. Echocardiogram with normal systolic function 8/18. No visualization  of hepatic cirrhosis on CT abd/pelvis Status post large volume paracentesis on 9/5 7 liters. Left thoracentesis on 8/27 -  ultrafiltration with hemodialysis. - Currently not on diuretics  3. Secondary hyperparathyroidism.  PTH 60. Not on binders or vitamin D agent. Calcium and phosphorus at goal.   4. Anemia of chronic kidney disease:  - EPO with HD  5. Generalized weakness with muscle wasting. No muscle biopsy performed.  Neurology and rheumatology evaluation performed. - holding statin    LOS: 2 [name] 9/10/20181:45 PM

## 2017-04-23 NOTE — Progress Notes (Signed)
  End of hd 

## 2017-04-24 LAB — RENAL FUNCTION PANEL
ALBUMIN: 2 g/dL — AB (ref 3.5–5.0)
Anion gap: 7 (ref 5–15)
BUN: 23 mg/dL — AB (ref 6–20)
CALCIUM: 8.4 mg/dL — AB (ref 8.9–10.3)
CO2: 29 mmol/L (ref 22–32)
Chloride: 103 mmol/L (ref 101–111)
Creatinine, Ser: 2.93 mg/dL — ABNORMAL HIGH (ref 0.61–1.24)
GFR calc Af Amer: 31 mL/min — ABNORMAL LOW (ref >60)
GFR calc non Af Amer: 26 mL/min — ABNORMAL LOW (ref >60)
GLUCOSE: 85 mg/dL (ref 65–99)
PHOSPHORUS: 4.2 mg/dL (ref 2.5–4.6)
Potassium: 3.4 mmol/L — ABNORMAL LOW (ref 3.5–5.1)
SODIUM: 139 mmol/L (ref 135–145)

## 2017-04-24 LAB — CBC
HCT: 29.2 % — ABNORMAL LOW (ref 40.0–52.0)
Hemoglobin: 9.3 g/dL — ABNORMAL LOW (ref 13.0–18.0)
MCH: 26.7 pg (ref 26.0–34.0)
MCHC: 31.8 g/dL — AB (ref 32.0–36.0)
MCV: 83.7 fL (ref 80.0–100.0)
PLATELETS: 403 10*3/uL (ref 150–440)
RBC: 3.49 MIL/uL — ABNORMAL LOW (ref 4.40–5.90)
RDW: 17.1 % — ABNORMAL HIGH (ref 11.5–14.5)
WBC: 14.8 10*3/uL — AB (ref 3.8–10.6)

## 2017-04-24 LAB — CREATININE CLEARANCE, URINE, 24 HOUR
COLLECTION INTERVAL-CRCL: 24 h
CREATININE, URINE: 39 mg/dL
Creatinine Clearance: 5 mL/min — ABNORMAL LOW (ref 75–125)
Creatinine, 24H Ur: 215 mg/d — ABNORMAL LOW (ref 800–2000)
URINE TOTAL VOLUME-CRCL: 550 mL

## 2017-04-24 NOTE — Evaluation (Signed)
Physical Therapy Evaluation Patient Details Name: Brian FlackJoseph Punches MRN: [id number] DOB: Jan 20, 1981 Today's Date: 04/24/2017   History of Present Illness  Pt is a 36 y.o. M who presented to the ED from [facility] with SOB, renal failure, multiple skin wounds, and swelling in B LE. Pt admitted 03/30/2017 with acute on chronic renal failure, acute on chronic respiratory failure with hypoxia, and B pulmonary effusion. Pt s/p R IJ temporary HD catheter placement, s/p debridement of a L thigh wound, and s/p L thoracentesis. Hospital stay complicated by transfer to CCU (now returned to floor) for hypercarbic respiratory failure requiring BiPAP; additionally significant for L thoracentesis (8/27) with removal of 2L and paracentesis (8/29) with removal of 5L.  Currently on 4L supplemental O2 via [place].   Recently transferred to outside hospital for consideration of muscle biopsy; unable to complete biopsy, but did undergo paracentesis (5+7L removed) and L thoracentesis.  [facility].  PMH: occassional tremors, B lymphedema, obesity, CKD, R cardiac cath (2016), HLD, HTN, major depression, diastolic CHF, primary pulmonary HTN, hypothyroidism, and new dialysis patient.  Clinical Impression  Upon evaluation, patient alert and oriented; follows all commands.  Declined OOB attempts this date, but did demonstrate good effort with all bed-level therex.  Fatigues quickly and requires frequent rest periods, but has maintained good ROM throughout all extremities.  Significant muscle wasting/atrophy evident throughout all extremities.  Currently requiring mod/max assist +1-2 for rolling; declined unsupported sitting or OOB.  Will plan to assess/progress as able in subsequent sessions. Did review importance of OOB to chair, accommodation to upright positioning.  Patient voiced understanding and agreeable to OOB with nursing this PM.  RN informed/aware; lift and sling available in patient room. Would  benefit from skilled PT to address above deficits and promote optimal return to PLOF; recommend transition to STR upon discharge from acute hospitalization.     Follow Up Recommendations SNF    Equipment Recommendations  None recommended by PT    Recommendations for Other Services       Precautions / Restrictions Precautions Precautions: Fall;Other (comment) Precaution Comments: R IJ temporary HD catheter removed (9/10) Restrictions Weight Bearing Restrictions: No      Mobility  Bed Mobility Overal bed mobility: Needs Assistance Bed Mobility: Rolling Rolling: Mod assist;Max assist            Transfers                 General transfer comment: refused OOB activities  Ambulation/Gait             General Gait Details: refused OOB activities  Stairs            Wheelchair Mobility    Modified Rankin (Stroke Patients Only)       Balance                                             Pertinent Vitals/Pain Pain Assessment: Faces Faces Pain Scale: Hurts even more Pain Location: L thigh (Wound) Pain Descriptors / Indicators: Discomfort;Pressure;Sore;Tender Pain Intervention(s): Limited activity within patient's tolerance;Monitored during session;Repositioned    Home Living Family/patient expects to be discharged to:: Skilled nursing facility                 Additional Comments: plans to return to [facility]    Prior Function Level of Independence: Needs assistance  Comments: At true baseline, indep with ADLs, household and community mobility.  Progressive weakness for past 1.5 years, eventually unable to tranfsers self or ambulate.  Transferred to STR at [facility] in April, 2018 and was participating with therapy (40-50' with RW, min assist), but has been bed-bound for most recent 2 months due to L knee pain.     Hand Dominance   Dominant Hand: Right    Extremity/Trunk Assessment   Upper Extremity  Assessment Upper Extremity Assessment: Generalized weakness (grossly 4-/5 throughout)    Lower Extremity Assessment Lower Extremity Assessment: Generalized weakness (grossly 3-/5 throughout, denies sensory deficit.  Significant muscle wasting/atrophy present.)       Communication   Communication: No difficulties  Cognition Arousal/Alertness: Awake/alert Behavior During Therapy: WFL for tasks assessed/performed Overall Cognitive Status: Within Functional Limits for tasks assessed                                        General Comments      Exercises Other Exercises Other Exercises: Supine UE/LE therex, 1x10, AROM: pull-ups on trapeze bar, resisted D1/D2 flex/ext, resisted shoulder extension with upper trunk flexion (partial sit up); bridging, partial rolling, resisted hip/knee flex and extension.   Assessment/Plan    PT Assessment Patient needs continued PT services  PT Problem List Decreased strength;Decreased range of motion;Decreased activity tolerance;Decreased mobility;Decreased balance;Cardiopulmonary status limiting activity;Obesity;Pain;Decreased skin integrity       PT Treatment Interventions DME instruction;Functional mobility training;Therapeutic activities;Therapeutic exercise;Balance training;Patient/family education    PT Goals (Current goals can be found in the Care Plan section)  Acute Rehab PT Goals Patient Stated Goal: to get stronger PT Goal Formulation: With patient Time For Goal Achievement: 05/08/17 Potential to Achieve Goals: Fair Additional Goals Additional Goal #1: Patient will tolerate OOB to chair x3 hours to demonstrate tolerance for outpatient dialysis.    Frequency Min 2X/week   Barriers to discharge        Co-evaluation               AM-PAC PT "6 Clicks" Daily Activity  Outcome Measure Difficulty turning over in bed (including adjusting bedclothes, sheets and blankets)?: Unable Difficulty moving from lying on  back to sitting on the side of the bed? : Unable Difficulty sitting down on and standing up from a chair with arms (e.g., wheelchair, bedside commode, etc,.)?: Unable Help needed moving to and from a bed to chair (including a wheelchair)?: Total Help needed walking in hospital room?: Total Help needed climbing 3-5 steps with a railing? : Total 6 Click Score: 6    End of Session Equipment Utilized During Treatment: Oxygen Activity Tolerance: Patient tolerated treatment well Patient left: in bed;with call bell/phone within reach;with bed alarm set Nurse Communication: Mobility status PT Visit Diagnosis: Muscle weakness (generalized) (M62.81);Difficulty in walking, not elsewhere classified (R26.2);Pain    Time: 1610-9604 PT Time Calculation (min) (ACUTE ONLY): 23 min   Charges:   PT Evaluation $PT Eval Moderate Complexity: 1 Mod PT Treatments $Therapeutic Exercise: 8-22 mins   PT G Codes:   PT G-Codes **NOT FOR INPATIENT CLASS** Functional Assessment Tool Used: AM-PAC 6 Clicks Basic Mobility Functional Limitation: Mobility: Walking and moving around Mobility: Walking and Moving Around Current Status (V4098): At least 80 percent but less than 100 percent impaired, limited or restricted Mobility: Walking and Moving Around Goal Status [phone]): At least 20 percent but less than 40 percent impaired,  limited or restricted    Yashira Offenberger H. Manson Passey, PT, DPT, NCS 04/24/17, 3:25 PM [phone]

## 2017-04-24 NOTE — Progress Notes (Signed)
[facility] - [place] at [facility]   PATIENT NAME: [name]    MR#:  [id number]  DATE OF BIRTH:  May 11, 1981  SUBJECTIVE:   No acute events overnight. Temporary dialysis catheter removed yesterday. Nephrology is obtaining 24-hour creatinine clearance.patient complaining of pain in his left lower extremity ulcer.  REVIEW OF SYSTEMS:    Review of Systems  Constitutional: Negative for chills and fever.  HENT: Negative for congestion and tinnitus.   Eyes: Negative for blurred vision and double vision.  Respiratory: Negative for cough, shortness of breath and wheezing.   Cardiovascular: Negative for chest pain, orthopnea and PND.  Gastrointestinal: Negative for abdominal pain, diarrhea, nausea and vomiting.  Genitourinary: Negative for dysuria and hematuria.  Musculoskeletal: Positive for joint pain (left leg).  Neurological: Positive for weakness. Negative for dizziness, sensory change and focal weakness.  All other systems reviewed and are negative.   Nutrition: Renal Tolerating Diet: Yes Tolerating PT: Await Eval.   DRUG ALLERGIES:  No Known Allergies  VITALS:  Blood pressure (!) 155/84, pulse 63, temperature 97.9 F (36.6 C), temperature source Oral, resp. rate 19, height 5\' 9"  (1.753 m), weight 111.1 kg (245 lb), SpO2 99 %.  PHYSICAL EXAMINATION:   Physical Exam  GENERAL:  36 y.o.-year-old morbidly obese patient lying in bed in no acute distress.  EYES: Pupils equal, round, reactive to light and accommodation. No scleral icterus. Extraocular muscles intact.  HEENT: Head atraumatic, normocephalic. Oropharynx and nasopharynx clear.  NECK:  Supple, no jugular venous distention. No thyroid enlargement, no tenderness.  LUNGS: Normal breath sounds bilaterally, no wheezing, rales, rhonchi. No use of accessory muscles of respiration.  CARDIOVASCULAR: S1, S2 normal. No murmurs, rubs, or gallops.  ABDOMEN: Soft, nontender, nondistended. Bowel sounds present. No  organomegaly or mass.  EXTREMITIES: No cyanosis, clubbing or edema b/l.    NEUROLOGIC: Cranial nerves II through XII are intact. No focal Motor or sensory deficits b/l. Globally weak.    PSYCHIATRIC: The patient is alert and oriented x 3.  SKIN: No obvious rash, lesion, left lower extremity pressure ulcer on the left upper thigh.   Right chest wall temp dialysis cath in place.   LABORATORY PANEL:   CBC  Recent Labs Lab 04/24/17 0405  WBC 14.8*  HGB 9.3*  HCT 29.2*  PLT 403   ------------------------------------------------------------------------------------------------------------------  Chemistries   Recent Labs Lab 04/24/17 0405  NA 139  K 3.4*  CL 103  CO2 29  GLUCOSE 85  BUN 23*  CREATININE 2.93*  CALCIUM 8.4*   ------------------------------------------------------------------------------------------------------------------  Cardiac Enzymes No results for input(s): TROPONINI in the last 168 hours. ------------------------------------------------------------------------------------------------------------------  RADIOLOGY:  No results found.   ASSESSMENT AND PLAN:   [name]  is a 36 y.o. male with a known history of CK D with nephrotic range proteinuria, diastolic CHF, chronic lower extremity edema with ulcerations, pulmonary hypertension, generalized anxiety disorder and depression, hypertension who was recently admitted to [facility] from 03/30/2017 and was [facility] to [facility] 04[facility]/2017 for rheumatology evaluation and consideration of a muscle biopsy is being sent back for further management.  #1 acute renal failure on chronic kidney disease stage 3-appreciate nephrology consult and pt. Has nephrotic range proteinuria but etiology remains unclear. -Patient remains on Tuesday, Thursday and Saturday hemodialysis. Renal ultrasound showing no hydronephrosis but worsening renal atrophy. - Kid[facility], however unable to be  done due to his body habitus and positioning. - needs outpatient spot for HD but needs to deemed  ESRD.   - Nephro collecting 24 hour urine for Cr. Clearance.  Temp dialysis cath removed and will get [facility] on Wednesday if needed.   #2 acute hypoxic respiratory failure-secondary to diastolic CHF and pleural effusion. - was on Bipap and now weaned and cont. Rosine O2 for now.  - s/p previous thoracentesis but presently no need.  Stable.   #3 anasarca- s/p thoracentesis and also Paracentesis.  - cont. Dialysis for fluid removal and watch volume status. Currently not on diuretics.   #4 significant muscle weakness- seen by Neurology and rheumatology at [facility]. They did not consider muscle biopsy as they believe that the weakness is more from deconditioning and malnutrition rather than any inflammatory myositis. CK's, Aldolase have been normal.   #5 hypertension-continue bisoprolol, irbesartan and Norvasc. - BP stable.   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 Was on heparin gtt at [facility] as pt. Was get biopsies, but now on Eliquis.   #7 acute on chronic anemia-received transfusion prior to transfer to [facility]. Hg. Stable and no need for transfusion. - cont. Epogen with dialysis and IV iron if needed with dialysis.  #8 LLE pressure ulcer - on left upper thigh. Cont. Local wound care as per wound team.     All the records are reviewed and case discussed with Care Management/Social Worker. Management plans discussed with the patient, family and they are in agreement.  CODE STATUS: Full code  DVT Prophylaxis: Eliquis  TOTAL TIME TAKING CARE OF THIS PATIENT: 30 minutes.   POSSIBLE D/C unclear, DEPENDING ON CLINICAL CONDITION.   [place] Siren M.D on 04/24/2017 at 2:47 PM  Between 7am to 6pm - Pager - [phone]  After 6pm go to [email or web address] - Social research officer, government  [facility] [place] [facility]  Office  [phone]  CC: Primary care physician;  Patient, No Pcp Per

## 2017-04-24 NOTE — Progress Notes (Signed)
[facility]  ROUNDING NOTE   Subjective:   Pt reports he is feeling well, tolerating clear liquids, denies shortness of breath. Dialysis catheter removed yesterday.  24 hour urine collection in progress.   Objective:  Vital signs in last 24 hours:  Temp:  [97.6 F (36.4 C)-98.3 F (36.8 C)] 97.6 F (36.4 C) (09/11 0544) Pulse Rate:  [54-65] 60 (09/11 0544) Resp:  [11-18] 18 (09/11 0544) BP: (134-162)/(71-91) 162/88 (09/11 1029) SpO2:  [100 %] 100 % (09/11 0544)  Weight change:  Filed Weights   04/21/17 1748  Weight: 111.1 kg (245 lb)    Intake/Output: I/O last 3 completed shifts: In: 420 [P.O.:420] Out: 3945 [Urine:445; Other:3500]   Intake/Output this shift:  Total I/O In: -  Out: 75 [Urine:75]  Physical Exam: General: Chronically ill-appearing, laying in bed  Head: +temporal wasting  Eyes: Anicteric  Neck: No venous distention  Lungs:  Clear to auscultation bilaterally, Normal effort   Heart: S1S2 no rubs  Abdomen:  Soft, nontender, obese, pannus    Extremities: improved dependent edema  Neurologic: significant generalized muscle wasting  Skin: Prurigo lesions  Access:     Basic Metabolic Panel:  Recent Labs Lab 04/22/17 0845 04/24/17 0405  NA 139 139  K 3.2* 3.4*  CL 101 103  CO2 28 29  GLUCOSE 90 85  BUN 21* 23*  CREATININE 2.74* 2.93*  CALCIUM 9.0 8.4*  PHOS  --  4.2    Liver Function Tests:  Recent Labs Lab 04/24/17 0405  ALBUMIN 2.0*   No results for input(s): LIPASE, AMYLASE in the last 168 hours. No results for input(s): AMMONIA in the last 168 hours.  CBC:  Recent Labs Lab 04/22/17 0845 04/24/17 0405  WBC 19.5* 14.8*  HGB 9.0* 9.3*  HCT 28.4* 29.2*  MCV 83.6 83.7  PLT 425 403    Cardiac Enzymes: No results for input(s): CKTOTAL, CKMB, CKMBINDEX, TROPONINI in the last 168 hours.  BNP: Invalid input(s): POCBNP  CBG: No results for input(s): GLUCAP in the last 168 hours.  Microbiology: Results for  orders placed or performed during the hospital encounter of 03/30/17  Aerobic/Anaerobic Culture (surgical/deep wound)     Status: Abnormal   Collection Time: 03/30/17  7:56 PM  Result Value Ref Range Status   Specimen Description WOUND LEFT THIGH  Final   Special Requests NONE  Final   Gram Stain   Final    ABUNDANT WBC PRESENT, PREDOMINANTLY PMN ABUNDANT GRAM POSITIVE COCCI ABUNDANT GRAM NEGATIVE RODS MODERATE GRAM POSITIVE RODS MODERATE GRAM NEGATIVE COCCI    Culture (A)  Final    MULTIPLE ORGANISMS PRESENT, NONE PREDOMINANT NO ANAEROBES ISOLATED Performed at [facility], [address], [place], [place] [address]    Report Status 04/05/2017 FINAL  Final  MRSA PCR Screening     Status: Abnormal   Collection Time: 03/30/17  8:58 PM  Result Value Ref Range Status   MRSA by PCR POSITIVE (A) NEGATIVE Final    Comment:        The GeneXpert MRSA Assay (FDA approved for NASAL specimens only), is one component of a comprehensive MRSA colonization surveillance program. It is not intended to diagnose MRSA infection nor to guide or monitor treatment for MRSA infections. RESULT CALLED TO, READ BACK BY AND VERIFIED WITH: KARA CAMPBELL AT 2230 03/30/17.PMH   Body fluid culture     Status: None   Collection Time: 04/02/17 11:50 AM  Result Value Ref Range Status   Specimen  Description PLEURAL  Final   Special Requests NONE  Final   Gram Stain NO WBC SEEN NO ORGANISMS SEEN   Final   Culture   Final    NO GROWTH 3 DAYS Performed at [facility], [address] [place], [place] [address]    Report Status 04/05/2017 FINAL  Final  Acid Fast Smear (AFB)     Status: None   Collection Time: 04/02/17 11:50 AM  Result Value Ref Range Status   AFB Specimen Processing Concentration  Final   Acid Fast Smear Negative  Final    Comment: (NOTE) Performed At: [facility] [address] [place], [place] [id number] Mila HomerHancock William F MD XB:[id number]:[phone]    Source (AFB)  PLEURAL  Final    Coagulation Studies: No results for input(s): LABPROT, INR in the last 72 hours.  Urinalysis: No results for input(s): COLORURINE, LABSPEC, PHURINE, GLUCOSEU, HGBUR, BILIRUBINUR, KETONESUR, PROTEINUR, UROBILINOGEN, NITRITE, LEUKOCYTESUR in the last 72 hours.  Invalid input(s): APPERANCEUR    Imaging: No results found.   Medications:    . amLODipine  5 mg Oral Daily  . apixaban  5 mg Oral BID  . bisoprolol  10 mg Oral Daily  . buPROPion  100 mg Oral BID  . collagenase  1 application Topical Daily  . docusate sodium  100 mg Oral BID  . epoetin (EPOGEN/PROCRIT) injection  10,000 Units Intravenous Q M,W,F-HD  . ferrous sulfate  325 mg Oral Q1200  . irbesartan  300 mg Oral QHS  . levothyroxine  75 mcg Oral QAC breakfast  . multivitamin  1 tablet Oral QHS  . sertraline  50 mg Oral Daily   acetaminophen **OR** acetaminophen, HYDROcodone-acetaminophen, ondansetron **OR** ondansetron (ZOFRAN) IV  Assessment/ Plan:  [name] is a 36 y.o. white male with hypertension, morbid obesity, pulmonary hypertension, hypothyroidism, hyperlipidemia, depression/anxiety, coronary artery disease, congestive heart failure, who was [facility]  1. Acute renal failure on chronic kidney disease stage III with nephrotic range proteinuria, hematuria and glucosuria No baseline creatinine available. Creatinine of 1.3 in 12/2014 (care everywhere).   Patient remains dialysis dependent.   However, S Creatinine is low at 2.74; with poor muscle mass GFR is inaccurate  24 hr Creatinine collection in progress Dialysis catheter removed. If he needs further HD, will obtain permcath on wednesday First hemodialysis treatment on 8/20.  BMP ordered for tomorrow morning  2. Anasarca, with hypertension, proteinuria. Echocardiogram with normal systolic function 8/18. No visualization of hepatic cirrhosis on CT abd/pelvis Status post large volume paracentesis on 9/5 7 liters.  Left thoracentesis on 8/27 - Currently not on diuretics  3. Anemia of chronic kidney disease:  - trend is improving.    4. Generalized weakness with muscle wasting. No muscle biopsy performed.  Neurology and rheumatology evaluation performed. - holding statin    LOS: 3 Lynde Ludwig 9/11/201810:55 AM

## 2017-04-24 NOTE — Plan of Care (Signed)
Problem: Physical Regulation: Goal: Ability to maintain clinical measurements within normal limits will improve Outcome: Progressing Pt was able to sit in chair for one hour before he insisted for nursing staff to help him back to bed.

## 2017-04-24 NOTE — Progress Notes (Signed)
RN and NT used hoyer lift to get pt in the chair. Pt tolerated lift very well.  Pt is currently sitting in chair at this time, pt states that "his goal is to sit in the chair for at least two hours." RN will continue to encourage sitting in chair.   [name]

## 2017-04-24 NOTE — Progress Notes (Signed)
24 hour collection of urine was completed and sent down to lab at 1710 04/24/2017.   Zosia Lucchese [place]

## 2017-04-25 ENCOUNTER — Encounter
Admission: AD | Disposition: A | Discharge status: Home / Self Care | Payer: Self-pay | Source: Ambulatory Visit | Attending: Specialist

## 2017-04-25 ENCOUNTER — Inpatient Hospital Stay
Admission: AD | Disposition: A | Discharge status: Home / Self Care | Payer: Self-pay | Source: Ambulatory Visit | Attending: Specialist

## 2017-04-25 DIAGNOSIS — N186 End stage renal disease: Secondary | ICD-10-CM

## 2017-04-25 HISTORY — PX: DIALYSIS/PERMA CATHETER INSERTION: CATH118288

## 2017-04-25 LAB — BASIC METABOLIC PANEL
Anion gap: 6 (ref 5–15)
BUN: 29 mg/dL — AB (ref 6–20)
CHLORIDE: 101 mmol/L (ref 101–111)
CO2: 29 mmol/L (ref 22–32)
CREATININE: 3.5 mg/dL — AB (ref 0.61–1.24)
Calcium: 8.6 mg/dL — ABNORMAL LOW (ref 8.9–10.3)
GFR calc Af Amer: 25 mL/min — ABNORMAL LOW (ref >60)
GFR calc non Af Amer: 21 mL/min — ABNORMAL LOW (ref >60)
GLUCOSE: 83 mg/dL (ref 65–99)
POTASSIUM: 3.4 mmol/L — AB (ref 3.5–5.1)
Sodium: 136 mmol/L (ref 135–145)

## 2017-04-25 LAB — GLUCOSE, CAPILLARY
Glucose-Capillary: 77 mg/dL (ref 65–99)
Glucose-Capillary: 80 mg/dL (ref 65–99)

## 2017-04-25 SURGERY — DIALYSIS/PERMA CATHETER INSERTION
Anesthesia: Moderate Sedation

## 2017-04-25 MED ORDER — APIXABAN 5 MG PO TABS
5.0000 mg | ORAL_TABLET | Freq: Two times a day (BID) | ORAL | Status: DC
  Administered 2017-04-26 – 2017-05-02 (×12): 5 mg via ORAL
  Filled 2017-04-25 (×12): qty 1

## 2017-04-25 MED ORDER — CEFAZOLIN SODIUM-DEXTROSE 1-4 GM/50ML-% IV SOLN
INTRAVENOUS | Status: AC
  Administered 2017-04-25: 1 g
  Filled 2017-04-25: qty 50

## 2017-04-25 MED ORDER — SODIUM CHLORIDE 0.9 % IV SOLN: INTRAVENOUS | Status: DC

## 2017-04-25 MED ORDER — MIDAZOLAM HCL 2 MG/2ML IJ SOLN
INTRAMUSCULAR | Status: AC
  Filled 2017-04-25: qty 2

## 2017-04-25 MED ORDER — MIDAZOLAM HCL 2 MG/2ML IJ SOLN
INTRAMUSCULAR | Status: DC | PRN
Start: 2017-04-25 — End: 2017-04-25
  Administered 2017-04-25: 2 mg via INTRAVENOUS

## 2017-04-25 MED ORDER — LIDOCAINE-EPINEPHRINE (PF) 2 %-1:200000 IJ SOLN
INTRAMUSCULAR | Status: AC
  Filled 2017-04-25: qty 20

## 2017-04-25 MED ORDER — DEXTROSE 5 % IV SOLN: 1.5000 g | INTRAVENOUS | Status: DC

## 2017-04-25 MED ORDER — HEPARIN (PORCINE) IN NACL 2-0.9 UNIT/ML-% IJ SOLN
INTRAMUSCULAR | Status: AC
  Filled 2017-04-25: qty 500

## 2017-04-25 MED ORDER — HEPARIN SODIUM (PORCINE) 10000 UNIT/ML IJ SOLN
INTRAMUSCULAR | Status: AC
  Filled 2017-04-25: qty 1

## 2017-04-25 MED ORDER — FENTANYL CITRATE (PF) 100 MCG/2ML IJ SOLN
INTRAMUSCULAR | Status: DC | PRN
  Administered 2017-04-25: 50 ug via INTRAVENOUS

## 2017-04-25 MED ORDER — FENTANYL CITRATE (PF) 100 MCG/2ML IJ SOLN
INTRAMUSCULAR | Status: AC
  Filled 2017-04-25: qty 2

## 2017-04-25 SURGICAL SUPPLY — 6 items
CATH PALINDROME RT-P 15FX19CM (CATHETERS) ×3 IMPLANT
DERMABOND ADVANCED (GAUZE/BANDAGES/DRESSINGS) ×2
DERMABOND ADVANCED .7 DNX12 (GAUZE/BANDAGES/DRESSINGS) ×1 IMPLANT
PACK ANGIOGRAPHY (CUSTOM PROCEDURE TRAY) ×3 IMPLANT
SUT MNCRL AB 4-0 PS2 18 (SUTURE) ×3 IMPLANT
SUT PROLENE 0 CT 1 30 (SUTURE) ×3 IMPLANT

## 2017-04-25 NOTE — Progress Notes (Signed)
Hd start, alert, no c/o, stable, post cath placement procedure

## 2017-04-25 NOTE — Progress Notes (Signed)
[facility]  ROUNDING NOTE   Subjective:   Saw patient s/p permcath insertion. Pt was somnolent while coming off of anesthesia. He had no complaints at that time.  24 hour urine Cr clearance 5 cc/min  Objective:  Vital signs in last 24 hours:  Temp:  [97.6 F (36.4 C)-98.1 F (36.7 C)] 97.8 F (36.6 C) (09/12 1315) Pulse Rate:  [58-78] 77 (09/12 1320) Resp:  [8-20] 15 (09/12 1320) BP: (142-176)/(87-97) 151/92 (09/12 1320) SpO2:  [88 %-98 %] 97 % (09/12 1320) Weight:  [111.1 kg (245 lb)] 111.1 kg (245 lb) (09/12 1017)  Weight change:  Filed Weights   04/21/17 1748 04/25/17 1017  Weight: 111.1 kg (245 lb) 111.1 kg (245 lb)    Intake/Output: I/O last 3 completed shifts: In: 0  Out: 400 [Urine:400]   Intake/Output this shift:  Total I/O In: -  Out: -24   Physical Exam: General: Chronically ill-appearing, laying in bed, somnolent   Head: +temporal wasting  Eyes: Anicteric  Neck: No venous distention  Lungs:  Clear to auscultation bilaterally, Normal effort   Heart: S1S2 no rubs  Abdomen:  Soft, nontender, obese, pannus    Extremities: improved dependent edema  Neurologic: significant generalized muscle wasting  Skin: Prurigo lesions  Access: RIJ permcath 9/12 Dr. Wyn Quaker    Basic Metabolic Panel:  Recent Labs Lab 04/22/17 0845 04/24/17 0405 04/25/17 0657  NA 139 139 136  K 3.2* 3.4* 3.4*  CL 101 103 101  CO2 GLUCOSE 90 85 83  BUN 21* 23* 29*  CREATININE 2.74* 2.93* 3.50*  CALCIUM 9.0 8.4* 8.6*  PHOS  --  4.2  --     Liver Function Tests:  Recent Labs Lab 04/24/17 0405  ALBUMIN 2.0*   No results for input(s): LIPASE, AMYLASE in the last 168 hours. No results for input(s): AMMONIA in the last 168 hours.  CBC:  Recent Labs Lab 04/22/17 0845 04/24/17 0405  WBC 19.5* 14.8*  HGB 9.0* 9.3*  HCT 28.4* 29.2*  MCV 83.6 83.7  PLT 425 403    Cardiac Enzymes: No results for input(s): CKTOTAL, CKMB, CKMBINDEX, TROPONINI in  the last 168 hours.  BNP: Invalid input(s): POCBNP  CBG:  Recent Labs Lab 04/25/17 1122 04/25/17 1154  GLUCAP 77 80    Microbiology: Results for orders placed or performed during the hospital encounter of 03/30/17  Aerobic/Anaerobic Culture (surgical/deep wound)     Status: Abnormal   Collection Time: 03/30/17  7:56 PM  Result Value Ref Range Status   Specimen Description WOUND LEFT THIGH  Final   Special Requests NONE  Final   Gram Stain   Final    ABUNDANT WBC PRESENT, PREDOMINANTLY PMN ABUNDANT GRAM POSITIVE COCCI ABUNDANT GRAM NEGATIVE RODS MODERATE GRAM POSITIVE RODS MODERATE GRAM NEGATIVE COCCI    Culture (A)  Final    MULTIPLE ORGANISMS PRESENT, NONE PREDOMINANT NO ANAEROBES ISOLATED Performed at [facility], [address] [place], [place] [address]    Report Status 04/05/2017 FINAL  Final  MRSA PCR Screening     Status: Abnormal   Collection Time: 03/30/17  8:58 PM  Result Value Ref Range Status   MRSA by PCR POSITIVE (A) NEGATIVE Final    Comment:        The GeneXpert MRSA Assay (FDA approved for NASAL specimens only), is one component of a comprehensive MRSA colonization surveillance program. It is not intended to diagnose MRSA infection nor to guide or monitor treatment  for MRSA infections. RESULT CALLED TO, READ BACK BY AND VERIFIED WITH: KARA CAMPBELL AT 2230 03/30/17.PMH   Body fluid culture     Status: None   Collection Time: 04/02/17 11:50 AM  Result Value Ref Range Status   Specimen Description PLEURAL  Final   Special Requests NONE  Final   Gram Stain NO WBC SEEN NO ORGANISMS SEEN   Final   Culture   Final    NO GROWTH 3 DAYS Performed at [facility], [address] [place], [place] [address]    Report Status 04/05/2017 FINAL  Final  Acid Fast Smear (AFB)     Status: None   Collection Time: 04/02/17 11:50 AM  Result Value Ref Range Status   AFB Specimen Processing Concentration  Final   Acid Fast Smear Negative   Final    Comment: (NOTE) Performed At: [facility] [address] [place], [place] [id number] Mila HomerHancock William F MD XB:[id number]:[phone]    Source (AFB) PLEURAL  Final    Coagulation Studies: No results for input(s): LABPROT, INR in the last 72 hours.  Urinalysis: No results for input(s): COLORURINE, LABSPEC, PHURINE, GLUCOSEU, HGBUR, BILIRUBINUR, KETONESUR, PROTEINUR, UROBILINOGEN, NITRITE, LEUKOCYTESUR in the last 72 hours.  Invalid input(s): APPERANCEUR    Imaging: No results found.   Medications:    . amLODipine  5 mg Oral Daily  . [START ON 04/26/2017] apixaban  5 mg Oral BID  . bisoprolol  10 mg Oral Daily  . buPROPion  100 mg Oral BID  . collagenase  1 application Topical Daily  . docusate sodium  100 mg Oral BID  . epoetin (EPOGEN/PROCRIT) injection  10,000 Units Intravenous Q M,W,F-HD  . ferrous sulfate  325 mg Oral Q1200  . irbesartan  300 mg Oral QHS  . levothyroxine  75 mcg Oral QAC breakfast  . multivitamin  1 tablet Oral QHS  . sertraline  50 mg Oral Daily   acetaminophen **OR** acetaminophen, HYDROcodone-acetaminophen, ondansetron **OR** ondansetron (ZOFRAN) IV  Assessment/ Plan:  [name] is a 36 y.o. white male with hypertension, morbid obesity, pulmonary hypertension, hypothyroidism, hyperlipidemia, depression/anxiety, coronary artery disease, congestive heart failure, who was [facility]  1. ESRD -24 urine cr clerance is 5cc/min - dialysis three times per week - will attempt to dialyse in chair tomorrow - start discharge planning for outpatient hemodialysis  2. Anasarca, with hypertension, proteinuria. Echocardiogram with normal systolic function 8/18. No visualization of hepatic cirrhosis on CT abd/pelvis Status post large volume paracentesis on 9/5 7 liters. Left thoracentesis on 8/27 - Currently not on diuretics  3. Anemia of chronic kidney disease:  - trend is improving.    4. Generalized weakness with muscle  wasting. No muscle biopsy performed.  Neurology and rheumatology evaluation performed. - holding statin    LOS: 4 Leith Szafranski 9/12/20183:38 PM

## 2017-04-25 NOTE — Progress Notes (Signed)
PT Cancellation Note  Patient Details Name: Brian FlackJoseph Romain MRN: [id number] DOB: 11/21/1980   Cancelled Treatment:    Reason Eval/Treat Not Completed: Patient at procedure or test/unavailable (Patient currently off unit for perm-cath placement.  Will re-attempt at later time/date as patient medically appropriate and available.)   Remi Lopata H. Manson PasseyBrown, PT, DPT, NCS 04/25/17, 11:07 AM [phone]

## 2017-04-25 NOTE — Progress Notes (Signed)
Patient received new line to right chest for dialysis, had dialysis and stated that he did not feel up to getting up to the chair today. Attempted to encourage patient of the need to get up and he still refused. Patient stated that he may feel better tomorrow and will get in the chair.

## 2017-04-25 NOTE — Progress Notes (Signed)
Post hd vitals 

## 2017-04-25 NOTE — Op Note (Signed)
OPERATIVE NOTE    PRE-OPERATIVE DIAGNOSIS: 1. ESRD   POST-OPERATIVE DIAGNOSIS: same as above  PROCEDURE: 1. Ultrasound guidance for vascular access to the right internal jugular vein 2. Fluoroscopic guidance for placement of catheter 3. Placement of a 19 cm tip to cuff tunneled hemodialysis catheter via the right internal jugular vein  SURGEON: Brian BarrenJason Qiana Landgrebe, MD  ANESTHESIA:  Local with Moderate conscious sedation for approximately 20 minutes using 2 mg of Versed and 50 mcg of Fentanyl  ESTIMATED BLOOD LOSS: 10 cc  FLUORO TIME: less than one minute  CONTRAST: none  FINDING(S): 1.  Patent right internal jugular vein  SPECIMEN(S):  None  INDICATIONS:   [name] is a 36 y.o.male who presents with renal failure.  The patient needs long term dialysis access for their ESRD, and a Permcath is necessary.  Risks and benefits are discussed and informed consent is obtained.    DESCRIPTION: After obtaining full informed written consent, the patient was brought back to the vascular suited. [name] right neck and chest were sterilely prepped and draped in a sterile surgical field was created. Moderate conscious sedation was administered during a face to face encounter with the patient throughout the procedure with my supervision of the RN administering medicines and monitoring the patient's vital signs, pulse oximetry, telemetry and mental status throughout from the start of the procedure until the patient was taken to the recovery room.  The right internal jugular vein was visualized with ultrasound and found to be patent. It was then accessed under direct ultrasound guidance and a permanent image was recorded. A wire was placed. After skin nick and dilatation, the peel-away sheath was placed over the wire. I then turned my attention to an area under the clavicle. Approximately 1-2 fingerbreadths below the clavicle a small counterincision was created and tunneled from the subclavicular  incision to the access site. Using fluoroscopic guidance, a 19 centimeter tip to cuff tunneled hemodialysis catheter was selected, and tunneled from the subclavicular incision to the access site. It was then placed through the peel-away sheath and the peel-away sheath was removed. Using fluoroscopic guidance the catheter tips were parked in the right atrium. The appropriate distal connectors were placed. It withdrew blood well and flushed easily with heparinized saline and a concentrated heparin solution was then placed. It was secured to the chest wall with 2 Prolene sutures. The access incision was closed single 4-0 Monocryl. A 4-0 Monocryl pursestring suture was placed around the exit site. Sterile dressings were placed. The patient tolerated the procedure well and was taken to the recovery room in stable condition.  COMPLICATIONS: None  CONDITION: Stable  Brian BarrenJason Estreya Clay, MD 04/25/2017 11:24 AM   This note was created with Dragon Medical transcription system. Any errors in dictation are purely unintentional.

## 2017-04-25 NOTE — Clinical Social Work Note (Signed)
CSW continues to follow. Permcath for dialysis to be placed today. Currently awaiting to find out if patient will be considered ESRD. Patient to return to [facility] when time. [place] SpanielMonica Lorene Samaan MSW,LCSW [phone]

## 2017-04-25 NOTE — OR Nursing (Signed)
Very lethargic checked per dr dwe order, 77. Aroused when moved to bed.

## 2017-04-25 NOTE — H&P (Signed)
[facility] History & Physical Update  The patient was interviewed and re-examined.  [name] previous History and Physical has been reviewed and is unchanged. He remains in renal failure, and now needs tunneled access for long term use. There is no change in the plan of care. We plan to proceed with the scheduled procedure.  Festus BarrenJason Zyion Leidner, MD  04/25/2017, 10:24 AM

## 2017-04-25 NOTE — Progress Notes (Signed)
  End of hd 

## 2017-04-25 NOTE — Progress Notes (Signed)
Post hd assessment 

## 2017-04-25 NOTE — Progress Notes (Signed)
Pre hd info 

## 2017-04-25 NOTE — Progress Notes (Signed)
[employer] - [place] at [facility]   PATIENT NAME: Brian FlackJoseph [name]    MR#:  [id number]  DATE OF BIRTH:  02-Nov-1980  SUBJECTIVE:   S/p Perm cath placement today and seen at HD tolerating it well. No complaints presently.   REVIEW OF SYSTEMS:    Review of Systems  Constitutional: Negative for chills and fever.  HENT: Negative for congestion and tinnitus.   Eyes: Negative for blurred vision and double vision.  Respiratory: Negative for cough, shortness of breath and wheezing.   Cardiovascular: Negative for chest pain, orthopnea and PND.  Gastrointestinal: Negative for abdominal pain, diarrhea, nausea and vomiting.  Genitourinary: Negative for dysuria and hematuria.  Musculoskeletal: Positive for joint pain (left leg).  Neurological: Positive for weakness. Negative for dizziness, sensory change and focal weakness.  All other systems reviewed and are negative.   Nutrition: Renal Tolerating Diet: Yes Tolerating PT: Await Eval.   DRUG ALLERGIES:  No Known Allergies  VITALS:  Blood pressure (!) 151/92, pulse 77, temperature 97.8 F (36.6 C), temperature source Oral, resp. rate 15, height 5\' 9"  (1.753 m), weight 111.1 kg (245 lb), SpO2 97 %.  PHYSICAL EXAMINATION:   Physical Exam  GENERAL:  36 y.o.-year-old morbidly obese patient lying in bed in no acute distress.  EYES: Pupils equal, round, reactive to light and accommodation. No scleral icterus. Extraocular muscles intact.  HEENT: Head atraumatic, normocephalic. Oropharynx and nasopharynx clear.  NECK:  Supple, no jugular venous distention. No thyroid enlargement, no tenderness.  LUNGS: Normal breath sounds bilaterally, no wheezing, rales, rhonchi. No use of accessory muscles of respiration.  CARDIOVASCULAR: S1, S2 normal. No murmurs, rubs, or gallops.  ABDOMEN: Soft, nontender, nondistended. Bowel sounds present. No organomegaly or mass.  EXTREMITIES: No cyanosis, clubbing or edema b/l.    NEUROLOGIC: Cranial  nerves II through XII are intact. No focal Motor or sensory deficits b/l. Globally weak.    PSYCHIATRIC: The patient is alert and oriented x 3.  SKIN: No obvious rash, lesion, left lower extremity pressure ulcer on the left upper thigh.   Right chest wall temp dialysis cath in place.   LABORATORY PANEL:   CBC  Recent Labs Lab 04/24/17 0405  WBC 14.8*  HGB 9.3*  HCT 29.2*  PLT 403   ------------------------------------------------------------------------------------------------------------------  Chemistries   Recent Labs Lab 04/25/17 0657  NA 136  K 3.4*  CL 101  CO2 29  GLUCOSE 83  BUN 29*  CREATININE 3.50*  CALCIUM 8.6*   ------------------------------------------------------------------------------------------------------------------  Cardiac Enzymes No results for input(s): TROPONINI in the last 168 hours. ------------------------------------------------------------------------------------------------------------------  RADIOLOGY:  No results found.   ASSESSMENT AND PLAN:   Brian FlackJoseph [name]  is a 36 y.o. male with a known history of CK D with nephrotic range proteinuria, diastolic CHF, chronic lower extremity edema with ulcerations, pulmonary hypertension, generalized anxiety disorder and depression, hypertension who was recently admitted to [facility] from 03/30/2017 and was transferred to [facility] on 04/16/2017 for rheumatology evaluation and consideration of a muscle biopsy is being sent back for further management.  #1 acute renal failure on chronic kidney disease stage 3-appreciate nephrology consult and pt. Has nephrotic range proteinuria but etiology remains unclear. -Patient remains on Tuesday, Thursday and Saturday hemodialysis. Renal ultrasound showing no hydronephrosis but worsening renal atrophy. - Kidney biopsy was attempted at [facility], however unable to be done due to his body habitus and positioning. - needs outpatient spot for HD but needs to deemed  ESRD.   - 24 hr. Cr. Clearance  is < 5. Has ESRD now. S/p Perm Cath placement today and had HD today. Cont. further care as per Nephro. Will need outpatient spot for HD and be able to sit up for dialysis.   #2 acute hypoxic respiratory failure-secondary to diastolic CHF and pleural effusion. - was on Bipap and now weaned and cont. [place] O2 for now.  - s/p thoracentesis in the past and now doing well.   #3 anasarca- s/p thoracentesis and also Paracentesis.  - cont. Dialysis for fluid removal and watch volume status. Currently not on diuretics.   #4 significant muscle weakness- seen by Neurology and rheumatology at [facility]. They did not consider muscle biopsy as they believe that the weakness is more from deconditioning and malnutrition rather than any inflammatory myositis. CK's, Aldolase have been normal.   #5 hypertension-continue bisoprolol, irbesartan and Norvasc. - BP stable.   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 Eliquis.   #7 acute on chronic anemia-received transfusion prior to transfer to [facility]. Hg. Stable and no need for transfusion. - cont. Epogen with dialysis and IV iron if needed with dialysis.  #8 LLE pressure ulcer - on left upper thigh. Cont. Local wound care as per wound team.     All the records are reviewed and case discussed with Care Management/Social Worker. Management plans discussed with the patient, family and they are in agreement.  CODE STATUS: Full code  DVT Prophylaxis: Eliquis  TOTAL TIME TAKING CARE OF THIS PATIENT: 30 minutes.   POSSIBLE D/C in next 2-3 days, DEPENDING ON CLINICAL CONDITION.   Houston Siren M.D on 04/25/2017 at 3:02 PM  Between 7am to 6pm - Pager - [phone]  After 6pm go to [email or web address] - Social research officer, government  [employer]  Hospitalists  Office  [phone]  CC: Primary care physician; Patient, No Pcp Per

## 2017-04-25 NOTE — Progress Notes (Signed)
Pre hd assessment  

## 2017-04-26 ENCOUNTER — Encounter: Payer: Self-pay | Admitting: Vascular Surgery

## 2017-04-26 LAB — RENAL FUNCTION PANEL
Albumin: 2 g/dL — ABNORMAL LOW (ref 3.5–5.0)
Anion gap: 7 (ref 5–15)
BUN: 35 mg/dL — AB (ref 6–20)
CALCIUM: 8.4 mg/dL — AB (ref 8.9–10.3)
CHLORIDE: 101 mmol/L (ref 101–111)
CO2: 29 mmol/L (ref 22–32)
CREATININE: 3.96 mg/dL — AB (ref 0.61–1.24)
GFR calc non Af Amer: 18 mL/min — ABNORMAL LOW (ref >60)
GFR, EST AFRICAN AMERICAN: 21 mL/min — AB (ref >60)
GLUCOSE: 80 mg/dL (ref 65–99)
Phosphorus: 5.3 mg/dL — ABNORMAL HIGH (ref 2.5–4.6)
Potassium: 3.6 mmol/L (ref 3.5–5.1)
SODIUM: 137 mmol/L (ref 135–145)

## 2017-04-26 LAB — CBC
HCT: 29.6 % — ABNORMAL LOW (ref 40.0–52.0)
Hemoglobin: 9.2 g/dL — ABNORMAL LOW (ref 13.0–18.0)
MCH: 26.3 pg (ref 26.0–34.0)
MCHC: 31.1 g/dL — ABNORMAL LOW (ref 32.0–36.0)
MCV: 84.3 fL (ref 80.0–100.0)
PLATELETS: 456 10*3/uL — AB (ref 150–440)
RBC: 3.52 MIL/uL — AB (ref 4.40–5.90)
RDW: 17.1 % — AB (ref 11.5–14.5)
WBC: 14.4 10*3/uL — ABNORMAL HIGH (ref 3.8–10.6)

## 2017-04-26 MED ORDER — AMLODIPINE BESYLATE 10 MG PO TABS
10.0000 mg | ORAL_TABLET | Freq: Every day | ORAL | Status: DC
  Administered 2017-04-27 – 2017-05-02 (×6): 10 mg via ORAL
  Filled 2017-04-26 (×7): qty 1

## 2017-04-26 NOTE — Progress Notes (Signed)
[facility]  ROUNDING NOTE   Subjective:   Patient had a new PermCath placed yesterday Blood flow in the new dialysis catheter was slow therefore he got only about one hour treatment He was rescheduled for today Denies any acute complaints.  Ate cereal for breakfast without nausea or vomiting.  Objective:  Vital signs in last 24 hours:  Temp:  [97.8 F (36.6 C)-98.4 F (36.9 C)] 98.4 F (36.9 C) (09/13 0439) Pulse Rate:  [64-78] 64 (09/13 0439) Resp:  [15-20] 20 (09/13 0439) BP: (142-173)/(92-99) 173/98 (09/13 0439) SpO2:  [97 %-99 %] 99 % (09/13 0439)  Weight change:  Filed Weights   04/21/17 1748 04/25/17 1017  Weight: 111.1 kg (245 lb) 111.1 kg (245 lb)    Intake/Output: I/O last 3 completed shifts: In: -  Out: 196 [Urine:220]   Intake/Output this shift:  No intake/output data recorded.  Physical Exam: General: Chronically ill-appearing, laying in bed, somnolent   Head: +temporal wasting  Eyes: Anicteric  Neck: No venous distention  Lungs:  Clear to auscultation bilaterally, Normal effort   Heart: S1S2 no rubs  Abdomen:  Soft, nontender, obese, pannus    Extremities: improved dependent edema  Neurologic: significant generalized muscle wasting  Skin: Prurigo lesions  Access: RIJ permcath 9/12 Dr. Wyn Quaker    Basic Metabolic Panel:  Recent Labs Lab 04/22/17 0845 04/24/17 0405 04/25/17 0657  NA 139 139 136  K 3.2* 3.4* 3.4*  CL 101 103 101  CO2 GLUCOSE 90 85 83  BUN 21* 23* 29*  CREATININE 2.74* 2.93* 3.50*  CALCIUM 9.0 8.4* 8.6*  PHOS  --  4.2  --     Liver Function Tests:  Recent Labs Lab 04/24/17 0405  ALBUMIN 2.0*   No results for input(s): LIPASE, AMYLASE in the last 168 hours. No results for input(s): AMMONIA in the last 168 hours.  CBC:  Recent Labs Lab 04/22/17 0845 04/24/17 0405  WBC 19.5* 14.8*  HGB 9.0* 9.3*  HCT 28.4* 29.2*  MCV 83.6 83.7  PLT 425 403    Cardiac Enzymes: No results for input(s):  CKTOTAL, CKMB, CKMBINDEX, TROPONINI in the last 168 hours.  BNP: Invalid input(s): POCBNP  CBG:  Recent Labs Lab 04/25/17 1122 04/25/17 1154  GLUCAP 77 80    Microbiology: Results for orders placed or performed during the hospital encounter of 03/30/17  Aerobic/Anaerobic Culture (surgical/deep wound)     Status: Abnormal   Collection Time: 03/30/17  7:56 PM  Result Value Ref Range Status   Specimen Description WOUND LEFT THIGH  Final   Special Requests NONE  Final   Gram Stain   Final    ABUNDANT WBC PRESENT, PREDOMINANTLY PMN ABUNDANT GRAM POSITIVE COCCI ABUNDANT GRAM NEGATIVE RODS MODERATE GRAM POSITIVE RODS MODERATE GRAM NEGATIVE COCCI    Culture (A)  Final    MULTIPLE ORGANISMS PRESENT, NONE PREDOMINANT NO ANAEROBES ISOLATED Performed at [facility], [address], [place], [place] [address]    Report Status 04/05/2017 FINAL  Final  MRSA PCR Screening     Status: Abnormal   Collection Time: 03/30/17  8:58 PM  Result Value Ref Range Status   MRSA by PCR POSITIVE (A) NEGATIVE Final    Comment:        The GeneXpert MRSA Assay (FDA approved for NASAL specimens only), is one component of a comprehensive MRSA colonization surveillance program. It is not intended to diagnose MRSA infection nor to guide or monitor treatment for MRSA  infections. RESULT CALLED TO, READ BACK BY AND VERIFIED WITH: KARA CAMPBELL AT 2230 03/30/17.PMH   Body fluid culture     Status: None   Collection Time: 04/02/17 11:50 AM  Result Value Ref Range Status   Specimen Description PLEURAL  Final   Special Requests NONE  Final   Gram Stain NO WBC SEEN NO ORGANISMS SEEN   Final   Culture   Final    NO GROWTH 3 DAYS Performed at [facility], [address] [place], [place] [address]    Report Status 04/05/2017 FINAL  Final  Acid Fast Smear (AFB)     Status: None   Collection Time: 04/02/17 11:50 AM  Result Value Ref Range Status   AFB Specimen Processing Concentration   Final   Acid Fast Smear Negative  Final    Comment: (NOTE) Performed At: [facility] [address] [place], [place] [id number] Mila HomerHancock William F MD XB:[id number]:[phone]    Source (AFB) PLEURAL  Final    Coagulation Studies: No results for input(s): LABPROT, INR in the last 72 hours.  Urinalysis: No results for input(s): COLORURINE, LABSPEC, PHURINE, GLUCOSEU, HGBUR, BILIRUBINUR, KETONESUR, PROTEINUR, UROBILINOGEN, NITRITE, LEUKOCYTESUR in the last 72 hours.  Invalid input(s): APPERANCEUR    Imaging: No results found.   Medications:    . amLODipine  5 mg Oral Daily  . apixaban  5 mg Oral BID  . bisoprolol  10 mg Oral Daily  . buPROPion  100 mg Oral BID  . collagenase  1 application Topical Daily  . docusate sodium  100 mg Oral BID  . epoetin (EPOGEN/PROCRIT) injection  10,000 Units Intravenous Q M,W,F-HD  . ferrous sulfate  325 mg Oral Q1200  . irbesartan  300 mg Oral QHS  . levothyroxine  75 mcg Oral QAC breakfast  . multivitamin  1 tablet Oral QHS  . sertraline  50 mg Oral Daily   acetaminophen **OR** acetaminophen, HYDROcodone-acetaminophen, ondansetron **OR** ondansetron (ZOFRAN) IV  Assessment/ Plan:  Mr. Nils FlackJoseph Dunker is a 36 y.o. white male with hypertension, morbid obesity, pulmonary hypertension, hypothyroidism, hyperlipidemia, depression/anxiety, coronary artery disease, congestive heart failure, who was [facility] on 03/30/2017  1. ESRD -24 urine cr clerance is 5cc/min - dialysis three times per week - will attempt to dialyse in chair today. Patient has been refusing to sit in the chair thinking that he might not be able to tolerate it.  - start discharge planning for outpatient hemodialysis  2. Anasarca, with hypertension, proteinuria. Echocardiogram with normal systolic function 8/18. No visualization of hepatic cirrhosis on CT abd/pelvis Status post large volume paracentesis on 9/5 for 7 liters. Left thoracentesis on 8/27 - Currently not on  diuretics  3. Anemia of chronic kidney disease:  - trend is improving.    4. Generalized weakness with muscle wasting. No muscle biopsy performed.  Neurology and rheumatology evaluation performed. - holding statin    LOS: 5 Cadell Gabrielson 9/13/201812:51 PM

## 2017-04-26 NOTE — Progress Notes (Signed)
PT Cancellation Note  Patient Details Name: Nils FlackJoseph Whitwell MRN: [id number] DOB: [birth year]   Cancelled Treatment:    Reason Eval/Treat Not Completed: Patient at procedure or test/unavailable (Patient now off unit at dialysis; will re-attempt at later time/date as medically appropriate.)   Jettie Lazare H. Manson PasseyBrown, PT, DPT, NCS 04/26/17, 2:54 PM [phone]

## 2017-04-26 NOTE — Progress Notes (Signed)
Post hd vitals 

## 2017-04-26 NOTE — Progress Notes (Signed)
End of hd, pt ended tx early d/t being restless and uncomfortable in hd chair, Thedore MinsSingh MD aware, alert, no other c/o, stable, report to primary RN.

## 2017-04-26 NOTE — Progress Notes (Signed)
Hd start, pt alert, stable, in chair for hd, says he feels fatigue and generalized not feeling well.

## 2017-04-26 NOTE — Progress Notes (Signed)
Pre hd info 

## 2017-04-26 NOTE — Progress Notes (Signed)
Patient encouraged to get up to the chair for breakfast and patient declined to at the time patient medicated for pain.

## 2017-04-26 NOTE — Progress Notes (Signed)
Post hd assessment 

## 2017-04-26 NOTE — Progress Notes (Signed)
PT Cancellation Note  Patient Details Name: [name] MRN: [id number] DOB: 1980/12/19   Cancelled Treatment:    Reason Eval/Treat Not Completed: Patient declined, no reason specified (Treatment session attempted.  Patient declined at this time, "I'm just not feeling well today".  Reports pain in bilat LEs, generalized fatigue; denied needs/assist from therapist at this time.  will re-attempt in PM or next date, time permitting.)   [name], PT, DPT, NCS 04/26/17, 11:37 AM [phone]

## 2017-04-26 NOTE — Progress Notes (Signed)
Pre hd assessment  

## 2017-04-26 NOTE — Progress Notes (Signed)
[employer] - [place] at [facility]   PATIENT NAME: [name]    MR#:  [id number]  DATE OF BIRTH:  1981-06-17  SUBJECTIVE:   S/p Perm Cath yesterday and had HD but flow through the cath was low and therefore only had 1 to 1/2 hr of treatment.  Plan for HD again today.  Plan to use hoyer lift to get patient up to chair later today.   REVIEW OF SYSTEMS:    Review of Systems  Constitutional: Negative for chills and fever.  HENT: Negative for congestion and tinnitus.   Eyes: Negative for blurred vision and double vision.  Respiratory: Negative for cough, shortness of breath and wheezing.   Cardiovascular: Negative for chest pain, orthopnea and PND.  Gastrointestinal: Negative for abdominal pain, diarrhea, nausea and vomiting.  Genitourinary: Negative for dysuria and hematuria.  Musculoskeletal: Positive for joint pain (left leg).  Neurological: Positive for weakness. Negative for dizziness, sensory change and focal weakness.  All other systems reviewed and are negative.   Nutrition: Renal Tolerating Diet: Yes Tolerating PT: Await Eval.   DRUG ALLERGIES:  No Known Allergies  VITALS:  Blood pressure (!) 157/103, pulse (!) 57, temperature 97.6 F (36.4 C), temperature source Oral, resp. rate 13, height  (1.753 m), weight 111.1 kg (245 lb), SpO2 100 %.  PHYSICAL EXAMINATION:   Physical Exam  GENERAL:  36 y.o.-year-old morbidly obese patient lying in bed in no acute distress.  EYES: Pupils equal, round, reactive to light and accommodation. No scleral icterus. Extraocular muscles intact.  HEENT: Head atraumatic, normocephalic. Oropharynx and nasopharynx clear.  NECK:  Supple, no jugular venous distention. No thyroid enlargement, no tenderness.  LUNGS: Normal breath sounds bilaterally, no wheezing, rales, rhonchi. No use of accessory muscles of respiration.  CARDIOVASCULAR: S1, S2 normal. No murmurs, rubs, or gallops.  ABDOMEN: Soft, nontender, nondistended.  Bowel sounds present. No organomegaly or mass.  EXTREMITIES: No cyanosis, clubbing or edema b/l.    NEUROLOGIC: Cranial nerves II through XII are intact. No focal Motor or sensory deficits b/l. Globally weak.    PSYCHIATRIC: The patient is alert and oriented x 3.  SKIN: No obvious rash, lesion, left lower extremity pressure ulcer on the left upper thigh.   Right chest wall perm dialysis cath in place.   LABORATORY PANEL:   CBC  Recent Labs Lab 04/24/17 0405  WBC 14.8*  HGB 9.3*  HCT 29.2*  PLT 403   ------------------------------------------------------------------------------------------------------------------  Chemistries   Recent Labs Lab 04/25/17 0657  NA 136  K 3.4*  CL 101  CO2 29  GLUCOSE 83  BUN 29*  CREATININE 3.50*  CALCIUM 8.6*   ------------------------------------------------------------------------------------------------------------------  Cardiac Enzymes No results for input(s): TROPONINI in the last 168 hours. ------------------------------------------------------------------------------------------------------------------  RADIOLOGY:  No results found.   ASSESSMENT AND PLAN:   [name]  is a 36 y.o. male with a known history of CK D with nephrotic range proteinuria, diastolic CHF, chronic lower extremity edema with ulcerations, pulmonary hypertension, generalized anxiety disorder and depression, hypertension who was recently admitted to [facility] from 03/30/2017 and was transferred to [facility] on 04/16/2017 for rheumatology evaluation and consideration of a muscle biopsy is being sent back for further management.  #1 acute renal failure on chronic kidney disease stage 3-appreciate nephrology consult and pt. Has nephrotic range proteinuria but etiology remains unclear. -Patient remains on Tuesday, Thursday and Saturday hemodialysis. Renal ultrasound showing no hydronephrosis but worsening renal atrophy. - Kidney biopsy was attempted at [facility],  however unable to be done due to his body habitus and positioning. - needs outpatient spot for HD but needs to deemed ESRD.   - 24 hr. Cr. Clearance is < 5. Has ESRD now. S/p Perm Cath placement yesterday and plan for HD again today - will need outpatient spot for HD once pt. Is able to sit up for dialysis.    #2 acute hypoxic respiratory failure-secondary to diastolic CHF and pleural effusion. - was on Bipap and now weaned and cont. [place] O2 for now.  - s/p thoracentesis in the past and now doing well.   #3 anasarca- s/p thoracentesis and also Paracentesis.  - cont. Dialysis for fluid removal and watch volume status which is stable.   #4 significant muscle weakness- seen by Neurology and [facility]ology at [facility]. They did not consider muscle biopsy as they believe that the weakness is more from deconditioning and malnutrition rather than any inflammatory myositis. CK's, Aldolase have been normal.   #5 hypertension- BP a bit on high side.  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 Eliquis.   #7 acute on chronic anemia-received transfusion [facility]. Hg. Stable and no need for transfusion. - cont. Epogen with dialysis and IV iron if needed with dialysis.  #8 LLE pressure ulcer - on left upper thigh. Cont. Local wound care as per wound team.     All the records are reviewed and case discussed with Care Management/Social Worker. Management plans discussed with the patient, family and they are in agreement.  CODE STATUS: Full code  DVT Prophylaxis: Eliquis  TOTAL TIME TAKING CARE OF THIS PATIENT: 25 minutes.   POSSIBLE D/C when able to sit up for HD and outpatient spot of HD arranged.   Houston SirenSAINANI,VIVEK J M.D on 04/26/2017 at 2:38 PM  Between 7am to 6pm - Pager - [phone]  After 6pm go to [email or web address] - Social research officer, governmentpassword EPAS [facility]  [employer] [place] Hospitalists  Office   [phone]  CC: Primary care physician; Patient, No Pcp Per

## 2017-04-26 NOTE — Progress Notes (Signed)
Patient off floor in dialysis. Patient transported to dialysis in the chair.

## 2017-04-27 MED ORDER — HEPARIN SODIUM (PORCINE) 1000 UNIT/ML IJ SOLN
3000.0000 [IU] | Freq: Once | INTRAMUSCULAR | Status: AC
  Administered 2017-04-27: 3000 [IU]

## 2017-04-27 MED ORDER — MORPHINE SULFATE (PF) 2 MG/ML IV SOLN
2.0000 mg | Freq: Four times a day (QID) | INTRAVENOUS | Status: DC | PRN
  Administered 2017-04-27 – 2017-04-29 (×7): 2 mg via INTRAVENOUS
  Filled 2017-04-27 (×9): qty 1

## 2017-04-27 NOTE — Progress Notes (Signed)
RN, NT, and transporter helped pt to dialysis chair with hoyer lift for treatment. Pt transported to dialysis in chair with NT and transporter.   Shomari Scicchitano [employer]

## 2017-04-27 NOTE — Clinical Social Work Note (Signed)
It was relayed to this CSW in progression rounds this morning that patient was able to sit up in his chair for his dialysis treatment yesterday. York Spaniel MSW,LCSW [phone]

## 2017-04-27 NOTE — Progress Notes (Signed)
[employer] - [place] at [facility]   PATIENT NAME: [name]    MR#:  [id number]  DATE OF BIRTH:  1980-10-13  SUBJECTIVE:   Pt. Seen at HD and tolerating it well. He is complaining of pain in his legs b/l.  Awaiting outpatient spot for HD.   REVIEW OF SYSTEMS:    Review of Systems  Constitutional: Negative for chills and fever.  HENT: Negative for congestion and tinnitus.   Eyes: Negative for blurred vision and double vision.  Respiratory: Negative for cough, shortness of breath and wheezing.   Cardiovascular: Negative for chest pain, orthopnea and PND.  Gastrointestinal: Negative for abdominal pain, diarrhea, nausea and vomiting.  Genitourinary: Negative for dysuria and hematuria.  Musculoskeletal: Positive for joint pain (left leg).  Neurological: Positive for weakness. Negative for dizziness, sensory change and focal weakness.  All other systems reviewed and are negative.   Nutrition: Renal Tolerating Diet: Yes Tolerating PT: Eval noted.   DRUG ALLERGIES:  No Known Allergies  VITALS:  Blood pressure (!) 155/94, pulse 73, temperature 97.6 F (36.4 C), temperature source Oral, resp. rate 18, height  (1.753 m), weight 111.1 kg (245 lb), SpO2 96 %.  PHYSICAL EXAMINATION:   Physical Exam  GENERAL:  36 y.o.-year-old morbidly obese patient lying in bed in no acute distress.  EYES: Pupils equal, round, reactive to light and accommodation. No scleral icterus. Extraocular muscles intact.  HEENT: Head atraumatic, normocephalic. Oropharynx and nasopharynx clear.  NECK:  Supple, no jugular venous distention. No thyroid enlargement, no tenderness.  LUNGS: Normal breath sounds bilaterally, no wheezing, rales, rhonchi. No use of accessory muscles of respiration.  CARDIOVASCULAR: S1, S2 normal. No murmurs, rubs, or gallops.  ABDOMEN: Soft, nontender, nondistended. Bowel sounds present. No organomegaly or mass.  EXTREMITIES: No cyanosis, clubbing or edema b/l.     NEUROLOGIC: Cranial nerves II through XII are intact. No focal Motor or sensory deficits b/l. Globally weak.    PSYCHIATRIC: The patient is alert and oriented x 3.  SKIN: No obvious rash, lesion, left lower extremity pressure ulcer on the left upper thigh.   Right chest wall perm dialysis cath in place.   LABORATORY PANEL:   CBC  Recent Labs Lab 04/26/17 1455  WBC 14.4*  HGB 9.2*  HCT 29.6*  PLT 456*   ------------------------------------------------------------------------------------------------------------------  Chemistries   Recent Labs Lab 04/26/17 1455  NA 137  K 3.6  CL 101  CO2 29  GLUCOSE 80  BUN 35*  CREATININE 3.96*  CALCIUM 8.4*   ------------------------------------------------------------------------------------------------------------------  Cardiac Enzymes No results for input(s): TROPONINI in the last 168 hours. ------------------------------------------------------------------------------------------------------------------  RADIOLOGY:  No results found.   ASSESSMENT AND PLAN:   Chivas Notz  is a 36 y.o. male with a known history of CK D with nephrotic range proteinuria, diastolic CHF, chronic lower extremity edema with ulcerations, pulmonary hypertension, generalized anxiety disorder and depression, hypertension who was recently admitted to [facility] from 03/30/2017 and was transferred to [facility] on 04/16/2017 for rheumatology evaluation and consideration of a muscle biopsy is being sent back for further management.  #1 acute renal failure on chronic kidney disease stage 3-appreciate nephrology consult and pt. Has nephrotic range proteinuria and etiology unclear.  -Patient remains on Tuesday, Thursday and Saturday hemodialysis. Renal ultrasound showing no hydronephrosis but worsening renal atrophy. - Kidney biopsy was attempted at [facility], however unable to be done due to his body habitus and positioning. - needs outpatient spot for HD as pt  is ESRD  now.    - 24 hr. Cr. Clearance is < 5.  S/p Perm Cath placement and getting HD today.   - will need outpatient spot for HD.    #2 acute hypoxic respiratory failure-secondary to diastolic CHF and pleural effusion. - was on Bipap and now weaned and cont. [place] O2 for now.  Denies any shortness of breath.  - s/p thoracentesis in the past and now doing well.   #3 anasarca- s/p thoracentesis and also Paracentesis.  - cont. Dialysis for fluid removal and watch volume status which is stable.   #4 significant muscle weakness- seen by Neurology and rheumatology at [facility]. They did not consider muscle biopsy as they believe that the weakness is more from deconditioning and malnutrition rather than any inflammatory myositis. CK's, Aldolase have been normal.   #5 hypertension- cont. 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 Eliquis.   #7 acute on chronic anemia-received transfusion prior to transfer to [facility]. Hg. Presently Stable and no need for transfusion. - cont. Epogen with dialysis and IV iron if needed with dialysis.  #8 LLE pressure ulcer - on left upper thigh. Cont. Local wound care.    All the records are reviewed and case discussed with Care Management/Social Worker. Management plans discussed with the patient, family and they are in agreement.  CODE STATUS: Full code  DVT Prophylaxis: Eliquis  TOTAL TIME TAKING CARE OF THIS PATIENT: 25 minutes.   POSSIBLE D/C when pt. Has an outpatient spot for HD.   Houston Siren M.D on 04/27/2017 at 2:59 PM  Between 7am to 6pm - Pager - [phone]  After 6pm go to [email or web address] - Social research officer, government  [facility] [place] [facility]  Office  [phone]  CC: Primary care physician; Patient, No Pcp Per

## 2017-04-27 NOTE — Progress Notes (Signed)
[facility]  ROUNDING NOTE   Subjective:     HEMODIALYSIS FLOWSHEET:  Blood Flow Rate (mL/min): 350 mL/min Arterial Pressure (mmHg): -190 mmHg Venous Pressure (mmHg): 130 mmHg Transmembrane Pressure (mmHg): 50 mmHg Ultrafiltration Rate (mL/min): 570 mL/min Dialysate Flow Rate (mL/min): 800 ml/min Conductivity: Machine : 14 Conductivity: Machine : 14 Dialysis Fluid Bolus: Normal Saline Bolus Amount (mL): 200 mL Dialysate Change:  (3k)  Patient is undergoing dialysis sitting up in the chair. He completed 2-1/2 hours of treatment in chair on Thursday Able to eat breakfast without any nausea or vomiting   Objective:  Vital signs in last 24 hours:  Temp:  [97.5 F (36.4 C)-98.3 F (36.8 C)] 98 F (36.7 C) (09/14 1100) Pulse Rate:  [57-73] 67 (09/14 1230) Resp:  [13-20] 14 (09/14 1230) BP: (149-168)/(88-105) 160/97 (09/14 1230) SpO2:  [97 %-100 %] 100 % (09/14 1230)  Weight change:  Filed Weights   04/21/17 1748 04/25/17 1017  Weight: 111.1 kg (245 lb) 111.1 kg (245 lb)    Intake/Output: I/O last 3 completed shifts: In: -  Out: 1045 [Urine:145; Other:900]   Intake/Output this shift:  No intake/output data recorded.  Physical Exam: General: Chronically ill-appearing,   Head: +temporal wasting  Eyes: Anicteric  Neck: No venous distention  Lungs:  Clear to auscultation bilaterally, Normal effort   Heart: S1S2 no rubs  Abdomen:  Soft, nontender, obese, pannus    Extremities: improved dependent edema  Neurologic: significant generalized muscle wasting  Skin: Prurigo lesions  Access: RIJ permcath 9/12 Dr. Wyn Quaker    Basic Metabolic Panel:  Recent Labs Lab 04/22/17 0845 04/24/17 0405 04/25/17 0657 04/26/17 1455  NA 139 139 136 137  K 3.2* 3.4* 3.4* 3.6  CL 101 103 101 101  CO2 GLUCOSE 90 85 83 80  BUN 21* 23* 29* 35*  CREATININE 2.74* 2.93* 3.50* 3.96*  CALCIUM 9.0 8.4* 8.6* 8.4*  PHOS  --  4.2  --  5.3*    Liver Function  Tests:  Recent Labs Lab 04/24/17 0405 04/26/17 1455  ALBUMIN 2.0* 2.0*   No results for input(s): LIPASE, AMYLASE in the last 168 hours. No results for input(s): AMMONIA in the last 168 hours.  CBC:  Recent Labs Lab 04/22/17 0845 04/24/17 0405 04/26/17 1455  WBC 19.5* 14.8* 14.4*  HGB 9.0* 9.3* 9.2*  HCT 28.4* 29.2* 29.6*  MCV 83.6 83.7 84.3  PLT 425 403 456*    Cardiac Enzymes: No results for input(s): CKTOTAL, CKMB, CKMBINDEX, TROPONINI in the last 168 hours.  BNP: Invalid input(s): POCBNP  CBG:  Recent Labs Lab 04/25/17 1122 04/25/17 1154  GLUCAP 77 80    Microbiology: Results for orders placed or performed during the hospital encounter of 03/30/17  Aerobic/Anaerobic Culture (surgical/deep wound)     Status: Abnormal   Collection Time: 03/30/17  7:56 PM  Result Value Ref Range Status   Specimen Description WOUND LEFT THIGH  Final   Special Requests NONE  Final   Gram Stain   Final    ABUNDANT WBC PRESENT, PREDOMINANTLY PMN ABUNDANT GRAM POSITIVE COCCI ABUNDANT GRAM NEGATIVE RODS MODERATE GRAM POSITIVE RODS MODERATE GRAM NEGATIVE COCCI    Culture (A)  Final    MULTIPLE ORGANISMS PRESENT, NONE PREDOMINANT NO ANAEROBES ISOLATED Performed at [facility], [address] [place], [place] [address]    Report Status 04/05/2017 FINAL  Final  MRSA PCR Screening     Status: Abnormal   Collection Time: 03/30/17  8:58 PM  Result Value Ref Range Status   MRSA by PCR POSITIVE (A) NEGATIVE Final    Comment:        The GeneXpert MRSA Assay (FDA approved for NASAL specimens only), is one component of a comprehensive MRSA colonization surveillance program. It is not intended to diagnose MRSA infection nor to guide or monitor treatment for MRSA infections. RESULT CALLED TO, READ BACK BY AND VERIFIED WITH: KARA CAMPBELL AT 2230 03/30/17.PMH   Body fluid culture     Status: None   Collection Time: 04/02/17 11:50 AM  Result Value Ref Range Status    Specimen Description PLEURAL  Final   Special Requests NONE  Final   Gram Stain NO WBC SEEN NO ORGANISMS SEEN   Final   Culture   Final    NO GROWTH 3 DAYS Performed at [facility], [address] [place], [place] [address]    Report Status 04/05/2017 FINAL  Final  Acid Fast Smear (AFB)     Status: None   Collection Time: 04/02/17 11:50 AM  Result Value Ref Range Status   AFB Specimen Processing Concentration  Final   Acid Fast Smear Negative  Final    Comment: (NOTE) Performed At: [facility] [address] [place], [place] [id number] Mila Homer MD XB:[id number]    Source (AFB) PLEURAL  Final    Coagulation Studies: No results for input(s): LABPROT, INR in the last 72 hours.  Urinalysis: No results for input(s): COLORURINE, LABSPEC, PHURINE, GLUCOSEU, HGBUR, BILIRUBINUR, KETONESUR, PROTEINUR, UROBILINOGEN, NITRITE, LEUKOCYTESUR in the last 72 hours.  Invalid input(s): APPERANCEUR    Imaging: No results found.   Medications:    . amLODipine  10 mg Oral Daily  . apixaban  5 mg Oral BID  . bisoprolol  10 mg Oral Daily  . buPROPion  100 mg Oral BID  . collagenase  1 application Topical Daily  . docusate sodium  100 mg Oral BID  . epoetin (EPOGEN/PROCRIT) injection  10,000 Units Intravenous Q M,W,F-HD  . ferrous sulfate  325 mg Oral Q1200  . irbesartan  300 mg Oral QHS  . levothyroxine  75 mcg Oral QAC breakfast  . multivitamin  1 tablet Oral QHS  . sertraline  50 mg Oral Daily   acetaminophen **OR** acetaminophen, HYDROcodone-acetaminophen, ondansetron **OR** ondansetron (ZOFRAN) IV  Assessment/ Plan:  [name] is a 36 y.o. white male with hypertension, morbid obesity, pulmonary hypertension, hypothyroidism, hyperlipidemia, depression/anxiety, coronary artery disease, congestive heart failure, who was admitted to [facility] on 03/30/2017  1. ESRD -24 urine cr clerance is 5cc/min - dialysis three times per week -  Patient was successfully  able to sit through 12/2 hours of treatment yesterday Dialyzing again in chair today, hopefully will be able to do more than 3 hours today.  2. Anasarca, with hypertension, proteinuria. Echocardiogram with normal systolic function 8/18. No visualization of hepatic cirrhosis on CT abd/pelvis Status post large volume paracentesis on 9/5 for 7 liters. Left thoracentesis on 8/27 - improving  3. Anemia of chronic kidney disease:  - trend is improving.    4. Generalized weakness with muscle wasting. No muscle biopsy performed.  Neurology and rheumatology evaluation performed. - holding statin    LOS: 6 [name] 9/14/201812:37 PM

## 2017-04-27 NOTE — Progress Notes (Signed)
tx complete 

## 2017-04-27 NOTE — Progress Notes (Signed)
PT Cancellation Note  Patient Details Name: [name] MRN: [id number] DOB: 02-25-1981   Cancelled Treatment:    Reason Eval/Treat Not Completed: Patient at procedure or test/unavailable (Treatment session attempted.  Patient currently off unit for dialysis.  Will re-attempt at later time/date as medically appropriate.)  Berenize Gatlin H. Manson Passey, PT, DPT, NCS 04/27/17, 11:39 AM [phone]

## 2017-04-27 NOTE — Progress Notes (Signed)
Pre dialysis assessment 

## 2017-04-28 MED ORDER — HEPARIN SODIUM (PORCINE) 1000 UNIT/ML IJ SOLN: 5.0000 mL | INTRAMUSCULAR | Status: DC

## 2017-04-28 MED ORDER — HEPARIN SODIUM (PORCINE) 1000 UNIT/ML IJ SOLN
5.0000 mL | INTRAMUSCULAR | Status: DC
  Administered 2017-04-30: 3000 [IU] via INTRAVENOUS
  Filled 2017-04-28 (×2): qty 5

## 2017-04-28 MED ORDER — NYSTATIN 100000 UNIT/GM EX POWD
Freq: Two times a day (BID) | CUTANEOUS | Status: DC
  Administered 2017-04-28 – 2017-05-02 (×8): via TOPICAL
  Filled 2017-04-28: qty 15

## 2017-04-28 NOTE — Progress Notes (Signed)
[employer] - [place] at [facility]   PATIENT NAME: [name]    MR#:  [id number]  DATE OF BIRTH:  [birth year]  SUBJECTIVE:   Patient still having some pain in his pressure ulcers on his lower extremities. Tolerated a full 3 and half our dialysis treatment sitting up yesterday.  Next Hemodialysis on Monday. Awaiting outpatient hemodialysis spot.  REVIEW OF SYSTEMS:    Review of Systems  Constitutional: Negative for chills and fever.  HENT: Negative for congestion and tinnitus.   Eyes: Negative for blurred vision and double vision.  Respiratory: Negative for cough, shortness of breath and wheezing.   Cardiovascular: Negative for chest pain, orthopnea and PND.  Gastrointestinal: Negative for abdominal pain, diarrhea, nausea and vomiting.  Genitourinary: Negative for dysuria and hematuria.  Musculoskeletal: Positive for joint pain (left leg).  Neurological: Positive for weakness. Negative for dizziness, sensory change and focal weakness.  All other systems reviewed and are negative.   Nutrition: Renal Tolerating Diet: Yes Tolerating PT: Eval noted.   DRUG ALLERGIES:  No Known Allergies  VITALS:  Blood pressure (!) 155/95, pulse 67, temperature 97.7 F (36.5 C), temperature source Oral, resp. rate 16, height  (1.753 m), weight 111.1 kg (245 lb), SpO2 94 %.  PHYSICAL EXAMINATION:   Physical Exam  GENERAL:  36 y.o.-year-old morbidly obese patient lying in bed in no acute distress.  EYES: Pupils equal, round, reactive to light and accommodation. No scleral icterus. Extraocular muscles intact.  HEENT: Head atraumatic, normocephalic. Oropharynx and nasopharynx clear.  NECK:  Supple, no jugular venous distention. No thyroid enlargement, no tenderness.  LUNGS: Normal breath sounds bilaterally, no wheezing, rales, rhonchi. No use of accessory muscles of respiration.  CARDIOVASCULAR: S1, S2 normal. No murmurs, rubs, or gallops.  ABDOMEN: Soft, nontender,  nondistended. Bowel sounds present. No organomegaly or mass.  EXTREMITIES: No cyanosis, clubbing or edema b/l.    NEUROLOGIC: Cranial nerves II through XII are intact. No focal Motor or sensory deficits b/l. Globally weak.    PSYCHIATRIC: The patient is alert and oriented x 3.  SKIN: No obvious rash, lesion, left lower extremity pressure ulcer on the left upper thigh and right posterior thigh.    Right chest wall perm dialysis cath in place.   LABORATORY PANEL:   CBC  Recent Labs Lab 04/26/17 1455  WBC 14.4*  HGB 9.2*  HCT 29.6*  PLT 456*   ------------------------------------------------------------------------------------------------------------------  Chemistries   Recent Labs Lab 04/26/17 1455  NA 137  K 3.6  CL 101  CO2 29  GLUCOSE 80  BUN 35*  CREATININE 3.96*  CALCIUM 8.4*   ------------------------------------------------------------------------------------------------------------------  Cardiac Enzymes No results for input(s): TROPONINI in the last 168 hours. ------------------------------------------------------------------------------------------------------------------  RADIOLOGY:  No results found.   ASSESSMENT AND PLAN:   Travaris Kosh  is a 36 y.o. male with a known history of CKD with nephrotic range proteinuria, diastolic CHF, chronic lower extremity edema with ulcerations, pulmonary hypertension, generalized anxiety disorder and depression, hypertension who was recently admitted to [facility] from 03/30/2017 and was transferred to [facility] on 04/16/2017 for rheumatology evaluation and consideration of a muscle biopsy is being sent back for further management.  #1 acute renal failure on chronic kidney disease stage 3-appreciate nephrology consult and pt. Has nephrotic range proteinuria and etiology remains unclear.  - Renal ultrasound showing no hydronephrosis but worsening renal atrophy. - Kidney biopsy was attempted at [facility], however unable to be  done due to his body habitus and positioning. - now  ESRD as 24 Cr. Clearance was < 5.  S/p [facility] cath and cont. Dialysis as per Nephrology.  - needs outpatient spot for HD as pt is ESRD now.   Tolerated his 3.5 hr HD yesterday sitting up.    #2 acute hypoxic respiratory failure-secondary to diastolic CHF and pleural effusion. - was on Bipap and now weaned and cont. [place] O2 for now. Denies any shortness of breath.  - s/p thoracentesis in the past and now doing well.   #3 anasarca- s/p thoracentesis and also Paracentesis.  - cont. Dialysis for fluid removal and watch volume status which is stable.   #4 significant muscle weakness - seen by Neurology and rheumatology at [facility]. They did not consider muscle biopsy as they believe that the weakness is more from deconditioning and malnutrition rather than any inflammatory myositis. CK's, Aldolase have been normal.   #5 hypertension- cont. 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 Eliquis.   #7 acute on chronic anemia-received transfusion prior to transfer to [facility]. Hg. Presently Stable and no need for transfusion. - cont. Epogen with dialysis and IV iron if needed with dialysis.  #8 LLE pressure ulcer - on left upper thigh. Cont. Local wound care.  Awaiting outpatient HD spot which is unlikely to happen until next week.   All the records are reviewed and case discussed with Care Management/Social Worker. Management plans discussed with the patient, family and they are in agreement.  CODE STATUS: Full code  DVT Prophylaxis: Eliquis  TOTAL TIME TAKING CARE OF THIS PATIENT: 25 minutes.   POSSIBLE D/C when pt. Has an outpatient spot for HD.   Houston Siren M.D on 04/28/2017 at 1:00 PM  Between 7am to 6pm - Pager - [phone]  After 6pm go to [email or web address] - Social research officer, government  [facility] [place] [facility]  Office  [phone]  CC: Primary care physician;  Patient, No Pcp Per

## 2017-04-28 NOTE — Progress Notes (Signed)
Patient refusing to get up to chair despite multiple attempts to do so by nursing staff.  Patient states chair is too uncomfortable and he doesn't fit well.  Bariatric recliner ordered.

## 2017-04-28 NOTE — Progress Notes (Signed)
[facility]  ROUNDING NOTE   Subjective:    Tolerated dialysis well yesterday for 3.5 hrs in chair Denies nausea or vomiting  Appetite is poor Foul smelling thigh wound  Objective:  Vital signs in last 24 hours:  Temp:  [97.5 F (36.4 C)-98.3 F (36.8 C)] 97.7 F (36.5 C) (09/15 0430) Pulse Rate:  [65-73] 67 (09/15 0852) Resp:  [13-18] 16 (09/15 0852) BP: (143-160)/(85-109) 155/95 (09/15 0852) SpO2:  [94 %-100 %] 94 % (09/15 0430)  Weight change:  Filed Weights   04/21/17 1748 04/25/17 1017  Weight: 111.1 kg (245 lb) 111.1 kg (245 lb)    Intake/Output: I/O last 3 completed shifts: In: 240 [P.O.:240] Out: 1827 [Urine:325; Other:1500; Stool:2]   Intake/Output this shift:  Total I/O In: -  Out: 300 [Urine:300]  Physical Exam: General: Chronically ill-appearing,   Head: +temporal wasting  Eyes: Anicteric  Neck: No venous distention  Lungs:  Clear to auscultation bilaterally, Normal effort   Heart: S1S2 no rubs  Abdomen:  Soft, nontender, obese, pannus    Extremities: improved dependent edema, not resolved  Neurologic: significant generalized muscle wasting  Skin: Prurigo lesions, rt thigh wound with foul smell  Access: RIJ permcath 9/12 Dr. Wyn [name]    Basic Metabolic Panel:  Recent Labs [name] 04/22/17 0845 04/24/17 0405 04/25/17 0657 04/26/17 1455  NA 139 139 136 137  K 3.2* 3.4* 3.4* 3.6  CL 101 103 101 101  CO2 GLUCOSE 90 85 83 80  BUN 21* 23* 29* 35*  CREATININE 2.74* 2.93* 3.50* 3.96*  CALCIUM 9.0 8.4* 8.6* 8.4*  PHOS  --  4.2  --  5.3*    Liver Function Tests:  Recent Labs [name] 04/24/17 0405 04/26/17 1455  ALBUMIN 2.0* 2.0*   No results for input(s): LIPASE, AMYLASE in the last 168 hours. No results for input(s): AMMONIA in the last 168 hours.  CBC:  Recent Labs [name] 04/22/17 0845 04/24/17 0405 04/26/17 1455  WBC 19.5* 14.8* 14.4*  HGB 9.0* 9.3* 9.2*  HCT 28.4* 29.2* 29.6*  MCV 83.6 83.7 84.3  PLT 425 403  456*    Cardiac Enzymes: No results for input(s): CKTOTAL, CKMB, CKMBINDEX, TROPONINI in the last 168 hours.  BNP: Invalid input(s): POCBNP  CBG:  Recent Labs [name] 04/25/17 1122 04/25/17 1154  GLUCAP 77 80    Microbiology: Results for orders placed or performed during the hospital encounter of 03/30/17  Aerobic/Anaerobic Culture (surgical/deep wound)     Status: Abnormal   Collection Time: 03/30/17  7:56 PM  Result Value Ref Range Status   Specimen Description WOUND LEFT THIGH  Final   Special Requests NONE  Final   Gram Stain   Final    ABUNDANT WBC PRESENT, PREDOMINANTLY PMN ABUNDANT GRAM POSITIVE COCCI ABUNDANT GRAM NEGATIVE RODS MODERATE GRAM POSITIVE RODS MODERATE GRAM NEGATIVE COCCI    Culture (A)  Final    MULTIPLE ORGANISMS PRESENT, NONE PREDOMINANT NO ANAEROBES ISOLATED Performed at [name], [address], [name], [place] [address]    Report Status 04/05/2017 FINAL  Final  MRSA PCR Screening     Status: Abnormal   Collection Time: 03/30/17  8:58 PM  Result Value Ref Range Status   MRSA by PCR POSITIVE (A) NEGATIVE Final    Comment:        The GeneXpert MRSA Assay (FDA approved for NASAL specimens only), is one component of a comprehensive MRSA colonization surveillance program. It is not intended to  diagnose MRSA infection nor to guide or monitor treatment for MRSA infections. RESULT CALLED TO, READ BACK BY AND VERIFIED WITH: [name] AT 2230 03/30/17.PMH   Body fluid culture     Status: None   Collection Time: 04/02/17 11:50 AM  Result Value Ref Range Status   Specimen Description PLEURAL  Final   Special Requests NONE  Final   Gram Stain NO WBC SEEN NO ORGANISMS SEEN   Final   Culture   Final    NO GROWTH 3 DAYS Performed at [name], [address] [name], [place] [address]    Report Status 04/05/2017 FINAL  Final  Acid Fast Smear (AFB)     Status: None   Collection Time: 04/02/17 11:50 AM  Result Value Ref  Range Status   AFB Specimen Processing Concentration  Final   Acid Fast Smear Negative  Final    Comment: (NOTE) Performed At: [name] [address] [place], [place] [id number] Brian Homer MD WU:[id number]    Source (AFB) PLEURAL  Final    Coagulation Studies: No results for input(s): LABPROT, INR in the last 72 hours.  Urinalysis: No results for input(s): COLORURINE, LABSPEC, PHURINE, GLUCOSEU, HGBUR, BILIRUBINUR, KETONESUR, PROTEINUR, UROBILINOGEN, NITRITE, LEUKOCYTESUR in the last 72 hours.  Invalid input(s): APPERANCEUR    Imaging: No results found.   Medications:    . amLODipine  10 mg Oral Daily  . apixaban  5 mg Oral BID  . bisoprolol  10 mg Oral Daily  . buPROPion  100 mg Oral BID  . collagenase  1 application Topical Daily  . docusate sodium  100 mg Oral BID  . epoetin (EPOGEN/PROCRIT) injection  10,000 Units Intravenous Q M,W,F-HD  . ferrous sulfate  325 mg Oral Q1200  . irbesartan  300 mg Oral QHS  . levothyroxine  75 mcg Oral QAC breakfast  . multivitamin  1 tablet Oral QHS  . sertraline  50 mg Oral Daily   acetaminophen **OR** acetaminophen, HYDROcodone-acetaminophen, morphine injection, ondansetron **OR** ondansetron (ZOFRAN) IV  Assessment/ Plan:  [name] is a 36 y.o. white male with hypertension, morbid obesity, pulmonary hypertension, hypothyroidism, hyperlipidemia, depression/anxiety, coronary artery disease, congestive heart failure, who was admitted to [facility] on 03/30/2017  1. ESRD -24 urine cr clerance is 5cc/min - dialysis three times per week, MWF for now -  Patient was successfully able to sit through  3.5 hours of treatment yesterday - Next HD planned for Monday  2. Anasarca, with hypertension, proteinuria. Echocardiogram with normal systolic function 8/18. No visualization of hepatic cirrhosis on CT abd/pelvis Status post large volume paracentesis on 9/5 for 7 liters. Left thoracentesis on 8/27 - improving  3.  Anemia of chronic kidney disease:  - acceptable trend.    4. Generalized weakness with muscle wasting. No muscle biopsy performed.  Neurology and rheumatology evaluation performed. - holding statin - PT / OT    LOS: 7 [name] 9/15/201812:27 PM

## 2017-04-28 NOTE — Progress Notes (Signed)
Physical Therapy Treatment Patient Details Name: [name] MRN: [id number] DOB: 1980-09-03 Today's Date: 04/28/2017    History of Present Illness Pt is a 36 y.o. M who presented to the ED from [facility] with SOB, renal failure, multiple skin wounds, and swelling in B LE. Pt admitted 03/30/2017 with acute on chronic renal failure, acute on chronic respiratory failure with hypoxia, and B pulmonary effusion. Pt s/p R IJ temporary HD catheter placement, s/p debridement of a L thigh wound, and s/p L thoracentesis. Hospital stay complicated by transfer to CCU (now returned to floor) for hypercarbic respiratory failure requiring BiPAP; additionally significant for L thoracentesis (8/27) with removal of 2L and paracentesis (8/29) with removal of 5L.  Currently on 4L supplemental O2 via [place].   Recently transferred to outside hospital for consideration of muscle biopsy; unable to complete biopsy, but did undergo paracentesis (5+7L removed) and L thoracentesis.  Returned to [facility] for continued care.  PMH: occassional tremors, B lymphedema, obesity, CKD, R cardiac cath (2016), HLD, HTN, major depression, diastolic CHF, primary pulmonary HTN, hypothyroidism, and new dialysis patient.    PT Comments    Participated in exercises as described below.  Pt reported transfers going well with Viking lift.  Pt stated he as been out of bed x 2 with nursing and tolerating it well.  Declined out of bed at this time, awaiting breakfast.  Encouraged him to request out of bed later today.  Pt agreed.    Follow Up Recommendations  SNF     Equipment Recommendations       Recommendations for Other Services       Precautions / Restrictions Precautions Precautions: Fall;Other (comment) Precaution Comments: R IJ temporary HD catheter removed (9/10) Restrictions Weight Bearing Restrictions: No    Mobility  Bed Mobility                  Transfers                    Ambulation/Gait                  Stairs            Wheelchair Mobility    Modified Rankin (Stroke Patients Only)       Balance                                            Cognition Arousal/Alertness: Awake/alert Behavior During Therapy: WFL for tasks assessed/performed Overall Cognitive Status: Within Functional Limits for tasks assessed                                        Exercises Other Exercises Other Exercises: BLE AAROM ank;e pumps, heel slides, SLR, SAQ ab/add    General Comments        Pertinent Vitals/Pain Pain Assessment: 0-10 Pain Score: 5  Pain Location: L thigh (Wound) Pain Descriptors / Indicators: Discomfort;Pressure;Sore;Tender Pain Intervention(s): Limited activity within patient's tolerance;Patient requesting pain meds-RN notified    Home Living                      Prior Function            PT Goals (current goals can now be found in the care plan  section) Progress towards PT goals: Progressing toward goals    Frequency    Min 2X/week      PT Plan Current plan remains appropriate    Co-evaluation              AM-PAC PT "6 Clicks" Daily Activity  Outcome Measure  Difficulty turning over in bed (including adjusting bedclothes, sheets and blankets)?: Unable Difficulty moving from lying on back to sitting on the side of the bed? : Unable Difficulty sitting down on and standing up from a chair with arms (e.g., wheelchair, bedside commode, etc,.)?: Unable Help needed moving to and from a bed to chair (including a wheelchair)?: Total Help needed walking in hospital room?: Total Help needed climbing 3-5 steps with a railing? : Total 6 Click Score: 6    End of Session Equipment Utilized During Treatment: Oxygen Activity Tolerance: Patient tolerated treatment well Patient left: in bed;with call bell/phone within reach;with bed alarm set   Pain - Right/Left: Left Pain - part of body: Leg      Time: 0822-0830 PT Time Calculation (min) (ACUTE ONLY): 8 min  Charges:  $Therapeutic Exercise: 8-22 mins                    G Codes:       Danielle Dess, PTA 04/28/17, 8:50 AM

## 2017-04-29 LAB — CBC
HCT: 30.9 % — ABNORMAL LOW (ref 40.0–52.0)
HEMOGLOBIN: 9.8 g/dL — AB (ref 13.0–18.0)
MCH: 27.3 pg (ref 26.0–34.0)
MCHC: 31.7 g/dL — ABNORMAL LOW (ref 32.0–36.0)
MCV: 86.1 fL (ref 80.0–100.0)
Platelets: 413 10*3/uL (ref 150–440)
RBC: 3.59 MIL/uL — AB (ref 4.40–5.90)
RDW: 18.4 % — ABNORMAL HIGH (ref 11.5–14.5)
WBC: 13.1 10*3/uL — AB (ref 3.8–10.6)

## 2017-04-29 LAB — CREATININE, SERUM
CREATININE: 3.56 mg/dL — AB (ref 0.61–1.24)
GFR, EST AFRICAN AMERICAN: 24 mL/min — AB (ref >60)
GFR, EST NON AFRICAN AMERICAN: 21 mL/min — AB (ref >60)

## 2017-04-29 MED ORDER — TORSEMIDE 20 MG PO TABS
40.0000 mg | ORAL_TABLET | Freq: Every day | ORAL | Status: DC
  Administered 2017-04-29 – 2017-05-02 (×4): 40 mg via ORAL
  Filled 2017-04-29 (×4): qty 2

## 2017-04-29 MED ORDER — MORPHINE SULFATE (PF) 2 MG/ML IV SOLN
2.0000 mg | INTRAVENOUS | Status: DC | PRN
  Administered 2017-04-29 – 2017-05-02 (×13): 2 mg via INTRAVENOUS
  Filled 2017-04-29 (×14): qty 1

## 2017-04-29 NOTE — Progress Notes (Signed)
[facility] [place] [facility]   PATIENT NAME: [name]    MR#:  [id number]  DATE OF BIRTH:  1981-02-07  SUBJECTIVE:   Still complaining of pain at his pressure ulcers site.  Tolerated his HD sitting up in chair on Friday. Next HD tomorrow.   REVIEW OF SYSTEMS:    Review of Systems  Constitutional: Negative for chills and fever.  HENT: Negative for congestion and tinnitus.   Eyes: Negative for blurred vision and double vision.  Respiratory: Negative for cough, shortness of breath and wheezing.   Cardiovascular: Negative for chest pain, orthopnea and PND.  Gastrointestinal: Negative for abdominal pain, diarrhea, nausea and vomiting.  Genitourinary: Negative for dysuria and hematuria.  Musculoskeletal: Positive for joint pain (left leg).  Neurological: Positive for weakness. Negative for dizziness, sensory change and focal weakness.  All other systems reviewed and are negative.   Nutrition: Renal Tolerating Diet: Yes Tolerating PT: Eval noted.   DRUG ALLERGIES:  No Known Allergies  VITALS:  Blood pressure (!) 156/95, pulse 67, temperature 98.7 F (37.1 C), resp. rate 20, height  (1.753 m), weight 111.1 kg (245 lb), SpO2 98 %.  PHYSICAL EXAMINATION:   Physical Exam  GENERAL:  36 y.o.-year-old morbidly obese patient lying in bed in no acute distress.  EYES: Pupils equal, round, reactive to light and accommodation. No scleral icterus. Extraocular muscles intact.  HEENT: Head atraumatic, normocephalic. Oropharynx and nasopharynx clear.  NECK:  Supple, no jugular venous distention. No thyroid enlargement, no tenderness.  LUNGS: Normal breath sounds bilaterally, no wheezing, rales, rhonchi. No use of accessory muscles of respiration.  CARDIOVASCULAR: S1, S2 normal. No murmurs, rubs, or gallops.  ABDOMEN: Soft, nontender, nondistended. Bowel sounds present. No organomegaly or mass.  EXTREMITIES: No cyanosis, clubbing or edema b/l.    NEUROLOGIC:  Cranial nerves II through XII are intact. No focal Motor or sensory deficits b/l. Globally weak.    PSYCHIATRIC: The patient is alert and oriented x 3.  SKIN: No obvious rash, lesion, left lower extremity pressure ulcer on the left upper thigh and right posterior thigh.    Right chest wall perm dialysis cath in place.   LABORATORY PANEL:   CBC  Recent Labs Lab 04/29/17 0446  WBC 13.1*  HGB 9.8*  HCT 30.9*  PLT 413   ------------------------------------------------------------------------------------------------------------------  Chemistries   Recent Labs Lab 04/26/17 1455 04/29/17 0446  NA 137  --   K 3.6  --   CL 101  --   CO2 29  --   GLUCOSE 80  --   BUN 35*  --   CREATININE 3.96* 3.56*  CALCIUM 8.4*  --    ------------------------------------------------------------------------------------------------------------------  Cardiac Enzymes No results for input(s): TROPONINI in the last 168 hours. ------------------------------------------------------------------------------------------------------------------  RADIOLOGY:  No results found.   ASSESSMENT AND PLAN:   [name]  is a 36 y.o. male with a known history of CKD with nephrotic range proteinuria, diastolic CHF, chronic lower extremity edema with ulcerations, pulmonary hypertension, generalized anxiety disorder and depression, hypertension who was recently admitted to [facility] from 03/30/2017 and was transferred to [facility] on 04/16/2017 for rheumatology evaluation and consideration of a muscle biopsy is being sent back for further management.  #1 acute renal failure on chronic kidney disease stage 3-appreciate nephrology consult and pt. Has nephrotic range proteinuria and etiology remains unclear.  - Renal ultrasound showing no hydronephrosis but worsening renal atrophy. - Kidney biopsy was attempted at [facility], however unable to be done  due to his body habitus and positioning. - now ESRD as 24 Cr.  Clearance was < 5.  S/p [facility] cath and cont. Dialysis as per Nephrology.  - needs outpatient spot for HD as pt is ESRD now.   Tolerated his 3.5 hr HD sitting up on Friday.   #2 acute hypoxic respiratory failure-secondary to diastolic CHF and pleural effusion. - was on Bipap and now weaned and cont.  O2 for now. Denies any shortness of breath.  - s/p thoracentesis in the past and now doing well.   #3 anasarca- s/p thoracentesis and also Paracentesis.  - cont. Dialysis for fluid removal and watch volume status which is stable.   #4 significant muscle weakness - seen by Neurology and rheumatology at [facility]. They did not consider muscle biopsy as they believe that the weakness is more from deconditioning and malnutrition rather than any inflammatory myositis. CK's, Aldolase have been normal.   #5 hypertension- cont. 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 Eliquis.   #7 acute on chronic anemia-received transfusion prior to transfer to [facility]. Hg. Presently Stable and no need for transfusion. - cont. Epogen with dialysis and IV iron if needed with dialysis.  #8 LLE pressure ulcer - on left upper thigh. Cont. Local wound care and pain control with IV Morphine, oxycodone.  Awaiting outpatient HD spot   All the records are reviewed and case discussed with Care Management/Social Worker. Management plans discussed with the patient, family and they are in agreement.  CODE STATUS: Full code  DVT Prophylaxis: Eliquis  TOTAL TIME TAKING CARE OF THIS PATIENT: 25 minutes.   POSSIBLE D/C when pt. Has an outpatient spot for HD.   Houston Siren M.D on 04/29/2017 at 11:38 AM  Between 7am to 6pm - Pager - [phone]  After 6pm go to [email or web address] - Social research officer, government  [facility]  Office  [phone]  CC: Primary care physician; Patient, No Pcp Per

## 2017-04-29 NOTE — Progress Notes (Signed)
[facility]  ROUNDING NOTE   Subjective:    Tolerated dialysis well on Friday for 3.5 hrs in chair Denies nausea or vomiting  Appetite is poor Foul smelling thigh wound Reports painful knots in thighs  Objective:  Vital signs in last 24 hours:  Temp:  [98.1 F (36.7 C)-98.7 F (37.1 C)] 98.7 F (37.1 C) (09/16 0339) Pulse Rate:  [63-70] 67 (09/16 0339) Resp:  [20] 20 (09/16 0339) BP: (146-161)/(89-98) 156/95 (09/16 0339) SpO2:  [98 %-100 %] 98 % (09/16 0339)  Weight change:  Filed Weights   04/21/17 1748 04/25/17 1017  Weight: 111.1 kg (245 lb) 111.1 kg (245 lb)    Intake/Output: I/O last 3 completed shifts: In: 360 [P.O.:360] Out: 652 [Urine:650; Stool:2]   Intake/Output this shift:  No intake/output data recorded.  Physical Exam: General: Chronically ill-appearing,   Head: +temporal wasting  Eyes: Anicteric  Neck: No venous distention  Lungs:  Clear to auscultation bilaterally, Normal effort   Heart: S1S2 no rubs  Abdomen:  Soft, nontender, obese, pannus    Extremities: improved dependent edema especially upper thighs, not resolved  Neurologic: significant generalized muscle wasting  Skin: Prurigo lesions, rt thigh wound with foul smell, necrotic lesions on thighs  Access: RIJ permcath 9/12 Dr. Wyn Quaker    Basic Metabolic Panel:  Recent Labs Lab 04/24/17 0405 04/25/17 0657 04/26/17 1455 04/29/17 0446  NA 139 136 137  --   K 3.4* 3.4* 3.6  --   CL 103 101 101  --   CO2 --   GLUCOSE 85 83 80  --   BUN 23* 29* 35*  --   CREATININE 2.93* 3.50* 3.96* 3.56*  CALCIUM 8.4* 8.6* 8.4*  --   PHOS 4.2  --  5.3*  --     Liver Function Tests:  Recent Labs Lab 04/24/17 0405 04/26/17 1455  ALBUMIN 2.0* 2.0*   No results for input(s): LIPASE, AMYLASE in the last 168 hours. No results for input(s): AMMONIA in the last 168 hours.  CBC:  Recent Labs Lab 04/24/17 0405 04/26/17 1455 04/29/17 0446  WBC 14.8* 14.4* 13.1*  HGB 9.3*  9.2* 9.8*  HCT 29.2* 29.6* 30.9*  MCV 83.7 84.3 86.1  PLT 403 456* 413    Cardiac Enzymes: No results for input(s): CKTOTAL, CKMB, CKMBINDEX, TROPONINI in the last 168 hours.  BNP: Invalid input(s): POCBNP  CBG:  Recent Labs Lab 04/25/17 1122 04/25/17 1154  GLUCAP 77 80    Microbiology: Results for orders placed or performed during the hospital encounter of 03/30/17  Aerobic/Anaerobic Culture (surgical/deep wound)     Status: Abnormal   Collection Time: 03/30/17  7:56 PM  Result Value Ref Range Status   Specimen Description WOUND LEFT THIGH  Final   Special Requests NONE  Final   Gram Stain   Final    ABUNDANT WBC PRESENT, PREDOMINANTLY PMN ABUNDANT GRAM POSITIVE COCCI ABUNDANT GRAM NEGATIVE RODS MODERATE GRAM POSITIVE RODS MODERATE GRAM NEGATIVE COCCI    Culture (A)  Final    MULTIPLE ORGANISMS PRESENT, NONE PREDOMINANT NO ANAEROBES ISOLATED Performed at [facility], [address], [place], [place] [address]    Report Status 04/05/2017 FINAL  Final  MRSA PCR Screening     Status: Abnormal   Collection Time: 03/30/17  8:58 PM  Result Value Ref Range Status   MRSA by PCR POSITIVE (A) NEGATIVE Final    Comment:        The GeneXpert MRSA  Assay (FDA approved for NASAL specimens only), is one component of a comprehensive MRSA colonization surveillance program. It is not intended to diagnose MRSA infection nor to guide or monitor treatment for MRSA infections. RESULT CALLED TO, READ BACK BY AND VERIFIED WITH: KARA CAMPBELL AT 2230 03/30/17.PMH   Body fluid culture     Status: None   Collection Time: 04/02/17 11:50 AM  Result Value Ref Range Status   Specimen Description PLEURAL  Final   Special Requests NONE  Final   Gram Stain NO WBC SEEN NO ORGANISMS SEEN   Final   Culture   Final    NO GROWTH 3 DAYS Performed at [facility], [address], [place], [place] [address]    Report Status 04/05/2017 FINAL  Final  Acid Fast Smear (AFB)      Status: None   Collection Time: 04/02/17 11:50 AM  Result Value Ref Range Status   AFB Specimen Processing Concentration  Final   Acid Fast Smear Negative  Final    Comment: (NOTE) Performed At: [facility] [address] [place], [place] [id number] Mila Homer MD NW:[id number]    Source (AFB) PLEURAL  Final    Coagulation Studies: No results for input(s): LABPROT, INR in the last 72 hours.  Urinalysis: No results for input(s): COLORURINE, LABSPEC, PHURINE, GLUCOSEU, HGBUR, BILIRUBINUR, KETONESUR, PROTEINUR, UROBILINOGEN, NITRITE, LEUKOCYTESUR in the last 72 hours.  Invalid input(s): APPERANCEUR    Imaging: No results found.   Medications:    . amLODipine  10 mg Oral Daily  . apixaban  5 mg Oral BID  . bisoprolol  10 mg Oral Daily  . buPROPion  100 mg Oral BID  . collagenase  1 application Topical Daily  . docusate sodium  100 mg Oral BID  . epoetin (EPOGEN/PROCRIT) injection  10,000 Units Intravenous Q M,W,F-HD  . ferrous sulfate  325 mg Oral Q1200  . [START ON 04/30/2017] heparin  5 mL Intravenous Once per day on Mon Wed Fri  . irbesartan  300 mg Oral QHS  . levothyroxine  75 mcg Oral QAC breakfast  . multivitamin  1 tablet Oral QHS  . nystatin   Topical BID  . sertraline  50 mg Oral Daily   acetaminophen **OR** acetaminophen, HYDROcodone-acetaminophen, morphine injection, ondansetron **OR** ondansetron (ZOFRAN) IV  Assessment/ Plan:  Mr. Kadden Osterhout is a 36 y.o. white male with hypertension, morbid obesity, pulmonary hypertension, hypothyroidism, hyperlipidemia, depression/anxiety, coronary artery disease, congestive heart failure, who was admitted to [facility] on 03/30/2017  1. ESRD. (Initial HD on 04/02/17) -24 urine cr clearance is 5cc/min - dialysis three times per week, MWF for now -  Patient was successfully able to sit through  3.5 hours of treatment on Friday - Next HD planned for Monday in chair - Outpatient d/c planning  2. Anasarca, with  hypertension, proteinuria. Echocardiogram with normal systolic function 8/18. No visualization of hepatic cirrhosis on CT abd/pelvis Status post large volume paracentesis on 9/5 for 7 liters. Left thoracentesis on 8/27 - improving - start diuretic- Torsemide  3. Anemia of chronic kidney disease:  - acceptable trend.    4. Generalized weakness with muscle wasting. No muscle biopsy performed.  Neurology and rheumatology evaluation performed. - holding statin - PT / OT - sat in chair for HD for 3.5 hrs    LOS: 8 [name] 9/16/201812:50 PM

## 2017-04-29 NOTE — Progress Notes (Signed)
Took patient off of Bipap and placed on 4l nasal cannula for the day.

## 2017-04-30 DIAGNOSIS — E43 Unspecified severe protein-calorie malnutrition: Secondary | ICD-10-CM | POA: Insufficient documentation

## 2017-04-30 MED ORDER — EPOETIN ALFA 10000 UNIT/ML IJ SOLN
10000.0000 [IU] | Freq: Once | INTRAMUSCULAR | Status: AC
  Administered 2017-05-01: 10000 [IU] via SUBCUTANEOUS

## 2017-04-30 MED ORDER — NEPRO/CARBSTEADY PO LIQD
237.0000 mL | Freq: Three times a day (TID) | ORAL | Status: DC
  Administered 2017-05-01 – 2017-05-02 (×4): 237 mL via ORAL

## 2017-04-30 NOTE — Progress Notes (Addendum)
Initial Nutrition Assessment  DOCUMENTATION CODES:   Severe malnutrition in context of chronic illness  INTERVENTION:   Pt may benefit from PEG tube placement to help him meet his estimated needs, prevent furthur lean muscle loss, and promote wound healing. Pt eating 75-100% of meals and drinking 2 Nepro per day on his last admit and was only meeting 75% of his estimated protein needs and 85% of his estimated calorie needs.   Nepro Shake po TID, each supplement provides 425 kcal and 19 grams protein  Renal MVI  Liberalize diet   NUTRITION DIAGNOSIS:   Malnutrition (severe) related to chronic illness (ESRD on HD), CHF as evidenced by severe depletion of body fat, severe depletion of muscle mass.  GOAL:   Patient will meet greater than or equal to 90% of their needs  MONITOR:   PO intake, Supplement acceptance, Labs, I & O's, Weight trends  REASON FOR ASSESSMENT:   Malnutrition Screening Tool    ASSESSMENT:   36 y.o. male with a known history of CK D with nephrotic range proteinuria, diastolic CHF, chronic lower extremity edema with ulcerations, pulmonary hypertension, generalized anxiety disorder and depression, hypertension who was recently admitted to [facility] from 03/30/2017 and was transferred to [facility] on 04/16/2017 for rheumatology evaluation and consideration of a muscle biopsy is being sent back for further management.   Seen by Neurology and Rheumatology at [facility]. They did not consider muscle biopsy as they believe that the weakness is more from deconditioning and malnutrition rather than any inflammatory myositis  Kidney biopsy was attempted at [facility], however unable to be done due to his body habitus and positioning.  RIJ permcath 9/12 Dr. Lucky Cowboy  Pt with ESRD on HD three times per week (Initial HD on 04/02/17) -24 urine cr clearance is 5cc/min  Status post large volume paracentesis on 9/5 for 7 liters. Left thoracentesis on 8/27 for 2 Liters  Pt with 107lb wt  loss from 8/25 (333lbs) to today (226lbs). Anasarca improved per pt report. It is difficult to determine how much actual lean muscle and fat loss pt has had r/t severe edema but pt with severe muscle and fat depletions.   Met with pt in room today. Pt with poor appetite and oral intake pta; pt reports that his appetite has improved but is still poor. Pt eating <25% of meals in hospital. Pt reports that all he wants to eat is a potato but that he can't have this as he is on a renal/carb modified diet. Spoke to Dr. Leslye Peer and Dr. Holley Raring; will liberalize diet today (Dr. Zollie Scale requests heart healthy).   Pt may benefit from PEG tube placement to help him meet his estimated needs and prevent lean muscle loss and promote wound healing. Pt eating 75-100% of meals and drinking 2 Nepro per day on last admit and was only meeting 75% of his estimated protein needs and 85% of his calories needs.   Medications reviewed and include: colace, epogen, ferrous sulfate, heparin, synthroid, MVI, hydrocodone-acetaminophen, morphine  Labs reviewed: BUN 35(H), creat 3.56(H), Ca 8.4(L) adj. 10.0 wnl, P 5.3(H), alb 2.0(L) Wbc- 13.1(H), Hgb 9.8(L), Hct 30.9(L) Prealbumin 9.1(L)- 8/19  Nutrition-Focused physical exam completed. Findings are severe fat and muscle depletions over entire body, and moderate generalized edema. Pt is significantly pale.   Diet Order:  Diet 2 gram sodium Room service appropriate? Yes; Fluid consistency: Thin  Skin:    Prurigo lesions, rt thigh wound with foul small, necrotic lesions on thighs  Last BM:  9/15  Height:   Ht Readings from Last 1 Encounters:  04/25/17 5' 9"  (1.753 m)    Weight:   Wt Readings from Last 1 Encounters:  04/30/17 226 lb (102.5 kg)    Ideal Body Weight:  72.7 kg  BMI:  Body mass index is 33.37 kg/m.  Estimated Nutritional Needs:   Kcal:  2250-2550kcal/day (30-35kcal/day IBW)  Protein:  130-145g/day (1.8-2g/kg IBW)  Fluid:   per MD  EDUCATION NEEDS:    Education needs addressed  Koleen Distance MS, RD, LDN Pager #[phone] After Hours Pager: [phone]

## 2017-04-30 NOTE — Progress Notes (Signed)
[facility]  ROUNDING NOTE   Subjective:  Patient seen and evaluated during hemodialysis. Tolerating well. Outpatient dialysis placement pending.  Objective:  Vital signs in last 24 hours:  Temp:  [97.7 F (36.5 C)-98.4 F (36.9 C)] 97.7 F (36.5 C) (09/17 1145) Pulse Rate:  [65-78] 70 (09/17 1330) Resp:  [15-22] 22 (09/17 1330) BP: (114-159)/(90-103) 114/90 (09/17 1330) SpO2:  [99 %-100 %] 99 % (09/17 0418) Weight:  [102.5 kg (226 lb)] 102.5 kg (226 lb) (09/17 1119)  Weight change:  Filed Weights   04/21/17 1748 04/25/17 1017 04/30/17 1119  Weight: 111.1 kg (245 lb) 111.1 kg (245 lb) 102.5 kg (226 lb)    Intake/Output: I/O last 3 completed shifts: In: 120 [P.O.:120] Out: 725 [Urine:725]   Intake/Output this shift:  Total I/O In: -  Out: 275 [Urine:275]  Physical Exam: General: Chronically ill-appearing  Head: +temporal wasting  Eyes: Anicteric  Neck: No venous distention  Lungs:  Clear to auscultation bilaterally, Normal effort   Heart: S1S2 no rubs  Abdomen:  Soft, nontender, obese, pannus    Extremities: Trace LE edema  Neurologic: significant generalized muscle wasting  Skin: Prurigo lesions, rt thigh wound with foul smell, necrotic lesions on thighs  Access: RIJ permcath 9/12 Dr. Wyn Quaker    Basic Metabolic Panel:  Recent Labs Lab 04/24/17 0405 04/25/17 0657 04/26/17 1455 04/29/17 0446  NA 139 136 137  --   K 3.4* 3.4* 3.6  --   CL 103 101 101  --   CO2 --   GLUCOSE 85 83 80  --   BUN 23* 29* 35*  --   CREATININE 2.93* 3.50* 3.96* 3.56*  CALCIUM 8.4* 8.6* 8.4*  --   PHOS 4.2  --  5.3*  --     Liver Function Tests:  Recent Labs Lab 04/24/17 0405 04/26/17 1455  ALBUMIN 2.0* 2.0*   No results for input(s): LIPASE, AMYLASE in the last 168 hours. No results for input(s): AMMONIA in the last 168 hours.  CBC:  Recent Labs Lab 04/24/17 0405 04/26/17 1455 04/29/17 0446  WBC 14.8* 14.4* 13.1*  HGB 9.3* 9.2* 9.8*   HCT 29.2* 29.6* 30.9*  MCV 83.7 84.3 86.1  PLT 403 456* 413    Cardiac Enzymes: No results for input(s): CKTOTAL, CKMB, CKMBINDEX, TROPONINI in the last 168 hours.  BNP: Invalid input(s): POCBNP  CBG:  Recent Labs Lab 04/25/17 1122 04/25/17 1154  GLUCAP 77 80    Microbiology: Results for orders placed or performed during the hospital encounter of 03/30/17  Aerobic/Anaerobic Culture (surgical/deep wound)     Status: Abnormal   Collection Time: 03/30/17  7:56 PM  Result Value Ref Range Status   Specimen Description WOUND LEFT THIGH  Final   Special Requests NONE  Final   Gram Stain   Final    ABUNDANT WBC PRESENT, PREDOMINANTLY PMN ABUNDANT GRAM POSITIVE COCCI ABUNDANT GRAM NEGATIVE RODS MODERATE GRAM POSITIVE RODS MODERATE GRAM NEGATIVE COCCI    Culture (A)  Final    MULTIPLE ORGANISMS PRESENT, NONE PREDOMINANT NO ANAEROBES ISOLATED Performed at [facility], [address], [place], [place] [address]    Report Status 04/05/2017 FINAL  Final  MRSA PCR Screening     Status: Abnormal   Collection Time: 03/30/17  8:58 PM  Result Value Ref Range Status   MRSA by PCR POSITIVE (A) NEGATIVE Final    Comment:        The GeneXpert MRSA Assay (FDA  approved for NASAL specimens only), is one component of a comprehensive MRSA colonization surveillance program. It is not intended to diagnose MRSA infection nor to guide or monitor treatment for MRSA infections. RESULT CALLED TO, READ BACK BY AND VERIFIED WITH: KARA CAMPBELL AT 2230 03/30/17.PMH   Body fluid culture     Status: None   Collection Time: 04/02/17 11:50 AM  Result Value Ref Range Status   Specimen Description PLEURAL  Final   Special Requests NONE  Final   Gram Stain NO WBC SEEN NO ORGANISMS SEEN   Final   Culture   Final    NO GROWTH 3 DAYS Performed at [facility], [address], [place], [place] [address]    Report Status 04/05/2017 FINAL  Final  Acid Fast Smear (AFB)     Status:  None   Collection Time: 04/02/17 11:50 AM  Result Value Ref Range Status   AFB Specimen Processing Concentration  Final   Acid Fast Smear Negative  Final    Comment: (NOTE) Performed At: [facility] [address] [place], [place] [id number] Mila Homer MD WN:[id number]    Source (AFB) PLEURAL  Final    Coagulation Studies: No results for input(s): LABPROT, INR in the last 72 hours.  Urinalysis: No results for input(s): COLORURINE, LABSPEC, PHURINE, GLUCOSEU, HGBUR, BILIRUBINUR, KETONESUR, PROTEINUR, UROBILINOGEN, NITRITE, LEUKOCYTESUR in the last 72 hours.  Invalid input(s): APPERANCEUR    Imaging: No results found.   Medications:    . amLODipine  10 mg Oral Daily  . apixaban  5 mg Oral BID  . bisoprolol  10 mg Oral Daily  . buPROPion  100 mg Oral BID  . collagenase  1 application Topical Daily  . docusate sodium  100 mg Oral BID  . epoetin (EPOGEN/PROCRIT) injection  10,000 Units Intravenous Q M,W,F-HD  . feeding supplement (NEPRO CARB STEADY)  237 mL Oral TID BM  . ferrous sulfate  325 mg Oral Q1200  . heparin  5 mL Intravenous Once per day on Mon Wed Fri  . irbesartan  300 mg Oral QHS  . levothyroxine  75 mcg Oral QAC breakfast  . multivitamin  1 tablet Oral QHS  . nystatin   Topical BID  . sertraline  50 mg Oral Daily  . torsemide  40 mg Oral Daily   acetaminophen **OR** acetaminophen, HYDROcodone-acetaminophen, morphine injection, ondansetron **OR** ondansetron (ZOFRAN) IV  Assessment/ Plan:  [name] is a 36 y.o. white male with hypertension, morbid obesity, pulmonary hypertension, hypothyroidism, hyperlipidemia, depression/anxiety, coronary artery disease, congestive heart failure, who was admitted to [facility] on 03/30/2017  1. ESRD. (Initial HD on 04/02/17) -24 urine cr clearance is 5cc/min - patient seen and evaluated during hemodialysis treatment today.  Outpatient hemodialysis placement ongoing at the moment.  2. Anasarca, with  hypertension, proteinuria. Echocardiogram with normal systolic function 8/18. No visualization of hepatic cirrhosis on CT abd/pelvis Status post large volume paracentesis on 9/5 for 7 liters. Left thoracentesis on 8/27 - Edema has significantly improved with diuresis and dialysis.  Only trace edema on exam today.  3. Anemia of chronic kidney disease:  - hemoglobin currently 9.8.  Continue Epogen with dialysis.  4. Generalized weakness with muscle wasting. No muscle biopsy performed.  Neurology and rheumatology evaluation performed. - holding statin - PT / OT - sat in chair for HD for 3.5 hrs -  Painful lesions on bilateral lower extremities.  Obtain rheumatology opinion regarding these.    LOS: 9 Chaise Mahabir  9/17/20181:50 PM

## 2017-04-30 NOTE — Progress Notes (Signed)
Patient continues to do well with bipap therapy at night. No distress noted. Unit in use with o2 bleedin.

## 2017-04-30 NOTE — Procedures (Signed)
Pt arrived to HDU via hospital transport staff at or about 1130 and was assessed prior to tx and found to have a RIJ, which was accessed per p&p w/o issue; order includes RTD 3.5 hours on 3251 bath, BFR 400, DFR 800 and attempt to achieve 2.5 kg fluid removal as tolerated by pt; pt initiated at 1145 w/o issue; pt completed his entire tx and achieved target of 2.5 kg fluid removal w/o complication, all blood was rinsed back and catheter care performed per p&p; pt was stable at the end of his tx and when leaving HDU under the care of transport staff; report called to primary RN

## 2017-04-30 NOTE — Progress Notes (Signed)
Patient ID: [name], male   DOB: 30-May-1981, 36 y.o.   MRN: [id number]  [employer] PROGRESS NOTE  [name] WUJ:[id number] DOB: Oct 02, 1980 DOA: 04/21/2017 PCP: Patient, No Pcp Per  HPI/Subjective: Patient with severe pain in the lesions on his lower extremities. He has 2 ulcerations. He also has a couple areas that almost look like a bruise but there are not. These are also very painful to the patient.  Objective: Vitals:   04/30/17 1400 04/30/17 1430  BP: 106/87 114/88  Pulse: 73 75  Resp: 19 14  Temp:    SpO2:      Filed Weights   04/21/17 1748 04/25/17 1017 04/30/17 1119  Weight: 111.1 kg (245 lb) 111.1 kg (245 lb) 102.5 kg (226 lb)    ROS: Review of Systems  Constitutional: Negative for chills and fever.  Eyes: Negative for blurred vision.  Respiratory: Negative for cough and shortness of breath.   Cardiovascular: Negative for chest pain.  Gastrointestinal: Negative for abdominal pain, constipation, diarrhea, nausea and vomiting.  Genitourinary: Negative for dysuria.  Musculoskeletal: Positive for myalgias. Negative for joint pain.  Neurological: Negative for dizziness and headaches.   Exam: Physical Exam  Constitutional: He is oriented to person, place, and time.  HENT:  Nose: No mucosal edema.  Mouth/Throat: No oropharyngeal exudate or posterior oropharyngeal edema.  Eyes: Pupils are equal, round, and reactive to light. Conjunctivae, EOM and lids are normal.  Neck: No JVD present. Carotid bruit is not present. No edema present. No thyroid mass and no thyromegaly present.  Cardiovascular: S1 normal and S2 normal.  Exam reveals no gallop.   No murmur heard. Pulses:      Dorsalis pedis pulses are 2+ on the right side, and 2+ on the left side.  Respiratory: No respiratory distress. He has no wheezes. He has no rhonchi. He has no rales.  GI: Soft. Bowel sounds are normal. There is no tenderness.  Musculoskeletal:       Right ankle: He exhibits no swelling.        Left ankle: He exhibits no swelling.  Lymphadenopathy:    He has no cervical adenopathy.  Neurological: He is alert and oriented to person, place, and time. No cranial nerve deficit.  Patient able to straight leg raise and lift his arms up over his head.  Skin: Skin is warm. Nails show no clubbing.  Large ulceration left thigh. Some area of greenish surrounding it. Right thigh laterally and area that looks like a healing ulceration. Numerous areas bilaterally that almost look like a bruise but they're very painful to palpation to the patient.  Psychiatric: He has a normal mood and affect.      Data Reviewed: Basic Metabolic Panel:  Recent Labs Lab 04/24/17 0405 04/25/17 0657 04/26/17 1455 04/29/17 0446  NA 139 136 137  --   K 3.4* 3.4* 3.6  --   CL 103 101 101  --   CO2 --   GLUCOSE 85 83 80  --   BUN 23* 29* 35*  --   CREATININE 2.93* 3.50* 3.96* 3.56*  CALCIUM 8.4* 8.6* 8.4*  --   PHOS 4.2  --  5.3*  --    Liver Function Tests:  Recent Labs Lab 04/24/17 0405 04/26/17 1455  ALBUMIN 2.0* 2.0*   CBC:  Recent Labs Lab 04/24/17 0405 04/26/17 1455 04/29/17 0446  WBC 14.8* 14.4* 13.1*  HGB 9.3* 9.2* 9.8*  HCT 29.2* 29.6* 30.9*  MCV 83.7 84.3  86.1  PLT 403 456* 413   BNP (last 3 results)  Recent Labs  03/30/17 1820  BNP 61.0   CBG:  Recent Labs Lab 04/25/17 1122 04/25/17 1154  GLUCAP 77 80    Scheduled Meds: . amLODipine  10 mg Oral Daily  . apixaban  5 mg Oral BID  . bisoprolol  10 mg Oral Daily  . buPROPion  100 mg Oral BID  . collagenase  1 application Topical Daily  . docusate sodium  100 mg Oral BID  . epoetin (EPOGEN/PROCRIT) injection  10,000 Units Intravenous Q M,W,F-HD  . feeding supplement (NEPRO CARB STEADY)  237 mL Oral TID BM  . ferrous sulfate  325 mg Oral Q1200  . heparin  5 mL Intravenous Once per day on Mon Wed Fri  . irbesartan  300 mg Oral QHS  . levothyroxine  75 mcg Oral QAC breakfast  . multivitamin  1  tablet Oral QHS  . nystatin   Topical BID  . sertraline  50 mg Oral Daily  . torsemide  40 mg Oral Daily    Assessment/Plan:  1. Acute kidney injury on chronic kidney disease now progressed to end-stage renal disease. Patient has nephrotic range proteinuria. Patient has a catheter in his right chest. They are checking to see if he is able to sit up with dialysis. They're looking into an outpatient slot. 2. Skin lesions and ulcerations of the lower extremity. Wondering if this is leukoclastic vasculitis. I spoke with rheumatology Dr. Gavin [name] and he recommended a biopsy. At [facility] they were unable to do a kidney biopsy. I will try to reach dermatology to see if they can do a punch biopsy. If I need to hold anticoagulation prior to this I will. 3. Acute hypoxic respiratory failure secondary to diastolic congestive heart failure and pleural effusion. Patient now off oxygen. 4. Anasarca. Dialysis to remove fluid. Patient had thoracentesis and paracentesis previously. 5. Significant muscle weakness. 6. Severe malnutrition. 7. Essential hypertension on Norvasc this a proton irbesartan 8. 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 Anemia of chronic disease 10.   Code Status:     Code Status Orders        Start     Ordered   04/21/17 1205  Full code  Continuous     04/21/17 1205    Code Status History    Date Active Date Inactive Code Status Order ID Comments User Context   03/30/2017  8:52 PM 04/17/2017  1:02 AM Full Code [id number]  Brian Baas, MD Inpatient      Disposition Plan: once dialysis slot obtained as outpatient potentially can get out of the hospital.  Consultants:  Nephrology  Rheumatology  Time spent: 25 minutes  [name]  [employer]

## 2017-04-30 NOTE — Progress Notes (Signed)
Physical Therapy Treatment Patient Details Name: [name] MRN: [id number] DOB: [birth year] Today's Date: 04/30/2017    History of Present Illness Pt is a 36 y.o. M who presented to the ED from [facility] with SOB, renal failure, multiple skin wounds, and swelling in B LE. Pt admitted 03/30/2017 with acute on chronic renal failure, acute on chronic respiratory failure with hypoxia, and B pulmonary effusion. Pt s/p R IJ temporary HD catheter placement, s/p debridement of a L thigh wound, and s/p L thoracentesis. Hospital stay complicated by transfer to CCU (now returned to floor) for hypercarbic respiratory failure requiring BiPAP; additionally significant for L thoracentesis (8/27) with removal of 2L and paracentesis (8/29) with removal of 5L.  Currently on 4L supplemental O2 via .   Recently transferred to outside hospital for consideration of muscle biopsy; unable to complete biopsy, but did undergo paracentesis (5+7L removed) and L thoracentesis.  Returned to [facility] for continued care.  PMH: occassional tremors, B lymphedema, obesity, CKD, R cardiac cath (2016), HLD, HTN, major depression, diastolic CHF, primary pulmonary HTN, hypothyroidism, and new dialysis patient.    PT Comments    Patient with noted improvement in activity tolerance this date.  Completing supine/sit (transition towards L) with mod assist from therapist.  Difficulty with full rotation due to abdominal adiposity/masses, but good effort throughout task.  Able to maintain static sitting in neutral sitting posture, with only sup/stand by assist from therapist; minimal discomfort/fatigue reported with upright positioning.  Planned to progress sitting activity, but transport present for upcoming dialysis treatment session. Will plan to progress activity (forward trunk leaning/weight shift, closed-chain activation of bilat LEs), attempt boosting from bed surface, next session.  Of note, per chart, patient able to tolerate last  1-2 dialysis treatment sessions upright in chair.  Now has bariatric chair for use for improved comfort/positioning.   Follow Up Recommendations  SNF     Equipment Recommendations  None recommended by PT    Recommendations for Other Services       Precautions / Restrictions Precautions Precautions: Fall;Other (comment) Precaution Comments: R IJ perm cath, contact iso Restrictions Weight Bearing Restrictions: No    Mobility  Bed Mobility Overal bed mobility: Needs Assistance Bed Mobility: Supine to Sit     Supine to sit: Mod assist     General bed mobility comments: difficulty with full rotation due to lymphatic masses at sides; heavy use of bedrails to assist with truncal elevation; mod assist from therapist to complete  Transfers Overall transfer level: Needs assistance Equipment used: None (hoyer lift) Transfers: Sit to/from Stand Sit to Stand: +2 physical assistance;Total assist            Ambulation/Gait             General Gait Details: unsafe/unable   Stairs            Wheelchair Mobility    Modified Rankin (Stroke Patients Only)       Balance Overall balance assessment: Needs assistance Sitting-balance support: No upper extremity supported;Feet supported Sitting balance-Leahy Scale: Fair                                      Cognition Arousal/Alertness: Awake/alert Behavior During Therapy: WFL for tasks assessed/performed Overall Cognitive Status: Within Functional Limits for tasks assessed  Exercises Other Exercises Other Exercises: Unsupported sitting, neutral sitting posture, x5 minutes, close sup. Significant improvement in tolerance and ability to acheive neutral position this date.  Unable to bring LEs to neutral rotation due to hip tightness, abdominal adiposity (maintains in ER/abduct position throughout)    General Comments        Pertinent  Vitals/Pain Pain Assessment: Faces Faces Pain Scale: Hurts even more Pain Location: L > R thigh Pain Descriptors / Indicators: Discomfort;Pressure;Sore;Tender Pain Intervention(s): Limited activity within patient's tolerance;Monitored during session;Repositioned    Home Living                      Prior Function            PT Goals (current goals can now be found in the care plan section) Acute Rehab PT Goals Patient Stated Goal: to get stronger PT Goal Formulation: With patient Time For Goal Achievement: 05/08/17 Potential to Achieve Goals: Fair Progress towards PT goals: Progressing toward goals    Frequency    Min 2X/week      PT Plan Current plan remains appropriate    Co-evaluation              AM-PAC PT "6 Clicks" Daily Activity  Outcome Measure  Difficulty turning over in bed (including adjusting bedclothes, sheets and blankets)?: Unable Difficulty moving from lying on back to sitting on the side of the bed? : Unable Difficulty sitting down on and standing up from a chair with arms (e.g., wheelchair, bedside commode, etc,.)?: Unable Help needed moving to and from a bed to chair (including a wheelchair)?: Total Help needed walking in hospital room?: Total Help needed climbing 3-5 steps with a railing? : Total 6 Click Score: 6    End of Session Equipment Utilized During Treatment: Oxygen Activity Tolerance: Patient tolerated treatment well Patient left: in chair;with call bell/phone within reach (leaving unit for dialysis) Nurse Communication: Mobility status PT Visit Diagnosis: Muscle weakness (generalized) (M62.81);Difficulty in walking, not elsewhere classified (R26.2);Pain Pain - Right/Left: Left Pain - part of body: Leg     Time: 1610-9604 PT Time Calculation (min) (ACUTE ONLY): 24 min  Charges:  $Therapeutic Activity: 23-37 mins                    G Codes:       Deaisa Merida H. Manson Passey, PT, DPT, NCS 04/30/17, 11:47  AM [phone]

## 2017-04-30 NOTE — Progress Notes (Signed)
HD STARTED  

## 2017-04-30 NOTE — Progress Notes (Signed)
PRE DIALYSIS ASSESSMENT 

## 2017-05-01 LAB — PHOSPHORUS: Phosphorus: 3.7 mg/dL (ref 2.5–4.6)

## 2017-05-01 LAB — MRSA PCR SCREENING: MRSA by PCR: POSITIVE — AB

## 2017-05-01 MED ORDER — SODIUM CHLORIDE 0.9 % IV SOLN: 100.0000 mL | INTRAVENOUS | Status: DC | PRN

## 2017-05-01 MED ORDER — HEPARIN SODIUM (PORCINE) 1000 UNIT/ML DIALYSIS
1000.0000 [IU] | INTRAMUSCULAR | Status: DC | PRN
  Filled 2017-05-01: qty 1

## 2017-05-01 MED ORDER — PENTAFLUOROPROP-TETRAFLUOROETH EX AERO
1.0000 "application " | INHALATION_SPRAY | CUTANEOUS | Status: DC | PRN
  Filled 2017-05-01: qty 30

## 2017-05-01 MED ORDER — LIDOCAINE-PRILOCAINE 2.5-2.5 % EX CREA
1.0000 "application " | TOPICAL_CREAM | CUTANEOUS | Status: DC | PRN
  Filled 2017-05-01: qty 5

## 2017-05-01 MED ORDER — FENTANYL 12 MCG/HR TD PT72
12.5000 ug | MEDICATED_PATCH | TRANSDERMAL | Status: DC
  Administered 2017-05-01: 12.5 ug via TRANSDERMAL
  Filled 2017-05-01: qty 1

## 2017-05-01 MED ORDER — ALTEPLASE 2 MG IJ SOLR: 2.0000 mg | Freq: Once | INTRAMUSCULAR | Status: DC | PRN

## 2017-05-01 MED ORDER — LIDOCAINE HCL (PF) 1 % IJ SOLN
5.0000 mL | INTRAMUSCULAR | Status: DC | PRN
  Filled 2017-05-01: qty 5

## 2017-05-01 NOTE — Consult Note (Signed)
HPI  [name] is a 36 year old white male with hypertension, morbid obesity, pulmonary hypertension, hypothyroidism, hyperlipidemia, depression/anxiety, coronary artery disease, congestive heart failure, and ESRD seen in consultation at the request of Dr. Renae Gloss for evaluation of lower extremity skin lesions.  [name] states that he has had a nonhealing ulcer on the left thigh present for several months.  Tender Purple lesions on the legs have appeared more recently (weeks).    EXAM  General: Chronically ill appearing.  Resting comfortably in dialysis unit.  Pleasant.  Alert and oriented. Skin:  Focused examination of the chest, abdomen, R lower extremity, L lower extremity, R upper extremity, L upper extremity perfomed and remarkable for:  --Large, deep, sharply dermarcated ulcer with fibrinous and purulent debris on the left thigh --Multple (10-15) violaceous indurated tender nonblanching purpuric plaques  ASSESSMENT AND PLAN   Purpuric indurated plaques and ulceration in the setting of renal failure.  Diagnostic possibilities include medium vessel vasculitis (Wegener's, polyarteritis nodosa, other ANCA vasculitis) versus a vasculopathy (such as calciphylaxis).  After obtaining informed verbal consent from the patient, a representative purpuric plaque from the right thigh was biopsied using a 5mm punch tool.  Subcutaneous fat was included in the biopsy.  The wound was closed with two 4-0 Nylon sutures and a bandage was applied.   I will try to reach the patient with biopsy results when available (probably Friday), though this may prove difficult with him moving to a different facility.  My contact information was given to the patient, and I will reach out to Dr. Cherylann Ratel as well.  Sutures should be removed in 2 weeks.  Thank you for the consult.  [facility]  [phone]

## 2017-05-01 NOTE — Progress Notes (Signed)
OT Cancellation Note  Patient Details Name: [name] MRN: [id number] DOB: [birth year]   Cancelled Treatment:    Reason Eval/Treat Not Completed: Patient at procedure or test/ unavailable. Order received chart reviewed. Upon attempt, per RN, pt about to leave for dialysis. Will hold this date and re-attempt next morning as pt is available and medically appropriate for OT evaluation.  Richrd Prime, MPH, MS, OTR/L ascom [phone] 05/01/17, 2:49 PM

## 2017-05-01 NOTE — Progress Notes (Signed)
Pre dialysis assessment 

## 2017-05-01 NOTE — Progress Notes (Signed)
Hd completed 

## 2017-05-01 NOTE — Progress Notes (Signed)
Hd started  

## 2017-05-01 NOTE — Progress Notes (Signed)
[facility]  ROUNDING NOTE   Subjective:  Patient did have hemodialysis yesterday.  He stated that it was a bit difficult for him to be seated in the chair but he did ultimately complete the treatment. He will be on dialysis on Tuesday, Thursday, Saturday as an outpatient. We have filled out his outpatient orders.  Objective:  Vital signs in last 24 hours:  Temp:  [97.6 F (36.4 C)-98.4 F (36.9 C)] 97.6 F (36.4 C) (09/18 0441) Pulse Rate:  [66-78] 68 (09/18 0441) Resp:  [10-22] 20 (09/18 0441) BP: (106-158)/(81-103) 129/81 (09/18 0441) SpO2:  [100 %] 100 % (09/18 0441) Weight:  [102.5 kg (226 lb)] 102.5 kg (226 lb) (09/17 1119)  Weight change:  Filed Weights   04/21/17 1748 04/25/17 1017 04/30/17 1119  Weight: 111.1 kg (245 lb) 111.1 kg (245 lb) 102.5 kg (226 lb)    Intake/Output: I/O last 3 completed shifts: In: 240 [P.O.:240] Out: 2900 [Urine:400; Other:2500]   Intake/Output this shift:  Total I/O In: 240 [P.O.:240] Out: 200 [Urine:200]  Physical Exam: General: Chronically ill-appearing  Head: +temporal wasting  Eyes: Anicteric  Neck: No venous distention  Lungs:  Clear to auscultation bilaterally, Normal effort   Heart: S1S2 no rubs  Abdomen:  Soft, nontender, obese, pannus    Extremities: Trace LE edema  Neurologic: significant generalized muscle wasting  Skin: Prurigo lesions, rt thigh wound, necrotic lesions on thighs  Access: RIJ permcath 9/12 Dr. Wyn Quaker    Basic Metabolic Panel:  Recent Labs Lab 04/25/17 0657 04/26/17 1455 04/29/17 0446  NA 136 137  --   K 3.4* 3.6  --   CL 101 101  --   CO2 29 29  --   GLUCOSE 83 80  --   BUN 29* 35*  --   CREATININE 3.50* 3.96* 3.56*  CALCIUM 8.6* 8.4*  --   PHOS  --  5.3*  --     Liver Function Tests:  Recent Labs Lab 04/26/17 1455  ALBUMIN 2.0*   No results for input(s): LIPASE, AMYLASE in the last 168 hours. No results for input(s): AMMONIA in the last 168 hours.  CBC:  Recent  Labs Lab 04/26/17 1455 04/29/17 0446  WBC 14.4* 13.1*  HGB 9.2* 9.8*  HCT 29.6* 30.9*  MCV 84.3 86.1  PLT 456* 413    Cardiac Enzymes: No results for input(s): CKTOTAL, CKMB, CKMBINDEX, TROPONINI in the last 168 hours.  BNP: Invalid input(s): POCBNP  CBG:  Recent Labs Lab 04/25/17 1122 04/25/17 1154  GLUCAP 77 80    Microbiology: Results for orders placed or performed during the hospital encounter of 03/30/17  Aerobic/Anaerobic Culture (surgical/deep wound)     Status: Abnormal   Collection Time: 03/30/17  7:56 PM  Result Value Ref Range Status   Specimen Description WOUND LEFT THIGH  Final   Special Requests NONE  Final   Gram Stain   Final    ABUNDANT WBC PRESENT, PREDOMINANTLY PMN ABUNDANT GRAM POSITIVE COCCI ABUNDANT GRAM NEGATIVE RODS MODERATE GRAM POSITIVE RODS MODERATE GRAM NEGATIVE COCCI    Culture (A)  Final    MULTIPLE ORGANISMS PRESENT, NONE PREDOMINANT NO ANAEROBES ISOLATED Performed at [facility], [address] [place], [place] [address]    Report Status 04/05/2017 FINAL  Final  MRSA PCR Screening     Status: Abnormal   Collection Time: 03/30/17  8:58 PM  Result Value Ref Range Status   MRSA by PCR POSITIVE (A) NEGATIVE Final    Comment:  The GeneXpert MRSA Assay (FDA approved for NASAL specimens only), is one component of a comprehensive MRSA colonization surveillance program. It is not intended to diagnose MRSA infection nor to guide or monitor treatment for MRSA infections. RESULT CALLED TO, READ BACK BY AND VERIFIED WITH: KARA CAMPBELL AT 2230 03/30/17.PMH   Body fluid culture     Status: None   Collection Time: 04/02/17 11:50 AM  Result Value Ref Range Status   Specimen Description PLEURAL  Final   Special Requests NONE  Final   Gram Stain NO WBC SEEN NO ORGANISMS SEEN   Final   Culture   Final    NO GROWTH 3 DAYS Performed at [facility], [address], [place], [place] [address]    Report Status  04/05/2017 FINAL  Final  Acid Fast Smear (AFB)     Status: None   Collection Time: 04/02/17 11:50 AM  Result Value Ref Range Status   AFB Specimen Processing Concentration  Final   Acid Fast Smear Negative  Final    Comment: (NOTE) Performed At: [facility] [address] [place], [place] [id number] Mila Homer MD YQ:[id number]    Source (AFB) PLEURAL  Final    Coagulation Studies: No results for input(s): LABPROT, INR in the last 72 hours.  Urinalysis: No results for input(s): COLORURINE, LABSPEC, PHURINE, GLUCOSEU, HGBUR, BILIRUBINUR, KETONESUR, PROTEINUR, UROBILINOGEN, NITRITE, LEUKOCYTESUR in the last 72 hours.  Invalid input(s): APPERANCEUR    Imaging: No results found.   Medications:    . amLODipine  10 mg Oral Daily  . apixaban  5 mg Oral BID  . bisoprolol  10 mg Oral Daily  . buPROPion  100 mg Oral BID  . collagenase  1 application Topical Daily  . docusate sodium  100 mg Oral BID  . epoetin (EPOGEN/PROCRIT) injection  10,000 Units Intravenous Q M,W,F-HD  . epoetin (EPOGEN/PROCRIT) injection  10,000 Units Subcutaneous Once  . feeding supplement (NEPRO CARB STEADY)  237 mL Oral TID BM  . ferrous sulfate  325 mg Oral Q1200  . heparin  5 mL Intravenous Once per day on Mon Wed Fri  . irbesartan  300 mg Oral QHS  . levothyroxine  75 mcg Oral QAC breakfast  . multivitamin  1 tablet Oral QHS  . nystatin   Topical BID  . sertraline  50 mg Oral Daily  . torsemide  40 mg Oral Daily   acetaminophen **OR** acetaminophen, HYDROcodone-acetaminophen, morphine injection, ondansetron **OR** ondansetron (ZOFRAN) IV  Assessment/ Plan:  [name] is a 36 y.o. white male with hypertension, morbid obesity, pulmonary hypertension, hypothyroidism, hyperlipidemia, depression/anxiety, coronary artery disease, congestive heart failure, who was admitted to [facility] on 03/30/2017  1.  ESRD. (Initial HD on 04/02/17) -24 urine cr clearance is 5cc/min - Exact etiology of  end-stage renal disease never determined as the patient was unable to undergo renal biopsy both here as well as [facility]. He will need ongoing dialysis. PermCath is functional at the moment. He will be having dialysis on a Tuesday, Thursday, Saturday schedule as an outpatient therefore we will go ahead and dialyze him today as a short treatment. Patient agreeable to this. He will need to dialyze in a chair as an outpatient.  2. Anasarca, with hypertension, proteinuria. Echocardiogram with normal systolic function 8/18. No visualization of hepatic cirrhosis on CT abd/pelvis Status post large volume paracentesis on 9/5 for 7 liters. Left thoracentesis on 8/27 - Overall from earlier admission the patient's edema has  significantly improved. Continue ultrafiltration with dialysis.  3. Anemia of chronic kidney disease:  - Patient will continue on erythropoietin stimulate agents as an outpatient.  4. Generalized weakness with muscle wasting. No muscle biopsy performed.  Neurology and rheumatology evaluation performed. - holding statin - PT / OT - sat in chair for HD for 3.5 hrs -  Painful lesions on bilateral lower extremities.  Awaiting rheumatology input.   LOS: 10 Eddison Searls 9/18/201810:30 AM

## 2017-05-01 NOTE — Care Management (Signed)
Per Dimas Chyle HD liaison patient arranged for [facility] [address] TTS at 11:40 am

## 2017-05-01 NOTE — Progress Notes (Signed)
Post dialysis assessment 

## 2017-05-01 NOTE — Progress Notes (Signed)
Patient ID: [name]   DOB: November 20, 1980, 36 y.o.   MRN: [id number]  [employer] PROGRESS NOTE  [name] WGN:[id number] DOB: 09-25-1980 DOA: 04/21/2017 PCP: Patient, No Pcp Per  HPI/Subjective: Patient still having a lot of pain with the skin lesions and ulcerations on his lower extremities. Especially if you touch the lesions or if something rubs up on them.  Objective: Vitals:   04/30/17 2008 05/01/17 0441  BP: (!) 158/103 129/81  Pulse: 72 68  Resp: 20 20  Temp: 98.4 F (36.9 C) 97.6 F (36.4 C)  SpO2: 100% 100%    Filed Weights   04/21/17 1748 04/25/17 1017 04/30/17 1119  Weight: 111.1 kg (245 lb) 111.1 kg (245 lb) 102.5 kg (226 lb)    ROS: Review of Systems  Constitutional: Negative for chills and fever.  Eyes: Negative for blurred vision.  Respiratory: Negative for cough and shortness of breath.   Cardiovascular: Negative for chest pain.  Gastrointestinal: Negative for abdominal pain, constipation, diarrhea, nausea and vomiting.  Genitourinary: Negative for dysuria.  Musculoskeletal: Positive for myalgias. Negative for joint pain.  Neurological: Negative for dizziness and headaches.   Exam: Physical Exam  Constitutional: He is oriented to person, place, and time.  HENT:  Nose: No mucosal edema.  Mouth/Throat: No oropharyngeal exudate or posterior oropharyngeal edema.  Eyes: Pupils are equal, round, and reactive to light. Conjunctivae, EOM and lids are normal.  Neck: No JVD present. Carotid bruit is not present. No edema present. No thyroid mass and no thyromegaly present.  Cardiovascular: S1 normal and S2 normal.  Exam reveals no gallop.   No murmur heard. Pulses:      Dorsalis pedis pulses are 2+ on the right side, and 2+ on the left side.  Respiratory: No respiratory distress. He has no wheezes. He has no rhonchi. He has no rales.  GI: Soft. Bowel sounds are normal. There is no tenderness.  Musculoskeletal:       Right ankle: He exhibits no  swelling.       Left ankle: He exhibits no swelling.  Lymphadenopathy:    He has no cervical adenopathy.  Neurological: He is alert and oriented to person, place, and time. No cranial nerve deficit.  Patient able to straight leg raise and lift his arms up over his head.  Skin: Skin is warm. Nails show no clubbing.  Large ulceration left thigh. Some area of greenish surrounding it. Right thigh laterally and area that looks like a healing ulceration. Numerous areas bilaterally that almost look like a bruise but they're very painful to palpation to the patient.  Psychiatric: He has a normal mood and affect.      Data Reviewed: Basic Metabolic Panel:  Recent Labs Lab 04/25/17 0657 04/26/17 1455 04/29/17 0446  NA 136 137  --   K 3.4* 3.6  --   CL 101 101  --   CO2 29 29  --   GLUCOSE 83 80  --   BUN 29* 35*  --   CREATININE 3.50* 3.96* 3.56*  CALCIUM 8.6* 8.4*  --   PHOS  --  5.3*  --    Liver Function Tests:  Recent Labs Lab 04/26/17 1455  ALBUMIN 2.0*   CBC:  Recent Labs Lab 04/26/17 1455 04/29/17 0446  WBC 14.4* 13.1*  HGB 9.2* 9.8*  HCT 29.6* 30.9*  MCV 84.3 86.1  PLT 456* 413   BNP (last 3 results)  Recent Labs  03/30/17 1820  BNP 61.0  CBG:  Recent Labs Lab 04/25/17 1122 04/25/17 1154  GLUCAP 77 80    Scheduled Meds: . amLODipine  10 mg Oral Daily  . apixaban  5 mg Oral BID  . bisoprolol  10 mg Oral Daily  . buPROPion  100 mg Oral BID  . collagenase  1 application Topical Daily  . docusate sodium  100 mg Oral BID  . epoetin (EPOGEN/PROCRIT) injection  10,000 Units Intravenous Q M,W,F-HD  . epoetin (EPOGEN/PROCRIT) injection  10,000 Units Subcutaneous Once  . feeding supplement (NEPRO CARB STEADY)  237 mL Oral TID BM  . fentaNYL  12.5 mcg Transdermal Q72H  . ferrous sulfate  325 mg Oral Q1200  . heparin  5 mL Intravenous Once per day on Mon Wed Fri  . irbesartan  300 mg Oral QHS  . levothyroxine  75 mcg Oral QAC breakfast  .  multivitamin  1 tablet Oral QHS  . nystatin   Topical BID  . sertraline  50 mg Oral Daily  . torsemide  40 mg Oral Daily    Assessment/Plan:  1. Acute kidney injury on chronic kidney disease now progressed to end-stage renal disease. Patient has nephrotic range proteinuria. Patient has a catheter in his right chest. The patient does have an outpatient slot on Tuesday Thursday and Saturday.  Patient will have a half dialysis session today. 2. Skin lesions and ulcerations of the lower extremity. Wondering if this is leukoclastic vasculitis versus calciphylaxis. Dr. Adolphus Birchwood to do a skin biopsy after his office hours today.  Start low-dose fentanyl patch, short acting oxycodone. 3. Acute hypoxic respiratory failure secondary to diastolic congestive heart failure and pleural effusion. Patient now off oxygen.  Dialysis to manage fluid. Patient on oral torsemide 4. Anasarca. Dialysis to remove fluid. Patient had thoracentesis and paracentesis previously. 5. Significant muscle weakness. 6. Severe malnutrition. 7. Essential hypertension on Norvasc this a proton irbesartan 8. 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 Anemia of chronic disease 10. Central hypertension continue current medications  Code Status:     Code Status Orders        Start     Ordered   04/21/17 1205  Full code  Continuous     04/21/17 1205    Code Status History    Date Active Date Inactive Code Status Order ID Comments User Context   03/30/2017  8:52 PM 04/17/2017  1:02 AM Full Code [id number]  Enid Baas, MD Inpatient      Disposition Plan: potentially out to rehabilitation tomorrow  Consultants:  Nephrology  Rheumatology  dermatology  Time spent: 26 minutes  Alford Highland  [employer]

## 2017-05-01 NOTE — Clinical Social Work Note (Signed)
MD has stated potential discharge to return to [facility] tomorrow. York Spaniel MSW,LCSW [phone]

## 2017-05-02 LAB — PARATHYROID HORMONE, INTACT (NO CA): PTH: 16 pg/mL (ref 15–65)

## 2017-05-02 LAB — HEPATITIS B SURFACE ANTIGEN: HEP B S AG: NEGATIVE

## 2017-05-02 MED ORDER — OXYCODONE HCL 5 MG PO CAPS: ORAL_CAPSULE | ORAL | 0 refills | Status: DC

## 2017-05-02 MED ORDER — TORSEMIDE 20 MG PO TABS: 40.0000 mg | ORAL_TABLET | Freq: Every day | ORAL | 0 refills | Status: AC

## 2017-05-02 MED ORDER — NEPRO/CARBSTEADY PO LIQD: 237.0000 mL | Freq: Three times a day (TID) | ORAL | 0 refills | Status: DC

## 2017-05-02 MED ORDER — FENTANYL 12 MCG/HR TD PT72: 12.5000 ug | MEDICATED_PATCH | TRANSDERMAL | 0 refills | Status: DC

## 2017-05-02 MED ORDER — APIXABAN 5 MG PO TABS: 5.0000 mg | ORAL_TABLET | Freq: Two times a day (BID) | ORAL | 0 refills | Status: AC

## 2017-05-02 NOTE — Discharge Summary (Addendum)
[facility] - [place] at [facility]   PATIENT NAME: [name]    MR#:  [id number]  DATE OF BIRTH:  1981/08/03  DATE OF ADMISSION:  04/21/2017 ADMITTING PHYSICIAN: Enid Baas, MD  DATE OF DISCHARGE: 05/01/2017  PRIMARY CARE PHYSICIAN: Physician at [facility]   ADMISSION DIAGNOSIS:  acute rrenal failure Perm cath insert inpt rm 221 renal failure  DISCHARGE DIAGNOSIS:  Active Problems:   ARF (acute renal failure) (HCC)   Protein-calorie malnutrition, severe   SECONDARY DIAGNOSIS:   Past Medical History:  Diagnosis Date  . CKD (chronic kidney disease)   . Depression   . Diastolic CHF (HCC)   . Generalized anxiety disorder   . Hyperlipidemia   . Hypertension   . Hypertension   . Hypothyroidism   . Lymphedema   . Nephrotic syndrome   . Obesity   . Pulmonary hypertension (HCC)     HOSPITAL COURSE:   1.  End-stage renal disease. The patient had acute kidney injury on chronic kidney disease and now has progressed to end-stage renal disease. The patient also has nephrotic range proteinuria. The patient had a permacath placed in the right chest. Outpatient dialysis slot for Tuesday, Thursday and Saturday has been set up. The patient was transferred to [facility] to get a second opinion. They felt it was unsafe to get a renal biopsy. They did not recommend a muscle biopsy because they believe it was secondary to the process and not a primary muscle issue. The patient will be continued on dialysis at this time. 2. Skin lesions and ulcerations of lower extremity. The lesions are very painful to the patient.  I spoke with Dr. Adolphus Birchwood dermatology to get a skin biopsy which was done yesterday evening. Differential could be vasculitis such as a leukoclastic vasculitis or calciphylaxis. Case discussed with Dr. Cherylann Ratel and he will have Dr. Luther Redo follow-up on the results with Dr. Adolphus Birchwood. Further recommendations depending on biopsy results.  Local wound care with  Santyl and cover any open wounds. 3.  Chronic pain on these lower extremity lesions. I started low dose fentanyl patch and short acting oxycodone. Prescriptions written for these. 4. Acute on chronic hypoxic respiratory failure secondary to diastolic congestive heart failure and pleural effusion. Patient is on 4 L of oxygen chronically. Patient wears CPAP at night. Dialysis to manage fluid. Oral torsemide, bisoprolol, Avapro. 5. Anasarca. Dialysis and torsemide to remove fluid. Patient had thoracentesis and paracentesis previously. 6. Significant muscle weakness and muscle wasting. Cholesterol medication stopped. 7. Severe malnutrition. Continue protein drinks and eating protein as outpatient. 8. Essential hypertension on Norvasc, torsemide, irbesartan 9. 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 Anemia of chronic disease Procrit with dialysis  DISCHARGE CONDITIONS:   Satisfactory  CONSULTS OBTAINED:  Treatment Team:  Dasher, Cliffton Asters, MD  Nephrology team Rheumatology Vascular surgery  DRUG ALLERGIES:  No Known Allergies  DISCHARGE MEDICATIONS:   Current Discharge Medication List    START taking these medications   Details  fentaNYL (DURAGESIC - DOSED MCG/HR) 12 MCG/HR Place 1 patch (12.5 mcg total) onto the skin every 3 (three) days. Qty: 10 patch, Refills: 0    Nutritional Supplements (FEEDING SUPPLEMENT, NEPRO CARB STEADY,) LIQD Take 237 mLs by mouth 3 (three) times daily between meals. Qty: 90 Can, Refills: 0    torsemide (DEMADEX) 20 MG tablet Take 2 tablets (40 mg total) by mouth daily. Qty: 30 tablet, Refills: 0      CONTINUE these medications  which have CHANGED   Details  apixaban (ELIQUIS) 5 MG TABS tablet Take 1 tablet (5 mg total) by mouth 2 (two) times daily. Qty: 60 tablet, Refills: 0    oxycodone (OXY-IR) 5 MG capsule 5 mg every four hours for moderate pain; 10 mg every four hours for sever pain Qty: 60 capsule, Refills: 0       CONTINUE these medications which have NOT CHANGED   Details  amLODipine (NORVASC) 5 MG tablet Take 5 mg by mouth daily.    bisoprolol (ZEBETA) 10 MG tablet Take 10 mg by mouth daily.    buPROPion (WELLBUTRIN) 100 MG tablet Take 100 mg by mouth 2 (two) times daily.    collagenase (SANTYL) ointment Apply 1 application topically daily.    epoetin alfa (EPOGEN,PROCRIT) 2000 UNIT/ML injection 4,000 Units 3 (three) times a week.    sertraline (ZOLOFT) 50 MG tablet Take 50 mg by mouth daily.    docusate sodium (COLACE) 100 MG capsule Take 1 capsule (100 mg total) by mouth 2 (two) times daily. Qty: 10 capsule, Refills: 0    ferrous sulfate 325 (65 FE) MG tablet Take 1 tablet (325 mg total) by mouth daily at 12 noon. Refills: 3    irbesartan (AVAPRO) 300 MG tablet Take 1 tablet (300 mg total) by mouth at bedtime.    levothyroxine (SYNTHROID, LEVOTHROID) 75 MCG tablet Take 75 mcg by mouth daily before breakfast.    multivitamin (RENA-VIT) TABS tablet Take 1 tablet by mouth at bedtime. Refills: 0    nystatin (MYCOSTATIN/NYSTOP) powder Apply topically 2 (two) times daily.    ondansetron (ZOFRAN) 8 MG tablet Take by mouth every 8 (eight) hours as needed for nausea or vomiting.      STOP taking these medications     atorvastatin (LIPITOR) 40 MG tablet      cloNIDine (CATAPRES) 0.2 MG tablet      ferric gluconate 125 mg in sodium chloride 0.9 % 100 mL      furosemide (LASIX) 10 MG/ML solution      hydrALAZINE (APRESOLINE) 50 MG tablet      traMADol (ULTRAM) 50 MG tablet          DISCHARGE INSTRUCTIONS:   Follow-up with Dr. at [facility] healthcare one day Follow-up with nephrology and dialysis as outpatient.  If you experience worsening of your admission symptoms, develop shortness of breath, life threatening emergency, suicidal or homicidal thoughts you must seek medical attention immediately by calling 911 or calling your MD immediately  if symptoms less severe.  You Must  read complete instructions/literature along with all the possible adverse reactions/side effects for all the Medicines you take and that have been prescribed to you. Take any new Medicines after you have completely understood and accept all the possible adverse reactions/side effects.   Please note  You were cared for by a hospitalist during your hospital stay. If you have any questions about your discharge medications or the care you received while you were in the hospital after you are discharged, you can call the unit and asked to speak with the hospitalist on call if the hospitalist that took care of you is not available. Once you are discharged, your primary care physician will handle any further medical issues. Please note that NO REFILLS for any discharge medications will be authorized once you are discharged, as it is imperative that you return to your primary care physician (or establish a relationship with a primary care physician if you do not have  one) for your aftercare needs so that they can reassess your need for medications and monitor your lab values.    Today   CHIEF COMPLAINT:  Shortness of breath  HISTORY OF PRESENT ILLNESS:  [name]  is a 36 y.o. male initially came in 03/30/2017 with worsening renal failure and shortness of breath.   VITAL SIGNS:  Blood pressure 120/73, pulse 77, temperature (!) 97.5 F (36.4 C), temperature source Oral, resp. rate 18, height  (1.753 m), weight 102.5 kg (226 lb), SpO2 100 %.   PHYSICAL EXAMINATION:  GENERAL:  36 y.o.-year-old patient lying in the bed with no acute distress.  EYES: Pupils equal, round, reactive to light and accommodation. No scleral icterus. Extraocular muscles intact.  HEENT: Head atraumatic, normocephalic. Oropharynx and nasopharynx clear.  NECK:  Supple, no jugular venous distention. No thyroid enlargement, no tenderness.  LUNGS: Normal breath sounds bilaterally, no wheezing, rales,rhonchi or crepitation.  No use of accessory muscles of respiration.  CARDIOVASCULAR: S1, S2 normal. No murmurs, rubs, or gallops.  ABDOMEN: Soft, non-tender, non-distended. Bowel sounds present. No organomegaly or mass.  EXTREMITIES: No pedal edema, cyanosis, or clubbing.  NEUROLOGIC: Cranial nerves II through XII are intact. Muscle strength 5/5 in all extremities. Sensation intact. Gait not checked.  PSYCHIATRIC: The patient is alert and oriented x 3.  SKIN: Large left leg ulceration, right lateral leg large ulceration. Other areas on the skin look like purpura that are very painful. The one on the right leg was biopsied and has a Band-Aid over it.  DATA REVIEW:   CBC  Recent Labs Lab 04/29/17 0446  WBC 13.1*  HGB 9.8*  HCT 30.9*  PLT 413    Chemistries   Recent Labs Lab 04/26/17 1455 04/29/17 0446  NA 137  --   K 3.6  --   CL 101  --   CO2 29  --   GLUCOSE 80  --   BUN 35*  --   CREATININE 3.96* 3.56*  CALCIUM 8.4*  --      Microbiology Results  Results for orders placed or performed during the hospital encounter of 04/21/17  MRSA PCR Screening     Status: Abnormal   Collection Time: 05/01/17  3:00 PM  Result Value Ref Range Status   MRSA by PCR POSITIVE (A) NEGATIVE Final    Comment:        The GeneXpert MRSA Assay (FDA approved for NASAL specimens only), is one component of a comprehensive MRSA colonization surveillance program. It is not intended to diagnose MRSA infection nor to guide or monitor treatment for MRSA infections. RESULT CALLED TO, READ BACK BY AND VERIFIED WITH: JOSH SIMSER AT 1640 05/01/2017 BY TFK.        Management plans discussed with the patient, And he is in agreement.  CODE STATUS:     Code Status Orders        Start     Ordered   04/21/17 1205  Full code  Continuous     04/21/17 1205    Code Status History    Date Active Date Inactive Code Status Order ID Comments User Context   03/30/2017  8:52 PM 04/17/2017  1:02 AM Full Code [id number]   Enid Baas, MD Inpatient      TOTAL TIME TAKING CARE OF THIS PATIENT: 35 minutes.    Alford Highland M.D on 05/02/2017 at 9:32 AM  Between 7am to 6pm - Pager - [phone]  After 6pm go to [email or web address] - password  EP[facility]  [facility] Office  [phone]  CC: Primary care physician; [facility]

## 2017-05-02 NOTE — Progress Notes (Signed)
OT Cancellation Note  Patient Details Name: [name] MRN: [id number] DOB: [birth year]   Cancelled Treatment:    Reason Eval/Treat Not Completed: Other (comment). Order received, chart reviewed. Spoke with social work who indicated to hold OT evaluation at this time, as pt has discharge orders in place and will be discharging to [facility] today. Will sign off. Please re-consult if additional OT needs arise.   Richrd Prime, MPH, MS, OTR/L ascom [phone] 05/02/17, 10:09 AM

## 2017-05-02 NOTE — Progress Notes (Signed)
[name]  A and O x 4. VSS. Pt tolerating diet well. No complaints of pain or nausea. IV removed intact, prescriptions given. Pt voiced understanding of discharge instructions with no further questions. Pt discharged via EMS to [facility]. Called report to [facility] and given to [facility]     Allergies as of 05/02/2017   No Known Allergies     Medication List    STOP taking these medications   atorvastatin 40 MG tablet Commonly known as:  LIPITOR   cloNIDine 0.2 MG tablet Commonly known as:  CATAPRES   ferric gluconate 125 mg in sodium chloride 0.9 % 100 mL   furosemide 10 MG/ML solution Commonly known as:  LASIX   hydrALAZINE 50 MG tablet Commonly known as:  APRESOLINE   traMADol 50 MG tablet Commonly known as:  ULTRAM     TAKE these medications   amLODipine 5 MG tablet Commonly known as:  NORVASC Take 5 mg by mouth daily.   apixaban 5 MG Tabs tablet Commonly known as:  ELIQUIS Take 1 tablet (5 mg total) by mouth 2 (two) times daily.   bisoprolol 10 MG tablet Commonly known as:  ZEBETA Take 10 mg by mouth daily.   buPROPion 100 MG tablet Commonly known as:  WELLBUTRIN Take 100 mg by mouth 2 (two) times daily.   docusate sodium 100 MG capsule Commonly known as:  COLACE Take 1 capsule (100 mg total) by mouth 2 (two) times daily.   epoetin alfa 2000 UNIT/ML injection Commonly known as:  EPOGEN,PROCRIT 4,000 Units 3 (three) times a week.   feeding supplement (NEPRO CARB STEADY) Liqd Take 237 mLs by mouth 3 (three) times daily between meals.   fentaNYL 12 MCG/HR Commonly known as:  DURAGESIC - dosed mcg/hr Place 1 patch (12.5 mcg total) onto the skin every 3 (three) days.   ferrous sulfate 325 (65 FE) MG tablet Take 1 tablet (325 mg total) by mouth daily at 12 noon.   irbesartan 300 MG tablet Commonly known as:  AVAPRO Take 1 tablet (300 mg total) by mouth at bedtime.   levothyroxine 75 MCG tablet Commonly known as:   SYNTHROID, LEVOTHROID Take 75 mcg by mouth daily before breakfast.   multivitamin Tabs tablet Take 1 tablet by mouth at bedtime.   nystatin powder Commonly known as:  MYCOSTATIN/NYSTOP Apply topically 2 (two) times daily.   ondansetron 8 MG tablet Commonly known as:  ZOFRAN Take by mouth every 8 (eight) hours as needed for nausea or vomiting.   oxycodone 5 MG capsule Commonly known as:  OXY-IR 5 mg every four hours for moderate pain; 10 mg every four hours for sever pain What changed:  how much to take  how to take this  when to take this  reasons to take this  additional instructions   SANTYL ointment Generic drug:  collagenase Apply 1 application topically daily.   sertraline 50 MG tablet Commonly known as:  ZOLOFT Take 50 mg by mouth daily.   torsemide 20 MG tablet Commonly known as:  DEMADEX Take 2 tablets (40 mg total) by mouth daily.            Discharge Care Instructions        Start     Ordered   05/04/17 0000  fentaNYL (DURAGESIC - DOSED MCG/HR) 12 MCG/HR  every 72 hours     05/02/17 0917   05/02/17 0000  apixaban (ELIQUIS) 5 MG TABS tablet  2 times daily  05/02/17 0917   05/02/17 0000  torsemide (DEMADEX) 20 MG tablet  Daily     05/02/17 0917   05/02/17 0000  Nutritional Supplements (FEEDING SUPPLEMENT, NEPRO CARB STEADY,) LIQD  3 times daily between meals     05/02/17 0917   05/02/17 0000  oxycodone (OXY-IR) 5 MG capsule     05/02/17 0917      Vitals:   05/02/17 1257 05/02/17 1417  BP: (!) 145/94 (!) 149/97  Pulse: 73 75  Resp: 16 18  Temp: 98.4 F (36.9 C) 98 F (36.7 C)  SpO2: 100% 100%    Suzzanne Cloud

## 2017-05-02 NOTE — Care Management (Addendum)
[name] HD liaison notified of discharge.  

## 2017-05-02 NOTE — Progress Notes (Signed)
Nutrition Brief Note   Pt noted to have multiple violaceous indurated tender nonblanching purpuric plaques on his lower extremities. These lesions resemble purpura that could be possibly be related to a Vitamin C deficiency. Pt is at high risk for vitamin deficiencies as he is on hemodialysis, has multiple wounds, and is malnourished. Will send out ascorbic acid levels on pt today in order to rule this out. Pt seen by Dermatology yesterday who biopsied the lesions and is expecting results back on Friday. Pt scheduled to discharge today; RD will follow up on lab results.   Betsey Holiday MS, RD, LDN Pager #- [phone] After Hours Pager: [phone]

## 2017-05-02 NOTE — Progress Notes (Signed)
Heparin and Epogen not given per Dr. Cherylann Ratel order. Pt dialysis days have been change to Tuesday, Thursday and Saturday. Meds no meant to given by floor RN because pt is not going to dialysis today.

## 2017-05-02 NOTE — Progress Notes (Signed)
[facility]  ROUNDING NOTE   Subjective:  Patient did have skin biopsy yesterday. Appreciate assistance from dermatology. Differential includes vasculitis versus calciphylaxis.  Objective:  Vital signs in last 24 hours:  Temp:  [97.5 F (36.4 C)-98.8 F (37.1 C)] 97.5 F (36.4 C) (09/19 0542) Pulse Rate:  [66-77] 77 (09/19 0542) Resp:  [13-20] 18 (09/19 0542) BP: (120-148)/(73-94) 120/73 (09/19 0542) SpO2:  [100 %] 100 % (09/19 0542)  Weight change:  Filed Weights   04/21/17 1748 04/25/17 1017 04/30/17 1119  Weight: 111.1 kg (245 lb) 111.1 kg (245 lb) 102.5 kg (226 lb)    Intake/Output: I/O last 3 completed shifts: In: 240 [P.O.:240] Out: 1400 [Urine:400; Other:1000]   Intake/Output this shift:  Total I/O In: -  Out: 50 [Urine:50]  Physical Exam: General: Chronically ill-appearing  Head: +temporal wasting  Eyes: Anicteric  Neck: supple  Lungs:  Clear to auscultation bilaterally, Normal effort   Heart: S1S2 no rubs  Abdomen:  Soft, nontender, obese, pannus    Extremities: Trace LE edema  Neurologic: significant generalized muscle wasting  Skin: Prurigo lesions, rt thigh wound, necrotic lesions on thighs  Access: RIJ permcath 9/12 Dr. Wyn Quaker    Basic Metabolic Panel:  Recent Labs Lab 04/26/17 1455 04/29/17 0446 05/01/17 1545  NA 137  --   --   K 3.6  --   --   CL 101  --   --   CO2 29  --   --   GLUCOSE 80  --   --   BUN 35*  --   --   CREATININE 3.96* 3.56*  --   CALCIUM 8.4*  --   --   PHOS 5.3*  --  3.7    Liver Function Tests:  Recent Labs Lab 04/26/17 1455  ALBUMIN 2.0*   No results for input(s): LIPASE, AMYLASE in the last 168 hours. No results for input(s): AMMONIA in the last 168 hours.  CBC:  Recent Labs Lab 04/26/17 1455 04/29/17 0446  WBC 14.4* 13.1*  HGB 9.2* 9.8*  HCT 29.6* 30.9*  MCV 84.3 86.1  PLT 456* 413    Cardiac Enzymes: No results for input(s): CKTOTAL, CKMB, CKMBINDEX, TROPONINI in the last 168  hours.  BNP: Invalid input(s): POCBNP  CBG:  Recent Labs Lab 04/25/17 1122 04/25/17 1154  GLUCAP 77 80    Microbiology: Results for orders placed or performed during the hospital encounter of 04/21/17  MRSA PCR Screening     Status: Abnormal   Collection Time: 05/01/17  3:00 PM  Result Value Ref Range Status   MRSA by PCR POSITIVE (A) NEGATIVE Final    Comment:        The GeneXpert MRSA Assay (FDA approved for NASAL specimens only), is one component of a comprehensive MRSA colonization surveillance program. It is not intended to diagnose MRSA infection nor to guide or monitor treatment for MRSA infections. RESULT CALLED TO, READ BACK BY AND VERIFIED WITH: JOSH SIMSER AT 1640 05/01/2017 BY TFK.     Coagulation Studies: No results for input(s): LABPROT, INR in the last 72 hours.  Urinalysis: No results for input(s): COLORURINE, LABSPEC, PHURINE, GLUCOSEU, HGBUR, BILIRUBINUR, KETONESUR, PROTEINUR, UROBILINOGEN, NITRITE, LEUKOCYTESUR in the last 72 hours.  Invalid input(s): APPERANCEUR    Imaging: No results found.   Medications:    . amLODipine  10 mg Oral Daily  . apixaban  5 mg Oral BID  . bisoprolol  10 mg Oral Daily  . buPROPion  100 mg  Oral BID  . collagenase  1 application Topical Daily  . docusate sodium  100 mg Oral BID  . epoetin (EPOGEN/PROCRIT) injection  10,000 Units Intravenous Q M,W,F-HD  . feeding supplement (NEPRO CARB STEADY)  237 mL Oral TID BM  . fentaNYL  12.5 mcg Transdermal Q72H  . ferrous sulfate  325 mg Oral Q1200  . heparin  5 mL Intravenous Once per day on Mon Wed Fri  . irbesartan  300 mg Oral QHS  . levothyroxine  75 mcg Oral QAC breakfast  . multivitamin  1 tablet Oral QHS  . nystatin   Topical BID  . sertraline  50 mg Oral Daily  . torsemide  40 mg Oral Daily   acetaminophen **OR** acetaminophen, HYDROcodone-acetaminophen, morphine injection, ondansetron **OR** ondansetron (ZOFRAN) IV  Assessment/ Plan:  [name] is a 36 y.o. white male with hypertension, morbid obesity, pulmonary hypertension, hypothyroidism, hyperlipidemia, depression/anxiety, coronary artery disease, congestive heart failure, who was admitted to [facility] on 03/30/2017  1.  ESRD. (Initial HD on 04/02/17) -24 urine cr clearance is 5cc/min - Exact etiology of end-stage renal disease never determined as the patient was unable to undergo renal biopsy both here as well as [facility].  -  His outpatient dialysis spot has been secured at [facility].  He will be going to dialysis on Tuesday, Thursday, Saturday.  2. Anasarca, with hypertension, proteinuria. Echocardiogram with normal systolic function 8/18. No visualization of hepatic cirrhosis on CT abd/pelvis Status post large volume paracentesis on 9/5 for 7 liters. Left thoracentesis on 8/27 - Patient has had significant ultrafiltration performed.  3. Anemia of chronic kidney disease:  - Patient may be started on erythropoietin stimulating agents as an outpatient.  4. Generalized weakness with muscle wasting. No muscle biopsy performed.  Neurology and rheumatology evaluation performed. - holding statin - PT / OT - sat in chair for HD for 3.5 hrs -  Painful lesions on bilateral lower extremities.  Appreciate input from dermatology. He has undergone biopsy of the skin lesions. These could include vasculitis versus calciphylaxis. Further plan once results from biopsy available.  LOS: 11 Jalayia Bagheri 9/19/201810:00 AM

## 2017-05-04 LAB — MISC LABCORP TEST (SEND OUT): Labcorp test code: 1479

## 2017-05-04 NOTE — Progress Notes (Addendum)
Brief Nutrition Note  RD received vitamin C/ascorbic acid lab results today; confirmed suspected deficiency. Lab significant for possible scurvy. Contacted [facility] MD to notify him of results. Recommend  Vitamin C BID, Renal MVI daily, Zinc  daily for 10days, then  daily afterwards, Copper  daily to prevent deficiency from zinc supplementation. Recommend recheck labs in 1 month. Contacted MD Dasher to notify him of lab results.   Betsey Holiday MS, RD, LDN Pager #- [phone] After Hours Pager: [phone]

## 2017-05-17 LAB — ACID FAST SMEAR (AFB, MYCOBACTERIA): Acid Fast Smear: NEGATIVE

## 2017-05-17 LAB — ACID FAST SMEAR (AFB)

## 2017-06-04 ENCOUNTER — Emergency Department
Admission: EM | Admit: 2017-06-04 | Discharge: 2017-06-04 | Disposition: A | Discharge status: Skilled Nursing Facility | Payer: Medicaid Other | Attending: Emergency Medicine | Admitting: Emergency Medicine

## 2017-06-04 DIAGNOSIS — Z992 Dependence on renal dialysis: Secondary | ICD-10-CM | POA: Insufficient documentation

## 2017-06-04 DIAGNOSIS — R531 Weakness: Secondary | ICD-10-CM | POA: Diagnosis not present

## 2017-06-04 DIAGNOSIS — E039 Hypothyroidism, unspecified: Secondary | ICD-10-CM | POA: Insufficient documentation

## 2017-06-04 DIAGNOSIS — Z7722 Contact with and (suspected) exposure to environmental tobacco smoke (acute) (chronic): Secondary | ICD-10-CM | POA: Diagnosis not present

## 2017-06-04 DIAGNOSIS — Z79899 Other long term (current) drug therapy: Secondary | ICD-10-CM | POA: Diagnosis not present

## 2017-06-04 DIAGNOSIS — I129 Hypertensive chronic kidney disease with stage 1 through stage 4 chronic kidney disease, or unspecified chronic kidney disease: Secondary | ICD-10-CM | POA: Diagnosis not present

## 2017-06-04 DIAGNOSIS — N189 Chronic kidney disease, unspecified: Secondary | ICD-10-CM | POA: Insufficient documentation

## 2017-06-04 DIAGNOSIS — R400 Somnolence: Secondary | ICD-10-CM | POA: Diagnosis present

## 2017-06-04 LAB — CBC WITH DIFFERENTIAL/PLATELET
Basophils Absolute: 0.2 10*3/uL — ABNORMAL HIGH (ref 0–0.1)
Basophils Relative: 1 %
EOS ABS: 0.1 10*3/uL (ref 0–0.7)
EOS PCT: 0 %
HCT: 34.5 % — ABNORMAL LOW (ref 40.0–52.0)
Hemoglobin: 10.5 g/dL — ABNORMAL LOW (ref 13.0–18.0)
LYMPHS ABS: 0.7 10*3/uL — AB (ref 1.0–3.6)
Lymphocytes Relative: 5 %
MCH: 25.6 pg — AB (ref 26.0–34.0)
MCHC: 30.5 g/dL — ABNORMAL LOW (ref 32.0–36.0)
MCV: 84 fL (ref 80.0–100.0)
MONO ABS: 1.1 10*3/uL — AB (ref 0.2–1.0)
Monocytes Relative: 8 %
Neutro Abs: 12.3 10*3/uL — ABNORMAL HIGH (ref 1.4–6.5)
Neutrophils Relative %: 86 %
PLATELETS: 594 10*3/uL — AB (ref 150–440)
RBC: 4.11 MIL/uL — AB (ref 4.40–5.90)
RDW: 18 % — AB (ref 11.5–14.5)
WBC: 14.4 10*3/uL — AB (ref 3.8–10.6)

## 2017-06-04 LAB — COMPREHENSIVE METABOLIC PANEL WITH GFR
ALT: 25 U/L (ref 17–63)
AST: 16 U/L (ref 15–41)
Albumin: 2.2 g/dL — ABNORMAL LOW (ref 3.5–5.0)
Alkaline Phosphatase: 172 U/L — ABNORMAL HIGH (ref 38–126)
Anion gap: 11 (ref 5–15)
BUN: 39 mg/dL — ABNORMAL HIGH (ref 6–20)
CO2: 30 mmol/L (ref 22–32)
Calcium: 8.7 mg/dL — ABNORMAL LOW (ref 8.9–10.3)
Chloride: 99 mmol/L — ABNORMAL LOW (ref 101–111)
Creatinine, Ser: 3.27 mg/dL — ABNORMAL HIGH (ref 0.61–1.24)
GFR calc Af Amer: 27 mL/min — ABNORMAL LOW
GFR calc non Af Amer: 23 mL/min — ABNORMAL LOW
Glucose, Bld: 80 mg/dL (ref 65–99)
Potassium: 3.8 mmol/L (ref 3.5–5.1)
Sodium: 140 mmol/L (ref 135–145)
Total Bilirubin: 0.7 mg/dL (ref 0.3–1.2)
Total Protein: 7.4 g/dL (ref 6.5–8.1)

## 2017-06-04 NOTE — Discharge Instructions (Signed)
Please seek medical attention for any high fevers, chest pain, shortness of breath, change in behavior, persistent vomiting, bloody stool or any other new or concerning symptoms.  

## 2017-06-04 NOTE — ED Notes (Signed)
Second attempt to call [facility] with no answer.

## 2017-06-04 NOTE — ED Notes (Signed)
Patient placed on bedpan and given call bell.

## 2017-06-04 NOTE — ED Notes (Addendum)
Attempted to call [facility] with no answer and no voicemail option. Will try back.

## 2017-06-04 NOTE — ED Provider Notes (Signed)
[facility] Emergency Department Provider Note   ____________________________________________   I have reviewed the triage vital signs and the nursing notes.   HISTORY  Chief Complaint Weakness   History limited by and level 5 caveat due to: Somnelence   HPI [name] is a 36 y.o. male who presents to the emergency department today from dialysis because of concern for somnolence.  DURATION:~ 1 week TIMING: constant SEVERITY: falls asleep easily CONTEXT: patient started dialysis a few months ago. States for the past week or so he has been feeling poorly. He has been fatigued. He is unsure why he had kidney failure. MODIFYING FACTORS: none identified ASSOCIATED SYMPTOMS: patient states he has also had pain but is unable to describe where he hurts  Per medical record review patient has a history of ARF, AKI. Started dialysis a couple of months ago. Per nephrology note it appears he had a nephrotic type syndrome of unclear etiology.  Past Medical History:  Diagnosis Date  . CKD (chronic kidney disease)   . Depression   . Diastolic CHF (HCC)   . Generalized anxiety disorder   . Hyperlipidemia   . Hypertension   . Hypertension   . Hypothyroidism   . Lymphedema   . Nephrotic syndrome   . Obesity   . Pulmonary hypertension [name])     Patient Active Problem List   Diagnosis Date Noted  . Protein-calorie malnutrition, severe 04/30/2017  . 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 Acute on chronic respiratory failure with hypoxia and hypercapnia (HCC)   . Pleural effusion, bilateral   . Postprocedural pneumothorax   . ARF (acute renal failure) (HCC) 03/30/2017    Past Surgical History:  Procedure Laterality Date  . CARDIAC CATHETERIZATION    . DIALYSIS/PERMA CATHETER INSERTION N/A 04/02/2017   Procedure: DIALYSIS/PERMA CATHETER INSERTION;  Surgeon: Annice Needy, MD;  Location: [facility] INVASIVE CV LAB;  Service: Cardiovascular;  Laterality:  N/A;  . DIALYSIS/PERMA CATHETER INSERTION N/A 04/25/2017   Procedure: DIALYSIS/PERMA CATHETER INSERTION;  Surgeon: Annice Needy, MD;  Location: [facility] INVASIVE CV LAB;  Service: Cardiovascular;  Laterality: N/A;    Prior to Admission medications   Medication Sig Start Date End Date Taking? Authorizing Provider  amLODipine (NORVASC) 5 MG tablet Take 5 mg by mouth daily.    [provider]  apixaban (ELIQUIS) 5 MG TABS tablet Take 1 tablet (5 mg total) by mouth 2 (two) times daily. 05/02/17   Alford Highland, MD  bisoprolol (ZEBETA) 10 MG tablet Take 10 mg by mouth daily.    [provider]  buPROPion (WELLBUTRIN) 100 MG tablet Take 100 mg by mouth 2 (two) times daily.    [provider]  collagenase (SANTYL) ointment Apply 1 application topically daily.    [provider]  docusate sodium (COLACE) 100 MG capsule Take 1 capsule (100 mg total) by mouth 2 (two) times daily. Patient not taking: Reported on 04/24/2017 04/13/17   Shaune Pollack, MD  epoetin alfa (EPOGEN,PROCRIT) 2000 UNIT/ML injection 4,000 Units 3 (three) times a week.    [provider]  fentaNYL (DURAGESIC - DOSED MCG/HR) 12 MCG/HR Place 1 patch (12.5 mcg total) onto the skin every 3 (three) days. 05/04/17   Alford Highland, MD  ferrous sulfate 325 (65 FE) MG tablet Take 1 tablet (325 mg total) by mouth daily at 12 noon. Patient not taking: Reported on 04/24/2017 04/14/17   Shaune Pollack, MD  irbesartan (AVAPRO) 300 MG tablet Take  1 tablet (300 mg total) by mouth at bedtime. Patient not taking: Reported on 04/24/2017 04/13/17   Shaune Pollack, MD  levothyroxine (SYNTHROID, LEVOTHROID) 75 MCG tablet Take 75 mcg by mouth daily before breakfast.    [provider]  multivitamin (RENA-VIT) TABS tablet Take 1 tablet by mouth at bedtime. 04/13/17   Shaune Pollack, MD  Nutritional Supplements (FEEDING SUPPLEMENT, NEPRO CARB STEADY,) LIQD Take 237 mLs by mouth 3 (three) times daily between meals. 05/02/17    Alford Highland, MD  nystatin (MYCOSTATIN/NYSTOP) powder Apply topically 2 (two) times daily.    [provider]  ondansetron (ZOFRAN) 8 MG tablet Take by mouth every 8 (eight) hours as needed for nausea or vomiting.    [provider]  oxycodone (OXY-IR) 5 MG capsule 5 mg every four hours for moderate pain; 10 mg every four hours for sever pain 05/02/17   Alford Highland, MD  sertraline (ZOLOFT) 50 MG tablet Take 50 mg by mouth daily.    [provider]  torsemide (DEMADEX) 20 MG tablet Take 2 tablets (40 mg total) by mouth daily. 05/02/17   Alford Highland, MD    Allergies Patient has no known allergies.  Family History  Problem Relation Age of Onset  . Hypertension Mother     Social History Social History  Substance Use Topics  . Smoking status: Passive Smoke Exposure - Never Smoker    Types: Cigarettes  . Smokeless tobacco: Never Used  . Alcohol use No    Review of Systems Constitutional: No fever/chills Eyes: No visual changes. ENT: No sore throat. Cardiovascular: Denies chest pain. Respiratory: Denies shortness of breath. Gastrointestinal: No abdominal pain.  No nausea, no vomiting.  No diarrhea.   Genitourinary: Negative for dysuria. Musculoskeletal: Negative for back pain. Skin: Negative for rash. Neurological: Negative for headaches, focal weakness or numbness.  ____________________________________________   PHYSICAL EXAM:  VITAL SIGNS: ED Triage Vitals  Enc Vitals Group     BP 06/04/17 1725 116/72     Pulse Rate 06/04/17 1725 94     Resp 06/04/17 1725 18     Temp 06/04/17 1725 (!) 97.4 F (36.3 C)     Temp Source 06/04/17 1725 Oral     SpO2 06/04/17 1725 100 %     Weight --      Height --      Head Circumference --      Peak Flow --      Pain Score 06/04/17 1724 8   Constitutional: Somnolent, however wakes to voice. No acute distress. Eyes: Conjunctivae are normal.  ENT   Head: Normocephalic and atraumatic.    Nose: No congestion/rhinnorhea.   Mouth/Throat: Mucous membranes are moist.   Neck: No stridor. Hematological/Lymphatic/Immunilogical: No cervical lymphadenopathy. Cardiovascular: Normal rate, regular rhythm.  No murmurs, rubs, or gallops.  Respiratory: Normal respiratory effort without tachypnea nor retractions. Breath sounds are clear and equal bilaterally. No wheezes/rales/rhonchi. Gastrointestinal: Soft and non tender. No rebound. No guarding.  Genitourinary: Deferred Musculoskeletal: Normal range of motion in all extremities. No lower extremity edema. Neurologic:  Normal speech and language. No gross focal neurologic deficits are appreciated.  Skin:  Skin is warm, dry and intact. No rash noted. Psychiatric: Mood and affect are normal. Speech and behavior are normal. Patient exhibits appropriate insight and judgment.  ____________________________________________    LABS (pertinent positives/negatives)  CBC wbc 14.4, hgb 10.5 CMP cr 3.27, k 3.8 ____________________________________________   EKG  I, Phineas Semen, attending physician, personally viewed and interpreted this  EKG  EKG Time: 1732 Rate: 87 Rhythm: sinus rhythm Axis: right axis deviation Intervals: qtc 419 QRS: borderline IVCD ST changes: no st elevation Impression: abnormal   ____________________________________________    RADIOLOGY  None  ____________________________________________   PROCEDURES  Procedures  ____________________________________________   INITIAL IMPRESSION / ASSESSMENT AND PLAN / ED COURSE  Pertinent labs & imaging results that were available during my care of the patient were reviewed by me and considered in my medical decision making (see chart for details).  Patient presents to the emergency department today because of concerns for weakness. Anemia, electrolyte abnormality, significant infection would be on the differential monks other etiologies. Workup shows a  slight leukocytosis however this appears to be patient's baseline. Sodium potassium are within normal limits. Patient did perk up and felt better during his stay here in the emergency department. He felt comfortable going home. Discussed importance with patient of following up with primary care physician. __________________________________________   FINAL CLINICAL IMPRESSION(S) / ED DIAGNOSES  Final diagnoses:  Weakness     Note: This dictation was prepared with Dragon dictation. Any transcriptional errors that result from this process are unintentional     Phineas SemenGoodman, Ludell Zacarias, MD 06/04/17 2125

## 2017-06-04 NOTE — ED Triage Notes (Signed)
Pt arrives via [facility] from dialysis [place] [address] for "hypertension per dialysis." EMS reports dialysis stated that pt's BP was higher than it normally is. EMS reports pt VS WNL. CBG 96. PT c/o leg pain d/t sores on legs. Pt alert and oriented. EMS states pt is from [facility]. Uses a wheelchair to get around. EMS reports pt was hard to arouse at dialysis. Pt only c/o weakness. Pt appears pale. Denies CP, SOB, N&V.

## 2017-06-21 LAB — ACID FAST CULTURE WITH REFLEXED SENSITIVITIES (MYCOBACTERIA): Acid Fast Culture: NEGATIVE

## 2017-06-21 LAB — ACID FAST CULTURE WITH REFLEXED SENSITIVITIES

## 2017-06-23 ENCOUNTER — Emergency Department: Payer: Medicaid Other

## 2017-06-23 ENCOUNTER — Other Ambulatory Visit: Payer: Self-pay

## 2017-06-23 ENCOUNTER — Emergency Department
Admission: EM | Admit: 2017-06-23 | Discharge: 2017-06-23 | Disposition: A | Discharge status: Home / Self Care | Payer: Medicaid Other | Attending: Emergency Medicine | Admitting: Emergency Medicine

## 2017-06-23 DIAGNOSIS — Z992 Dependence on renal dialysis: Secondary | ICD-10-CM | POA: Diagnosis not present

## 2017-06-23 DIAGNOSIS — R4182 Altered mental status, unspecified: Secondary | ICD-10-CM | POA: Diagnosis not present

## 2017-06-23 DIAGNOSIS — I129 Hypertensive chronic kidney disease with stage 1 through stage 4 chronic kidney disease, or unspecified chronic kidney disease: Secondary | ICD-10-CM | POA: Insufficient documentation

## 2017-06-23 DIAGNOSIS — R0602 Shortness of breath: Secondary | ICD-10-CM | POA: Insufficient documentation

## 2017-06-23 DIAGNOSIS — N189 Chronic kidney disease, unspecified: Secondary | ICD-10-CM | POA: Insufficient documentation

## 2017-06-23 DIAGNOSIS — E039 Hypothyroidism, unspecified: Secondary | ICD-10-CM | POA: Diagnosis not present

## 2017-06-23 DIAGNOSIS — Z7722 Contact with and (suspected) exposure to environmental tobacco smoke (acute) (chronic): Secondary | ICD-10-CM | POA: Insufficient documentation

## 2017-06-23 DIAGNOSIS — Z79899 Other long term (current) drug therapy: Secondary | ICD-10-CM | POA: Insufficient documentation

## 2017-06-23 DIAGNOSIS — R531 Weakness: Secondary | ICD-10-CM | POA: Diagnosis present

## 2017-06-23 LAB — URINALYSIS, COMPLETE (UACMP) WITH MICROSCOPIC
BILIRUBIN URINE: NEGATIVE
HGB URINE DIPSTICK: NEGATIVE
Ketones, ur: NEGATIVE mg/dL
LEUKOCYTES UA: NEGATIVE
Nitrite: NEGATIVE
Protein, ur: >=300 mg/dL — AB
SPECIFIC GRAVITY, URINE: 1.018 (ref 1.005–1.030)
pH: 5 (ref 5.0–8.0)

## 2017-06-23 LAB — CBC
HCT: 34.2 % — ABNORMAL LOW (ref 40.0–52.0)
HEMOGLOBIN: 10.5 g/dL — AB (ref 13.0–18.0)
MCH: 24.8 pg — AB (ref 26.0–34.0)
MCHC: 30.7 g/dL — ABNORMAL LOW (ref 32.0–36.0)
MCV: 80.8 fL (ref 80.0–100.0)
PLATELETS: 509 10*3/uL — AB (ref 150–440)
RBC: 4.23 MIL/uL — AB (ref 4.40–5.90)
RDW: 19.6 % — ABNORMAL HIGH (ref 11.5–14.5)
WBC: 10.5 10*3/uL (ref 3.8–10.6)

## 2017-06-23 LAB — BASIC METABOLIC PANEL
ANION GAP: 11 (ref 5–15)
BUN: 44 mg/dL — ABNORMAL HIGH (ref 6–20)
CHLORIDE: 98 mmol/L — AB (ref 101–111)
CO2: 28 mmol/L (ref 22–32)
CREATININE: 3.99 mg/dL — AB (ref 0.61–1.24)
Calcium: 8.4 mg/dL — ABNORMAL LOW (ref 8.9–10.3)
GFR calc non Af Amer: 18 mL/min — ABNORMAL LOW (ref >60)
GFR, EST AFRICAN AMERICAN: 21 mL/min — AB (ref >60)
Glucose, Bld: 110 mg/dL — ABNORMAL HIGH (ref 65–99)
Potassium: 4.1 mmol/L (ref 3.5–5.1)
SODIUM: 137 mmol/L (ref 135–145)

## 2017-06-23 LAB — TROPONIN I: Troponin I: <0.03 ng/mL (ref <0.03)

## 2017-06-23 LAB — TSH: TSH: 9.947 u[IU]/mL — ABNORMAL HIGH (ref 0.350–4.500)

## 2017-06-23 MED ORDER — OXYCODONE-ACETAMINOPHEN 5-325 MG PO TABS
1.0000 | ORAL_TABLET | Freq: Once | ORAL | Status: AC
  Administered 2017-06-23: 1 via ORAL
  Filled 2017-06-23: qty 1

## 2017-06-23 NOTE — ED Triage Notes (Signed)
Pt came to ED via EMS from dialysis. From [facility]. EMS got called out for sob. Pt denies currently. Pt always on 4L. Took 4L off at dialysis. Missed dialysis treatment Thursday

## 2017-06-23 NOTE — ED Notes (Signed)
Attempted to call report to [facility], unable to get a hold of anyone to let them know pt being transported back

## 2017-06-23 NOTE — ED Provider Notes (Signed)
[facility] Emergency Department Provider Note  ____________________________________________   First MD Initiated Contact with Patient 06/23/17 1128     (approximate)  I have reviewed the triage vital signs and the nursing notes.   HISTORY  Chief Complaint Weakness   HPI [name] is a 36 y.o. male with history of chronic kidney disease on dialysis who is presenting to the emergency department today with weakness and shortness of breath.  However, he denies any shortness of breath.  The shortness of breath was the chief complaint sent in by EMS that they received from dialysis.  Patient is very sleepy and unable to give a detailed history.  I reviewed his previous notes and it appears that in late October he had a very similar visit except that he "perked up."  Patient denying any pain at this time.    Past Medical History:  Diagnosis Date  . CKD (chronic kidney disease)   . Depression   . Diastolic CHF (HCC)   . Generalized anxiety disorder   . Hyperlipidemia   . Hypertension   . Hypertension   . Hypothyroidism   . Lymphedema   . Nephrotic syndrome   . Obesity   . Pulmonary hypertension [facility](HCC)     Patient Active Problem List   Diagnosis Date Noted  . Protein-calorie malnutrition, severe 04/30/2017  . 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 Acute on chronic respiratory failure with hypoxia and hypercapnia (HCC)   . Pleural effusion, bilateral   . Postprocedural pneumothorax   . ARF (acute renal failure) (HCC) 03/30/2017    Past Surgical History:  Procedure Laterality Date  . CARDIAC CATHETERIZATION      Prior to Admission medications   Medication Sig Start Date End Date Taking? Authorizing Provider  amLODipine (NORVASC) 5 MG tablet Take 5 mg by mouth daily.    [provider]  apixaban (ELIQUIS) 5 MG TABS tablet Take 1 tablet (5 mg total) by mouth 2 (two) times daily. 05/02/17   Alford HighlandWieting, Richard, MD  bisoprolol (ZEBETA)  10 MG tablet Take 10 mg by mouth daily.    [provider]  buPROPion (WELLBUTRIN) 100 MG tablet Take 100 mg by mouth 2 (two) times daily.    [provider]  collagenase (SANTYL) ointment Apply 1 application topically daily.    [provider]  docusate sodium (COLACE) 100 MG capsule Take 1 capsule (100 mg total) by mouth 2 (two) times daily. Patient not taking: Reported on 04/24/2017 04/13/17   Shaune Pollackhen, Qing, MD  epoetin alfa (EPOGEN,PROCRIT) 2000 UNIT/ML injection 4,000 Units 3 (three) times a week.    [provider]  fentaNYL (DURAGESIC - DOSED MCG/HR) 12 MCG/HR Place 1 patch (12.5 mcg total) onto the skin every 3 (three) days. 05/04/17   Alford HighlandWieting, Richard, MD  ferrous sulfate 325 (65 FE) MG tablet Take 1 tablet (325 mg total) by mouth daily at 12 noon. Patient not taking: Reported on 04/24/2017 04/14/17   Shaune Pollackhen, Qing, MD  irbesartan (AVAPRO) 300 MG tablet Take 1 tablet (300 mg total) by mouth at bedtime. Patient not taking: Reported on 04/24/2017 04/13/17   Shaune Pollackhen, Qing, MD  levothyroxine (SYNTHROID, LEVOTHROID) 75 MCG tablet Take 75 mcg by mouth daily before breakfast.    [provider]  multivitamin (RENA-VIT) TABS tablet Take 1 tablet by mouth at bedtime. 04/13/17   Shaune Pollackhen, Qing, MD  Nutritional Supplements (FEEDING SUPPLEMENT, NEPRO CARB STEADY,) LIQD Take 237 mLs by mouth 3 (three)  times daily between meals. 05/02/17   Alford HighlandWieting, Richard, MD  nystatin (MYCOSTATIN/NYSTOP) powder Apply topically 2 (two) times daily.    [provider]  ondansetron (ZOFRAN) 8 MG tablet Take by mouth every 8 (eight) hours as needed for nausea or vomiting.    [provider]  oxycodone (OXY-IR) 5 MG capsule 5 mg every four hours for moderate pain; 10 mg every four hours for sever pain 05/02/17   Alford HighlandWieting, Richard, MD  sertraline (ZOLOFT) 50 MG tablet Take 50 mg by mouth daily.    [provider]  torsemide (DEMADEX) 20 MG tablet Take 2 tablets (40 mg total) by  mouth daily. 05/02/17   Alford HighlandWieting, Richard, MD    Allergies Patient has no known allergies.  Family History  Problem Relation Age of Onset  . Hypertension Mother     Social History Social History   Tobacco Use  . Smoking status: Passive Smoke Exposure - Never Smoker  . Smokeless tobacco: Never Used  Substance Use Topics  . Alcohol use: No  . Drug use: Not on file    Review of Systems  Constitutional: No fever/chills Eyes: No visual changes. ENT: No sore throat. Cardiovascular: Denies chest pain. Respiratory: Denies shortness of breath. Gastrointestinal: No abdominal pain.  No nausea, no vomiting.  No diarrhea.  No constipation. Genitourinary: Negative for dysuria. Musculoskeletal: Negative for back pain. Skin: Negative for rash. Neurological: Negative for headaches, focal weakness or numbness.   ____________________________________________   PHYSICAL EXAM:  VITAL SIGNS: ED Triage Vitals [06/23/17 1112]  Enc Vitals Group     BP (!) 136/97     Pulse Rate 88     Resp 16     Temp 97.9 F (36.6 C)     Temp Source Oral     SpO2 100 %     Weight 247 lb 2.2 oz (112.1 kg)     Height 5\' 10"  (1.778 m)     Head Circumference      Peak Flow      Pain Score      Pain Loc      Pain Edu?      Excl. in GC?     Constitutional: Patient sleepy but easily aroused. Eyes: Conjunctivae are normal.  Head: Atraumatic. Nose: No congestion/rhinnorhea. Mouth/Throat: Mucous membranes are moist.  Neck: No stridor.   Cardiovascular: Normal rate, regular rhythm. Grossly normal heart sounds.  Right-sided chest permacath is clean dry and intact. Respiratory: Normal respiratory effort.  No retractions. Lungs CTAB. Gastrointestinal: Soft and nontender. No distention.  Musculoskeletal: Bilateral lower extremity's with multiple ulcerated lesions with pus.  The bilateral lower extremity's are also tender to palpation around the sites of the sores.  The sores number about 5-6 per extremity  and average about 4 cm in a circular shape.  They are just through the dermis. Neurologic:  Normal speech and language. No gross focal neurologic deficits are appreciated. Skin:  Skin is warm, dry and intact. No rash noted. Psychiatric: Mood and affect are normal. Speech and behavior are normal.  ____________________________________________   LABS (all labs ordered are listed, but only abnormal results are displayed)  Labs Reviewed  BASIC METABOLIC PANEL - Abnormal; Notable for the following components:      Result Value   Chloride 98 (*)    Glucose, Bld 110 (*)    BUN 44 (*)    Creatinine, Ser 3.99 (*)    Calcium 8.4 (*)    GFR calc non Af Amer 18 (*)  GFR calc Af Amer 21 (*)    All other components within normal limits  CBC - Abnormal; Notable for the following components:   RBC 4.23 (*)    Hemoglobin 10.5 (*)    HCT 34.2 (*)    MCH 24.8 (*)    MCHC 30.7 (*)    RDW 19.6 (*)    Platelets 509 (*)    All other components within normal limits  URINALYSIS, COMPLETE (UACMP) WITH MICROSCOPIC - Abnormal; Notable for the following components:   Color, Urine YELLOW (*)    APPearance CLOUDY (*)    Glucose, UA >=500 (*)    Protein, ur >=300 (*)    Bacteria, UA RARE (*)    Squamous Epithelial / LPF 0-5 (*)    All other components within normal limits  TROPONIN I  CBG MONITORING, ED   ____________________________________________  EKG  ED ECG REPORT I, Arelia Longest, the attending physician, personally viewed and interpreted this ECG.   Date: 06/23/2017  EKG Time: 1114  Rate: 83  Rhythm: normal sinus rhythm  Axis: Right  Intervals:none  ST&T Change: No ST segment elevation or depression.  No abnormal T wave inversion.  ____________________________________________  RADIOLOGY  Head CT without any acute disease. ____________________________________________   PROCEDURES  Procedure(s) performed:   Procedures  Critical Care performed:    ____________________________________________   INITIAL IMPRESSION / ASSESSMENT AND PLAN / ED COURSE  Pertinent labs & imaging results that were available during my care of the patient were reviewed by me and considered in my medical decision making (see chart for details).  Differential diagnosis includes, but is not limited to, alcohol, illicit or prescription medications, or other toxic ingestion; intracranial pathology such as stroke or intracerebral hemorrhage; fever or infectious causes including sepsis; hypoxemia and/or hypercarbia; uremia; trauma; endocrine related disorders such as diabetes, hypoglycemia, and thyroid-related diseases; hypertensive encephalopathy; etc.  As part of my medical decision making, I reviewed the following data within the electronic MEDICAL RECORD NUMBER Notes from prior ED visits  ----------------------------------------- 2:37 PM on 06/23/2017 -----------------------------------------  Patient is awake and alert at this time and says he is feeling much better.  Says that he normally gets tired at dialysis and sometimes even gets chills and sweats.  He is requesting to be discharged at this time.  I recommended that he continue his chronic wound care.  He does take pain meds at home for chronic pain from his lower extremity wounds.  Afebrile.  Normal white blood cell count.  Patient to follow-up as an outpatient.  I reviewed the patient's previous visit and it seemed to be a very similar presentation to today.  I believe the patient will be appropriate for outpatient treatment at this time.      ____________________________________________   FINAL CLINICAL IMPRESSION(S) / ED DIAGNOSES  Altered mental status, resolved    NEW MEDICATIONS STARTED DURING THIS VISIT:  This SmartLink is deprecated. Use AVSMEDLIST instead to display the medication list for a patient.   Note:  This document was prepared using Dragon voice recognition software and may  include unintentional dictation errors.     Myrna Blazer, MD 06/23/17 [phone]

## 2017-06-23 NOTE — ED Notes (Signed)
Pt waiting on EMS for transport. Offered apple sauce and crackers, did not want any. Waiting on dining services to bring my meal trays

## 2017-07-24 ENCOUNTER — Other Ambulatory Visit (INDEPENDENT_AMBULATORY_CARE_PROVIDER_SITE_OTHER): Payer: Self-pay | Admitting: Nephrology

## 2017-07-24 DIAGNOSIS — N186 End stage renal disease: Secondary | ICD-10-CM

## 2017-07-25 ENCOUNTER — Encounter (INDEPENDENT_AMBULATORY_CARE_PROVIDER_SITE_OTHER): Payer: Medicaid Other | Admitting: Vascular Surgery

## 2017-07-25 ENCOUNTER — Encounter (INDEPENDENT_AMBULATORY_CARE_PROVIDER_SITE_OTHER): Payer: Medicaid Other

## 2017-08-22 ENCOUNTER — Other Ambulatory Visit (INDEPENDENT_AMBULATORY_CARE_PROVIDER_SITE_OTHER): Payer: Self-pay | Admitting: Vascular Surgery

## 2017-08-22 ENCOUNTER — Ambulatory Visit (INDEPENDENT_AMBULATORY_CARE_PROVIDER_SITE_OTHER): Payer: Medicaid Other | Admitting: Vascular Surgery

## 2017-08-22 ENCOUNTER — Ambulatory Visit (INDEPENDENT_AMBULATORY_CARE_PROVIDER_SITE_OTHER): Payer: Medicaid Other

## 2017-08-22 ENCOUNTER — Encounter (INDEPENDENT_AMBULATORY_CARE_PROVIDER_SITE_OTHER): Payer: Self-pay | Admitting: Vascular Surgery

## 2017-08-22 VITALS — BP 138/92 | HR 71 | Resp 17 | Ht 71.0 in | Wt 242.7 lb

## 2017-08-22 DIAGNOSIS — I10 Essential (primary) hypertension: Secondary | ICD-10-CM

## 2017-08-22 DIAGNOSIS — N186 End stage renal disease: Secondary | ICD-10-CM

## 2017-08-22 DIAGNOSIS — Z992 Dependence on renal dialysis: Secondary | ICD-10-CM

## 2017-08-22 NOTE — Progress Notes (Signed)
Subjective:    Patient ID: [name], male    DOB: 1981/04/12, 37 y.o.   MRN: [id number] Chief Complaint  Patient presents with  . New Patient (Initial Visit)    eval access placement   Presents as a new patient referred by [facility] for evaluation of a hemodialysis access creation.  The patient is a resident at [facility].  The patient is currently being maintained by a right IJ PermCath without issue.  The patient denies any uremic symptoms.  The patient denies any fever nausea or vomiting.  Patient is right-hand dominant.  The patient underwent a bilateral upper extremity vein mapping which was amenable to a left brachiobasilic AV fistula creation versus a left brachial axillary graft creation.  The patient has not had any previous creation in either upper extremity.   Review of Systems  Constitutional: Negative.   HENT: Negative.   Eyes: Negative.   Respiratory: Negative.   Cardiovascular: Negative.   Gastrointestinal: Negative.   Endocrine: Negative.   Genitourinary:       ESRD  Musculoskeletal: Negative.   Skin: Negative.   Allergic/Immunologic: Negative.   Neurological: Negative.   Hematological: Negative.   Psychiatric/Behavioral: Negative.       Objective:   Physical Exam  Constitutional: He is oriented to person, place, and time. He appears well-developed and well-nourished. No distress.  In Wheelchair  HENT:  Head: Normocephalic and atraumatic.  Eyes: Conjunctivae are normal. Pupils are equal, round, and reactive to light.  Neck: Normal range of motion.  Cardiovascular: Normal rate, regular rhythm, normal heart sounds and intact distal pulses.  Pulses:      Radial pulses are 2+ on the right side, and 2+ on the left side.  Right IJ Permcath: Intact.  No signs of infection.  Pulmonary/Chest: Effort normal and breath sounds normal. No respiratory distress. He has no wheezes. He has no rales.  Abdominal: Soft. Bowel sounds are  normal. He exhibits no distension. There is no tenderness. There is no rebound.  Musculoskeletal: Normal range of motion. He exhibits edema (Mild to moderate bilateral lower extremity edema).  Neurological: He is alert and oriented to person, place, and time.  Skin: Skin is warm and dry. He is not diaphoretic.  Psychiatric: He has a normal mood and affect. His behavior is normal. Judgment and thought content normal.  Vitals reviewed.  BP (!) 138/92 (BP Location: Right Arm)   Pulse 71   Resp 17   Ht 5\' 11"  (1.803 m)   Wt 242 lb 11.2 oz (110.1 kg)   BMI 33.85 kg/m   Past Medical History:  Diagnosis Date  . CKD (chronic kidney disease)   . Depression   . Diastolic CHF (HCC)   . Generalized anxiety disorder   . Hyperlipidemia   . Hypertension   . Hypertension   . Hypothyroidism   . Lymphedema   . Nephrotic syndrome   . Obesity   . Pulmonary hypertension (HCC)    Social History   Socioeconomic History  . Marital status: Single    Spouse name: Not on file  . Number of children: Not on file  . Years of education: Not on file  . Highest education level: Not on file  Social Needs  . Financial resource strain: Not on file  . Food insecurity - worry: Not on file  . Food insecurity - inability: Not on file  . Transportation needs - medical: Not on file  . Transportation needs -  non-medical: Not on file  Occupational History  . Not on file  Tobacco Use  . Smoking status: Passive Smoke Exposure - Never Smoker  . Smokeless tobacco: Never Used  Substance and Sexual Activity  . Alcohol use: No  . Drug use: No  . Sexual activity: Not on file  Other Topics Concern  . Not on file  Social History Narrative   From [facility]   Has a walker   Past Surgical History:  Procedure Laterality Date  . CARDIAC CATHETERIZATION    . DIALYSIS/PERMA CATHETER INSERTION N/A 04/02/2017   Procedure: DIALYSIS/PERMA CATHETER INSERTION;  Surgeon: Annice Needyew, Jason S, MD;  Location: [facility]  INVASIVE CV LAB;  Service: Cardiovascular;  Laterality: N/A;  . DIALYSIS/PERMA CATHETER INSERTION N/A 04/25/2017   Procedure: DIALYSIS/PERMA CATHETER INSERTION;  Surgeon: Annice Needyew, Jason S, MD;  Location: [facility] INVASIVE CV LAB;  Service: Cardiovascular;  Laterality: N/A;   Family History  Problem Relation Age of Onset  . Hypertension Mother    No Known Allergies     Assessment & Plan:  Presents as a new patient referred by [facility] for evaluation of a hemodialysis access creation.  The patient is a resident at [facility].  The patient is currently being maintained by a right IJ PermCath without issue.  The patient denies any uremic symptoms.  The patient denies any fever nausea or vomiting.  Patient is right-hand dominant.  The patient underwent a bilateral upper extremity vein mapping which was amenable to a left brachiobasilic AV fistula creation versus a left brachial axillary graft creation.  The patient has not had any previous creation in either upper extremity.  1. ESRD on dialysis [facility](HCC) - New The patient is not willing to move forward with a two-stage procedure (left brachiobasilic AV fistula) so we will move forward with the creation of a left brachial axillary graft Patient will continue to be maintained by a right IJ PermCath. Procedure, risks and benefits explained to the patient. All questions answered The patient wishes to proceed The patient understands there will be no blood draws, IVs or blood pressures to the left upper extremity  2. Essential hypertension - Stable Encouraged good control as its slows the progression of atherosclerotic disease  Current Outpatient Medications on File Prior to Visit  Medication Sig Dispense Refill  . amLODipine (NORVASC) 5 MG tablet Take 5 mg by mouth daily.    [name]. apixaban (ELIQUIS) 5 MG TABS tablet Take 1 tablet (5 mg total) by mouth 2 (two) times daily. 60 tablet 0  . ascorbic acid (VITAMIN C) 500 MG tablet Take  500 mg by mouth 2 (two) times daily.    [name]. buPROPion (WELLBUTRIN) 100 MG tablet Take 100 mg by mouth 2 (two) times daily.    . collagenase (SANTYL) ointment Apply 1 application topically daily.    . collagenase (SANTYL) ointment Apply topically.    . docusate sodium (COLACE) 100 MG capsule Take 1 capsule (100 mg total) by mouth 2 (two) times daily. 10 capsule 0  . ferrous sulfate 325 (65 FE) MG tablet Take 1 tablet (325 mg total) by mouth daily at 12 noon.  3  . finasteride (PROSCAR) 5 MG tablet Take 5 mg by mouth daily.    [name]. gabapentin (NEURONTIN) 100 MG capsule Take 100 mg by mouth 3 (three) times daily.    [name]. gabapentin (NEURONTIN) 300 MG capsule Take 300 mg by mouth as directed.    [name]. levothyroxine (SYNTHROID, LEVOTHROID) 75 MCG  tablet Take 75 mcg by mouth daily before breakfast.    . losartan (COZAAR) 100 MG tablet Take 100 mg by mouth daily.    . metoprolol tartrate (LOPRESSOR) 25 MG tablet Take 25 mg by mouth 2 (two) times daily.    . multivitamin (RENA-VIT) TABS tablet Take 1 tablet by mouth at bedtime.  0  . Nutritional Supplements (FEEDING SUPPLEMENT, NEPRO CARB STEADY,) LIQD Take 237 mLs by mouth 3 (three) times daily between meals. 90 Can 0  . ondansetron (ZOFRAN) 8 MG tablet Take by mouth every 8 (eight) hours as needed for nausea or vomiting.    [name] oxycodone (OXY-IR) 5 MG capsule 5 mg every four hours for moderate pain; 10 mg every four hours for sever pain 60 capsule 0  . OXYGEN Inhale into the lungs.    . sertraline (ZOLOFT) 50 MG tablet Take 50 mg by mouth daily.    . sevelamer carbonate (RENVELA) 800 MG tablet Take 800 mg by mouth daily.    [name] torsemide (DEMADEX) 20 MG tablet Take 2 tablets (40 mg total) by mouth daily. 30 tablet 0  . traMADol (ULTRAM) 50 MG tablet Take 50 mg by mouth.    . bisoprolol (ZEBETA) 10 MG tablet Take 10 mg by mouth daily.    [name] epoetin alfa (EPOGEN,PROCRIT) 2000 UNIT/ML injection 4,000 Units 3 (three) times a week.    . fentaNYL (DURAGESIC - DOSED  MCG/HR) 12 MCG/HR Place 1 patch (12.5 mcg total) onto the skin every 3 (three) days. (Patient not taking: Reported on 08/22/2017) 10 patch 0  . irbesartan (AVAPRO) 300 MG tablet Take 1 tablet (300 mg total) by mouth at bedtime. (Patient not taking: Reported on 04/24/2017)    . nystatin (MYCOSTATIN/NYSTOP) powder Apply topically 2 (two) times daily.     No current facility-administered medications on file prior to visit.    There are no Patient Instructions on file for this visit. No Follow-up on file.  Rickell Wiehe A Vandana Haman, PA-C

## 2017-08-23 ENCOUNTER — Encounter (INDEPENDENT_AMBULATORY_CARE_PROVIDER_SITE_OTHER): Payer: Self-pay

## 2017-08-27 ENCOUNTER — Other Ambulatory Visit (INDEPENDENT_AMBULATORY_CARE_PROVIDER_SITE_OTHER): Payer: Self-pay | Admitting: Vascular Surgery

## 2017-08-31 ENCOUNTER — Other Ambulatory Visit: Payer: Self-pay

## 2017-08-31 ENCOUNTER — Encounter
Admission: RE | Admit: 2017-08-31 | Discharge: 2017-08-31 | Disposition: A | Discharge status: Home / Self Care | Payer: Medicaid Other | Source: Ambulatory Visit | Attending: Vascular Surgery | Admitting: Vascular Surgery

## 2017-08-31 ENCOUNTER — Other Ambulatory Visit: Payer: Medicaid Other

## 2017-08-31 DIAGNOSIS — Z01812 Encounter for preprocedural laboratory examination: Secondary | ICD-10-CM | POA: Insufficient documentation

## 2017-08-31 DIAGNOSIS — Z0183 Encounter for blood typing: Secondary | ICD-10-CM | POA: Insufficient documentation

## 2017-08-31 DIAGNOSIS — Z01818 Encounter for other preprocedural examination: Secondary | ICD-10-CM | POA: Insufficient documentation

## 2017-08-31 DIAGNOSIS — Z01810 Encounter for preprocedural cardiovascular examination: Secondary | ICD-10-CM | POA: Diagnosis not present

## 2017-08-31 DIAGNOSIS — N186 End stage renal disease: Secondary | ICD-10-CM | POA: Diagnosis not present

## 2017-08-31 HISTORY — DX: Ascorbic acid deficiency: E54

## 2017-08-31 HISTORY — DX: Pleural effusion, not elsewhere classified: J90

## 2017-08-31 HISTORY — DX: Lymphedema, not elsewhere classified: I89.0

## 2017-08-31 HISTORY — DX: Respiratory failure, unspecified with hypoxia: J96.91

## 2017-08-31 HISTORY — DX: Anemia, unspecified: D64.9

## 2017-08-31 HISTORY — DX: Muscle weakness (generalized): M62.81

## 2017-08-31 HISTORY — DX: Dependence on renal dialysis: Z99.2

## 2017-08-31 HISTORY — DX: Sleep apnea, unspecified: G47.30

## 2017-08-31 LAB — BASIC METABOLIC PANEL
ANION GAP: 12 (ref 5–15)
BUN: 38 mg/dL — ABNORMAL HIGH (ref 6–20)
CALCIUM: 8.4 mg/dL — AB (ref 8.9–10.3)
CO2: 27 mmol/L (ref 22–32)
Chloride: 99 mmol/L — ABNORMAL LOW (ref 101–111)
Creatinine, Ser: 4.8 mg/dL — ABNORMAL HIGH (ref 0.61–1.24)
GFR, EST AFRICAN AMERICAN: 17 mL/min — AB (ref >60)
GFR, EST NON AFRICAN AMERICAN: 14 mL/min — AB (ref >60)
Glucose, Bld: 80 mg/dL (ref 65–99)
POTASSIUM: 4.3 mmol/L (ref 3.5–5.1)
Sodium: 138 mmol/L (ref 135–145)

## 2017-08-31 LAB — CBC WITH DIFFERENTIAL/PLATELET
BASOS PCT: 3 %
Basophils Absolute: 0.2 10*3/uL — ABNORMAL HIGH (ref 0–0.1)
Eosinophils Absolute: 0.2 10*3/uL (ref 0–0.7)
Eosinophils Relative: 3 %
HEMATOCRIT: 42.2 % (ref 40.0–52.0)
HEMOGLOBIN: 13.4 g/dL (ref 13.0–18.0)
LYMPHS PCT: 29 %
Lymphs Abs: 1.7 10*3/uL (ref 1.0–3.6)
MCH: 25.2 pg — ABNORMAL LOW (ref 26.0–34.0)
MCHC: 31.8 g/dL — ABNORMAL LOW (ref 32.0–36.0)
MCV: 79.2 fL — ABNORMAL LOW (ref 80.0–100.0)
MONO ABS: 0.6 10*3/uL (ref 0.2–1.0)
Monocytes Relative: 10 %
NEUTROS ABS: 3.2 10*3/uL (ref 1.4–6.5)
Neutrophils Relative %: 55 %
Platelets: 276 10*3/uL (ref 150–440)
RBC: 5.33 MIL/uL (ref 4.40–5.90)
RDW: 21.1 % — AB (ref 11.5–14.5)
WBC: 5.8 10*3/uL (ref 3.8–10.6)

## 2017-08-31 LAB — SURGICAL PCR SCREEN
MRSA, PCR: POSITIVE — AB
STAPHYLOCOCCUS AUREUS: POSITIVE — AB

## 2017-08-31 LAB — PROTIME-INR
INR: 1.09
PROTHROMBIN TIME: 14 s (ref 11.4–15.2)

## 2017-08-31 LAB — APTT: APTT: 39 s — AB (ref 24–36)

## 2017-08-31 LAB — TYPE AND SCREEN
ABO/RH(D): B POS
Antibody Screen: NEGATIVE

## 2017-08-31 NOTE — Pre-Procedure Instructions (Signed)
Faxed report of abnormal labs and +MRSA PCR swab to Dr. Marijean HeathSchnier's office

## 2017-08-31 NOTE — Patient Instructions (Signed)
Your procedure is scheduled WG:NFAOZHon:Friday 09/07/17 Report to Day Surgery. To find out your arrival time please call [phone] between 1PM - 3PM on Thurs. 09/06/17.  Remember: Instructions that are not followed completely may result in serious medical risk, up to and including death, or upon the discretion of your surgeon and anesthesiologist your surgery may need to be rescheduled.     _X__ 1. Do not eat food after midnight the night before your procedure.                 No gum chewing or hard candies. You may drink clear liquids up to 2 hours                 before you are scheduled to arrive for your surgery- DO not drink clear                 liquids within 2 hours of the start of your surgery.                 Clear Liquids include:  water, apple juice without pulp, clear carbohydrate                 drink such as Clearfast of Gartorade, Black Coffee or Tea (Do not add                 anything to coffee or tea).     _X__ 2.  No Alcohol for 24 hours before or after surgery.   ___ 3.  Do Not Smoke or use e-cigarettes For 24 Hours Prior to Your Surgery.                 Do not use any chewable tobacco products for at least 6 hours prior to                 surgery.  ____  4.  Bring all medications with you on the day of surgery if instructed.   __x__  5.  Notify your doctor if there is any change in your medical condition      (cold, fever, infections).     Do not wear jewelry, make-up, hairpins, clips or nail polish. Do not wear lotions, powders, or perfumes. You may wear deodorant. Do not shave 48 hours prior to surgery. Men may shave face and neck. Do not bring valuables to the hospital.    [facility] is not responsible for any belongings or valuables.  Contacts, dentures or bridgework may not be worn into surgery. Leave your suitcase in the car. After surgery it may be brought to your room. For patients admitted to the hospital, discharge time is determined  by your treatment team.   Patients discharged the day of surgery will not be allowed to drive home.   Please read over the following fact sheets that you were given:    __x__ Take these medicines the morning of surgery with A SIP OF WATER:    1. amLODipine (NORVASC) 5 MG tablet  2. buPROPion (WELLBUTRIN) 100 MG tablet  3. gabapentin (NEURONTIN) 100 MG capsule  4.levothyroxine (SYNTHROID, LEVOTHROID) 75 MCG tablet  5.metoprolol tartrate (LOPRESSOR) 25 MG tablet  6.  ____ Fleet Enema (as directed)   __x__ Use Sage Wipes as directed  ____ Use inhalers on the day of surgery  ____ Stop metformin 2 days prior to surgery    ____ Take 1/2 of usual insulin dose the night before surgery. No insulin the morning  of surgery.   _x___ Stop Eloquist 3 days before ( Wed.)  ____ Stop Anti-inflammatories on    _x___ Stop supplements until after surgery.  ascorbic acid (VITAMIN C) 500 MG tablet today  ____ Bring C-Pap to the hospital.

## 2017-09-03 NOTE — Pre-Procedure Instructions (Signed)
Contacted Dr. Marijean HeathSchnier's office received order to dc Ancef order and give Vancomycin 1 gm instead.

## 2017-09-06 MED ORDER — VANCOMYCIN HCL IN DEXTROSE 1-5 GM/200ML-% IV SOLN
1000.0000 mg | Freq: Once | INTRAVENOUS | Status: AC
  Administered 2017-10-05: 1000 mg via INTRAVENOUS

## 2017-09-07 ENCOUNTER — Encounter (INDEPENDENT_AMBULATORY_CARE_PROVIDER_SITE_OTHER): Payer: Self-pay

## 2017-09-20 ENCOUNTER — Encounter (INDEPENDENT_AMBULATORY_CARE_PROVIDER_SITE_OTHER): Payer: Self-pay

## 2017-10-05 ENCOUNTER — Ambulatory Visit: Payer: Medicaid Other | Admitting: Anesthesiology

## 2017-10-05 ENCOUNTER — Ambulatory Visit
Admission: RE | Admit: 2017-10-05 | Discharge: 2017-10-05 | Disposition: A | Discharge status: Home / Self Care | Payer: Medicaid Other | Source: Ambulatory Visit | Attending: Vascular Surgery | Admitting: Vascular Surgery

## 2017-10-05 ENCOUNTER — Encounter
Admission: RE | Disposition: A | Discharge status: Home / Self Care | Payer: Self-pay | Source: Ambulatory Visit | Attending: Vascular Surgery

## 2017-10-05 ENCOUNTER — Encounter: Payer: Self-pay | Admitting: Vascular Surgery

## 2017-10-05 DIAGNOSIS — Z79899 Other long term (current) drug therapy: Secondary | ICD-10-CM | POA: Diagnosis not present

## 2017-10-05 DIAGNOSIS — G4730 Sleep apnea, unspecified: Secondary | ICD-10-CM | POA: Insufficient documentation

## 2017-10-05 DIAGNOSIS — E669 Obesity, unspecified: Secondary | ICD-10-CM | POA: Insufficient documentation

## 2017-10-05 DIAGNOSIS — Z7901 Long term (current) use of anticoagulants: Secondary | ICD-10-CM | POA: Insufficient documentation

## 2017-10-05 DIAGNOSIS — D631 Anemia in chronic kidney disease: Secondary | ICD-10-CM | POA: Diagnosis not present

## 2017-10-05 DIAGNOSIS — I5032 Chronic diastolic (congestive) heart failure: Secondary | ICD-10-CM | POA: Diagnosis not present

## 2017-10-05 DIAGNOSIS — E039 Hypothyroidism, unspecified: Secondary | ICD-10-CM | POA: Diagnosis not present

## 2017-10-05 DIAGNOSIS — F411 Generalized anxiety disorder: Secondary | ICD-10-CM | POA: Insufficient documentation

## 2017-10-05 DIAGNOSIS — N186 End stage renal disease: Secondary | ICD-10-CM | POA: Insufficient documentation

## 2017-10-05 DIAGNOSIS — E1122 Type 2 diabetes mellitus with diabetic chronic kidney disease: Secondary | ICD-10-CM | POA: Insufficient documentation

## 2017-10-05 DIAGNOSIS — Z79891 Long term (current) use of opiate analgesic: Secondary | ICD-10-CM | POA: Insufficient documentation

## 2017-10-05 DIAGNOSIS — F329 Major depressive disorder, single episode, unspecified: Secondary | ICD-10-CM | POA: Insufficient documentation

## 2017-10-05 DIAGNOSIS — Z992 Dependence on renal dialysis: Secondary | ICD-10-CM | POA: Diagnosis not present

## 2017-10-05 DIAGNOSIS — I272 Pulmonary hypertension, unspecified: Secondary | ICD-10-CM | POA: Insufficient documentation

## 2017-10-05 DIAGNOSIS — Z6833 Body mass index (BMI) 33.0-33.9, adult: Secondary | ICD-10-CM | POA: Diagnosis not present

## 2017-10-05 DIAGNOSIS — Z7722 Contact with and (suspected) exposure to environmental tobacco smoke (acute) (chronic): Secondary | ICD-10-CM | POA: Diagnosis not present

## 2017-10-05 DIAGNOSIS — I132 Hypertensive heart and chronic kidney disease with heart failure and with stage 5 chronic kidney disease, or end stage renal disease: Secondary | ICD-10-CM | POA: Insufficient documentation

## 2017-10-05 HISTORY — PX: AV FISTULA PLACEMENT: SHX1204

## 2017-10-05 LAB — POCT I-STAT 4, (NA,K, GLUC, HGB,HCT)
GLUCOSE: 78 mg/dL (ref 65–99)
HEMATOCRIT: 41 % (ref 39.0–52.0)
Hemoglobin: 13.9 g/dL (ref 13.0–17.0)
POTASSIUM: 3.6 mmol/L (ref 3.5–5.1)
Sodium: 141 mmol/L (ref 135–145)

## 2017-10-05 LAB — TYPE AND SCREEN
ABO/RH(D): B POS
Antibody Screen: NEGATIVE

## 2017-10-05 SURGERY — INSERTION OF ARTERIOVENOUS (AV) GORE-TEX GRAFT ARM
Anesthesia: General | Site: Arm Upper | Laterality: Left | Wound class: Clean

## 2017-10-05 MED ORDER — ONDANSETRON HCL 4 MG/2ML IJ SOLN
INTRAMUSCULAR | Status: DC | PRN
  Administered 2017-10-05: 4 mg via INTRAVENOUS

## 2017-10-05 MED ORDER — FAMOTIDINE 20 MG PO TABS
ORAL_TABLET | ORAL | Status: AC
  Administered 2017-10-05: 20 mg via ORAL
  Filled 2017-10-05: qty 1

## 2017-10-05 MED ORDER — HEPARIN SODIUM (PORCINE) 5000 UNIT/ML IJ SOLN
INTRAMUSCULAR | Status: AC
  Filled 2017-10-05: qty 1

## 2017-10-05 MED ORDER — BUPIVACAINE HCL (PF) 0.5 % IJ SOLN
INTRAMUSCULAR | Status: AC
  Filled 2017-10-05: qty 30

## 2017-10-05 MED ORDER — GLYCOPYRROLATE 0.2 MG/ML IJ SOLN
INTRAMUSCULAR | Status: DC | PRN
  Administered 2017-10-05: 0.2 mg via INTRAVENOUS

## 2017-10-05 MED ORDER — GLYCOPYRROLATE 0.2 MG/ML IJ SOLN
INTRAMUSCULAR | Status: AC
  Filled 2017-10-05: qty 1

## 2017-10-05 MED ORDER — FENTANYL CITRATE (PF) 100 MCG/2ML IJ SOLN
INTRAMUSCULAR | Status: AC
  Filled 2017-10-05: qty 2

## 2017-10-05 MED ORDER — PHENYLEPHRINE HCL 10 MG/ML IJ SOLN
INTRAMUSCULAR | Status: DC | PRN
  Administered 2017-10-05 (×3): 100 ug via INTRAVENOUS

## 2017-10-05 MED ORDER — HYDROCODONE-ACETAMINOPHEN 5-325 MG PO TABS: 1.0000 | ORAL_TABLET | Freq: Four times a day (QID) | ORAL | 0 refills | Status: AC | PRN

## 2017-10-05 MED ORDER — DEXAMETHASONE SODIUM PHOSPHATE 10 MG/ML IJ SOLN
INTRAMUSCULAR | Status: AC
  Filled 2017-10-05: qty 1

## 2017-10-05 MED ORDER — MEPERIDINE HCL 50 MG/ML IJ SOLN: 6.2500 mg | INTRAMUSCULAR | Status: DC | PRN

## 2017-10-05 MED ORDER — EPHEDRINE SULFATE 50 MG/ML IJ SOLN
INTRAMUSCULAR | Status: DC | PRN
  Administered 2017-10-05 (×2): 10 mg via INTRAVENOUS

## 2017-10-05 MED ORDER — CHLORHEXIDINE GLUCONATE CLOTH 2 % EX PADS: 6.0000 | MEDICATED_PAD | Freq: Once | CUTANEOUS | Status: DC

## 2017-10-05 MED ORDER — FENTANYL CITRATE (PF) 100 MCG/2ML IJ SOLN
25.0000 ug | INTRAMUSCULAR | Status: DC | PRN
  Administered 2017-10-05 (×2): 50 ug via INTRAVENOUS

## 2017-10-05 MED ORDER — OXYCODONE HCL 5 MG PO TABS
5.0000 mg | ORAL_TABLET | Freq: Once | ORAL | Status: AC | PRN
  Administered 2017-10-05: 5 mg via ORAL

## 2017-10-05 MED ORDER — FENTANYL CITRATE (PF) 100 MCG/2ML IJ SOLN
INTRAMUSCULAR | Status: AC
  Administered 2017-10-05: 50 ug via INTRAVENOUS
  Filled 2017-10-05: qty 2

## 2017-10-05 MED ORDER — LIDOCAINE HCL (CARDIAC) 20 MG/ML IV SOLN
INTRAVENOUS | Status: DC | PRN
  Administered 2017-10-05: 50 mg via INTRAVENOUS

## 2017-10-05 MED ORDER — PROPOFOL 10 MG/ML IV BOLUS
INTRAVENOUS | Status: DC | PRN
  Administered 2017-10-05: 130 mg via INTRAVENOUS

## 2017-10-05 MED ORDER — PROPOFOL 10 MG/ML IV BOLUS
INTRAVENOUS | Status: AC
  Filled 2017-10-05: qty 20

## 2017-10-05 MED ORDER — VANCOMYCIN HCL IN DEXTROSE 1-5 GM/200ML-% IV SOLN
INTRAVENOUS | Status: AC
  Administered 2017-10-05: 1000 mg via INTRAVENOUS
  Filled 2017-10-05: qty 200

## 2017-10-05 MED ORDER — OXYCODONE HCL 5 MG/5ML PO SOLN: 5.0000 mg | Freq: Once | ORAL | Status: AC | PRN

## 2017-10-05 MED ORDER — DEXAMETHASONE SODIUM PHOSPHATE 10 MG/ML IJ SOLN
INTRAMUSCULAR | Status: DC | PRN
  Administered 2017-10-05: 10 mg via INTRAVENOUS

## 2017-10-05 MED ORDER — OXYCODONE HCL 5 MG PO TABS
ORAL_TABLET | ORAL | Status: AC
  Filled 2017-10-05: qty 1

## 2017-10-05 MED ORDER — PHENYLEPHRINE HCL 10 MG/ML IJ SOLN
INTRAMUSCULAR | Status: AC
  Filled 2017-10-05: qty 1

## 2017-10-05 MED ORDER — ONDANSETRON HCL 4 MG/2ML IJ SOLN
INTRAMUSCULAR | Status: AC
  Filled 2017-10-05: qty 2

## 2017-10-05 MED ORDER — PROMETHAZINE HCL 25 MG/ML IJ SOLN: 6.2500 mg | INTRAMUSCULAR | Status: DC | PRN

## 2017-10-05 MED ORDER — SODIUM CHLORIDE 0.9 % IV SOLN
INTRAVENOUS | Status: DC
  Administered 2017-10-05: 07:00:00 via INTRAVENOUS

## 2017-10-05 MED ORDER — EPHEDRINE SULFATE 50 MG/ML IJ SOLN
INTRAMUSCULAR | Status: AC
  Filled 2017-10-05: qty 1

## 2017-10-05 MED ORDER — FAMOTIDINE 20 MG PO TABS
20.0000 mg | ORAL_TABLET | Freq: Once | ORAL | Status: AC
  Administered 2017-10-05: 20 mg via ORAL

## 2017-10-05 MED ORDER — MIDAZOLAM HCL 2 MG/2ML IJ SOLN
INTRAMUSCULAR | Status: DC | PRN
  Administered 2017-10-05: 2 mg via INTRAVENOUS

## 2017-10-05 MED ORDER — MIDAZOLAM HCL 2 MG/2ML IJ SOLN
INTRAMUSCULAR | Status: AC
  Filled 2017-10-05: qty 2

## 2017-10-05 MED ORDER — FENTANYL CITRATE (PF) 100 MCG/2ML IJ SOLN
INTRAMUSCULAR | Status: DC | PRN
  Administered 2017-10-05 (×4): 25 ug via INTRAVENOUS

## 2017-10-05 SURGICAL SUPPLY — 58 items
APPLIER CLIP 11 MED OPEN (CLIP)
APPLIER CLIP 9.375 SM OPEN (CLIP)
BAG COUNTER SPONGE EZ (MISCELLANEOUS) IMPLANT
BAG DECANTER FOR FLEXI CONT (MISCELLANEOUS) ×3 IMPLANT
BLADE SURG SZ11 CARB STEEL (BLADE) ×3 IMPLANT
BOOT SUTURE AID YELLOW STND (SUTURE) ×3 IMPLANT
BRUSH SCRUB EZ  4% CHG (MISCELLANEOUS) ×2
BRUSH SCRUB EZ 4% CHG (MISCELLANEOUS) ×1 IMPLANT
CANISTER SUCT 1200ML W/VALVE (MISCELLANEOUS) ×3 IMPLANT
CHLORAPREP W/TINT 26ML (MISCELLANEOUS) ×3 IMPLANT
CLIP APPLIE 11 MED OPEN (CLIP) IMPLANT
CLIP APPLIE 9.375 SM OPEN (CLIP) IMPLANT
COUNTER SPONGE BAG EZ (MISCELLANEOUS)
DERMABOND ADVANCED (GAUZE/BANDAGES/DRESSINGS) ×2
DERMABOND ADVANCED .7 DNX12 (GAUZE/BANDAGES/DRESSINGS) ×1 IMPLANT
DRESSING SURGICEL FIBRLLR 1X2 (HEMOSTASIS) ×1 IMPLANT
DRSG SURGICEL FIBRILLAR 1X2 (HEMOSTASIS) ×3
ELECT CAUTERY BLADE 6.4 (BLADE) ×3 IMPLANT
ELECT REM PT RETURN 9FT ADLT (ELECTROSURGICAL) ×3
ELECTRODE REM PT RTRN 9FT ADLT (ELECTROSURGICAL) ×1 IMPLANT
GLOVE BIO SURGEON STRL SZ7 (GLOVE) ×30 IMPLANT
GLOVE INDICATOR 7.5 STRL GRN (GLOVE) ×3 IMPLANT
GLOVE SURG SYN 8.0 (GLOVE) ×3 IMPLANT
GOWN STRL REUS W/ TWL LRG LVL3 (GOWN DISPOSABLE) ×2 IMPLANT
GOWN STRL REUS W/ TWL XL LVL3 (GOWN DISPOSABLE) ×1 IMPLANT
GOWN STRL REUS W/TWL LRG LVL3 (GOWN DISPOSABLE) ×4
GOWN STRL REUS W/TWL XL LVL3 (GOWN DISPOSABLE) ×2
GRAFT PROPATEN STD WALL 4 7X45 (Vascular Products) ×3 IMPLANT
IV NS 500ML (IV SOLUTION) ×2
IV NS 500ML BAXH (IV SOLUTION) ×1 IMPLANT
KIT TURNOVER KIT A (KITS) ×3 IMPLANT
LABEL OR SOLS (LABEL) ×3 IMPLANT
LOOP RED MAXI  1X406MM (MISCELLANEOUS) ×2
LOOP VESSEL MAXI 1X406 RED (MISCELLANEOUS) ×1 IMPLANT
LOOP VESSEL MINI 0.8X406 BLUE (MISCELLANEOUS) ×2 IMPLANT
LOOPS BLUE MINI 0.8X406MM (MISCELLANEOUS) ×4
NEEDLE FILTER BLUNT 18X 1/2SAF (NEEDLE) ×2
NEEDLE FILTER BLUNT 18X1 1/2 (NEEDLE) ×1 IMPLANT
NS IRRIG 500ML POUR BTL (IV SOLUTION) ×3 IMPLANT
PACK EXTREMITY ARMC (MISCELLANEOUS) ×3 IMPLANT
PAD PREP 24X41 OB/GYN DISP (PERSONAL CARE ITEMS) IMPLANT
PUNCH SURGICAL ROTATE 2.7MM (MISCELLANEOUS) IMPLANT
STOCKINETTE STRL 4IN 9604848 (GAUZE/BANDAGES/DRESSINGS) ×3 IMPLANT
SUT GTX CV-6 30 (SUTURE) ×6 IMPLANT
SUT MNCRL+ 5-0 UNDYED PC-3 (SUTURE) ×1 IMPLANT
SUT MONOCRYL 5-0 (SUTURE) ×2
SUT PROLENE 6 0 BV (SUTURE) ×6 IMPLANT
SUT SILK 2 0 (SUTURE) ×2
SUT SILK 2 0 SH (SUTURE) ×3 IMPLANT
SUT SILK 2-0 18XBRD TIE 12 (SUTURE) ×1 IMPLANT
SUT SILK 3 0 (SUTURE) ×2
SUT SILK 3-0 18XBRD TIE 12 (SUTURE) ×1 IMPLANT
SUT SILK 4 0 (SUTURE) ×2
SUT SILK 4-0 18XBRD TIE 12 (SUTURE) ×1 IMPLANT
SUT VIC AB 3-0 SH 27 (SUTURE) ×4
SUT VIC AB 3-0 SH 27X BRD (SUTURE) ×2 IMPLANT
SYR 20CC LL (SYRINGE) ×3 IMPLANT
SYR 3ML LL SCALE MARK (SYRINGE) ×3 IMPLANT

## 2017-10-05 NOTE — Anesthesia Preprocedure Evaluation (Signed)
Anesthesia Evaluation  Patient identified by MRN, date of birth, ID band Patient awake    Reviewed: Allergy & Precautions, NPO status , Patient's Chart, lab work & pertinent test results  History of Anesthesia Complications Negative for: history of anesthetic complications  Airway Mallampati: II  TM Distance: >3 FB Neck ROM: Full    Dental no notable dental hx.    Pulmonary sleep apnea and Continuous Positive Airway Pressure Ventilation , neg COPD,    breath sounds clear to auscultation- rhonchi (-) wheezing      Cardiovascular hypertension, Pt. on medications +CHF (diastolic)  (-) CAD, (-) Past MI, (-) Cardiac Stents and (-) CABG  Rhythm:Regular Rate:Normal - Systolic murmurs and - Diastolic murmurs Echo 03/31/17: - Left ventricle: The cavity size was normal. There was mild   concentric hypertrophy. Systolic function was normal. The   estimated ejection fraction was in the range of 50% to 55%. - Aortic valve: There was trivial regurgitation. - Mitral valve: There was mild to moderate regurgitation. - Tricuspid valve: There was mild-moderate regurgitation.   Neuro/Psych PSYCHIATRIC DISORDERS Anxiety Depression    GI/Hepatic negative GI ROS, Neg liver ROS,   Endo/Other  neg diabetesHypothyroidism   Renal/GU ESRF and DialysisRenal disease (last dialysis 10/04/17)     Musculoskeletal negative musculoskeletal ROS (+)   Abdominal (+) + obese,   Peds  Hematology  (+) anemia ,   Anesthesia Other Findings Past Medical History: No date: Anemia No date: CKD (chronic kidney disease) No date: Dependence on renal dialysis (HCC) No date: Depression No date: Diastolic CHF (HCC) No date: Generalized anxiety disorder No date: Hyperlipidemia No date: Hypertension No date: Hypertension No date: Hypothyroidism No date: Lymph edema No date: Lymphedema No date: Muscle weakness (generalized) No date: Nephrotic syndrome No date:  Obesity No date: Pleural effusion No date: Pulmonary hypertension (HCC) No date: Respiratory failure with hypoxia (HCC) No date: Sleep apnea     Comment:  CPAP No date: Vitamin C deficiency   Reproductive/Obstetrics                             Anesthesia Physical Anesthesia Plan  ASA: IV  Anesthesia Plan: General   Post-op Pain Management:    Induction: Intravenous  PONV Risk Score and Plan: 1 and Ondansetron  Airway Management Planned: LMA  Additional Equipment:   Intra-op Plan:   Post-operative Plan:   Informed Consent: I have reviewed the patients History and Physical, chart, labs and discussed the procedure including the risks, benefits and alternatives for the proposed anesthesia with the patient or authorized representative who has indicated his/her understanding and acceptance.   Dental advisory given  Plan Discussed with: CRNA and Anesthesiologist  Anesthesia Plan Comments:         Anesthesia Quick Evaluation

## 2017-10-05 NOTE — Transfer of Care (Signed)
Immediate Anesthesia Transfer of Care Note  Patient: Brian FlackJoseph Laviolette  Procedure(s) Performed: INSERTION OF ARTERIOVENOUS (AV) GORE-TEX GRAFT ARM ( BRACHIAL AXILLARY ) (Left Arm Upper)  Patient Location: PACU  Anesthesia Type:General  Level of Consciousness: drowsy and responds to stimulation  Airway & Oxygen Therapy: Patient Spontanous Breathing and Patient connected to face mask oxygen  Post-op Assessment: Report given to RN and Post -op Vital signs reviewed and stable  Post vital signs: Reviewed and stable  Last Vitals:  Vitals:   10/05/17 0633 10/05/17 1000  BP: 115/80 119/77  Pulse: 69 89  Resp: 17 20  Temp: (!) 36.4 C 36.4 C  SpO2: 99% 99%    Last Pain:  Vitals:   10/05/17 0633  TempSrc: Oral  PainSc: 6          Complications: No apparent anesthesia complications

## 2017-10-05 NOTE — OR Nursing (Signed)
Talked with pt about discharge instructions. Pt voices understanding. Instructed not to get left arm wet for 24 hours after which he may wash arm dry thoroughly no lotion, ointment or dressing. Pt going to dialysis tomorrow.

## 2017-10-05 NOTE — OR Nursing (Signed)
Report given from PACU was no special instructions from Dr Gilda CreaseSchnier. Follow up in 2 weeks scheduled. Prescriptions  Sent with [facility] to [facility]. Report called to [place] [facility].

## 2017-10-05 NOTE — Anesthesia Postprocedure Evaluation (Signed)
Anesthesia Post Note  Patient: [name]  Procedure(s) Performed: INSERTION OF ARTERIOVENOUS (AV) GORE-TEX GRAFT ARM ( BRACHIAL AXILLARY ) (Left Arm Upper)  Patient location during evaluation: PACU Anesthesia Type: General Level of consciousness: awake and alert and oriented Pain management: pain level controlled Vital Signs Assessment: post-procedure vital signs reviewed and stable Respiratory status: spontaneous breathing, nonlabored ventilation and respiratory function stable Cardiovascular status: blood pressure returned to baseline and stable Postop Assessment: no signs of nausea or vomiting Anesthetic complications: no     Last Vitals:  Vitals:   10/05/17 1030 10/05/17 1034  BP:  116/82  Pulse:  92  Resp:  16  Temp:    SpO2: 92% 92%    Last Pain:  Vitals:   10/05/17 1034  TempSrc:   PainSc: 4                  Zayven Powe

## 2017-10-05 NOTE — Discharge Instructions (Signed)

## 2017-10-05 NOTE — Op Note (Signed)
OPERATIVE NOTE   PROCEDURE: left brachial axillary arteriovenous graft placement  PRE-OPERATIVE DIAGNOSIS: End Stage Renal Disease  POST-OPERATIVE DIAGNOSIS: End Stage Renal Disease  SURGEON: Levora Dredge  ASSISTANT(S): Ms Raul Del  ANESTHESIA: general  ESTIMATED BLOOD LOSS: <50 cc  FINDING(S): 5 mm axillary vein  SPECIMEN(S):  none  INDICATIONS:   [name] is a 37 y.o. male who presents with end stage renal disease.  The patient is scheduled for left brachial axillary AV graft placement.  The patient is aware the risks include but are not limited to: bleeding, infection, steal syndrome, nerve damage, ischemic monomelic neuropathy, failure to mature, and need for additional procedures.  The patient is aware of the risks of the procedure and elects to proceed forward.  DESCRIPTION: After full informed written consent was obtained from the patient, the patient was brought back to the operating room and placed supine upon the operating table.  Prior to induction, the patient received IV antibiotics.   After obtaining adequate anesthesia, the patient was then prepped and draped in the standard fashion for a left arm access procedure.    A linear incision was then created along the medial border of the biceps muscle just proximal to the antecubital crease and the brachial artery which was exposed through. The brachial artery was then looped proximally and distally with Silastic Vesseloops. Side branches were controlled with 4-0 silk ties.  Attention was then turned to the exposure of the axillary vein. Linear incision was then created medial to the proximal portion of the biceps at the level of the anterior axillary crease. The axillary vein was exposed and again looped proximally and distally with Silastic vessel loops. Associated tributaries were also controlled with Silastic Vesseloops.  The Gore tunneler was then delivered onto the field and a subcutaneous path was  made from the arterial incision to the venous incision. A 4-7 tapered PTFE propatent graft by Emeline Darling was then pulled through the subcutaneous tunnel. The arterial 4 mm portion was then approximated to the brachial artery. Brachial artery was controlled proximally and distally with the Silastic Vesseloops. Arteriotomy was made with an 11 blade scalpel and extended with Potts scissors and a 6-0 Prolene stay suture was placed. End graft to side brachial artery anastomosis was then fashioned with running CV 6 suture. Flushing maneuvers were performed suture line was hemostatic and the graft was then assessed for proper position and ease of future cannulation. Heparinized saline was infused into the vein and the graft was clamped with a vascular clamp. With the graft pressurized it was approximated to the axillary vein in its native bed and then marked with a surgical marker. The vein was then delivered into the surgical field and controlled with the Silastic vessel loops. Venotomy was then made with an 11 blade scalpel and extended with Potts scissors and a 6-0 Prolene suture was used as stay suture. The the graft was then sewn to the vein in an end graft to side vein fashion using running CV 6 suture.  Flushing maneuvers were performed and the artery was allowed to forward and back bleed.  Flow was then established through the AV graft  There was good  thrill in the venous outflow, and there was 1+ palpable radial pulse.  At this point, I irrigated out the surgical wounds.  There was no further active bleeding.  The subcutaneous tissue was reapproximated with a running stitch of 3-0 Vicryl.  The skin was then reapproximated with a running  subcuticular stitch of 4-0 Vicryl.  The skin was then cleaned, dried, and reinforced with Dermabond.    The patient tolerated this procedure well.   COMPLICATIONS: None  CONDITION: Velna HatchetGood   [facility]  Office: [phone]   10/05/2017, 9:59  AM

## 2017-10-05 NOTE — Anesthesia Post-op Follow-up Note (Signed)
Anesthesia QCDR form completed.        

## 2017-10-05 NOTE — Anesthesia Procedure Notes (Signed)
Procedure Name: LMA Insertion Date/Time: 10/05/2017 7:46 AM Performed by: Omer JackWeatherly, Shawnette Augello, CRNA Pre-anesthesia Checklist: Patient identified, Patient being monitored, Timeout performed, Emergency Drugs available and Suction available Patient Re-evaluated:Patient Re-evaluated prior to induction Oxygen Delivery Method: Circle system utilized Preoxygenation: Pre-oxygenation with 100% oxygen Induction Type: IV induction Ventilation: Mask ventilation without difficulty LMA: LMA inserted LMA Size: 4.5 Tube type: Oral Number of attempts: 1 Placement Confirmation: positive ETCO2 and breath sounds checked- equal and bilateral Tube secured with: Tape Dental Injury: Teeth and Oropharynx as per pre-operative assessment

## 2017-10-05 NOTE — H&P (Signed)
[facility] History & Physical Update  The patient was interviewed and re-examined.  [name] previous History and Physical has been reviewed and is unchanged.  There is no change in the plan of care. We plan to proceed with the scheduled procedure.  Levora DredgeGregory Schnier, MD  10/05/2017, 7:28 AM

## 2017-10-10 NOTE — H&P (Signed)
[facility] VASCULAR & VEIN SPECIALISTS Admission History & Physical  MRN : [id number]  [name] is a 37 y.o. (Sep 01, 1980) male who presents with chief complaint of No chief complaint on file. [name]  History of Present Illness:  The patient is seen for evaluation of dialysis access.  The patient has a history of multiple failed accesses.  There have been accesses in both arms and in the thighs.    Current access is via a catheter which is functioning poorly.  There have been several episodes of catheter infection.  The patient denies amaurosis fugax or recent TIA symptoms. There are no recent neurological changes noted. The patient denies claudication symptoms or rest pain symptoms. The patient denies history of DVT, PE or superficial thrombophlebitis. The patient denies recent episodes of angina or shortness of breath.    No current facility-administered medications for this encounter.    Current Outpatient Medications  Medication Sig Dispense Refill  . amLODipine (NORVASC) 5 MG tablet Take 5 mg by mouth daily.    [name] apixaban (ELIQUIS) 5 MG TABS tablet Take 1 tablet (5 mg total) by mouth 2 (two) times daily. 60 tablet 0  . ascorbic acid (VITAMIN C) 500 MG tablet Take 500 mg by mouth 2 (two) times daily.    [name] buPROPion (WELLBUTRIN) 100 MG tablet Take 150 mg by mouth 2 (two) times daily.     . collagenase (SANTYL) ointment Apply 1 application topically every evening.     . docusate sodium (COLACE) 100 MG capsule Take 1 capsule (100 mg total) by mouth 2 (two) times daily. 10 capsule 0  . ferrous sulfate 325 (65 FE) MG tablet Take 1 tablet (325 mg total) by mouth daily at 12 noon. (Patient taking differently: Take 325 mg by mouth daily. )  3  . finasteride (PROSCAR) 5 MG tablet Take 5 mg by mouth daily. Tues Thurs and Sat evening    . gabapentin (NEURONTIN) 100 MG capsule Take 100-300 mg by mouth See admin instructions. Take 100 mg by mouth in the evening on Tuesday, Thursday and Saturday. Take  300 mg by mouth in the evening on all other days    . levothyroxine (SYNTHROID, LEVOTHROID) 75 MCG tablet Take 75 mcg by mouth daily before breakfast.    . losartan (COZAAR) 100 MG tablet Take 100 mg by mouth at bedtime.     . metoprolol tartrate (LOPRESSOR) 25 MG tablet Take 25 mg by mouth 2 (two) times daily.    . multivitamin (RENA-VIT) TABS tablet Take 1 tablet by mouth at bedtime.  0  . Nutritional Supplements (FEEDING SUPPLEMENT, NEPRO CARB STEADY,) LIQD Take 237 mLs by mouth 3 (three) times daily between meals. (Patient taking differently: Take 237 mLs by mouth See admin instructions. Drink 237 ml by mouth once daily for nutrition. Take 237 ml by mouth every 24 hours as needed for nutrition.) 90 Can 0  . nystatin (MYCOSTATIN/NYSTOP) powder Apply 1 g topically 2 (two) times daily.     . ondansetron (ZOFRAN) 8 MG tablet Take 8 mg by mouth every 8 (eight) hours as needed for nausea or vomiting.     [name] oxycodone (OXY-IR) 5 MG capsule 5 mg every four hours for moderate pain; 10 mg every four hours for sever pain (Patient taking differently: Take 5 mg by mouth every 4 (four) hours as needed for pain. ) 60 capsule 0  . sertraline (ZOLOFT) 50 MG tablet Take 50 mg by mouth daily.    . sevelamer carbonate (RENVELA)  800 MG tablet Take 1,600 mg by mouth 3 (three) times daily with meals.     . torsemide (DEMADEX) 20 MG tablet Take 2 tablets (40 mg total) by mouth daily. 30 tablet 0  . traMADol (ULTRAM) 50 MG tablet Take 50 mg by mouth every 6 (six) hours as needed for moderate pain.     . Zinc 15 MG CAPS Take 15 mg by mouth daily.    . fentaNYL (DURAGESIC - DOSED MCG/HR) 12 MCG/HR Place 1 patch (12.5 mcg total) onto the skin every 3 (three) days. (Patient not taking: Reported on 08/22/2017) 10 patch 0  . HYDROcodone-acetaminophen (NORCO) 5-325 MG tablet Take 1-2 tablets by mouth every 6 (six) hours as needed for moderate pain or severe pain. 50 tablet 0  . irbesartan (AVAPRO) 300 MG tablet Take 1 tablet (300  mg total) by mouth at bedtime. (Patient not taking: Reported on 04/24/2017)      Past Medical History:  Diagnosis Date  . Anemia   . CKD (chronic kidney disease)   . 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 Depression   . Diastolic CHF (HCC)   . Generalized anxiety disorder   . Hyperlipidemia   . Hypertension   . Hypertension   . Hypothyroidism   . Lymph edema   . Lymphedema   . Muscle weakness (generalized)   . Nephrotic syndrome   . Obesity   . Pleural effusion   . Pulmonary hypertension (HCC)   . Respiratory failure with hypoxia (HCC)   . Sleep apnea    CPAP  . Vitamin C deficiency     Past Surgical History:  Procedure Laterality Date  . AV FISTULA PLACEMENT Left 10/05/2017   Procedure: INSERTION OF ARTERIOVENOUS (AV) GORE-TEX GRAFT ARM ( BRACHIAL AXILLARY );  Surgeon: Renford DillsSchnier, Bobi Daudelin G, MD;  Location: [facility] ORS;  Service: Vascular;  Laterality: Left;  . CARDIAC CATHETERIZATION    . DIALYSIS/PERMA CATHETER INSERTION N/A 04/02/2017   Procedure: DIALYSIS/PERMA CATHETER INSERTION;  Surgeon: Annice Needyew, Jason S, MD;  Location: [facility] INVASIVE CV LAB;  Service: Cardiovascular;  Laterality: N/A;  . DIALYSIS/PERMA CATHETER INSERTION N/A 04/25/2017   Procedure: DIALYSIS/PERMA CATHETER INSERTION;  Surgeon: Annice Needyew, Jason S, MD;  Location: [facility] INVASIVE CV LAB;  Service: Cardiovascular;  Laterality: N/A;  . UPPER GI ENDOSCOPY      Social History Social History   Tobacco Use  . Smoking status: Passive Smoke Exposure - Never Smoker  . Smokeless tobacco: Never Used  Substance Use Topics  . Alcohol use: No  . Drug use: No    Family History Family History  Problem Relation Age of Onset  . Hypertension Mother     No family history of bleeding or clotting disorders, autoimmune disease or porphyria  Allergies  Allergen Reactions  . Morphine And Related     Hallucinations     REVIEW OF SYSTEMS (Negative unless checked)  Constitutional: [] Weight loss  [] Fever  [] Chills Cardiac: [] Chest  pain   [] Chest pressure   [] Palpitations   [] Shortness of breath when laying flat   [] Shortness of breath at rest   [x] Shortness of breath with exertion. Vascular:  [] Pain in legs with walking   [] Pain in legs at rest   [] Pain in legs when laying flat   [] Claudication   [] Pain in feet when walking  [] Pain in feet at rest  [] Pain in feet when laying flat   [] History of DVT   [] Phlebitis   [] Swelling in legs   [] Varicose veins   []   Non-healing ulcers Pulmonary:   [] Uses home oxygen   [] Productive cough   [] Hemoptysis   [] Wheeze  [] COPD   [] Asthma Neurologic:  [] Dizziness  [] Blackouts   [] Seizures   [] History of stroke   [] History of TIA  [] Aphasia   [] Temporary blindness   [] Dysphagia   [] Weakness or numbness in arms   [] Weakness or numbness in legs Musculoskeletal:  [] Arthritis   [] Joint swelling   [] Joint pain   [] Low back pain Hematologic:  [] Easy bruising  [] Easy bleeding   [] Hypercoagulable state   [] Anemic  [] Hepatitis Gastrointestinal:  [] Blood in stool   [] Vomiting blood  [] Gastroesophageal reflux/heartburn   [] Difficulty swallowing. Genitourinary:  [x] Chronic kidney disease   [] Difficult urination  [] Frequent urination  [] Burning with urination   [] Blood in urine Skin:  [] Rashes   [] Ulcers   [] Wounds Psychological:  [] History of anxiety   []  History of major depression.  Physical Examination  Vitals:   10/05/17 1030 10/05/17 1034 10/05/17 1046 10/05/17 1059  BP:  116/82 116/85 110/71  Pulse:  92 88 91  Resp:  16 11 20   Temp:    (!) 97.2 F (36.2 C)  TempSrc:      SpO2: 92% 92% 96% 95%  Weight:      Height:       Body mass index is 33.73 kg/m. Gen: WD/WN, NAD Head: /AT, No temporalis wasting. Prominent temp pulse not noted. Ear/Nose/Throat: Hearing grossly intact, nares w/o erythema or drainage, oropharynx w/o Erythema/Exudate,  Eyes: Conjunctiva clear, sclera non-icteric Neck: Trachea midline.  No JVD.  Pulmonary:  Good air movement, respirations not labored, no use of  accessory muscles.  Cardiac: RRR, normal S1, S2. Vascular: right tunneled cath nontender no drainage Vessel Right Left  Radial Palpable Palpable  Ulnar Not Palpable Not Palpable  Brachial Palpable Palpable  Carotid Palpable, without bruit Palpable, without bruit  Gastrointestinal: soft, non-tender/non-distended. No guarding/reflex.  Musculoskeletal: M/S 5/5 throughout.  Extremities without ischemic changes.  No deformity or atrophy.  Neurologic: Sensation grossly intact in extremities.  Symmetrical.  Speech is fluent. Motor exam as listed above. Psychiatric: Judgment intact, Mood & affect appropriate for pt's clinical situation. Dermatologic: No rashes or ulcers noted.  No cellulitis or open wounds. Lymph : No Cervical, Axillary, or Inguinal lymphadenopathy.   CBC Lab Results  Component Value Date   WBC 5.8 08/31/2017   HGB 13.9 10/05/2017   HCT 41.0 10/05/2017   MCV 79.2 (L) 08/31/2017   PLT 276 08/31/2017    BMET    Component Value Date/Time   NA 141 10/05/2017 0637   K 3.6 10/05/2017 0637   CL 99 (L) 08/31/2017 1415   CO2 27 08/31/2017 1415   GLUCOSE 78 10/05/2017 0637   BUN 38 (H) 08/31/2017 1415   CREATININE 4.80 (H) 08/31/2017 1415   CALCIUM 8.4 (L) 08/31/2017 1415   GFRNONAA 14 (L) 08/31/2017 1415   GFRAA 17 (L) 08/31/2017 1415   CrCl cannot be calculated (Patient's most recent lab result is older than the maximum 21 days allowed.).  COAG Lab Results  Component Value Date   INR 1.09 08/31/2017   INR 1.14 04/11/2017   INR 1.46 04/01/2017    Radiology No results found.  Assessment/Plan 1.  End-stage renal disease requiring hemodialysis:  Patient will continue dialysis therapy without further interruption if a successful intervention is not achieved then a tunneled catheter will be placed. Dialysis has already been arranged. 2.  Hypothyroidism: Continue Synthroid 3.  Hypertension:  Patient will continue medical management;  nephrology is following no changes  in oral medications. 4. Diabetes mellitus:  Glucose will be monitored and oral medications been held this morning once the patient has undergone the patient's procedure po intake will be reinitiated and again Accu-Cheks will be used to assess the blood glucose level and treat as needed. The patient will be restarted on the patient's usual hypoglycemic regime 5.  CHF:  EKG will be monitored. Nitrates will be used if needed. [name] oral cardiac medications will be continued.    Levora Dredge, MD  10/10/2017 2:05 PM

## 2017-10-22 ENCOUNTER — Ambulatory Visit (INDEPENDENT_AMBULATORY_CARE_PROVIDER_SITE_OTHER): Payer: Medicaid Other | Admitting: Vascular Surgery

## 2017-10-22 ENCOUNTER — Encounter (INDEPENDENT_AMBULATORY_CARE_PROVIDER_SITE_OTHER): Payer: Self-pay | Admitting: Vascular Surgery

## 2017-10-22 VITALS — BP 111/80 | HR 67 | Resp 16 | Ht 70.0 in | Wt 239.0 lb

## 2017-10-22 DIAGNOSIS — Z992 Dependence on renal dialysis: Secondary | ICD-10-CM

## 2017-10-22 DIAGNOSIS — N186 End stage renal disease: Secondary | ICD-10-CM

## 2017-10-22 DIAGNOSIS — I739 Peripheral vascular disease, unspecified: Secondary | ICD-10-CM

## 2017-10-22 DIAGNOSIS — I10 Essential (primary) hypertension: Secondary | ICD-10-CM

## 2017-10-22 NOTE — Progress Notes (Signed)
Patient ID: [name], male   DOB: Aug 24, 1980, 37 y.o.   MRN: [id number]  Chief Complaint  Patient presents with  . Follow-up    2 week f/u    HPI [name] is a 37 y.o. male.   No c/o left hand warm Denies pain in the left arm or hand  No fever chills  He notes ulcers of both legs   Past Medical History:  Diagnosis Date  . Anemia   . CKD (chronic kidney disease)   . 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 Depression   . Diastolic CHF (HCC)   . Generalized anxiety disorder   . Hyperlipidemia   . Hypertension   . Hypertension   . Hypothyroidism   . Lymph edema   . Lymphedema   . Muscle weakness (generalized)   . Nephrotic syndrome   . Obesity   . Pleural effusion   . Pulmonary hypertension (HCC)   . Respiratory failure with hypoxia (HCC)   . Sleep apnea    CPAP  . Vitamin C deficiency     Past Surgical History:  Procedure Laterality Date  . AV FISTULA PLACEMENT Left 10/05/2017   Procedure: INSERTION OF ARTERIOVENOUS (AV) GORE-TEX GRAFT ARM ( BRACHIAL AXILLARY );  Surgeon: Renford Dills, MD;  Location: [facility] ORS;  Service: Vascular;  Laterality: Left;  . CARDIAC CATHETERIZATION    . DIALYSIS/PERMA CATHETER INSERTION N/A 04/02/2017   Procedure: DIALYSIS/PERMA CATHETER INSERTION;  Surgeon: Annice Needy, MD;  Location: [facility] INVASIVE CV LAB;  Service: Cardiovascular;  Laterality: N/A;  . DIALYSIS/PERMA CATHETER INSERTION N/A 04/25/2017   Procedure: DIALYSIS/PERMA CATHETER INSERTION;  Surgeon: Annice Needy, MD;  Location: [facility] INVASIVE CV LAB;  Service: Cardiovascular;  Laterality: N/A;  . UPPER GI ENDOSCOPY        Allergies  Allergen Reactions  . Morphine And Related     Hallucinations Hallucinations    Current Outpatient Medications  Medication Sig Dispense Refill  . amLODipine (NORVASC) 5 MG tablet Take 5 mg by mouth daily.    [name] apixaban (ELIQUIS) 5 MG TABS tablet Take 1 tablet (5 mg total) by mouth 2 (two) times daily. 60 tablet 0  . ascorbic  acid (VITAMIN C) 500 MG tablet Take 500 mg by mouth 2 (two) times daily.    [name] buPROPion (WELLBUTRIN) 100 MG tablet Take 150 mg by mouth 2 (two) times daily.     . collagenase (SANTYL) ointment Apply 1 application topically every evening.     . docusate sodium (COLACE) 100 MG capsule Take 1 capsule (100 mg total) by mouth 2 (two) times daily. 10 capsule 0  . fentaNYL (DURAGESIC - DOSED MCG/HR) 12 MCG/HR Place 1 patch (12.5 mcg total) onto the skin every 3 (three) days. 10 patch 0  . ferrous sulfate 325 (65 FE) MG tablet Take 1 tablet (325 mg total) by mouth daily at 12 noon. (Patient taking differently: Take 325 mg by mouth daily. )  3  . finasteride (PROSCAR) 5 MG tablet Take 5 mg by mouth daily. Tues Thurs and Sat evening    . gabapentin (NEURONTIN) 100 MG capsule Take 100-300 mg by mouth See admin instructions. Take 100 mg by mouth in the evening on Tuesday, Thursday and Saturday. Take 300 mg by mouth in the evening on all other days    . HYDROcodone-acetaminophen (NORCO) 5-325 MG tablet Take 1-2 tablets by mouth every 6 (six) hours as needed for moderate pain or severe pain. 50 tablet  0  . irbesartan (AVAPRO) 300 MG tablet Take 1 tablet (300 mg total) by mouth at bedtime.    [name] levothyroxine (SYNTHROID, LEVOTHROID) 75 MCG tablet Take 75 mcg by mouth daily before breakfast.    . losartan (COZAAR) 100 MG tablet Take 100 mg by mouth at bedtime.     . metoprolol tartrate (LOPRESSOR) 25 MG tablet Take 25 mg by mouth 2 (two) times daily.    . multivitamin (RENA-VIT) TABS tablet Take 1 tablet by mouth at bedtime.  0  . Nutritional Supplements (FEEDING SUPPLEMENT, NEPRO CARB STEADY,) LIQD Take 237 mLs by mouth 3 (three) times daily between meals. (Patient taking differently: Take 237 mLs by mouth See admin instructions. Drink 237 ml by mouth once daily for nutrition. Take 237 ml by mouth every 24 hours as needed for nutrition.) 90 Can 0  . nystatin (MYCOSTATIN/NYSTOP) powder Apply 1 g topically 2 (two)  times daily.     . ondansetron (ZOFRAN) 8 MG tablet Take 8 mg by mouth every 8 (eight) hours as needed for nausea or vomiting.     [name] oxycodone (OXY-IR) 5 MG capsule 5 mg every four hours for moderate pain; 10 mg every four hours for sever pain (Patient taking differently: Take 5 mg by mouth every 4 (four) hours as needed for pain. ) 60 capsule 0  . sertraline (ZOLOFT) 50 MG tablet Take 50 mg by mouth daily.    . sevelamer carbonate (RENVELA) 800 MG tablet Take 1,600 mg by mouth 3 (three) times daily with meals.     . torsemide (DEMADEX) 20 MG tablet Take 2 tablets (40 mg total) by mouth daily. 30 tablet 0  . traMADol (ULTRAM) 50 MG tablet Take 50 mg by mouth every 6 (six) hours as needed for moderate pain.     . Zinc 15 MG CAPS Take 15 mg by mouth daily.     No current facility-administered medications for this visit.         Physical Exam BP 111/80 (BP Location: Right Arm, Patient Position: Sitting)   Pulse 67   Resp 16   Ht 5\' 10"  (1.778 m)   Wt 239 lb (108.4 kg)   BMI 34.29 kg/m  Gen:  WD/WN, NAD Skin:  Left arm incision C/D/I; brach ax good thrill good bruit     Assessment/Plan:  1. ESRD on dialysis [facility]) Recommend:  The patient is doing well and currently has adequate dialysis access. [name] dialysis center is not reporting any access issues. Flow pattern is stable when compared to the prior ultrasound.  The patient should have a duplex ultrasound of the dialysis access in 6 months.  The patient will follow-up with me in the office after each ultrasound     2. PAD (peripheral artery disease) (HCC)  Recommend:  The patient has evidence of atherosclerosis of the lower extremities with claudication.  The patient does not voice lifestyle limiting changes at this point in time.  Noninvasive studies do not suggest clinically significant change.  No invasive studies, angiography or surgery at this time The patient should continue walking and begin a more  formal exercise program.  The patient should continue antiplatelet therapy and aggressive treatment of the lipid abnormalities  No changes in the patient's medications at this time  The patient should continue wearing graduated compression socks 10-15 mmHg strength to control the mild edema.    3. Essential hypertension Continue antihypertensive medications as already ordered, these medications have been reviewed and there are  no changes at this time.     Levora DredgeGregory Drevin Ortner 10/22/2017, 9:41 PM   This note was created with Dragon medical transcription system.  Any errors from dictation are unintentional.

## 2017-10-30 ENCOUNTER — Encounter: Payer: Self-pay | Admitting: Vascular Surgery

## 2017-11-08 ENCOUNTER — Encounter (INDEPENDENT_AMBULATORY_CARE_PROVIDER_SITE_OTHER): Payer: Self-pay

## 2017-11-08 ENCOUNTER — Other Ambulatory Visit (INDEPENDENT_AMBULATORY_CARE_PROVIDER_SITE_OTHER): Payer: Self-pay | Admitting: Vascular Surgery

## 2017-11-12 ENCOUNTER — Ambulatory Visit: Admission: AD | Admit: 2017-11-12 | Payer: Medicaid Other | Source: Ambulatory Visit | Admitting: Vascular Surgery

## 2017-11-12 ENCOUNTER — Encounter: Admission: AD | Payer: Self-pay | Source: Ambulatory Visit

## 2017-11-12 SURGERY — DIALYSIS/PERMA CATHETER REMOVAL
Anesthesia: LOCAL

## 2017-11-13 ENCOUNTER — Other Ambulatory Visit (INDEPENDENT_AMBULATORY_CARE_PROVIDER_SITE_OTHER): Payer: Self-pay | Admitting: Vascular Surgery

## 2017-11-19 ENCOUNTER — Encounter
Admission: RE | Disposition: A | Discharge status: Home / Self Care | Payer: Self-pay | Source: Ambulatory Visit | Attending: Vascular Surgery

## 2017-11-19 ENCOUNTER — Ambulatory Visit
Admission: RE | Admit: 2017-11-19 | Discharge: 2017-11-19 | Disposition: A | Discharge status: Home / Self Care | Payer: Medicaid Other | Source: Ambulatory Visit | Attending: Vascular Surgery | Admitting: Vascular Surgery

## 2017-11-19 DIAGNOSIS — E039 Hypothyroidism, unspecified: Secondary | ICD-10-CM | POA: Insufficient documentation

## 2017-11-19 DIAGNOSIS — E54 Ascorbic acid deficiency: Secondary | ICD-10-CM | POA: Diagnosis not present

## 2017-11-19 DIAGNOSIS — F329 Major depressive disorder, single episode, unspecified: Secondary | ICD-10-CM | POA: Insufficient documentation

## 2017-11-19 DIAGNOSIS — J90 Pleural effusion, not elsewhere classified: Secondary | ICD-10-CM | POA: Insufficient documentation

## 2017-11-19 DIAGNOSIS — E785 Hyperlipidemia, unspecified: Secondary | ICD-10-CM | POA: Insufficient documentation

## 2017-11-19 DIAGNOSIS — Z6832 Body mass index (BMI) 32.0-32.9, adult: Secondary | ICD-10-CM | POA: Diagnosis not present

## 2017-11-19 DIAGNOSIS — E669 Obesity, unspecified: Secondary | ICD-10-CM | POA: Diagnosis not present

## 2017-11-19 DIAGNOSIS — Z7722 Contact with and (suspected) exposure to environmental tobacco smoke (acute) (chronic): Secondary | ICD-10-CM | POA: Diagnosis not present

## 2017-11-19 DIAGNOSIS — I272 Pulmonary hypertension, unspecified: Secondary | ICD-10-CM | POA: Insufficient documentation

## 2017-11-19 DIAGNOSIS — Z8249 Family history of ischemic heart disease and other diseases of the circulatory system: Secondary | ICD-10-CM | POA: Insufficient documentation

## 2017-11-19 DIAGNOSIS — Z992 Dependence on renal dialysis: Secondary | ICD-10-CM | POA: Diagnosis not present

## 2017-11-19 DIAGNOSIS — N186 End stage renal disease: Secondary | ICD-10-CM

## 2017-11-19 DIAGNOSIS — I132 Hypertensive heart and chronic kidney disease with heart failure and with stage 5 chronic kidney disease, or end stage renal disease: Secondary | ICD-10-CM | POA: Diagnosis not present

## 2017-11-19 DIAGNOSIS — G4730 Sleep apnea, unspecified: Secondary | ICD-10-CM | POA: Insufficient documentation

## 2017-11-19 DIAGNOSIS — Z885 Allergy status to narcotic agent status: Secondary | ICD-10-CM | POA: Diagnosis not present

## 2017-11-19 DIAGNOSIS — I5032 Chronic diastolic (congestive) heart failure: Secondary | ICD-10-CM | POA: Insufficient documentation

## 2017-11-19 DIAGNOSIS — Z4901 Encounter for fitting and adjustment of extracorporeal dialysis catheter: Secondary | ICD-10-CM | POA: Insufficient documentation

## 2017-11-19 HISTORY — PX: DIALYSIS/PERMA CATHETER REMOVAL: CATH118289

## 2017-11-19 SURGERY — DIALYSIS/PERMA CATHETER REMOVAL
Anesthesia: LOCAL

## 2017-11-19 MED ORDER — LIDOCAINE-EPINEPHRINE (PF) 1 %-1:200000 IJ SOLN
INTRAMUSCULAR | Status: DC | PRN
  Administered 2017-11-19: 20 mL

## 2017-11-19 SURGICAL SUPPLY — 3 items
FORCEPS HALSTEAD CVD 5IN STRL (INSTRUMENTS) ×2 IMPLANT
PREP CHG 10.5 TEAL (MISCELLANEOUS) ×4 IMPLANT
TRAY LACERAT/PLASTIC (MISCELLANEOUS) ×2 IMPLANT

## 2017-11-19 NOTE — H&P (Signed)
[facility] VASCULAR & VEIN SPECIALISTS Admission History & Physical  MRN : [id number]  [name] is a 37 y.o. (1981-04-19) male who presents with chief complaint of No chief complaint on file. [name]  History of Present Illness: I am asked to evaluate the patient by the dialysis center. The patient was sent here because they have a nonfunctioning tunneled catheter and a functioning AV graft.  The patient reports they're not been any problems with any of their dialysis runs. They are reporting good flows with good parameters at dialysis.  Patient denies pain or tenderness overlying the access.  There is no pain with dialysis.  The patient denies hand pain or finger pain consistent with steal syndrome.  No fevers or chills while on dialysis.   No current facility-administered medications for this encounter.     Past Medical History:  Diagnosis Date  . Anemia   . CKD (chronic kidney disease)   . 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 Depression   . Diastolic CHF (HCC)   . Generalized anxiety disorder   . Hyperlipidemia   . Hypertension   . Hypertension   . Hypothyroidism   . Lymph edema   . Lymphedema   . Muscle weakness (generalized)   . Nephrotic syndrome   . Obesity   . Pleural effusion   . Pulmonary hypertension (HCC)   . Respiratory failure with hypoxia (HCC)   . Sleep apnea    CPAP  . Vitamin C deficiency     Past Surgical History:  Procedure Laterality Date  . AV FISTULA PLACEMENT Left 10/05/2017   Procedure: INSERTION OF ARTERIOVENOUS (AV) GORE-TEX GRAFT ARM ( BRACHIAL AXILLARY );  Surgeon: Renford Dills, MD;  Location: [facility] ORS;  Service: Vascular;  Laterality: Left;  . CARDIAC CATHETERIZATION    . DIALYSIS/PERMA CATHETER INSERTION N/A 04/02/2017   Procedure: DIALYSIS/PERMA CATHETER INSERTION;  Surgeon: Annice Needy, MD;  Location: [facility] INVASIVE CV LAB;  Service: Cardiovascular;  Laterality: N/A;  . DIALYSIS/PERMA CATHETER INSERTION N/A 04/25/2017   Procedure:  DIALYSIS/PERMA CATHETER INSERTION;  Surgeon: Annice Needy, MD;  Location: [facility] INVASIVE CV LAB;  Service: Cardiovascular;  Laterality: N/A;  . UPPER GI ENDOSCOPY      Social History Social History   Tobacco Use  . Smoking status: Passive Smoke Exposure - Never Smoker  . Smokeless tobacco: Never Used  Substance Use Topics  . Alcohol use: No  . Drug use: No    Family History Family History  Problem Relation Age of Onset  . Hypertension Mother     No family history of bleeding or clotting disorders, autoimmune disease or porphyria  Allergies  Allergen Reactions  . Morphine And Related Other (See Comments)    Hallucinations     REVIEW OF SYSTEMS (Negative unless checked)  Constitutional: [] Weight loss  [] Fever  [] Chills Cardiac: [] Chest pain   [] Chest pressure   [] Palpitations   [] Shortness of breath when laying flat   [] Shortness of breath at rest   [x] Shortness of breath with exertion. Vascular:  [] Pain in legs with walking   [] Pain in legs at rest   [] Pain in legs when laying flat   [] Claudication   [] Pain in feet when walking  [] Pain in feet at rest  [] Pain in feet when laying flat   [] History of DVT   [] Phlebitis   [] Swelling in legs   [] Varicose veins   [] Non-healing ulcers Pulmonary:   [] Uses home oxygen   [] Productive cough   [] Hemoptysis   []   Wheeze  [] COPD   [] Asthma Neurologic:  [] Dizziness  [] Blackouts   [] Seizures   [] History of stroke   [] History of TIA  [] Aphasia   [] Temporary blindness   [] Dysphagia   [] Weakness or numbness in arms   [] Weakness or numbness in legs Musculoskeletal:  [x] Arthritis   [] Joint swelling   [] Joint pain   [] Low back pain Hematologic:  [] Easy bruising  [] Easy bleeding   [] Hypercoagulable state   [x] Anemic  [] Hepatitis Gastrointestinal:  [] Blood in stool   [] Vomiting blood  [] Gastroesophageal reflux/heartburn   [] Difficulty swallowing. Genitourinary:  [x] Chronic kidney disease   [] Difficult urination  [] Frequent urination  [] Burning with  urination   [] Blood in urine Skin:  [] Rashes   [] Ulcers   [] Wounds Psychological:  [] History of anxiety   []  History of major depression.  Physical Examination  There were no vitals filed for this visit. There is no height or weight on file to calculate BMI. Gen: WD/WN, NAD. Obese  Head: [place]/AT, No temporalis wasting.  Ear/Nose/Throat: Hearing grossly intact, nares w/o erythema or drainage, oropharynx w/o Erythema/Exudate,  Eyes: Conjunctiva clear, sclera non-icteric Neck: Trachea midline.  No JVD.  Pulmonary:  Good air movement, respirations not labored, no use of accessory muscles.  Cardiac: RRR, normal S1, S2. Vascular: good thrill in AVG Vessel Right Left  Radial Palpable Palpable               Musculoskeletal: M/S 5/5 throughout.  Extremities without ischemic changes.  No deformity or atrophy.  Neurologic: Sensation grossly intact in extremities.  Symmetrical.  Speech is fluent. Motor exam as listed above. Psychiatric: Judgment intact, Mood & affect appropriate for pt's clinical situation. Dermatologic: No rashes or ulcers noted.  No cellulitis or open wounds.    CBC Lab Results  Component Value Date   WBC 5.8 08/31/2017   HGB 13.9 10/05/2017   HCT 41.0 10/05/2017   MCV 79.2 (L) 08/31/2017   PLT 276 08/31/2017    BMET    Component Value Date/Time   NA 141 10/05/2017 0637   K 3.6 10/05/2017 0637   CL 99 (L) 08/31/2017 1415   CO2 27 08/31/2017 1415   GLUCOSE 78 10/05/2017 0637   BUN 38 (H) 08/31/2017 1415   CREATININE 4.80 (H) 08/31/2017 1415   CALCIUM 8.4 (L) 08/31/2017 1415   GFRNONAA 14 (L) 08/31/2017 1415   GFRAA 17 (L) 08/31/2017 1415   CrCl cannot be calculated (Patient's most recent lab result is older than the maximum 21 days allowed.).  COAG Lab Results  Component Value Date   INR 1.09 08/31/2017   INR 1.14 04/11/2017   INR 1.46 04/01/2017    Radiology No results found.  Assessment/Plan 1.  Complication dialysis device:  Patient's Tunneled  catheter is malfunctioning. The patient has an extremity access that is functioning well. Therefore, the patient will undergo removal of the tunneled catheter under local anesthesia.  The risks and benefits were described to the patient.  All questions were answered.  The patient agrees to proceed with angiography and intervention. Potassium will be drawn to ensure that it is an appropriate level prior to performing intervention. 2.  End-stage renal disease requiring hemodialysis:  Patient will continue dialysis therapy without further interruption. Dialysis has already been arranged. 3.  Hypertension:  Patient will continue medical management; nephrology is following no changes in oral medications.     Festus BarrenJason Monte Zinni, MD  11/19/2017 10:58 AM

## 2017-11-19 NOTE — Op Note (Signed)
Operative Note     Preoperative diagnosis:   1. ESRD with functional permanent access  Postoperative diagnosis:  1. ESRD with functional permanent access  Procedure:  Removal of right jugular Permcath  Surgeon:  Festus BarrenJason Riyanshi Wahab, MD  Anesthesia:  Local  EBL:  Minimal  Indication for the Procedure:  The patient has a functional permanent dialysis access and no longer needs their permcath.  This can be removed.  Risks and benefits are discussed and informed consent is obtained.  Description of the Procedure:  [name] right neck, chest and existing catheter were sterilely prepped and draped. The area around the catheter was anesthetized copiously with 1% lidocaine. The catheter was dissected out with curved hemostats until the cuff was freed from the surrounding fibrous sheath. The fiber sheath was transected, and the catheter was then removed in its entirety using gentle traction. Pressure was held and sterile dressings were placed. The patient tolerated the procedure well and was taken to the recovery room in stable condition.     Festus BarrenJason Navneet Schmuck  11/19/2017, 11:43 AM This note was created with Dragon Medical transcription system. Any errors in dictation are purely unintentional.

## 2017-12-03 ENCOUNTER — Encounter: Payer: Medicaid Other | Attending: Physician Assistant | Admitting: Physician Assistant

## 2017-12-03 DIAGNOSIS — Z833 Family history of diabetes mellitus: Secondary | ICD-10-CM | POA: Insufficient documentation

## 2017-12-03 DIAGNOSIS — I5033 Acute on chronic diastolic (congestive) heart failure: Secondary | ICD-10-CM | POA: Insufficient documentation

## 2017-12-03 DIAGNOSIS — L97122 Non-pressure chronic ulcer of left thigh with fat layer exposed: Secondary | ICD-10-CM | POA: Diagnosis not present

## 2017-12-03 DIAGNOSIS — I132 Hypertensive heart and chronic kidney disease with heart failure and with stage 5 chronic kidney disease, or end stage renal disease: Secondary | ICD-10-CM | POA: Diagnosis not present

## 2017-12-03 DIAGNOSIS — D631 Anemia in chronic kidney disease: Secondary | ICD-10-CM | POA: Insufficient documentation

## 2017-12-03 DIAGNOSIS — Z8249 Family history of ischemic heart disease and other diseases of the circulatory system: Secondary | ICD-10-CM | POA: Insufficient documentation

## 2017-12-03 DIAGNOSIS — Z823 Family history of stroke: Secondary | ICD-10-CM | POA: Insufficient documentation

## 2017-12-03 DIAGNOSIS — N186 End stage renal disease: Secondary | ICD-10-CM | POA: Insufficient documentation

## 2017-12-07 NOTE — Progress Notes (Signed)
QUE, MENEELY ([id number]) Visit Report for 12/03/2017 Abuse/Suicide Risk Screen Details Patient Name: [name], [name] Date of Service: 12/03/2017 8:00 AM Medical Record Number: [id number] Patient Account Number: [id number] Date of Birth/Sex: 17-Jan-1981 (36 y.o. Male) Treating RN: [name] Primary Care [name]: [name] Other Clinician: Referring [name]: [name] Treating [name]/Extender: [name] Weeks in Treatment: 0 Abuse/Suicide Risk Screen Items Answer ABUSE/SUICIDE RISK SCREEN: Has anyone close to you tried to hurt or harm you recentlyo No Do you feel uncomfortable with anyone in your familyo No Has anyone forced you do things that you didnot want to doo No Do you have any thoughts of harming yourselfo No Patient displays signs or symptoms of abuse and/or neglect. No Electronic Signature(s) Signed: 12/04/2017 4:18:46 PM By: [name] Entered By: [name] on 12/03/2017 08:13:07 [name], [name] ([id number]) -------------------------------------------------------------------------------- Activities of Daily Living Details Patient Name: [name], [name] Date of Service: 12/03/2017 8:00 AM Medical Record Number: [id number] Patient Account Number: [id number] Date of Birth/Sex: 1981/02/03 (37 y.o. Male) Treating RN: [name] Primary Care Elijah Michaelis: [name] Other Clinician: Referring Aras Albarran: [name] Treating Carlise Stofer/Extender: [name] Weeks in Treatment: 0 Activities of Daily Living Items Answer Activities of Daily Living (Please select one for each item) Drive Automobile Not Able Take Medications Completely Able Use Telephone Completely Able Care for Appearance Completely Able Use Toilet Completely Able Bath / Shower Completely Able Dress Self Completely Able Feed Self Completely Able Walk Completely Able Get In / Out Bed Completely Able Housework Completely Able Prepare Meals Completely Able Handle Money Completely  Able Shop for Self Need Assistance Electronic Signature(s) Signed: 12/03/2017 2:15:14 PM By: Brian Gurney, BSN, RN, CWS, Kim RN, BSN Signed: 12/04/2017 4:18:46 PM By: [name] Entered By: [name] on 12/03/2017 14:15:14 [name] ([id number]) -------------------------------------------------------------------------------- Education Assessment Details Patient Name: [name] Date of Service: 12/03/2017 8:00 AM Medical Record Number: [id number] Patient Account Number: [id number] Date of Birth/Sex: 05/20/1981 (36 y.o. Male) Treating RN: [name] Primary Care Georgiana Spillane: [name] Other Clinician: Referring Sarya Linenberger: [name] Treating Jarome Trull/Extender: [name] Weeks in Treatment: 0 Primary Learner Assessed: Patient Learning Preferences/Education Level/Primary Language Learning Preference: Explanation, Demonstration Highest Education Level: High School Preferred Language: English Cognitive Barrier Assessment/Beliefs Language Barrier: No Translator Needed: No Memory Deficit: No Emotional Barrier: No Cultural/Religious Beliefs Affecting Medical Care: No Physical Barrier Assessment Impaired Vision: No Impaired Hearing: No Decreased Hand dexterity: No Knowledge/Comprehension Assessment Knowledge Level: Medium Comprehension Level: Medium Ability to understand written Medium instructions: Ability to understand verbal Medium instructions: Motivation Assessment Anxiety Level: Calm Cooperation: Cooperative Education Importance: Acknowledges Need Interest in Health Problems: Asks Questions Perception: Coherent Willingness to Engage in Self- Medium Management Activities: Readiness to Engage in Self- Medium Management Activities: Electronic Signature(s) Signed: 12/03/2017 2:15:47 PM By: Brian Gurney, BSN, RN, CWS, Kim RN, BSN Signed: 12/04/2017 4:18:46 PM By: [name] Entered By: [name] on 12/03/2017 14:15:47 [name]  ([id number]) -------------------------------------------------------------------------------- Fall Risk Assessment Details Patient Name: [name] Date of Service: 12/03/2017 8:00 AM Medical Record Number: [id number] Patient Account Number: [id number] Date of Birth/Sex: 1981/03/21 (36 y.o. Male) Treating RN: [name] Primary Care Batya Citron: [name] Other Clinician: Referring Kharson Rasmusson: [name] Treating Jossie Smoot/Extender: [name] Weeks in Treatment: 0 Fall Risk Assessment Items Have you had 2 or more falls in the last 12 monthso 0 No Have you had any fall that resulted in injury in the last 12 monthso 0 No FALL RISK ASSESSMENT: History of falling -  immediate or within 3 months 0 No Secondary diagnosis 0 No Ambulatory aid None/bed rest/wheelchair/nurse 0 No Crutches/cane/walker 15 Yes Furniture 0 No IV Access/Saline Lock 0 No Gait/Training Normal/bed rest/immobile 0 Yes Weak 0 No Impaired 0 No Mental Status Oriented to own ability 0 Yes Electronic Signature(s) Signed: 12/04/2017 4:18:46 PM By: Brian Sitesorthy, [name] Entered By: Brian Sitesorthy, [name] on 12/03/2017 08:14:19 [name] ([id number]) -------------------------------------------------------------------------------- Foot Assessment Details Patient Name: [name], [name] Date of Service: 12/03/2017 8:00 AM Medical Record Number: [id number] Patient Account Number: [id number] Date of Birth/Sex: 14-Apr-1981 [age] y.o. Male) Treating RN: Brian Sitesorthy, [name] Primary Care [name]: Brian GreenspanHODGES, [name] Other Clinician: Referring [name]: Brian PercyMIEDEMA, [name] Treating [name]/Extender: [name] Weeks in Treatment: 0 Foot Assessment Items Site Locations + = Sensation present, - = Sensation absent, C = Callus, U = Ulcer R = Redness, W = Warmth, M = Maceration, PU = Pre-ulcerative lesion F = Fissure, S = Swelling, D = Dryness Assessment Right: Left: Other Deformity: No No Prior Foot Ulcer: No No Prior Amputation: No No Charcot  Joint: No No Ambulatory Status: Gait: Electronic Signature(s) Signed: 12/04/2017 4:18:46 PM By: Brian Sitesorthy, [name] Entered By: Brian Sitesorthy, [name] on 12/03/2017 08:28:04 [name], [name] ([id number]) -------------------------------------------------------------------------------- Nutrition Risk Assessment Details Patient Name: [name], [name] Date of Service: 12/03/2017 8:00 AM Medical Record Number: [id number] Patient Account Number: [id number] Date of Birth/Sex: 14-Apr-1981 [age] y.o. Male) Treating RN: Brian Sitesorthy, [name] Primary Care Izabellah Dadisman: Brian GreenspanHODGES, [name] Other Clinician: Referring Burnice Vassel: Brian PercyMIEDEMA, [name] Treating Nakenya Theall/Extender: [name] Weeks in Treatment: 0 Height (in): 70 Weight (lbs): 245 Body Mass Index (BMI): 35.2 Nutrition Risk Assessment Items NUTRITION RISK SCREEN: I have an illness or condition that made me change the kind and/or amount of 0 No food I eat I eat fewer than two meals per [name] 0 No I eat few fruits and vegetables, or milk products 0 No I have three or more drinks of beer, liquor or wine almost every [name] 0 No I have tooth or mouth problems that make it hard for me to eat 0 No I don't always have enough money to buy the food I need 0 No I eat alone most of the time 0 No I take three or more different prescribed or over-the-counter drugs a [name] 1 Yes Without wanting to, I have lost or gained 10 pounds in the last six months 0 No I am not always physically able to shop, cook and/or feed myself 0 No Nutrition Protocols Good Risk Protocol 0 No interventions needed Moderate Risk Protocol Electronic Signature(s) Signed: 12/04/2017 4:18:46 PM By: Brian Sitesorthy, [name] Entered By: Brian Sitesorthy, [name] on 12/03/2017 08:14:25

## 2017-12-07 NOTE — Progress Notes (Signed)
GLENROY, CROSSEN ([id number]) Visit Report for 12/03/2017 Chief Complaint Document Details Patient Name: [name], [name] Date of Service: 12/03/2017 8:00 AM Medical Record Number: [id number] Patient Account Number: [id number] Date of Birth/Sex: 13-Oct-1980 (37 [name].o. Male) Treating RN: Renne Crigler Primary Care Provider: Abner Greenspan Other Clinician: Referring Provider: Rory Percy Treating Provider/Extender: Linwood Dibbles, Tyniya Kuyper Weeks in Treatment: 0 Information Obtained from: Patient Chief Complaint Bilateral thigh Calciphylaxis Ulcers Electronic Signature(s) Signed: 12/03/2017 7:50:29 PM By: Lenda Kelp PA-C Entered By: Lenda Kelp on 12/03/2017 19:35:41 [name], [name] ([id number]) -------------------------------------------------------------------------------- Debridement Details Patient Name: [name] Date of Service: 12/03/2017 8:00 AM Medical Record Number: [id number] Patient Account Number: [id number] Date of Birth/Sex: 05-25-1981 (36 [name].o. Male) Treating RN: Renne Crigler Primary Care Provider: Abner Greenspan Other Clinician: Referring Provider: Rory Percy Treating Provider/Extender: Linwood Dibbles, Treyce Spillers Weeks in Treatment: 0 Debridement Performed for Wound #1 Right,Anterior Upper Leg Assessment: Performed By: Physician STONE III, Tyleigh Mahn E., PA-C Debridement Type: Debridement Pre-procedure Verification/Time Yes - 09:04 Out Taken: Start Time: 09:04 Pain Control: Other : lidiocaine 4% Total Area Debrided (L x W): 4.4 (cm) x 5.8 (cm) = 25.52 (cm) Tissue and other material Viable, Non-Viable, Slough, Subcutaneous, Biofilm, Slough debrided: Level: Skin/Subcutaneous Tissue Debridement Description: Excisional Instrument: Curette Bleeding: Moderate Hemostasis Achieved: Pressure End Time: 09:05 Procedural Pain: 0 Post Procedural Pain: 0 Response to Treatment: Procedure was tolerated well Post Debridement Measurements of Total Wound Length: (cm) 4.4 Width: (cm)  5.8 Depth: (cm) 0.2 Volume: (cm) 4.009 Character of Wound/Ulcer Post Debridement: Stable Post Procedure Diagnosis Same as Pre-procedure Electronic Signature(s) Signed: 12/03/2017 4:40:07 PM By: Renne Crigler Signed: 12/03/2017 7:50:29 PM By: Lenda Kelp PA-C Entered By: Renne Crigler on 12/03/2017 09:05:30 [name], [name] ([id number]) -------------------------------------------------------------------------------- Debridement Details Patient Name: [name] Date of Service: 12/03/2017 8:00 AM Medical Record Number: [id number] Patient Account Number: [id number] Date of Birth/Sex: [birth year] (37 [name].o. Male) Treating RN: Renne Crigler Primary Care Provider: Abner Greenspan Other Clinician: Referring Provider: Rory Percy Treating Provider/Extender: STONE III, Jessa Stinson Weeks in Treatment: 0 Debridement Performed for Wound #2 Left,Medial Upper Leg Assessment: Performed By: Physician STONE III, Adeline Petitfrere E., PA-C Debridement Type: Debridement Pre-procedure Verification/Time Yes - 09:04 Out Taken: Start Time: 09:04 Pain Control: Other : lidiocaine 4% Total Area Debrided (L x W): 2 (cm) x 4.2 (cm) = 8.4 (cm) Tissue and other material Viable, Non-Viable, Slough, Subcutaneous, Biofilm, Slough debrided: Level: Skin/Subcutaneous Tissue Debridement Description: Excisional Instrument: Curette Bleeding: Moderate Hemostasis Achieved: Pressure End Time: 09:05 Procedural Pain: 0 Post Procedural Pain: 0 Response to Treatment: Procedure was tolerated well Post Debridement Measurements of Total Wound Length: (cm) 2 Width: (cm) 4.2 Depth: (cm) 0.2 Volume: (cm) 1.319 Character of Wound/Ulcer Post Debridement: Stable Post Procedure Diagnosis Same as Pre-procedure Electronic Signature(s) Signed: 12/03/2017 4:40:07 PM By: Renne Crigler Signed: 12/03/2017 7:50:29 PM By: Lenda Kelp PA-C Entered By: Renne Crigler on 12/03/2017 09:06:05 [name], [name]  ([id number]) -------------------------------------------------------------------------------- HPI Details Patient Name: [name] Date of Service: 12/03/2017 8:00 AM Medical Record Number: [id number] Patient Account Number: [id number] Date of Birth/Sex: 12/21/1980 (37 [name].o. 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 He has currently been undergoing treatment according to notes reviewed from 11/28/17 for this. 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 Subsequently patient was using Santyl for very long time but is no longer using that at this point. He is receiving sodium thiosulfate during hemodialysis along with Sevelamer to help control his phosphorus levels. Currently a steroid cream was recommended for the wounds although he has not used that yet. He a has what appears to be extremely hyper granular and poorly attached granulation noted over the bed of the wound. This is true of both wounds of lower extremities. He does show some signs of epithelialization around the border although this has been very slow according to the patient. Fortunately he is not having significant pain at this point. He does have a history of hypertension as well is congestive heart failure. Otherwise he states the wounds are much better than when I first started. Electronic Signature(s) Signed: 12/03/2017 7:50:29 PM By: Lenda Kelp PA-C Entered By: Lenda Kelp on 12/03/2017 19:38:12 [name], [name]  ([id number]) -------------------------------------------------------------------------------- Physical Exam Details Patient Name: [name] Date of Service: 12/03/2017 8:00 AM Medical Record Number: [id number] Patient Account Number: [id number] Date of Birth/Sex: 11-25-1980 (37 [name].o. Male) Treating RN: Renne Crigler Primary Care Provider: Abner Greenspan Other Clinician: Referring Provider: Rory Percy Treating Provider/Extender: STONE III, Nithila Sumners Weeks in Treatment: 0 Constitutional sitting or standing blood pressure is within target range for patient.. pulse regular and within target range for patient.[name] respirations regular, non-labored and within target range for patient.[name] temperature within target range for patient.. Obese and well-hydrated in no acute distress. Eyes conjunctiva clear no eyelid edema noted. pupils equal round and reactive to light and accommodation. Respiratory normal breathing without difficulty. clear to auscultation bilaterally. Cardiovascular regular rate and rhythm with normal S1, S2. 1+ dorsalis pedis/posterior tibialis pulses. no clubbing, cyanosis, significant edema, <3 sec cap refill. Gastrointestinal (GI) soft, non-tender, non-distended, +BS. no ventral hernia noted. Musculoskeletal normal gait and posture. no significant deformity or arthritic changes, no loss or range of motion, no clubbing. Psychiatric this patient is able to make decisions and demonstrates good insight into disease process. Alert and Oriented x 3. Brian and cooperative. Notes Patient's wounds are extremely hyper granular on evaluation today and at this point did require sharp debridement. I was able to debride away all the hyper granular tissue of the left time with good result and post debridement the wound bed appear to be doing much better. This is excellent news. With that being said in regard to the right lower extremity while I was able to debride away the vast majority of  the nonviable hyper granular tissue the patient did have some issues with discomfort on the right. Therefore ended up using silver nitrate to chemically cauterized the remaining hyper granular tissue. Patient tolerated this very well. Electronic Signature(s) Signed: 12/03/2017 7:50:29 PM By: Lenda Kelp PA-C Entered By: Lenda Kelp on 12/03/2017 19:39:42 [name], [name] ([id number]) -------------------------------------------------------------------------------- Physician Orders Details Patient Name: [name] Date of Service: 12/03/2017 8:00 AM Medical Record Number: [id number] Patient Account Number: [id number] Date of Birth/Sex: 27-May-1981 (36 [name].o. 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 Wound #2 Left,Medial Upper Leg o Clean wound with Normal Saline. Anesthetic (add to Medication List) Wound #1 Right,Anterior Upper Leg o Topical Lidocaine 4% cream applied to wound bed prior to debridement (In Clinic Only). Wound #2 Left,Medial Upper Leg o Topical Lidocaine 4% cream applied to wound bed prior to debridement (In Clinic Only). Skin Barriers/Peri-Wound Care Wound #1 Right,Anterior Upper Leg o Skin Prep Wound #2 Left,Medial Upper Leg o Skin Prep Primary Wound Dressing Wound #1 Right,Anterior Upper Leg o Other: - silvercell Wound #2 Left,Medial Upper Leg o Other: -  silvercell Secondary Dressing Wound #1 Right,Anterior Upper Leg o Boardered Foam Dressing Wound #2 Left,Medial Upper Leg o Boardered Foam Dressing [place], [place] ([id number]) Dressing Change Frequency Wound #1 Right,Anterior Upper Leg o Change dressing every other day. Wound #2 Left,Medial Upper Leg o Change dressing every other day. Follow-up Appointments Wound #1 Right,Anterior Upper Leg o Return Appointment in 1 week. Wound #2 Left,Medial Upper Leg o Return Appointment in 1 week. Electronic Signature(s) Signed: 12/03/2017 4:40:07 PM By: Renne Crigler Signed: 12/03/2017 7:50:29 PM By: Lenda Kelp PA-C Entered By: Renne Crigler on 12/03/2017 09:16:33 Myron, [name] ([id number]) -------------------------------------------------------------------------------- Problem List Details Patient Name: [name] Date of Service: 12/03/2017 8:00 AM Medical Record Number: [id number] Patient Account Number: [id number] Date of Birth/Sex: 1980-11-08 (36 [name].o. 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 12/03/2017 Yes L97.112 Non-pressure chronic ulcer of right thigh with fat layer 12/03/2017 Yes exposed I10 Essential (primary) hypertension 12/03/2017 Yes I50.33 Acute on chronic diastolic (congestive) heart failure 12/03/2017 Yes Inactive Problems Resolved Problems Electronic Signature(s) Signed: 12/03/2017 7:50:29 PM By: Lenda Kelp PA-C Entered By: Lenda Kelp on 12/03/2017 08:53:28 [place], [place] ([id number]) -------------------------------------------------------------------------------- Progress  Note Details Patient Name: [name] Date of Service: 12/03/2017 8:00 AM Medical Record Number: [id number] Patient Account Number: [id number] Date of Birth/Sex: February 03, 1981 (37 [name].o. 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 He has currently been undergoing treatment according to notes reviewed from 11/28/17 for this. 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 Subsequently patient was using Santyl for very long time but is no longer using that at this point. He is receiving sodium thiosulfate during hemodialysis along with Sevelamer to help control his phosphorus levels. Currently a steroid cream was recommended for the wounds although he has not used that yet. He a has what appears to be extremely hyper granular and poorly attached granulation noted over the bed of the wound. This is true of both wounds of lower extremities. He does show some signs of epithelialization around the border although this has been very slow according to the patient. Fortunately he is not having significant pain at this point. He does have a history of hypertension as well is congestive heart failure. Otherwise he states the wounds are  much better than when I first started. Wound History Patient presents with 2 open wounds that have been present for approximately 6 months. Patient has been treating wounds in the following manner: triamcinalone. Laboratory tests have not been performed in the last month. Patient  reportedly has not tested positive for an antibiotic resistant organism. Patient reportedly has not tested positive for osteomyelitis. Patient reportedly has not had testing performed to evaluate circulation in the legs. Patient History Information obtained from Patient. Allergies No Known Allergies Family History Diabetes - Maternal Grandparents, Heart Disease - Maternal Grandparents, Hypertension - Maternal Grandparents, Stroke - Maternal Grandparents, No family history of Cancer, Hereditary Spherocytosis, Kidney Disease, Lung Disease, Seizures, Thyroid Problems, Tuberculosis. Social History Never smoker, Marital Status - Single, Alcohol Use - Never, Drug Use - No History, Caffeine Use - Daily. Medical History Hematologic/Lymphatic Patient has history of Anemia Denies history of Hemophilia, Human Immunodeficiency Virus, Lymphedema, Sickle Cell Disease Respiratory Patient has history of Asthma - history Denies history of Aspiration, Chronic Obstructive Pulmonary Disease (COPD), Pneumothorax, Sleep Apnea, Tuberculosis [name], [name] ([id number]) Cardiovascular Patient has history of Hypertension Gastrointestinal Denies history of Cirrhosis , Colitis, Crohn s, Hepatitis A, Hepatitis B, Hepatitis C Genitourinary Patient has history of End Stage Renal Disease Immunological Denies history of Lupus Erythematosus, Raynaud s, Scleroderma Musculoskeletal Denies history of Gout, Rheumatoid Arthritis, Osteoarthritis, Osteomyelitis Neurologic Denies history of Dementia, Neuropathy Oncologic Denies history of Received Chemotherapy, Received Radiation Review of Systems (ROS) Constitutional Symptoms (General Health) The patient has no complaints or symptoms. Eyes The patient has no complaints or symptoms. Ear/Nose/Mouth/Throat The patient has no complaints or symptoms. Hematologic/Lymphatic The patient has no complaints or symptoms. Respiratory The patient has no complaints or  symptoms. Cardiovascular The patient has no complaints or symptoms. Gastrointestinal The patient has no complaints or symptoms. Endocrine The patient has no complaints or symptoms. Genitourinary Complains or has symptoms of Kidney failure/ Dialysis - Sept 2018. Immunological The patient has no complaints or symptoms. Integumentary (Skin) The patient has no complaints or symptoms. Musculoskeletal The patient has no complaints or symptoms. Neurologic The patient has no complaints or symptoms. Oncologic The patient has no complaints or symptoms. Psychiatric The patient has no complaints or symptoms. Objective Constitutional sitting or standing blood pressure is within target range for patient.. pulse regular and within target range for patient.Shelva Majestic. [place], [place] ([id number]) respirations regular, non-labored and within target range for patient.[name]. temperature within target range for patient.. Obese and well-hydrated in no acute distress. Vitals Time Taken: 8:10 AM, Height: 70 in, Source: Measured, Weight: 245 lbs, Source: Measured, BMI: 35.2, Pulse: 67 bpm, Respiratory Rate: 18 breaths/min, Blood Pressure: 117/77 mmHg. Eyes conjunctiva clear no eyelid edema noted. pupils equal round and reactive to light and accommodation. Respiratory normal breathing without difficulty. clear to auscultation bilaterally. Cardiovascular regular rate and rhythm with normal S1, S2. 1+ dorsalis pedis/posterior tibialis pulses. no clubbing, cyanosis, significant edema, Gastrointestinal (GI) soft, non-tender, non-distended, +BS. no ventral hernia noted. Musculoskeletal normal gait and posture. no significant deformity or arthritic changes, no loss or range of motion, no clubbing. Psychiatric this patient is able to make decisions and demonstrates good insight into disease process. Alert and Oriented x 3. Brian and cooperative. General Notes: Patient's wounds are extremely hyper granular on evaluation  today and at this point did require sharp debridement. I was able to debride away all the hyper granular tissue of the left time with good result and post debridement the wound bed appear to be doing much better. This  is excellent news. With that being said in regard to the right lower extremity while I was able to debride away the vast majority of the nonviable hyper granular tissue the patient did have some issues with discomfort on the right. Therefore ended up using silver nitrate to chemically cauterized the remaining hyper granular tissue. Patient tolerated this very well. Integumentary (Hair, Skin) Wound #1 status is Open. Original cause of wound was Gradually Appeared. The wound is located on the Right,Anterior Upper Leg. The wound measures 4.4cm length x 5.8cm width x 0.1cm depth; 20.043cm^2 area and 2.004cm^3 volume. There is Fat Layer (Subcutaneous Tissue) Exposed exposed. There is no tunneling or undermining noted. There is a large amount of serous drainage noted. The wound margin is flat and intact. There is large (67-100%) friable, hyper - granulation within the wound bed. There is a small (1-33%) amount of necrotic tissue within the wound bed including Adherent Slough. The periwound skin appearance did not exhibit: Callus, Crepitus, Excoriation, Induration, Rash, Scarring, Dry/Scaly, Maceration, Atrophie Blanche, Cyanosis, Ecchymosis, Hemosiderin Staining, Mottled, Pallor, Rubor, Erythema. Periwound temperature was noted as No Abnormality. The periwound has tenderness on palpation. Wound #2 status is Open. Original cause of wound was Gradually Appeared. The wound is located on the Left,Medial Upper Leg. The wound measures 2cm length x 4.2cm width x 0.1cm depth; 6.597cm^2 area and 0.66cm^3 volume. There is Fat Layer (Subcutaneous Tissue) Exposed exposed. There is no tunneling or undermining noted. There is a large amount of serous drainage noted. The wound margin is flat and intact.  There is large (67-100%) friable, hyper - granulation within the wound bed. There is no necrotic tissue within the wound bed. The periwound skin appearance did not exhibit: Callus, Crepitus, Excoriation, Induration, Rash, Scarring, Dry/Scaly, Maceration, Atrophie Blanche, Cyanosis, Ecchymosis, Hemosiderin Staining, Mottled, Pallor, Rubor, Erythema. Periwound temperature was noted as No Abnormality. Assessment Active Problems [name], [name] ([id number]) ICD-10 E83.59 - Other disorders of calcium metabolism L97.122 - Non-pressure chronic ulcer of left thigh with fat layer exposed L97.112 - Non-pressure chronic ulcer of right thigh with fat layer exposed I10 - Essential (primary) hypertension I50.33 - Acute on chronic diastolic (congestive) heart failure Procedures Wound #1 Pre-procedure diagnosis of Wound #1 is a Calciphylaxis located on the Right,Anterior Upper Leg . There was a Excisional Skin/Subcutaneous Tissue Debridement with a total area of 25.52 sq cm performed by STONE III, [place] E., PA-C. With the following instrument(s): Curette. to remove Viable and Non-Viable tissue/material Material removed includes Subcutaneous Tissue, and Slough, Biofilm, and Slough after achieving pain control using Other (lidiocaine 4%). No specimens were taken. A time out was conducted at 09:04, prior to the start of the procedure. A Moderate amount of bleeding was controlled with Pressure. The procedure was tolerated well with a pain level of 0 throughout and a pain level of 0 following the procedure. Post Debridement Measurements: 4.4cm length x 5.8cm width x 0.2cm depth; 4.009cm^3 volume. Character of Wound/Ulcer Post Debridement is stable. Post procedure Diagnosis Wound #1: Same as Pre-Procedure Wound #2 Pre-procedure diagnosis of Wound #2 is a Calciphylaxis located on the Left,Medial Upper Leg . There was a Excisional Skin/Subcutaneous Tissue Debridement with a total area of 8.4 sq cm performed by STONE  III, Kartier Bennison E., PA-C. With the following instrument(s): Curette. to remove Viable and Non-Viable tissue/material Material removed includes Subcutaneous Tissue, and Slough, Biofilm, and Slough after achieving pain control using Other (lidiocaine 4%). No specimens were taken. A time out was conducted at 09:04, prior to the start  of the procedure. A Moderate amount of bleeding was controlled with Pressure. The procedure was tolerated well with a pain level of 0 throughout and a pain level of 0 following the procedure. Post Debridement Measurements: 2cm length x 4.2cm width x 0.2cm depth; 1.319cm^3 volume. Character of Wound/Ulcer Post Debridement is stable. Post procedure Diagnosis Wound #2: Same as Pre-Procedure Plan Wound Cleansing: Wound #1 Right,Anterior Upper Leg: Clean wound with Normal Saline. Wound #2 Left,Medial Upper Leg: Clean wound with Normal Saline. Anesthetic (add to Medication List): Wound #1 Right,Anterior Upper Leg: Topical Lidocaine 4% cream applied to wound bed prior to debridement (In Clinic Only). Wound #2 Left,Medial Upper Leg: Topical Lidocaine 4% cream applied to wound bed prior to debridement (In Clinic Only). Skin Barriers/Peri-Wound Care: Wound #1 Right,Anterior Upper Leg: Skin Prep Wound #2 Left,Medial Upper Leg: [name], [name] ([id number]) Skin Prep Primary Wound Dressing: Wound #1 Right,Anterior Upper Leg: Other: - silvercell Wound #2 Left,Medial Upper Leg: Other: - silvercell Secondary Dressing: Wound #1 Right,Anterior Upper Leg: Boardered Foam Dressing Wound #2 Left,Medial Upper Leg: Boardered Foam Dressing Dressing Change Frequency: Wound #1 Right,Anterior Upper Leg: Change dressing every other day. Wound #2 Left,Medial Upper Leg: Change dressing every other day. Follow-up Appointments: Wound #1 Right,Anterior Upper Leg: Return Appointment in 1 week. Wound #2 Left,Medial Upper Leg: Return Appointment in 1 week. Overall I'm very pleased with how  things seem to be Post debridement for the patient today and I do think this is something that will likely progress nicely. With that being said I would like to see him in a week to see you where we stand in that regard. Patient is in agreement with this plan. Please see above for specific wound care orders. We will see patient for re-evaluation in 1 week(s) here in the clinic. If anything worsens or changes patient will contact our office for additional recommendations. Electronic Signature(s) Signed: 12/03/2017 7:50:29 PM By: Lenda Kelp PA-C Entered By: Lenda Kelp on 12/03/2017 19:40:20 [name], [name] ([id number]) -------------------------------------------------------------------------------- ROS/PFSH Details Patient Name: [name] Date of Service: 12/03/2017 8:00 AM Medical Record Number: [id number] Patient Account Number: [id number] Date of Birth/Sex: [birth year] (36 [name].o. Male) Treating RN: Curtis Sites Primary Care Provider: Abner Greenspan Other Clinician: Referring Provider: Rory Percy Treating Provider/Extender: Linwood Dibbles, Kendallyn Lippold Weeks in Treatment: 0 Information Obtained From Patient Wound History Do you currently have one or more open woundso Yes How many open wounds do you currently haveo 2 Approximately how long have you had your woundso 6 months How have you been treating your wound(s) until nowo triamcinalone Has your wound(s) ever healed and then re-openedo No Have you had any lab work done in the past montho No Have you tested positive for an antibiotic resistant organism (MRSA, VRE)o No Have you tested positive for osteomyelitis (bone infection)o No Have you had any tests for circulation on your legso No Genitourinary Complaints and Symptoms: Positive for: Kidney failure/ Dialysis - Sept 2018 Medical History: Positive for: End Stage Renal Disease Constitutional Symptoms (General Health) Complaints and Symptoms: No Complaints or  Symptoms Eyes Complaints and Symptoms: No Complaints or Symptoms Ear/Nose/Mouth/Throat Complaints and Symptoms: No Complaints or Symptoms Hematologic/Lymphatic Complaints and Symptoms: No Complaints or Symptoms Medical History: Positive for: Anemia Negative for: Hemophilia; Human Immunodeficiency Virus; Lymphedema; Sickle Cell Disease Respiratory Complaints and Symptoms: No Complaints or Symptoms Broady, [name] ([id number]) Medical History: Positive for: Asthma - history Negative for: Aspiration; Chronic Obstructive Pulmonary Disease (COPD); Pneumothorax; Sleep Apnea; Tuberculosis Cardiovascular Complaints and Symptoms: No Complaints  or Symptoms Medical History: Positive for: Hypertension Gastrointestinal Complaints and Symptoms: No Complaints or Symptoms Medical History: Negative for: Cirrhosis ; Colitis; Crohnos; Hepatitis A; Hepatitis B; Hepatitis C Endocrine Complaints and Symptoms: No Complaints or Symptoms Immunological Complaints and Symptoms: No Complaints or Symptoms Medical History: Negative for: Lupus Erythematosus; Raynaudos; Scleroderma Integumentary (Skin) Complaints and Symptoms: No Complaints or Symptoms Musculoskeletal Complaints and Symptoms: No Complaints or Symptoms Medical History: Negative for: Gout; Rheumatoid Arthritis; Osteoarthritis; Osteomyelitis Neurologic Complaints and Symptoms: No Complaints or Symptoms Medical History: Negative for: Dementia; Neuropathy Oncologic Complaints and Symptoms: No Complaints or Symptoms Medical HistoryMAXFIELD, [name] ([id number]) Negative for: Received Chemotherapy; Received Radiation Psychiatric Complaints and Symptoms: No Complaints or Symptoms Immunizations Pneumococcal Vaccine: Received Pneumococcal Vaccination: Yes Implantable Devices Family and Social History Cancer: No; Diabetes: Yes - Maternal Grandparents; Heart Disease: Yes - Maternal Grandparents; Hereditary Spherocytosis: No;  Hypertension: Yes - Maternal Grandparents; Kidney Disease: No; Lung Disease: No; Seizures: No; Stroke: Yes - Maternal Grandparents; Thyroid Problems: No; Tuberculosis: No; Never smoker; Marital Status - Single; Alcohol Use: Never; Drug Use: No History; Caffeine Use: Daily; Advanced Directives: No; Patient does not want information on Advanced Directives Electronic Signature(s) Signed: 12/03/2017 7:50:29 PM By: Lenda Kelp PA-C Signed: 12/04/2017 4:18:46 PM By: Curtis Sites Entered By: Curtis Sites on 12/03/2017 08:18:41 [name], [name] ([id number]) -------------------------------------------------------------------------------- SuperBill Details Patient Name: [name] Date of Service: 12/03/2017 Medical Record Number: [id number] Patient Account Number: [id number] Date of Birth/Sex: [birth year] (36 [name].o. 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 I10 Essential (primary) hypertension I50.33 Acute on chronic diastolic (congestive) heart failure Facility Procedures CPT4 Code: 84132440 Description: 10272 - WOUND CARE VISIT-LEV 2 EST PT Modifier: Quantity: 1 CPT4 Code: [id number] Description: 11042 - DEB SUBQ TISSUE 20 SQ CM/< ICD-10 Diagnosis Description L97.122 Non-pressure chronic ulcer of left thigh with fat layer expo L97.112 Non-pressure chronic ulcer of right thigh with fat layer exp Modifier: sed osed Quantity: 1 CPT4 Code: [id number] Description: 11045 - DEB SUBQ TISS EA ADDL 20CM ICD-10 Diagnosis Description L97.122 Non-pressure chronic ulcer of left thigh with fat layer expo L97.112 Non-pressure  chronic ulcer of right thigh with fat layer exp Modifier: sed osed Quantity: 1 Physician Procedures CPT4 Code: [id number] Description: 99204 - WC PHYS LEVEL 4 - NEW PT ICD-10 Diagnosis Description 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 L97.122 Non-pressure chronic ulcer of left thigh with fat layer expo L97.112 Non-pressure chronic ulcer of right thigh with fat layer  exp I50.33 Acute on chronic diastolic (congestive) heart failure Modifier: 25 sed osed Quantity: 1 CPT4 Code: [id number] Description: 11042 - WC PHYS SUBQ TISS 20 SQ CM ICD-10 Diagnosis Description L97.122 Non-pressure chronic ulcer of left thigh with fat layer expo L97.112 Non-pressure chronic ulcer of right thigh with fat layer exp Modifier: sed osed Quantity: 1 CPT4 Code: [id number] [name], Sylvestre (0 Description: 11045 - WC PHYS SUBQ TISS EA ADDL 20 CM ICD-10 Diagnosis Description L97.122 Non-pressure chronic ulcer of left thigh with fat layer expo [id number]) Modifier: sed Quantity: 1 Electronic Signature(s) Signed: 12/03/2017 7:50:29 PM By: Lenda Kelp PA-C Entered By: Lenda Kelp on 12/03/2017 19:40:50

## 2017-12-10 ENCOUNTER — Encounter: Payer: Medicaid Other | Admitting: Physician Assistant

## 2017-12-10 DIAGNOSIS — L97122 Non-pressure chronic ulcer of left thigh with fat layer exposed: Secondary | ICD-10-CM | POA: Diagnosis not present

## 2017-12-11 NOTE — Progress Notes (Signed)
[name], [name] ([id number]) Visit Report for 12/03/2017 Allergy List Details Patient Name: [name], [name] Date of Service: 12/03/2017 8:00 AM Medical Record Number: [id number] Patient Account Number: [id number] Date of Birth/Sex: 08-07-1981 (37 y.o. M) Treating RN: Curtis Sites Primary Care Salvador Coupe: Abner Greenspan Other Clinician: Referring Dacia Capers: Abner Greenspan Treating Timoteo Carreiro/Extender: STONE III, HOYT Weeks in Treatment: 0 Allergies Active Allergies No Known Allergies Allergy Notes Electronic Signature(s) Signed: 12/04/2017 4:18:46 PM By: Curtis Sites Entered By: Curtis Sites on 12/03/2017 08:12:57 Meggison, [name] ([id number]) -------------------------------------------------------------------------------- Arrival Information Details Patient Name: [name] Date of Service: 12/03/2017 8:00 AM Medical Record Number: [id number] Patient Account Number: [id number] Date of Birth/Sex: Jan 04, 1981 (37 y.o. M) Treating RN: Curtis Sites Primary Care Inigo Lantigua: Abner Greenspan Other Clinician: Referring Caylor Cerino: Abner Greenspan Treating Byan Poplaski/Extender: Linwood Dibbles, HOYT Weeks in Treatment: 0 Visit Information Patient Arrived: Ambulatory Arrival Time: 08:09 Accompanied By: self Transfer Assistance: None Patient Identification Verified: Yes Secondary Verification Process Yes Completed: Patient Has Alerts: Yes Patient Alerts: Patient on Blood Thinner Eliquis Electronic Signature(s) Signed: 12/04/2017 4:18:46 PM By: Curtis Sites Entered By: Curtis Sites on 12/03/2017 08:09:47 [place], [name] ([id number]) -------------------------------------------------------------------------------- Clinic Level of Care Assessment Details Patient Name: [name] Date of Service: 12/03/2017 8:00 AM Medical Record Number: [id number] Patient Account Number: [id number] Date of Birth/Sex: [birth year] (37 y.o. M) Treating RN: Renne Crigler Primary Care Osmar Howton: Abner Greenspan Other Clinician: Referring  Lashanti Chambless: Abner Greenspan Treating [place] Wanner/Extender: STONE III, HOYT Weeks in Treatment: 0 Clinic Level of Care Assessment Items TOOL 1 Quantity Score X - Use when EandM and Procedure is performed on INITIAL visit 1 0 ASSESSMENTS - Nursing Assessment / Reassessment X - General Physical Exam (combine w/ comprehensive assessment (listed just below) when 1 20 performed on new pt. evals) X- 1 25 Comprehensive Assessment (HX, ROS, Risk Assessments, Wounds Hx, etc.) ASSESSMENTS - Wound and Skin Assessment / Reassessment  - Dermatologic / Skin Assessment (not related to wound area) 0 ASSESSMENTS - Ostomy and/or Continence Assessment and Care  - Incontinence Assessment and Management 0  - 0 Ostomy Care Assessment and Management (repouching, etc.) PROCESS - Coordination of Care  - Simple Patient / Family Education for ongoing care 0 X- 1 20 Complex (extensive) Patient / Family Education for ongoing care  - 0 Staff obtains Chiropractor, Records, Test Results / Process Orders  - 0 Staff telephones HHA, Nursing Homes / Clarify orders / etc  - 0 Routine Transfer to another Facility (non-emergent condition)  - 0 Routine Hospital Admission (non-emergent condition)  - 0 New Admissions / Manufacturing engineer / Ordering NPWT, Apligraf, etc.  - 0 Emergency Hospital Admission (emergent condition) PROCESS - Special Needs  - Pediatric / Minor Patient Management 0  - 0 Isolation Patient Management  - 0 Hearing / Language / Visual special needs  - 0 Assessment of Community assistance (transportation, D/C planning, etc.)  - 0 Additional assistance / Altered mentation  - 0 Support Surface(s) Assessment (bed, cushion, seat, etc.) Vickers, [name] ([id number]) INTERVENTIONS - Miscellaneous  - External ear exam 0  - 0 Patient Transfer (multiple staff / Nurse, adult / Similar devices)  - 0 Simple Staple / Suture removal (25 or less)  - 0 Complex Staple / Suture  removal (26 or more)  - 0 Hypo/Hyperglycemic Management (do not check if billed separately)  - 0 Ankle / Brachial Index (ABI) - do not check if billed separately Has the patient been seen at the hospital within the last three years: Yes Total Score: 65 Level Of Care: New/Established -  Level 2 Electronic Signature(s) Signed: 12/03/2017 4:40:07 PM By: Renne Crigler Entered By: Renne Crigler on 12/03/2017 09:12:12 [name], [name] ([id number]) -------------------------------------------------------------------------------- Encounter Discharge Information Details Patient Name: [name] Date of Service: 12/03/2017 8:00 AM Medical Record Number: [id number] Patient Account Number: [id number] Date of Birth/Sex: June 08, 1981 (37 y.o. M) Treating RN: Renne Crigler Primary Care Kortnie Stovall: Abner Greenspan Other Clinician: Referring Palin Tristan: Abner Greenspan Treating Isis Costanza/Extender: Linwood Dibbles, HOYT Weeks in Treatment: 0 Encounter Discharge Information Items Discharge Pain Level: 0 Discharge Condition: Stable Ambulatory Status: Ambulatory Discharge Destination: Nursing Home Transportation: Private Auto Accompanied By: self Schedule Follow-up Appointment: Yes Medication Reconciliation completed and No provided to Patient/Care Kahiau Schewe: Provided on Clinical Summary of Care: 12/03/2017 Form Type Recipient Paper Patient JW Electronic Signature(s) Signed: 12/03/2017 9:24:11 AM By: Alejandro Mulling Entered By: Alejandro Mulling on 12/03/2017 09:24:10 Sorg, [name] ([id number]) -------------------------------------------------------------------------------- Lower Extremity Assessment Details Patient Name: [name] Date of Service: 12/03/2017 8:00 AM Medical Record Number: [id number] Patient Account Number: [id number] Date of Birth/Sex: 12-04-1980 (37 y.o. M) Treating RN: Curtis Sites Primary Care Veona Bittman: Abner Greenspan Other Clinician: Referring Aydrien Froman: Abner Greenspan Treating  Lille Karim/Extender: STONE III, HOYT Weeks in Treatment: 0 Vascular Assessment Pulses: Posterior Tibial Extremity colors, hair growth, and conditions: Extremity Color: [Left:Mottled] [Right:Mottled] Hair Growth on Extremity: [Left:Yes] [Right:Yes] Temperature of Extremity: [Left:Warm] [Right:Warm] Electronic Signature(s) Signed: 12/03/2017 8:39:06 AM By: Curtis Sites Entered By: Curtis Sites on 12/03/2017 08:39:06 [place], [name] ([id number]) -------------------------------------------------------------------------------- Multi Wound Chart Details Patient Name: [name] Date of Service: 12/03/2017 8:00 AM Medical Record Number: [id number] Patient Account Number: [id number] Date of Birth/Sex: 06-Sep-1980 (37 y.o. 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] [N/A:N/A] Comorbid History: [1:Anemia, Asthma, Hypertension, End Stage Renal Disease] [2:Anemia, Asthma, Hypertension, End Stage Renal Disease] [N/A:N/A] Date Acquired: [1:04/23/2017] [2:04/23/2017] [N/A:N/A] Weeks of Treatment: [1:0] [2:0] [N/A:N/A] Wound Status: [1:Open] [2:Open] [N/A:N/A] Measurements L x W x D [1:4.4x5.8x0.1] [2:2x4.2x0.1] [N/A:N/A] (cm) Area (cm) : [1:20.043] [2:6.597] [N/A:N/A] Volume (cm) : [1:2.004] [2:0.66] [N/A:N/A] Classification: [1:Full Thickness Without Exposed Support Structures] [2:Full Thickness Without Exposed Support Structures]  [N/A:N/A] Exudate Amount: [1:Large] [2:Large] [N/A:N/A] Exudate Type: [1:Serous] [2:Serous] [N/A:N/A] Exudate Color: [1:amber] [2:amber] [N/A:N/A] Wound Margin: [1:Flat and Intact] [2:Flat and Intact] [N/A:N/A] Granulation Amount: [1:Large (67-100%)] [2:Large (67-100%)] [N/A:N/A] Granulation Quality: [1:Hyper-granulation, Friable] [2:Hyper-granulation, Friable] [N/A:N/A] Necrotic Amount: [1:Small (1-33%)] [2:None Present (0%)] [N/A:N/A] Exposed Structures: [1:Fat Layer (Subcutaneous Tissue) Exposed: Yes Fascia: No Tendon: No Muscle: No Joint: No Bone: No] [2:Fat Layer (Subcutaneous Tissue) Exposed: Yes Fascia: No Tendon: No Muscle: No Joint: No Bone: No] [N/A:N/A] Epithelialization: [1:None] [2:None] [N/A:N/A] Periwound Skin Texture: [1:Excoriation: No Induration: No Callus: No Crepitus: No Rash: No Scarring: No] [2:Excoriation: No Induration: No Callus: No Crepitus: No Rash: No Scarring: No] [N/A:N/A] Periwound Skin Moisture: Maceration: No Maceration: No N/A Dry/Scaly: No Dry/Scaly: No Periwound Skin Color: Atrophie Blanche: No Atrophie Blanche: No N/A Cyanosis: No Cyanosis: No Ecchymosis: No Ecchymosis: No Erythema: No Erythema: No Hemosiderin Staining: No Hemosiderin Staining: No Mottled: No Mottled: No Pallor: No Pallor: No Rubor: No Rubor: No Temperature: No Abnormality No Abnormality N/A Tenderness on Palpation: Yes No N/A Wound Preparation: Ulcer Cleansing:  Ulcer Cleansing: N/A Rinsed/Irrigated with Saline Rinsed/Irrigated with Saline Topical Anesthetic Applied: Topical Anesthetic Applied: Other: lidocaine 4% Other: lidocaine 4% Treatment Notes Electronic Signature(s) Signed: 12/03/2017 4:40:07 PM By: Renne Crigler Entered By: Renne Crigler on 12/03/2017 08:55:49 Sassone, [name] ([id number]) -------------------------------------------------------------------------------- Multi-Disciplinary Care Plan Details Patient Name: [name] Date of Service:  12/03/2017 8:00 AM Medical Record Number: [id number] Patient Account Number: [id number] Date of Birth/Sex: 1980-10-08 (37 y.o. M) Treating RN: Renne Crigler Primary Care Skylur Fuston: Abner Greenspan Other Clinician: Referring Karnell Vanderloop: Abner Greenspan Treating [place] Spainhower/Extender: STONE III, HOYT Weeks in Treatment: 0 Active Inactive ` Orientation to the Wound Care Program Nursing Diagnoses: Knowledge deficit related to the wound healing center program Goals: Patient/caregiver will verbalize understanding of the Wound Healing Center Program Date Initiated: 12/03/2017 Target Resolution Date: 12/24/2017 Goal Status: Active Interventions: Provide education on orientation to the wound center Notes: ` Wound/Skin Impairment Nursing Diagnoses: Impaired tissue integrity Goals: Patient/caregiver will verbalize understanding of skin care regimen Date Initiated: 12/03/2017 Target Resolution Date: 12/24/2017 Goal Status: Active Ulcer/skin breakdown will have a volume reduction of 30% by week 4 Date Initiated: 12/03/2017 Target Resolution Date: 12/24/2017 Goal Status: Active Interventions: Assess patient/caregiver ability to obtain necessary supplies Assess patient/caregiver ability to perform ulcer/skin care regimen upon admission and as needed Assess ulceration(s) every visit Treatment Activities: Skin care regimen initiated : 12/03/2017 Notes: Electronic Signature(s) Signed: 12/03/2017 4:40:07 PM By: French Ana, [name] ([id number]) Entered By: Renne Crigler on 12/03/2017 08:55:31 Phillis, [place] [name] ([id number]) -------------------------------------------------------------------------------- Pain Assessment Details Patient Name: [name] Date of Service: 12/03/2017 8:00 AM Medical Record Number: [id number] Patient Account Number: [id number] Date of Birth/Sex: [birth year] (37 y.o. M) Treating RN: Curtis Sites Primary Care Andros Channing: Abner Greenspan Other Clinician: Referring Denee Boeder:  Abner Greenspan Treating [name]/Extender: STONE III, HOYT Weeks in Treatment: 0 Active Problems Location of Pain Severity and Description of Pain Patient Has Paino No Site Locations With Dressing Change: Yes Duration of the Pain. Constant / Intermittento Intermittent Pain Management and Medication Current Pain Management: Electronic Signature(s) Signed: 12/04/2017 4:18:46 PM By: Curtis Sites Entered By: Curtis Sites on 12/03/2017 08:10:07 [place], [name] ([id number]) -------------------------------------------------------------------------------- Patient/Caregiver Education Details Patient Name: [name] Date of Service: 12/03/2017 8:00 AM Medical Record Number: [id number] Patient Account Number: [id number] Date of Birth/Gender: 11/11/1980 (37 y.o. M) Treating RN: Phillis Haggis Primary Care Physician: Abner Greenspan Other Clinician: Referring Physician: Abner Greenspan Treating Physician/Extender: Linwood Dibbles, HOYT Weeks in Treatment: 0 Education Assessment Education Provided To: Patient Education Topics Provided Welcome To The Wound Care Center: Handouts: Welcome To The Wound [facility] Methods: Explain/Verbal Responses: State content correctly Wound/Skin Impairment: Handouts: Caring for Your Ulcer, Other: change dressing as ordered Methods: Demonstration, Explain/Verbal Responses: State content correctly Electronic Signature(s) Signed: 12/07/2017 4:29:16 PM By: Alejandro Mulling Entered By: Alejandro Mulling on 12/03/2017 09:24:33 [place], [name] ([id number]) -------------------------------------------------------------------------------- Wound Assessment Details Patient Name: [name] Date of Service: 12/03/2017 8:00 AM Medical Record Number: [id number] Patient Account Number: [id number] Date of Birth/Sex: 08/09/1981 (37 y.o. 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 - Anterior Wound Status: Open Wounding Event: Gradually Appeared Comorbid Anemia, Asthma, Hypertension, End Stage Date Acquired: 04/23/2017 History: Renal Disease Weeks Of Treatment: 0 Clustered Wound: No Photos Photo Uploaded By: Curtis Sites on 12/03/2017 12:43:26 Wound Measurements Length: (cm) 4.4 Width: (cm) 5.8 Depth: (cm) 0.1 Area: (cm) 20.043 Volume: (cm)  2.004 % Reduction in Area: % Reduction in Volume: Epithelialization: None Tunneling: No Undermining: No Wound Description Full Thickness Without Exposed Support Classification: Structures Wound Margin: Flat and Intact Exudate Large Amount: Exudate Type: Serous Exudate Color: amber Foul Odor After Cleansing: No Slough/Fibrino Yes Wound Bed Granulation Amount: Large (67-100%) Exposed Structure Granulation Quality: Hyper-granulation, Friable Fascia Exposed: No Necrotic Amount: Small (1-33%) Fat Layer (Subcutaneous Tissue) Exposed: Yes Necrotic Quality: Adherent Slough Tendon Exposed: No Muscle Exposed: No Joint Exposed: No Bone Exposed: No [name], [name] ([id number]) Periwound Skin Texture Texture Color No Abnormalities Noted: No No Abnormalities Noted: No Callus: No Atrophie Blanche: No Crepitus: No Cyanosis: No Excoriation: No Ecchymosis: No Induration: No Erythema: No Rash: No Hemosiderin Staining: No Scarring: No Mottled: No Pallor: No Moisture Rubor: No No Abnormalities Noted: No Dry / Scaly: No Temperature / Pain Maceration: No Temperature: No Abnormality Tenderness on Palpation: Yes Wound Preparation Ulcer Cleansing: Rinsed/Irrigated with Saline Topical Anesthetic Applied: Other: lidocaine 4%, Electronic Signature(s) Signed: 12/04/2017 4:18:46 PM By: Curtis Sites Entered By: Curtis Sites on 12/03/2017 08:26:27 [name], [name]  ([id number]) -------------------------------------------------------------------------------- Wound Assessment Details Patient Name: [name] Date of Service: 12/03/2017 8:00 AM Medical Record Number: [id number] Patient Account Number: [id number] Date of Birth/Sex: 1980/10/31 (37 y.o. M) Treating RN: Curtis Sites Primary Care Chelsi Warr: Abner Greenspan Other Clinician: Referring Chihiro Frey: Abner Greenspan Treating Kamariyah Timberlake/Extender: STONE III, HOYT Weeks in Treatment: 0 Wound Status Wound Number: 2 Primary Calciphylaxis Etiology: Wound Location: Left Upper Leg - Medial Wound Status: Open Wounding Event: Gradually Appeared Comorbid Anemia, Asthma, Hypertension, End Stage Date Acquired: 04/23/2017 History: Renal Disease Weeks Of Treatment: 0 Clustered Wound: No Photos Photo Uploaded By: Curtis Sites on 12/03/2017 12:43:27 Wound Measurements Length: (cm) 2 Width: (cm) 4.2 Depth: (cm) 0.1 Area: (cm) 6.597 Volume: (cm) 0.66 % Reduction in Area: % Reduction in Volume: Epithelialization: None Tunneling: No Undermining: No Wound Description Full Thickness Without Exposed Support Classification: Structures Wound Margin: Flat and Intact Exudate Large Amount: Exudate Type: Serous Exudate Color: amber Foul Odor After Cleansing: No Slough/Fibrino Yes Wound Bed Granulation Amount: Large (67-100%) Exposed Structure Granulation Quality: Hyper-granulation, Friable Fascia Exposed: No Necrotic Amount: None Present (0%) Fat Layer (Subcutaneous Tissue) Exposed: Yes Tendon Exposed: No Muscle Exposed: No Joint Exposed: No Bone Exposed: No Machi, Hershell ([id number]) Periwound Skin Texture Texture Color No Abnormalities Noted: No No Abnormalities Noted: No Callus: No Atrophie Blanche: No Crepitus: No Cyanosis: No Excoriation: No Ecchymosis: No Induration: No Erythema: No Rash: No Hemosiderin Staining: No Scarring: No Mottled: No Pallor: No Moisture Rubor: No No  Abnormalities Noted: No Dry / Scaly: No Temperature / Pain Maceration: No Temperature: No Abnormality Wound Preparation Ulcer Cleansing: Rinsed/Irrigated with Saline Topical Anesthetic Applied: Other: lidocaine 4%, Electronic Signature(s) Signed: 12/04/2017 4:18:46 PM By: Curtis Sites Entered By: Curtis Sites on 12/03/2017 08:27:37 [place], [name] ([id number]) -------------------------------------------------------------------------------- Vitals Details Patient Name: [name] Date of Service: 12/03/2017 8:00 AM Medical Record Number: [id number] Patient Account Number: [id number] Date of Birth/Sex: 09/28/1980 (37 y.o. M) Treating RN: Curtis Sites Primary Care Pearlie Nies: Abner Greenspan Other Clinician: Referring Irie Dowson: Abner Greenspan Treating Leeana Creer/Extender: STONE III, HOYT Weeks in Treatment: 0 Vital Signs Time Taken: 08:10 Pulse (bpm): 67 Height (in): 70 Respiratory Rate (breaths/min): 18 Source: Measured Blood Pressure (mmHg): 117/77 Weight (lbs): 245 Reference Range: 80 - 120 mg / dl Source: Measured Body Mass Index (BMI): 35.2 Electronic Signature(s) Signed: 12/04/2017 4:18:46 PM By: Curtis Sites Entered By: Curtis Sites on 12/03/2017 53:66:44

## 2017-12-13 NOTE — Progress Notes (Signed)
[name], [name] ([id number]) Visit Report for 12/10/2017 Chief Complaint Document Details Patient Name: Brian, [name] Date of Service: 12/10/2017 8:30 AM Medical Record Number: [id number] Patient Account Number: [id number] Date of Birth/Sex: 23-May-1981 (37 y.o. M) Treating RN: Renne Crigler Primary Care Provider: Abner Greenspan Other Clinician: Referring Provider: Abner Greenspan Treating Provider/Extender: Linwood Dibbles, HOYT Weeks in Treatment: 1 Information Obtained from: Patient Chief Complaint Bilateral thigh Calciphylaxis Ulcers Electronic Signature(s) Signed: 12/11/2017 8:17:44 AM By: Lenda Kelp PA-C Entered By: Lenda Kelp on 12/10/2017 08:23:12 [name], [name] ([id number]) -------------------------------------------------------------------------------- HPI Details Patient Name: [name] Date of Service: 12/10/2017 8:30 AM Medical Record Number: [id number] Patient Account Number: [id number] Date of Birth/Sex: 12/06/1980 (37 y.o. 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 He has currently been undergoing treatment according to notes reviewed from 11/28/17 for this. 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 Subsequently patient was using Santyl for very long time but is no longer using that at this point. He is receiving sodium thiosulfate during hemodialysis along with Sevelamer to help control his phosphorus levels. Currently a steroid cream was recommended for the wounds although he has not used that yet. He a has what appears to be extremely hyper granular and poorly  attached granulation noted over the bed of the wound. This is true of both wounds of lower extremities. He does show some signs of epithelialization around the border although this has been very slow according to the patient. Fortunately he is not having significant pain at this point. He does have a history of hypertension as well is congestive heart failure. Otherwise he states the wounds are much better than when I first started. 12/10/17 on evaluation today patient actually presents with signs of improvement in regard to his bilateral thigh ulcer. He has been tolerating the dressing changes without complication which is good news. With that being said is not really having a significant amount of bleeding at this point today is excellent. He has been pleased with the progress as well. There's no evidence of infection. Electronic Signature(s) Signed: 12/11/2017 8:17:44 AM By: Lenda Kelp PA-C Entered By: Lenda Kelp on 12/10/2017 08:33:51 [name], [name] ([id number]) -------------------------------------------------------------------------------- Gaynelle Adu TISS Details Patient Name: [name] Date of Service: 12/10/2017 8:30 AM Medical Record Number: [id number] Patient Account Number: [id number] Date of Birth/Sex: 1981-04-05 (37 y.o. M) Treating RN: Renne Crigler Primary Care Provider: Abner Greenspan Other Clinician: Referring Provider: Abner Greenspan Treating Provider/Extender: Linwood Dibbles, HOYT Weeks in Treatment: 1 Procedure Performed for: Wound #1 Right,Anterior Upper Leg Performed By: Physician Trellis Paganini., PA-C Post Procedure Diagnosis Same as Pre-procedure Electronic Signature(s) Signed: 12/11/2017 8:17:44 AM By: Lenda Kelp PA-C Entered By: Lenda Kelp on 12/10/2017 08:37:14 [name], [name] -------------------------------------------------------------------------------- Gaynelle Adu TISS Details Patient Name: [name] Date of  Service: 12/10/2017 8:30 AM Medical Record Number: [id number] Patient Account Number: [id number] Date of Birth/Sex: 1981-02-24 (37 y.o. M) Treating RN: Renne Crigler Primary Care Provider: Abner Greenspan Other Clinician: Referring Provider: Abner Greenspan Treating Provider/Extender: STONE III, HOYT Weeks in Treatment: 1 Procedure Performed for: Wound #2 Left,Medial Upper Leg Performed By: Physician STONE III, HOYT E., PA-C Post Procedure Diagnosis  Same as Pre-procedure Electronic Signature(s) Signed: 12/11/2017 8:17:44 AM By: Lenda Kelp PA-C Entered By: Lenda Kelp on 12/10/2017 08:38:09 [name], [name] ([id number]) -------------------------------------------------------------------------------- Physical Exam Details Patient Name: [name] Date of Service: 12/10/2017 8:30 AM Medical Record Number: [id number] Patient Account Number: [id number] Date of Birth/Sex: 1981-01-03 (37 y.o. M) Treating RN: Renne Crigler Primary Care Provider: Abner Greenspan Other Clinician: Referring Provider: Abner Greenspan Treating Provider/Extender: STONE III, HOYT Weeks in Treatment: 1 Constitutional Well-nourished and well-hydrated in no acute distress. Respiratory normal breathing without difficulty. clear to auscultation bilaterally. Cardiovascular regular rate and rhythm with normal S1, S2. Psychiatric this patient is able to make decisions and demonstrates good insight into disease process. Alert and Oriented x 3. pleasant and cooperative. Notes Patient's wound does not appear to be as hyper granular today compared to the last evaluation. I did not really feel that debridement was necessary although I do think that continued treatment with silver nitrate would be appropriate this point at least until such time as we can get the wounds to flatten out and manage the hyper granular tissue. I do think Hydrofera Blue Dressing be appropriate in helping in this regard. Electronic  Signature(s) Signed: 12/11/2017 8:17:44 AM By: Lenda Kelp PA-C Entered By: Lenda Kelp on 12/10/2017 08:34:28 [name], [name] -------------------------------------------------------------------------------- Physician Orders Details Patient Name: [name] Date of Service: 12/10/2017 8:30 AM Medical Record Number: [id number] Patient Account Number: [id number] Date of Birth/Sex: 1981-01-04 (37 y.o. 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 Wound #2 Left,Medial Upper Leg o Clean wound with Normal Saline. Anesthetic (add to Medication List) Wound #1 Right,Anterior Upper Leg o Topical Lidocaine 4% cream applied to wound bed prior to debridement (In Clinic Only). Wound #2 Left,Medial Upper Leg o Topical Lidocaine 4% cream applied to wound bed prior to debridement (In Clinic Only). Skin Barriers/Peri-Wound Care Wound #1 Right,Anterior Upper Leg o Skin Prep o Other: - used silver nitrate in clinic Wound #2 Left,Medial Upper Leg o Skin Prep o Other: - used silver nitrate Primary Wound Dressing Wound #1 Right,Anterior Upper Leg o Hydrafera Blue Ready Transfer Wound #2 Left,Medial Upper Leg o Hydrafera Blue Ready Transfer Secondary Dressing Wound #1 Right,Anterior Upper Leg o Boardered Foam Dressing [place], Carzell ([id number]) Wound #2 Left,Medial Upper Leg o  Boardered Foam Dressing Dressing Change Frequency Wound #1 Right,Anterior Upper Leg o Change dressing every other day. Wound #2 Left,Medial Upper Leg o Change dressing every other day. Follow-up Appointments Wound #1 Right,Anterior Upper Leg o Return Appointment in 1 week. Wound #2 Left,Medial Upper Leg o Return Appointment in 1 week. Electronic Signature(s) Signed: 12/10/2017 5:23:19 PM By: Renne Crigler Signed: 12/11/2017 8:17:44 AM By: Lenda Kelp PA-C Entered By: Renne Crigler on 12/10/2017 08:32:17 [place], [name] ([id number]) -------------------------------------------------------------------------------- Problem List Details Patient Name: [name] Date of Service: 12/10/2017 8:30 AM Medical Record Number: [id number] Patient Account Number: [id number] Date of Birth/Sex: 1980/12/19 (36 y.o. 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 12/03/2017 Yes L97.112 Non-pressure chronic ulcer of right thigh with fat layer 12/03/2017 Yes exposed I10 Essential (primary) hypertension 12/03/2017 Yes I50.33 Acute on chronic diastolic (congestive) heart failure 12/03/2017 Yes Inactive Problems Resolved Problems Electronic Signature(s) Signed: 12/11/2017 8:17:44 AM By: Lenda Kelp PA-C Entered By: Lenda Kelp on 12/10/2017 08:23:04 [place], [name] ([id number]) -------------------------------------------------------------------------------- Progress Note Details Patient Name: [name] Date of Service: 12/10/2017 8:30 AM Medical Record Number: [id number] Patient Account Number: [id number] Date of Birth/Sex:  18-Jun-1981 (37 y.o. 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 He has currently been undergoing treatment according to notes reviewed from 11/28/17 for this. 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 Subsequently patient was using Santyl for very long time but is no longer using that at this point. He is receiving sodium thiosulfate during hemodialysis along with Sevelamer to help control his phosphorus levels. Currently a steroid cream was recommended for the wounds although he has not used that yet. He a has what appears to be extremely hyper granular and poorly attached granulation noted over the bed of the wound. This is true of both wounds of lower extremities. He does show some signs of epithelialization around the border although this has been very slow according to the patient. Fortunately he is not having significant pain at this point. He does have a history of hypertension as well is congestive heart failure. Otherwise he states the wounds are much better than when I first started. 12/10/17 on evaluation today patient actually presents with signs of improvement in regard to his bilateral thigh ulcer. He has been tolerating the dressing changes without complication which is good news. With that being said is not really having a significant amount of bleeding at this point today is excellent. He has been pleased with the progress as well. There's no evidence of infection. Patient  History Information obtained from Patient. Family History Diabetes - Maternal Grandparents, Heart Disease - Maternal Grandparents, Hypertension - Maternal Grandparents, Stroke - Maternal Grandparents, No family history of Cancer, Hereditary Spherocytosis, Kidney Disease, Lung Disease, Seizures, Thyroid Problems, Tuberculosis. Social History Never smoker, Marital Status - Single, Alcohol Use - Never, Drug Use - No History, Caffeine Use - Daily. Review of Systems (ROS) Constitutional Symptoms (General Health) Denies complaints or symptoms of Fever, Chills. Respiratory The patient has no complaints or symptoms. Cardiovascular The patient has no complaints or symptoms. Psychiatric The patient has no complaints or symptoms. [place], [place] ([id number]) Objective Constitutional Well-nourished and well-hydrated in no acute distress. Vitals Time Taken: 8:11 AM, Height: 70 in, Weight: 245 lbs, BMI: 35.2, Temperature: 98.1 F, Pulse: 61 bpm, Respiratory Rate: 16 breaths/min, Blood Pressure: 124/73 mmHg. Respiratory normal breathing without difficulty. clear to auscultation bilaterally. Cardiovascular regular rate and rhythm with normal S1, S2. Psychiatric this patient is able to make decisions and demonstrates good insight into disease process. Alert and Oriented x 3. pleasant and cooperative. General  Notes: Patient's wound does not appear to be as hyper granular today compared to the last evaluation. I did not really feel that debridement was necessary although I do think that continued treatment with silver nitrate would be appropriate this point at least until such time as we can get the wounds to flatten out and manage the hyper granular tissue. I do think Hydrofera Blue Dressing be appropriate in helping in this regard. Integumentary (Hair, Skin) Wound #1 status is Open. Original cause of wound was Gradually Appeared. The wound is located on the Right,Anterior Upper Leg. The wound  measures 4cm length x 5.2cm width x 0.1cm depth; 16.336cm^2 area and 1.634cm^3 volume. There is Fat Layer (Subcutaneous Tissue) Exposed exposed. There is no tunneling or undermining noted. There is a large amount of serous drainage noted. The wound margin is flat and intact. There is large (67-100%) hyper - granulation within the wound bed. There is a small (1-33%) amount of necrotic tissue within the wound bed including Adherent Slough. The periwound skin appearance did not exhibit: Callus, Crepitus, Excoriation, Induration, Rash, Scarring, Dry/Scaly, Maceration, Atrophie Blanche, Cyanosis, Ecchymosis, Hemosiderin Staining, Mottled, Pallor, Rubor, Erythema. Periwound temperature was noted as No Abnormality. The periwound has tenderness on palpation. Wound #2 status is Open. Original cause of wound was Gradually Appeared. The wound is located on the Left,Medial Upper Leg. The wound measures 1.5cm length x 4cm width x 0.1cm depth; 4.712cm^2 area and 0.471cm^3 volume. There is Fat Layer (Subcutaneous Tissue) Exposed exposed. There is no tunneling or undermining noted. There is a large amount of serous drainage noted. The wound margin is flat and intact. There is large (67-100%) hyper - granulation within the wound bed. There is a small (1-33%) amount of necrotic tissue within the wound bed including Adherent Slough. The periwound skin appearance did not exhibit: Callus, Crepitus, Excoriation, Induration, Rash, Scarring, Dry/Scaly, Maceration, Atrophie Blanche, Cyanosis, Ecchymosis, Hemosiderin Staining, Mottled, Pallor, Rubor, Erythema. Periwound temperature was noted as No Abnormality. Assessment Active Problems ICD-10 ROCKO, FESPERMAN ([id number]) E83.59 - Other disorders of calcium metabolism L97.122 - Non-pressure chronic ulcer of left thigh with fat layer exposed L97.112 - Non-pressure chronic ulcer of right thigh with fat layer exposed I10 - Essential (primary) hypertension I50.33 - Acute on  chronic diastolic (congestive) heart failure Procedures Wound #1 Pre-procedure diagnosis of Wound #1 is a Calciphylaxis located on the Right,Anterior Upper Leg . An CHEM CAUT GRANULATION TISS procedure was performed by STONE III, HOYT E., PA-C. Post procedure Diagnosis Wound #1: Same as Pre-Procedure Wound #2 Pre-procedure diagnosis of Wound #2 is a Calciphylaxis located on the Left,Medial Upper Leg . An CHEM CAUT GRANULATION TISS procedure was performed by STONE III, HOYT E., PA-C. Post procedure Diagnosis Wound #2: Same as Pre-Procedure Verbal informed consent: was obtained for chemical cauterization of hypergranulation tissue with silver nitrate. Risk and benefits were discussed with patient. Patient verbalized understanding that chemical cauterization is beneficial for managing hypergranulation tissue, and understands how hypergranulation tissue can prevent proper wound healing. The risk of the procedure includes but is not limited to damage of surrounding normally healing tissue. Procedure: Procedure time: 8:20 AM Topical anesthetic in the form of benzocaine spray was applied prior to the start of the procedure Utilizing three silver nitrate sticks, the area of hypergranulation of the bilateral thighs was chemically cauterized. I informed the patient as well nursing staff that the dark discoloration is a normal result of the silver nitrate product. Patient tolerated the procedure well with minimal discomfort. Plan Wound Cleansing:  Wound #1 Right,Anterior Upper Leg: Clean wound with Normal Saline. [place], [name] ([id number]) Wound #2 Left,Medial Upper Leg: Clean wound with Normal Saline. Anesthetic (add to Medication List): Wound #1 Right,Anterior Upper Leg: Topical Lidocaine 4% cream applied to wound bed prior to debridement (In Clinic Only). Wound #2 Left,Medial Upper Leg: Topical Lidocaine 4% cream applied to wound bed prior to debridement (In Clinic Only). Skin  Barriers/Peri-Wound Care: Wound #1 Right,Anterior Upper Leg: Skin Prep Other: - used silver nitrate in clinic Wound #2 Left,Medial Upper Leg: Skin Prep Other: - used silver nitrate Primary Wound Dressing: Wound #1 Right,Anterior Upper Leg: Hydrafera Blue Ready Transfer Wound #2 Left,Medial Upper Leg: Hydrafera Blue Ready Transfer Secondary Dressing: Wound #1 Right,Anterior Upper Leg: Boardered Foam Dressing Wound #2 Left,Medial Upper Leg: Boardered Foam Dressing Dressing Change Frequency: Wound #1 Right,Anterior Upper Leg: Change dressing every other day. Wound #2 Left,Medial Upper Leg: Change dressing every other day. Follow-up Appointments: Wound #1 Right,Anterior Upper Leg: Return Appointment in 1 week. Wound #2 Left,Medial Upper Leg: Return Appointment in 1 week. Currently I'm gonna suggest that we continue with the silver nitrate treatments until we get the hyper granular tissue under control. I will subsequently see the patient back for reevaluation in one weeks time for this. In the meantime gonna switch to [employer] which I think will be appropriate as well. We will see were things stand at follow-up. Please see above for specific wound care orders. We will see patient for re-evaluation in 1 week(s) here in the clinic. If anything worsens or changes patient will contact our office for additional recommendations. Electronic Signature(s) Signed: 12/11/2017 8:17:44 AM By: Lenda Kelp PA-C Entered By: Lenda Kelp on 12/10/2017 08:38:41 [place], [name] ([id number]) -------------------------------------------------------------------------------- ROS/PFSH Details Patient Name: [name] Date of Service: 12/10/2017 8:30 AM Medical Record Number: [id number] Patient Account Number: [id number] Date of Birth/Sex: 04-20-1981 (37 y.o. M) Treating RN: Renne Crigler Primary Care Provider: Abner Greenspan Other Clinician: Referring Provider: Abner Greenspan Treating  Provider/Extender: STONE III, HOYT Weeks in Treatment: 1 Information Obtained From Patient Wound History Do you currently have one or more open woundso Yes How many open wounds do you currently haveo 2 Approximately how long have you had your woundso 6 months How have you been treating your wound(s) until nowo triamcinalone Has your wound(s) ever healed and then re-openedo No Have you had any lab work done in the past montho No Have you tested positive for an antibiotic resistant organism (MRSA, VRE)o No Have you tested positive for osteomyelitis (bone infection)o No Have you had any tests for circulation on your legso No Constitutional Symptoms (General Health) Complaints and Symptoms: Negative for: Fever; Chills Hematologic/Lymphatic Medical History: Positive for: Anemia Negative for: Hemophilia; Human Immunodeficiency Virus; Lymphedema; Sickle Cell Disease Respiratory Complaints and Symptoms: No Complaints or Symptoms Medical History: Positive for: Asthma - history Negative for: Aspiration; Chronic Obstructive Pulmonary Disease (COPD); Pneumothorax; Sleep Apnea; Tuberculosis Cardiovascular Complaints and Symptoms: No Complaints or Symptoms Medical History: Positive for: Hypertension Gastrointestinal Medical History: Negative for: Cirrhosis ; Colitis; Crohnos; Hepatitis A; Hepatitis B; Hepatitis C Genitourinary [place], [name] ([id number]) Medical History: Positive for: End Stage Renal Disease Immunological Medical History: Negative for: Lupus Erythematosus; Raynaudos; Scleroderma Musculoskeletal Medical History: Negative for: Gout; Rheumatoid Arthritis; Osteoarthritis; Osteomyelitis Neurologic Medical History: Negative for: Dementia; Neuropathy Oncologic Medical History: Negative for: Received Chemotherapy; Received Radiation Psychiatric Complaints and Symptoms: No Complaints or Symptoms Immunizations Pneumococcal Vaccine: Received Pneumococcal Vaccination:  Yes Implantable Devices Family and Social History Cancer:  No; Diabetes: Yes - Maternal Grandparents; Heart Disease: Yes - Maternal Grandparents; Hereditary Spherocytosis: No; Hypertension: Yes - Maternal Grandparents; Kidney Disease: No; Lung Disease: No; Seizures: No; Stroke: Yes - Maternal Grandparents; Thyroid Problems: No; Tuberculosis: No; Never smoker; Marital Status - Single; Alcohol Use: Never; Drug Use: No History; Caffeine Use: Daily; Advanced Directives: No; Patient does not want information on Advanced Directives Physician Affirmation I have reviewed and agree with the above information. Electronic Signature(s) Signed: 12/10/2017 5:23:19 PM By: Renne Crigler Signed: 12/11/2017 8:17:44 AM By: Lenda Kelp PA-C Entered By: Lenda Kelp on 12/10/2017 08:34:11 [place], [name] ([id number]) -------------------------------------------------------------------------------- SuperBill Details Patient Name: [name] Date of Service: 12/10/2017 Medical Record Number: [id number] Patient Account Number: [id number] Date of Birth/Sex: [birth year] (36 y.o. 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 L97.112 Non-pressure chronic ulcer of right thigh with fat layer exposed I10 Essential (primary) hypertension I50.33 Acute on chronic diastolic (congestive) heart failure Facility Procedures CPT4 Code: [id number] Description: 17250 - CHEM CAUT GRANULATION TISS ICD-10 Diagnosis Description L97.122 Non-pressure chronic ulcer of left thigh with fat layer exp L97.112 Non-pressure chronic ulcer of right thigh with fat layer ex Modifier: osed posed Quantity: 2 Physician Procedures CPT4 Code:  [id number] Description: 17250 - WC PHYS CHEM CAUT GRAN TISSUE ICD-10 Diagnosis Description L97.122 Non-pressure chronic ulcer of left thigh with fat layer expo L97.112 Non-pressure chronic ulcer of right thigh with fat layer exp Modifier: sed osed Quantity: 2 Electronic Signature(s) Signed: 12/11/2017 8:17:44 AM By: Lenda Kelp PA-C Entered By: Lenda Kelp on 12/10/2017 08:39:01

## 2017-12-14 NOTE — Progress Notes (Signed)
DAVIED, NOCITO ([id number]) Visit Report for 12/10/2017 Arrival Information Details Patient Name: ALEXANDRE, FARIES Date of Service: 12/10/2017 8:30 AM Medical Record Number: [id number] Patient Account Number: [id number] Date of Birth/Sex: May 25, 1981 (37 y.o. M) Treating RN: Curtis Sites Primary Care Hason Ofarrell: Abner Greenspan Other Clinician: Referring Jahmier Willadsen: Abner Greenspan Treating Kamarrion Stfort/Extender: Linwood Dibbles, HOYT Weeks in Treatment: 1 Visit Information History Since Last Visit Added or deleted any medications: No Patient Arrived: Ambulatory Any new allergies or adverse reactions: No Arrival Time: 08:06 Had a fall or experienced change in No Accompanied By: self activities of daily living that may affect Transfer Assistance: None risk of falls: Patient Identification Verified: Yes Signs or symptoms of abuse/neglect since last visito No Secondary Verification Process Yes Hospitalized since last visit: No Completed: Implantable device outside of the clinic excluding No Patient Has Alerts: Yes cellular tissue based products placed in the center Patient Alerts: Patient on Blood since last visit: Thinner Has Dressing in Place as Prescribed: Yes Eliquis Pain Present Now: No Electronic Signature(s) Signed: 12/10/2017 4:56:01 PM By: Curtis Sites Entered By: Curtis Sites on 12/10/2017 08:10:11 Plass, [name] -------------------------------------------------------------------------------- Encounter Discharge Information Details Patient Name: [name] Date of Service: 12/10/2017 8:30 AM Medical Record Number: [id number] Patient Account Number: [id number] Date of Birth/Sex: 1981/02/21 (37 y.o. M) Treating RN: Renne Crigler Primary Care Treva Huyett: Abner Greenspan Other Clinician: Referring Kiyla Ringler: Abner Greenspan Treating Geneieve Duell/Extender: Linwood Dibbles, HOYT Weeks in Treatment: 1 Encounter Discharge Information Items Schedule Follow-up Appointment: No Medication  Reconciliation completed and No provided to Patient/Care Raneem Mendolia: Provided on Clinical Summary of Care: 12/10/2017 Form Type Recipient Paper Patient JW Electronic Signature(s) Signed: 12/12/2017 8:52:06 AM By: Gwenlyn Perking Entered By: Gwenlyn Perking on 12/10/2017 08:41:38 Farrugia, [place] ([id number]) -------------------------------------------------------------------------------- Lower Extremity Assessment Details Patient Name: [name] Date of Service: 12/10/2017 8:30 AM Medical Record Number: [id number] Patient Account Number: [id number] Date of Birth/Sex: Jun 28, 1981 (37 y.o. M) Treating RN: Curtis Sites Primary Care Yaremi Stahlman: Abner Greenspan Other Clinician: Referring Jasiel Belisle: Abner Greenspan Treating Charnel Giles/Extender: Linwood Dibbles, HOYT Weeks in Treatment: 1 Electronic Signature(s) Signed: 12/10/2017 4:56:01 PM By: Curtis Sites Entered By: Curtis Sites on 12/10/2017 08:18:16 Ortwein, Jomarie Longs ([id number]) -------------------------------------------------------------------------------- Multi Wound Chart Details Patient Name: [name] Date of Service: 12/10/2017 8:30 AM Medical Record Number: [id number] Patient Account Number: [id number] Date of Birth/Sex: 1981/03/01 (37 y.o. 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] [N/A:N/A] Comorbid History: [1:Anemia, Asthma, Hypertension, End Stage Renal Disease] [2:Anemia,  Asthma, Hypertension, End Stage Renal Disease] [N/A:N/A] Date Acquired: [1:04/23/2017] [2:04/23/2017] [N/A:N/A] Weeks of Treatment: [1:1] [2:1] [N/A:N/A] Wound Status: [1:Open] [2:Open] [N/A:N/A] Measurements L x W x D [1:4x5.2x0.1] [2:1.5x4x0.1] [N/A:N/A] (cm) Area (cm) : [1:16.336] [2:4.712] [N/A:N/A] Volume (cm) : [1:1.634] [2:0.471] [N/A:N/A] % Reduction in Area: [1:18.50%] [2:28.60%] [N/A:N/A] % Reduction in Volume: [1:18.50%] [2:28.60%] [N/A:N/A] Classification: [1:Full Thickness Without Exposed Support Structures] [2:Full Thickness Without Exposed Support Structures] [N/A:N/A] Exudate Amount: [1:Large] [2:Large] [N/A:N/A] Exudate Type: [1:Serous] [2:Serous] [N/A:N/A] Exudate Color: [1:amber] [2:amber] [N/A:N/A] Wound Margin: [1:Flat and Intact] [2:Flat and Intact] [N/A:N/A] Granulation Amount: [1:Large (67-100%)] [2:Large (67-100%)] [N/A:N/A] Necrotic Amount: [1:Small (1-33%)] [2:Small (1-33%)] [N/A:N/A] Exposed Structures: [1:Fat Layer (  Subcutaneous Tissue) Exposed: Yes Fascia: No Tendon: No Muscle: No Joint: No Bone: No] [2:Fat Layer (Subcutaneous Tissue) Exposed: Yes Fascia: No Tendon: No Muscle: No Joint: No Bone: No] [N/A:N/A] Epithelialization: [1:None] [2:None] [N/A:N/A] Periwound Skin Texture: [1:Excoriation: No Induration: No Callus: No Crepitus: No] [2:Excoriation: No Induration: No Callus: No Crepitus: No] [N/A:N/A] Rash: No Rash: No Scarring: No Scarring: No Periwound Skin Moisture: Maceration: No Maceration: No N/A Dry/Scaly: No Dry/Scaly: No Periwound Skin Color: Atrophie Blanche: No Atrophie Blanche: No N/A Cyanosis: No Cyanosis: No Ecchymosis: No Ecchymosis: No Erythema: No Erythema: No Hemosiderin Staining: No Hemosiderin Staining: No Mottled: No Mottled: No Pallor: No Pallor: No Rubor: No Rubor: No Temperature: No Abnormality No Abnormality N/A Tenderness on Palpation: Yes No N/A Wound Preparation: Ulcer Cleansing: Ulcer Cleansing:  N/A Rinsed/Irrigated with Saline Rinsed/Irrigated with Saline Topical Anesthetic Applied: Topical Anesthetic Applied: Other: lidocaine 4% Other: lidocaine 4% Treatment Notes Electronic Signature(s) Signed: 12/10/2017 5:23:19 PM By: Renne Crigler Entered By: Renne Crigler on 12/10/2017 08:24:13 Buescher, Jomarie Longs ([id number]) -------------------------------------------------------------------------------- Multi-Disciplinary Care Plan Details Patient Name: [name] Date of Service: 12/10/2017 8:30 AM Medical Record Number: [id number] Patient Account Number: [id number] Date of Birth/Sex: 08-Aug-1981 (37 y.o. M) Treating RN: Renne Crigler Primary Care Evie Croston: Abner Greenspan Other Clinician: Referring Chere Babson: Abner Greenspan Treating Shenequa Howse/Extender: Linwood Dibbles, HOYT Weeks in Treatment: 1 Active Inactive ` Orientation to the Wound Care Program Nursing Diagnoses: Knowledge deficit related to the wound healing center program Goals: Patient/caregiver will verbalize understanding of the Wound Healing Center Program Date Initiated: 12/03/2017 Target Resolution Date: 12/24/2017 Goal Status: Active Interventions: Provide education on orientation to the wound center Notes: ` Wound/Skin Impairment Nursing Diagnoses: Impaired tissue integrity Goals: Patient/caregiver will verbalize understanding of skin care regimen Date Initiated: 12/03/2017 Target Resolution Date: 12/24/2017 Goal Status: Active Ulcer/skin breakdown will have a volume reduction of 30% by week 4 Date Initiated: 12/03/2017 Target Resolution Date: 12/24/2017 Goal Status: Active Interventions: Assess patient/caregiver ability to obtain necessary supplies Assess patient/caregiver ability to perform ulcer/skin care regimen upon admission and as needed Assess ulceration(s) every visit Treatment Activities: Skin care regimen initiated : 12/03/2017 Notes: Electronic Signature(s) Signed: 12/10/2017 5:23:19 PM By: French Ana, [name] Entered By: Renne Crigler on 12/10/2017 08:24:05 [place], Jomarie Longs ([id number]) -------------------------------------------------------------------------------- Pain Assessment Details Patient Name: [name] Date of Service: 12/10/2017 8:30 AM Medical Record Number: [id number] Patient Account Number: [id number] Date of Birth/Sex: September 29, 1980 (37 y.o. M) Treating RN: Curtis Sites Primary Care Chemeka Filice: Abner Greenspan Other Clinician: Referring Arlo Butt: Abner Greenspan Treating Rykin Route/Extender: STONE III, HOYT Weeks in Treatment: 1 Active Problems Location of Pain Severity and Description of Pain Patient Has Paino No Site Locations Pain Management and Medication Current Pain Management: Electronic Signature(s) Signed: 12/10/2017 4:56:01 PM By: Curtis Sites Entered By: Curtis Sites on 12/10/2017 08:11:24 [name] ([id number]) -------------------------------------------------------------------------------- Wound Assessment Details Patient Name: [name] Date of Service: 12/10/2017 8:30 AM Medical Record Number: [id number] Patient Account Number: [id number] Date of Birth/Sex: 1981/06/04 (36 y.o. M) Treating RN: Curtis Sites Primary Care Mikaeel Petrow: Abner Greenspan Other Clinician: Referring Jilliann Subramanian: Abner Greenspan Treating Kier Smead/Extender: STONE III, HOYT Weeks in Treatment: 1 Wound Status Wound Number: 1 Primary Calciphylaxis Etiology: Wound Location: Right Upper Leg - Anterior Wound Status: Open Wounding Event: Gradually Appeared Comorbid Anemia, Asthma, Hypertension, End Stage Date Acquired: 04/23/2017 History: Renal Disease Weeks Of Treatment: 1 Clustered Wound: No Photos Photo Uploaded By: Curtis Sites on 12/10/2017 09:02:06 Wound Measurements Length: (cm) 4 Width: (cm) 5.2 Depth: (cm) 0.1 Area: (cm) 16.336 Volume: (cm) 1.634 %  Reduction in Area: 18.5% % Reduction in Volume: 18.5% Epithelialization: None Tunneling:  No Undermining: No Wound Description Full Thickness Without Exposed Support Classification: Structures Wound Margin: Flat and Intact Exudate Large Amount: Exudate Type: Serous Exudate Color: amber Foul Odor After Cleansing: No Slough/Fibrino Yes Wound Bed Granulation Amount: Large (67-100%) Exposed Structure Granulation Quality: Hyper-granulation Fascia Exposed: No Necrotic Amount: Small (1-33%) Fat Layer (Subcutaneous Tissue) Exposed: Yes Necrotic Quality: Adherent Slough Tendon Exposed: No Muscle Exposed: No Joint Exposed: No Bone Exposed: No Seelig, Akashdeep ([id number]) Periwound Skin Texture Texture Color No Abnormalities Noted: No No Abnormalities Noted: No Callus: No Atrophie Blanche: No Crepitus: No Cyanosis: No Excoriation: No Ecchymosis: No Induration: No Erythema: No Rash: No Hemosiderin Staining: No Scarring: No Mottled: No Pallor: No Moisture Rubor: No No Abnormalities Noted: No Dry / Scaly: No Temperature / Pain Maceration: No Temperature: No Abnormality Tenderness on Palpation: Yes Wound Preparation Ulcer Cleansing: Rinsed/Irrigated with Saline Topical Anesthetic Applied: Other: lidocaine 4%, Electronic Signature(s) Signed: 12/10/2017 4:56:01 PM By: Curtis Sites Entered By: Curtis Sites on 12/10/2017 08:17:47 Shappell, Krrish ([id number]) -------------------------------------------------------------------------------- Wound Assessment Details Patient Name: [name] Date of Service: 12/10/2017 8:30 AM Medical Record Number: [id number] Patient Account Number: [id number] Date of Birth/Sex: [birth year] (37 y.o. M) Treating RN: Curtis Sites Primary Care Eron Goble: Abner Greenspan Other Clinician: Referring Ricci Dirocco: Abner Greenspan Treating Ashleah Valtierra/Extender: STONE III, HOYT Weeks in Treatment: 1 Wound Status Wound Number: 2 Primary Calciphylaxis Etiology: Wound Location: Left Upper Leg - Medial Wound Status: Open Wounding Event: Gradually  Appeared Comorbid Anemia, Asthma, Hypertension, End Stage Date Acquired: 04/23/2017 History: Renal Disease Weeks Of Treatment: 1 Clustered Wound: No Photos Photo Uploaded By: Curtis Sites on 12/10/2017 09:02:06 Wound Measurements Length: (cm) 1.5 Width: (cm) 4 Depth: (cm) 0.1 Area: (cm) 4.712 Volume: (cm) 0.471 % Reduction in Area: 28.6% % Reduction in Volume: 28.6% Epithelialization: None Tunneling: No Undermining: No Wound Description Full Thickness Without Exposed Support Classification: Structures Wound Margin: Flat and Intact Exudate Large Amount: Exudate Type: Serous Exudate Color: amber Foul Odor After Cleansing: No Slough/Fibrino Yes Wound Bed Granulation Amount: Large (67-100%) Exposed Structure Granulation Quality: Hyper-granulation Fascia Exposed: No Necrotic Amount: Small (1-33%) Fat Layer (Subcutaneous Tissue) Exposed: Yes Necrotic Quality: Adherent Slough Tendon Exposed: No Muscle Exposed: No Joint Exposed: No Bone Exposed: No Kirchman, Jahmeir ([id number]) Periwound Skin Texture Texture Color No Abnormalities Noted: No No Abnormalities Noted: No Callus: No Atrophie Blanche: No Crepitus: No Cyanosis: No Excoriation: No Ecchymosis: No Induration: No Erythema: No Rash: No Hemosiderin Staining: No Scarring: No Mottled: No Pallor: No Moisture Rubor: No No Abnormalities Noted: No Dry / Scaly: No Temperature / Pain Maceration: No Temperature: No Abnormality Wound Preparation Ulcer Cleansing: Rinsed/Irrigated with Saline Topical Anesthetic Applied: Other: lidocaine 4%, Electronic Signature(s) Signed: 12/10/2017 4:56:01 PM By: Curtis Sites Entered By: Curtis Sites on 12/10/2017 08:18:08 [name] ([id number]) -------------------------------------------------------------------------------- Vitals Details Patient Name: [name] Date of Service: 12/10/2017 8:30 AM Medical Record Number: [id number] Patient Account Number:  [id number] Date of Birth/Sex: 11/22/1980 (37 y.o. M) Treating RN: Curtis Sites Primary Care Bryella Diviney: Abner Greenspan Other Clinician: Referring Erynn Vaca: Abner Greenspan Treating Quaniya Damas/Extender: STONE III, HOYT Weeks in Treatment: 1 Vital Signs Time Taken: 08:11 Temperature (F): 98.1 Height (in): 70 Pulse (bpm): 61 Weight (lbs): 245 Respiratory Rate (breaths/min): 16 Body Mass Index (BMI): 35.2 Blood Pressure (mmHg): 124/73 Reference Range: 80 - 120 mg / dl Electronic Signature(s) Signed: 12/10/2017 4:56:01 PM By: Curtis Sites Entered By: Curtis Sites on 12/10/2017 08:11:46

## 2017-12-17 ENCOUNTER — Ambulatory Visit: Payer: Medicaid Other | Admitting: Physician Assistant

## 2017-12-19 ENCOUNTER — Encounter: Payer: Medicaid Other | Attending: Physician Assistant | Admitting: Internal Medicine

## 2017-12-19 DIAGNOSIS — I132 Hypertensive heart and chronic kidney disease with heart failure and with stage 5 chronic kidney disease, or end stage renal disease: Secondary | ICD-10-CM | POA: Diagnosis not present

## 2017-12-19 DIAGNOSIS — L97112 Non-pressure chronic ulcer of right thigh with fat layer exposed: Secondary | ICD-10-CM | POA: Diagnosis present

## 2017-12-19 DIAGNOSIS — I5033 Acute on chronic diastolic (congestive) heart failure: Secondary | ICD-10-CM | POA: Diagnosis not present

## 2017-12-19 DIAGNOSIS — L97122 Non-pressure chronic ulcer of left thigh with fat layer exposed: Secondary | ICD-10-CM | POA: Diagnosis not present

## 2017-12-19 DIAGNOSIS — N186 End stage renal disease: Secondary | ICD-10-CM | POA: Insufficient documentation

## 2017-12-22 NOTE — Progress Notes (Signed)
Brian, [name] ([id number]) Visit Report for 12/19/2017 Arrival Information Details Patient Name: [name], [name] Date of Service: 12/19/2017 9:00 AM Medical Record Number: [id number] Patient Account Number: [id number] Date of Birth/Sex: Aug 19, 1980 (36 y.o. M) Treating RN: Phillis Haggis Primary Care Dyllon Henken: Abner Greenspan Other Clinician: Referring Vegas Coffin: Abner Greenspan Treating Nazair Fortenberry/Extender: Altamese [place] in Treatment: 2 Visit Information History Since Last Visit All ordered tests and consults were completed: No Patient Arrived: Ambulatory Added or deleted any medications: No Arrival Time: 09:13 Any new allergies or adverse reactions: No Accompanied By: self Had a fall or experienced change in No Transfer Assistance: None activities of daily living that may affect Patient Identification Verified: Yes risk of falls: Secondary Verification Process Yes Signs or symptoms of abuse/neglect since last visito No Completed: Hospitalized since last visit: No Patient Requires Transmission-Based No Implantable device outside of the clinic excluding No Precautions: cellular tissue based products placed in the center Patient Has Alerts: Yes since last visit: Patient Alerts: Patient on Blood Has Dressing in Place as Prescribed: Yes Thinner Pain Present Now: No Eliquis Electronic Signature(s) Signed: 12/19/2017 4:48:17 PM By: Alejandro Mulling Entered By: Alejandro Mulling on 12/19/2017 09:14:10 [place], [name] -------------------------------------------------------------------------------- Clinic Level of Care Assessment Details Patient Name: [name] Date of Service: 12/19/2017 9:00 AM Medical Record Number: [id number] Patient Account Number: [id number] Date of Birth/Sex: June 07, 1981 (37 y.o. M) Treating RN: Huel Coventry Primary Care Brenner Visconti: Abner Greenspan Other Clinician: Referring Talal Fritchman: Abner Greenspan Treating Nesha Counihan/Extender: Altamese [employer] in  Treatment: 2 Clinic Level of Care Assessment Items TOOL 4 Quantity Score  - Use when only an EandM is performed on FOLLOW-UP visit 0 ASSESSMENTS - Nursing Assessment / Reassessment X - Reassessment of Co-morbidities (includes updates in patient status) 1 10  - 0 Reassessment of Adherence to Treatment Plan ASSESSMENTS - Wound and Skin Assessment / Reassessment  - Simple Wound Assessment / Reassessment - one wound 0 X- 2 5 Complex Wound Assessment / Reassessment - multiple wounds  - 0 Dermatologic / Skin Assessment (not related to wound area) ASSESSMENTS - Focused Assessment  - Circumferential Edema Measurements - multi extremities 0  - 0 Nutritional Assessment / Counseling / Intervention  - 0 Lower Extremity Assessment (monofilament, tuning fork, pulses)  - 0 Peripheral Arterial Disease Assessment (using hand held doppler) ASSESSMENTS - Ostomy and/or Continence Assessment and Care  - Incontinence Assessment and Management 0  - 0 Ostomy Care Assessment and Management (repouching, etc.) PROCESS - Coordination of Care X - Simple Patient / Family Education for ongoing care 1 15  - 0 Complex (extensive) Patient / Family Education for ongoing care  - 0 Staff obtains Chiropractor, Records, Test Results / Process Orders  - 0 Staff telephones HHA, Nursing Homes / Clarify orders / etc  - 0 Routine Transfer to another Facility (non-emergent condition)  - 0 Routine Hospital Admission (non-emergent condition)  - 0 New Admissions / Manufacturing engineer / Ordering NPWT, Apligraf, etc.  - 0 Emergency Hospital Admission (emergent condition) X- 1 10 Simple Discharge Coordination Nicasio, Terek ([id number])  - 0 Complex (extensive) Discharge Coordination PROCESS - Special Needs  - Pediatric / Minor Patient Management 0  - 0 Isolation Patient Management  - 0 Hearing / Language / Visual special needs  - 0 Assessment of Community assistance  (transportation, D/C planning, etc.)  - 0 Additional assistance / Altered mentation  - 0 Support Surface(s) Assessment (bed, cushion, seat, etc.) INTERVENTIONS - Wound Cleansing / Measurement  - Simple Wound Cleansing -  one wound 0 X- 2 5 Complex Wound Cleansing - multiple wounds X- 1 5 Wound Imaging (photographs - any number of wounds)  - 0 Wound Tracing (instead of photographs)  - 0 Simple Wound Measurement - one wound X- 2 5 Complex Wound Measurement - multiple wounds INTERVENTIONS - Wound Dressings  - Small Wound Dressing one or multiple wounds 0 X- 2 15 Medium Wound Dressing one or multiple wounds  - 0 Large Wound Dressing one or multiple wounds  - 0 Application of Medications - topical  - 0 Application of Medications - injection INTERVENTIONS - Miscellaneous  - External ear exam 0  - 0 Specimen Collection (cultures, biopsies, blood, body fluids, etc.)  - 0 Specimen(s) / Culture(s) sent or taken to Lab for analysis  - 0 Patient Transfer (multiple staff / Nurse, adult / Similar devices)  - 0 Simple Staple / Suture removal (25 or less)  - 0 Complex Staple / Suture removal (26 or more)  - 0 Hypo / Hyperglycemic Management (close monitor of Blood Glucose)  - 0 Ankle / Brachial Index (ABI) - do not check if billed separately X- 1 5 Vital Signs Haegele, Issiac ([id number]) Has the patient been seen at the hospital within the last three years: Yes Total Score: 105 Level Of Care: New/Established - Level 3 Electronic Signature(s) Signed: 12/19/2017 5:47:31 PM By: Elliot Gurney, BSN, RN, CWS, Kim RN, BSN Entered By: Elliot Gurney, BSN, RN, CWS, Kim on 12/19/2017 09:52:41 [name] ([id number]) -------------------------------------------------------------------------------- Encounter Discharge Information Details Patient Name: [name] Date of Service: 12/19/2017 9:00 AM Medical Record Number: [id number] Patient Account Number: [id number] Date of  Birth/Sex: [birth year] (36 y.o. M) Treating RN: Curtis Sites Primary Care Ash Mcelwain: Abner Greenspan Other Clinician: Referring Shelli Portilla: Abner Greenspan Treating Leanda Padmore/Extender: Altamese [place] in Treatment: 2 Encounter Discharge Information Items Discharge Condition: Stable Ambulatory Status: Ambulatory Discharge Destination: Skilled Nursing Facility Telephoned: No Orders Sent: Yes Transportation: Private Auto Accompanied By: self Schedule Follow-up Appointment: Yes Clinical Summary of Care: Electronic Signature(s) Signed: 12/20/2017 4:34:09 PM By: Curtis Sites Entered By: Curtis Sites on 12/19/2017 10:01:10 [name] ([id number]) -------------------------------------------------------------------------------- Lower Extremity Assessment Details Patient Name: [name] Date of Service: 12/19/2017 9:00 AM Medical Record Number: [id number] Patient Account Number: [id number] Date of Birth/Sex: 01/26/1981 (37 y.o. M) Treating RN: Phillis Haggis Primary Care Demarius Archila: Abner Greenspan Other Clinician: Referring Polly Barner: Abner Greenspan Treating Kellar Westberg/Extender: Maxwell Caul Weeks in Treatment: 2 Electronic Signature(s) Signed: 12/19/2017 4:48:17 PM By: Alejandro Mulling Entered By: Alejandro Mulling on 12/19/2017 09:23:17 [place], Jomarie Longs ([id number]) -------------------------------------------------------------------------------- Multi Wound Chart Details Patient Name: [name] Date of Service: 12/19/2017 9:00 AM Medical Record Number: [id number] Patient Account Number: [id number] Date of Birth/Sex: [birth year] (37 y.o. 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] [N/A:N/A] Comorbid History: [1:Anemia, Asthma, Hypertension, End Stage Renal Disease] [2:Anemia, Asthma, Hypertension, End Stage Renal Disease] [N/A:N/A] Date Acquired: [1:04/23/2017] [2:04/23/2017] [N/A:N/A] Weeks of Treatment: [1:2] [2:2] [N/A:N/A] Wound Status: [1:Open] [2:Open] [N/A:N/A] Measurements L x W x D [1:2.6x5.4x0.1] [2:0.7x2.9x0.1] [N/A:N/A] (cm) Area (cm) : [1:11.027] [2:1.594] [N/A:N/A] Volume (cm) : [1:1.103] [2:0.159] [N/A:N/A] % Reduction in Area: [  1:45.00%] [2:75.80%] [N/A:N/A] % Reduction in Volume: [1:45.00%] [2:75.90%] [N/A:N/A] Classification: [1:Full Thickness Without Exposed Support Structures] [2:Full Thickness Without Exposed Support Structures] [N/A:N/A] Exudate Amount: [1:Large] [2:Large] [N/A:N/A] Exudate Type: [1:Serosanguineous] [2:Serosanguineous] [N/A:N/A] Exudate Color: [1:red, brown] [2:red, brown] [N/A:N/A] Wound Margin: [1:Flat and Intact] [2:Flat and Intact] [N/A:N/A] Granulation Amount: [1:Large (67-100%)] [2:Large (67-100%)] [N/A:N/A] Necrotic Amount: [1:Small (1-33%)] [2:Small (1-33%)] [N/A:N/A] Necrotic Tissue: [1:Eschar, Adherent Slough] [2:Eschar, Adherent Slough] [N/A:N/A] Exposed Structures: [1:Fat Layer (Subcutaneous Tissue) Exposed: Yes Fascia: No Tendon: No Muscle: No Joint: No Bone: No] [2:Fat Layer (Subcutaneous Tissue) Exposed: Yes Fascia: No Tendon: No Muscle: No Joint: No Bone: No] [N/A:N/A] Epithelialization: [1:None] [2:None] [N/A:N/A] Periwound Skin Texture: [1:Excoriation: No Induration: No Callus: No Crepitus: No] [2:Excoriation: No Induration: No Callus: No Crepitus: No] [N/A:N/A] Rash: No Rash: No Scarring: No Scarring: No Periwound Skin  Moisture: Maceration: No Maceration: No N/A Dry/Scaly: No Dry/Scaly: No Periwound Skin Color: Atrophie Blanche: No Atrophie Blanche: No N/A Cyanosis: No Cyanosis: No Ecchymosis: No Ecchymosis: No Erythema: No Erythema: No Hemosiderin Staining: No Hemosiderin Staining: No Mottled: No Mottled: No Pallor: No Pallor: No Rubor: No Rubor: No Temperature: No Abnormality No Abnormality N/A Tenderness on Palpation: Yes No N/A Wound Preparation: Ulcer Cleansing: Ulcer Cleansing: N/A Rinsed/Irrigated with Saline Rinsed/Irrigated with Saline Topical Anesthetic Applied: Topical Anesthetic Applied: Other: lidocaine 4% Other: lidocaine 4% Treatment Notes Wound #1 (Right, Anterior Upper Leg) 1. Cleansed with: Clean wound with Normal Saline 2. Anesthetic Topical Lidocaine 4% cream to wound bed prior to debridement 3. Peri-wound Care: Skin Prep 4. Dressing Applied: Hydrafera Blue 5. Secondary Dressing Applied Bordered Foam Dressing Wound #2 (Left, Medial Upper Leg) 1. Cleansed with: Clean wound with Normal Saline 2. Anesthetic Topical Lidocaine 4% cream to wound bed prior to debridement 3. Peri-wound Care: Skin Prep 4. Dressing Applied: Hydrafera Blue 5. Secondary Dressing Applied Bordered Foam Dressing Electronic Signature(s) Signed: 12/20/2017 1:16:01 PM By: Baltazar Najjar MD Entered By: Baltazar Najjar on 12/19/2017 10:34:42 [place], Jomarie Longs ([id number]) -------------------------------------------------------------------------------- Multi-Disciplinary Care Plan Details Patient Name: [name] Date of Service: 12/19/2017 9:00 AM Medical Record Number: [id number] Patient Account Number: [id number] Date of Birth/Sex: 14-Nov-1980 (36 y.o. M) Treating RN: Huel Coventry Primary Care Izaac Reisig: Abner Greenspan Other Clinician: Referring Karsyn Rochin: Abner Greenspan Treating Treyshon Buchanon/Extender: Altamese [place] in Treatment: 2 Active Inactive ` Orientation to the Wound Care  Program Nursing Diagnoses: Knowledge deficit related to the wound healing center program Goals: Patient/caregiver will verbalize understanding of the Wound Healing Center Program Date Initiated: 12/03/2017 Target Resolution Date: 12/24/2017 Goal Status: Active Interventions: Provide education on orientation to the wound center Notes: ` Wound/Skin Impairment Nursing Diagnoses: Impaired tissue integrity Goals: Patient/caregiver will verbalize understanding of skin care regimen Date Initiated: 12/03/2017 Target Resolution Date: 12/24/2017 Goal Status: Active Ulcer/skin breakdown will have a volume reduction of 30% by week 4 Date Initiated: 12/03/2017 Target Resolution Date: 12/24/2017 Goal Status: Active Interventions: Assess patient/caregiver ability to obtain necessary supplies Assess patient/caregiver ability to perform ulcer/skin care regimen upon admission and as needed Assess ulceration(s) every visit Treatment Activities: Skin care regimen initiated : 12/03/2017 Notes: Electronic Signature(s) Signed: 12/19/2017 5:47:31 PM By: Elliot Gurney, BSN, RN, CWS, Kim RN, BSN [place], [place] ([id number]) Entered By: Elliot Gurney, BSN, RN, CWS, Kim on 12/19/2017 09:51:05 [place], Jomarie Longs ([id number]) -------------------------------------------------------------------------------- Pain Assessment Details Patient Name: [name] Date of Service: 12/19/2017 9:00 AM Medical Record Number: [id number] Patient Account Number: [id number] Date of Birth/Sex: February 06, 1981 (36 y.o. M) Treating RN: Phillis Haggis Primary Care Ezio Wieck: Abner Greenspan Other Clinician: Referring Howell Groesbeck: Yetta Flock,  BETH Treating Ridley Dileo/Extender: Maxwell Caul Weeks in Treatment: 2 Active Problems Location of Pain Severity and Description of Pain Patient Has Paino No Site Locations Pain Management and Medication Current Pain Management: Electronic Signature(s) Signed: 12/19/2017 4:48:17 PM By: Alejandro Mulling Entered By: Alejandro Mulling on 12/19/2017 09:15:02 [place], Jomarie Longs ([id number]) -------------------------------------------------------------------------------- Patient/Caregiver Education Details Patient Name: [name] Date of Service: 12/19/2017 9:00 AM Medical Record Number: [id number] Patient Account Number: [id number] Date of Birth/Gender: October 05, 1980 (37 y.o. M) Treating RN: Curtis Sites Primary Care Physician: Abner Greenspan Other Clinician: Referring Physician: Abner Greenspan Treating Physician/Extender: Altamese [place] in Treatment: 2 Education Assessment Education Provided To: Caregiver SNF nurses via written orders Education Topics Provided Wound/Skin Impairment: Handouts: Other: wound carfe orders Methods: Printed Electronic Signature(s) Signed: 12/20/2017 4:34:09 PM By: Curtis Sites Entered By: Curtis Sites on 12/19/2017 10:01:33 [place], [name] -------------------------------------------------------------------------------- Wound Assessment Details Patient Name: [name] Date of Service: 12/19/2017 9:00 AM Medical Record Number: [id number] Patient Account Number: [id number] Date of Birth/Sex: 1980/12/09 (37 y.o. M) Treating RN: Phillis Haggis Primary Care Julane Crock: Abner Greenspan Other Clinician: Referring Betsi Crespi: Abner Greenspan Treating Shamela Haydon/Extender: Maxwell Caul Weeks in Treatment: 2 Wound Status Wound Number: 1 Primary Calciphylaxis Etiology: Wound Location: Right Upper Leg - Anterior Wound Status: Open Wounding Event: Gradually Appeared Comorbid Anemia, Asthma, Hypertension, End Stage Date Acquired: 04/23/2017 History: Renal Disease Weeks Of Treatment: 2 Clustered Wound: No Photos Photo Uploaded By: Elliot Gurney, BSN, RN, CWS, Kim on 12/20/2017 15:53:07 Wound Measurements Length: (cm) 2.6 Width: (cm) 5.4 Depth: (cm) 0.1 Area: (cm) 11.027 Volume: (cm) 1.103 % Reduction in Area: 45% % Reduction in Volume: 45% Epithelialization: None Tunneling:  No Undermining: No Wound Description Full Thickness Without Exposed Support Classification: Structures Wound Margin: Flat and Intact Exudate Large Amount: Exudate Type: Serosanguineous Exudate Color: red, brown Foul Odor After Cleansing: No Slough/Fibrino Yes Wound Bed Granulation Amount: Large (67-100%) Exposed Structure Granulation Quality: Hyper-granulation Fascia Exposed: No Necrotic Amount: Small (1-33%) Fat Layer (Subcutaneous Tissue) Exposed: Yes Necrotic Quality: Eschar, Adherent Slough Tendon Exposed: No Muscle Exposed: No Joint Exposed: No Bone Exposed: No Mcsorley, Andras ([id number]) Periwound Skin Texture Texture Color No Abnormalities Noted: No No Abnormalities Noted: No Callus: No Atrophie Blanche: No Crepitus: No Cyanosis: No Excoriation: No Ecchymosis: No Induration: No Erythema: No Rash: No Hemosiderin Staining: No Scarring: No Mottled: No Pallor: No Moisture Rubor: No No Abnormalities Noted: No Dry / Scaly: No Temperature / Pain Maceration: No Temperature: No Abnormality Tenderness on Palpation: Yes Wound Preparation Ulcer Cleansing: Rinsed/Irrigated with Saline Topical Anesthetic Applied: Other: lidocaine 4%, Treatment Notes Wound #1 (Right, Anterior Upper Leg) 1. Cleansed with: Clean wound with Normal Saline 2. Anesthetic Topical Lidocaine 4% cream to wound bed prior to debridement 3. Peri-wound Care: Skin Prep 4. Dressing Applied: Hydrafera Blue 5. Secondary Dressing Applied Bordered Foam Dressing Electronic Signature(s) Signed: 12/19/2017 4:48:17 PM By: Alejandro Mulling Entered By: Alejandro Mulling on 12/19/2017 09:20:43 [place], Jomarie Longs ([id number]) -------------------------------------------------------------------------------- Wound Assessment Details Patient Name: [name] Date of Service: 12/19/2017 9:00 AM Medical Record Number: [id number] Patient Account Number: [id number] Date of Birth/Sex: 10-22-1980 (37 y.o. M) Treating  RN: Phillis Haggis Primary Care Alaster Asfaw: Abner Greenspan Other Clinician: Referring Aneka Fagerstrom: Abner Greenspan Treating Mellissa Conley/Extender: Maxwell Caul Weeks in Treatment: 2 Wound Status Wound Number: 2 Primary Calciphylaxis Etiology: Wound Location: Left Upper Leg - Medial Wound Status: Open Wounding Event: Gradually Appeared Comorbid Anemia, Asthma, Hypertension, End Stage Date Acquired: 04/23/2017 History: Renal Disease Weeks Of Treatment: 2 Clustered Wound: No Photos  Photo Uploaded By: Elliot Gurney, BSN, RN, CWS, Kim on 12/20/2017 15:43:33 Wound Measurements Length: (cm) 0.7 Width: (cm) 2.9 Depth: (cm) 0.1 Area: (cm) 1.594 Volume: (cm) 0.159 % Reduction in Area: 75.8% % Reduction in Volume: 75.9% Epithelialization: None Tunneling: No Undermining: No Wound Description Full Thickness Without Exposed Support Classification: Structures Wound Margin: Flat and Intact Exudate Large Amount: Exudate Type: Serosanguineous Exudate Color: red, brown Foul Odor After Cleansing: No Slough/Fibrino Yes Wound Bed Granulation Amount: Large (67-100%) Exposed Structure Granulation Quality: Hyper-granulation Fascia Exposed: No Necrotic Amount: Small (1-33%) Fat Layer (Subcutaneous Tissue) Exposed: Yes Necrotic Quality: Eschar, Adherent Slough Tendon Exposed: No Muscle Exposed: No Joint Exposed: No Bone Exposed: No Lemme, Jozsef ([id number]) Periwound Skin Texture Texture Color No Abnormalities Noted: No No Abnormalities Noted: No Callus: No Atrophie Blanche: No Crepitus: No Cyanosis: No Excoriation: No Ecchymosis: No Induration: No Erythema: No Rash: No Hemosiderin Staining: No Scarring: No Mottled: No Pallor: No Moisture Rubor: No No Abnormalities Noted: No Dry / Scaly: No Temperature / Pain Maceration: No Temperature: No Abnormality Wound Preparation Ulcer Cleansing: Rinsed/Irrigated with Saline Topical Anesthetic Applied: Other: lidocaine 4%, Treatment  Notes Wound #2 (Left, Medial Upper Leg) 1. Cleansed with: Clean wound with Normal Saline 2. Anesthetic Topical Lidocaine 4% cream to wound bed prior to debridement 3. Peri-wound Care: Skin Prep 4. Dressing Applied: Hydrafera Blue 5. Secondary Dressing Applied Bordered Foam Dressing Electronic Signature(s) Signed: 12/19/2017 4:48:17 PM By: Alejandro Mulling Entered By: Alejandro Mulling on 12/19/2017 09:22:15 [place], Jomarie Longs ([id number]) -------------------------------------------------------------------------------- Vitals Details Patient Name: [name] Date of Service: 12/19/2017 9:00 AM Medical Record Number: [id number] Patient Account Number: [id number] Date of Birth/Sex: [birth year] (37 y.o. M) Treating RN: Phillis Haggis Primary Care Harlee Pursifull: Abner Greenspan Other Clinician: Referring Ender Rorke: Abner Greenspan Treating Deaaron Fulghum/Extender: Maxwell Caul Weeks in Treatment: 2 Vital Signs Time Taken: 09:15 Temperature (F): 98.1 Height (in): 70 Pulse (bpm): 62 Weight (lbs): 245 Respiratory Rate (breaths/min): 18 Body Mass Index (BMI): 35.2 Blood Pressure (mmHg): 118/67 Reference Range: 80 - 120 mg / dl Electronic Signature(s) Signed: 12/19/2017 4:48:17 PM By: Alejandro Mulling Entered By: Alejandro Mulling on 12/19/2017 09:16:11

## 2017-12-22 NOTE — Progress Notes (Signed)
[name] ([id number]) Visit Report for 12/19/2017 HPI Details Patient Name: [name], [name] Date of Service: 12/19/2017 9:00 AM Medical Record Number: [id number] Patient Account Number: [id number] Date of Birth/Sex: 12-04-1980 (37 y.o. 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 He has currently been undergoing treatment according to notes reviewed from 11/28/17 for this. 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 Subsequently patient was using Santyl for very long time but is no longer using that at this point. He is receiving sodium thiosulfate during hemodialysis along with Sevelamer to help control his phosphorus levels. Currently a steroid cream was recommended for the wounds although he has not used that yet. He a has what appears to be extremely hyper granular and poorly attached granulation noted over the bed of the wound. This is true of both wounds of lower extremities. He does show some signs of epithelialization around the border although this has been very slow according to the patient. Fortunately he is not having significant pain at this point. He does have a history of hypertension as well is congestive heart failure. Otherwise he states the wounds are much better than when I first started. 12/10/17 on evaluation today patient actually presents with signs of improvement in regard to his bilateral thigh ulcer. He has been tolerating the dressing changes without complication which is good news. With that being said is not really having a significant amount of bleeding at this point  today is excellent. He has been pleased with the progress as well. There's no evidence of infection. 12/19/17; patient normally followed by [name] on Mondays. In his absence he comes in to see me today. 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 Initially painful necrotic wounds that have been debrided to a healthy surface. He is using Hydrofera Blue. He has one on the right anterior thigh and one on the left Electronic Signature(s) Signed: 12/20/2017 1:16:01 PM By: Brian Najjar MD Entered By: [name] on 12/19/2017 10:44:48 Mcelhinney, [name] ([id number]) -------------------------------------------------------------------------------- Physical Exam Details Patient Name: [name] Date of Service: 12/19/2017 9:00 AM Medical Record Number: [id number] Patient Account Number: [id number] Date of Birth/Sex: [birth year] (37 y.o. M) Treating RN: [name] Primary Care Provider: Abner [name] Other Clinician: Referring Provider: Abner [name] Treating Provider/Extender: [name] Weeks in Treatment: 2 Constitutional Sitting or standing Blood Pressure is within target range for patient.. Pulse regular and within target range for patient.[name] Respirations regular, non-labored and within target range.. Temperature is normal and within the target range for the patient.[name] appears in no distress. Eyes Conjunctivae clear. No discharge. Respiratory Respiratory effort is easy and symmetric bilaterally. Rate is normal at rest and on room air.. Cardiovascular patient has good edema control. Integumentary (Hair, Skin) looking at both legs there are healed areas especially on the left lower medial thigh.. Psychiatric No evidence of depression, anxiety, or agitation. Calm, cooperative, and communicative. Appropriate interactions and affect.. Notes 12/19/17; the patient has healthy looking granulation with advancing epithelialization. His dimensions are better. No  debridement is required. Wound surface looks healthy Electronic Signature(s) Signed: 12/20/2017 1:16:01 PM By: Brian Najjar MD Entered By: [name] on 12/19/2017 10:46:19 [name] ([id number]) --------------------------------------------------------------------------------  Physician Orders Details Patient Name: [name] Date of Service: 12/19/2017 9:00 AM Medical Record Number: [id number] Patient Account Number: [id number] Date of Birth/Sex: [birth year] (37 y.o. M) Treating RN: [name] Primary Care Provider: Abner [name] Other Clinician: Referring Provider: Abner [name] Treating Provider/Extender: Brian [place] in Treatment: 2 Verbal / Phone Orders: No Diagnosis Coding Wound Cleansing Wound #1 Right,Anterior Upper Leg o Clean wound with Normal Saline. o Clean wound with Normal Saline. Wound #2 Left,Medial Upper Leg o Clean wound with Normal Saline. o Clean wound with Normal Saline. Anesthetic (add to Medication List) Wound #1 Right,Anterior Upper Leg o Topical Lidocaine 4% cream applied to wound bed prior to debridement (In Clinic Only). o Topical Lidocaine 4% cream applied to wound bed prior to debridement (In Clinic Only). Wound #2 Left,Medial Upper Leg o Topical Lidocaine 4% cream applied to wound bed prior to debridement (In Clinic Only). o Topical Lidocaine 4% cream applied to wound bed prior to debridement (In Clinic Only). Skin Barriers/Peri-Wound Care Wound #1 Right,Anterior Upper Leg o Skin Prep Wound #2 Left,Medial Upper Leg o Skin Prep Primary Wound Dressing Wound #1 Right,Anterior Upper Leg o Hydrafera Blue Ready Transfer Wound #2 Left,Medial Upper Leg o Hydrafera Blue Ready Transfer Secondary Dressing Wound #1 Right,Anterior Upper Leg o Boardered Foam Dressing Wound #2 Left,Medial Upper Leg o Boardered Foam Dressing Dressing Change Frequency Wound #1 Right,Anterior Upper Leg o Change dressing every  other day. [name] ([id number]) Wound #2 Left,Medial Upper Leg o Change dressing every other day. Follow-up Appointments Wound #1 Right,Anterior Upper Leg o Return Appointment in 1 week. Wound #2 Left,Medial Upper Leg o Return Appointment in 1 week. Electronic Signature(s) Signed: 12/19/2017 5:47:31 PM By: Elliot Gurney, BSN, RN, CWS, Kim RN, BSN Signed: 12/20/2017 1:16:01 PM By: Brian Najjar MD Entered By: Elliot Gurney, BSN, RN, CWS, Kim on 12/19/2017 09:52:03 KYSHAUN, BARNETTE ([id number]) -------------------------------------------------------------------------------- Problem List Details Patient Name: [name] Date of Service: 12/19/2017 9:00 AM Medical Record Number: [id number] Patient Account Number: [id number] Date of Birth/Sex: 01-Feb-1981 (36 y.o. 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 12/03/2017 Yes L97.112 Non-pressure chronic ulcer of right thigh with fat layer 12/03/2017 Yes exposed I10 Essential (primary) hypertension 12/03/2017 Yes I50.33 Acute on chronic diastolic (congestive) heart failure 12/03/2017 Yes Inactive Problems Resolved Problems Electronic Signature(s) Signed: 12/20/2017 1:16:01 PM By: Brian Najjar MD Entered By: [name] on 12/19/2017 10:34:27 Zalar, [name] ([id number]) -------------------------------------------------------------------------------- Progress Note Details Patient Name: [name] Date of Service: 12/19/2017 9:00 AM Medical Record Number: [id number] Patient Account Number: [id number] Date of Birth/Sex: November 20, 1980 (36 y.o. M) Treating RN: [name] Primary Care Provider:  Abner [name] Other Clinician: Referring Provider: Abner [name] Treating Provider/Extender: Brian [place] in Treatment: 2 Subjective History of Present Illness (HPI) 12/03/17 on evaluation today patient presents for initial evaluation and the wound center concerning issues he has been having with his bilateral lower extremities with Calciphylaxis. He has currently been undergoing treatment according to notes reviewed from 11/28/17 for this. 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 Subsequently patient was using Santyl for very long time but is no longer using that at this point. He is receiving sodium thiosulfate during hemodialysis along with Sevelamer  to help control his phosphorus levels. Currently a steroid cream was recommended for the wounds although he has not used that yet. He a has what appears to be extremely hyper granular and poorly attached granulation noted over the bed of the wound. This is true of both wounds of lower extremities. He does show some signs of epithelialization around the border although this has been very slow according to the patient. Fortunately he is not having significant pain at this point. He does have a history of hypertension as well is congestive heart failure. Otherwise he states the wounds are much better than when I first started. 12/10/17 on evaluation today patient actually presents with signs of improvement in regard to his bilateral thigh ulcer. He has been tolerating the dressing changes without complication which is good news. With that being said is not really having a significant amount of bleeding at this point today is excellent. He has been pleased with the progress as well. There's no evidence of infection. 12/19/17; patient normally followed by [name] on Mondays. In his absence he comes in to see me today. 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  Initially painful necrotic wounds that have been debrided to a healthy surface. He is using Hydrofera Blue. He has one on the right anterior thigh and one on the left Objective Constitutional Sitting or standing Blood Pressure is within target range for patient.. Pulse regular and within target range for patient.[name] Respirations regular, non-labored and within target range.. Temperature is normal and within the target range for the patient.[name] appears in no distress. Vitals Time Taken: 9:15 AM, Height: 70 in, Weight: 245 lbs, BMI: 35.2, Temperature: 98.1 F, Pulse: 62 bpm, Respiratory Rate: 18 breaths/min, Blood Pressure: 118/67 mmHg. Eyes Conjunctivae clear. No discharge. Respiratory Respiratory effort is easy and symmetric bilaterally. Rate is normal at rest and on room air.. Cardiovascular patient has good edema control. Sharon, Henriette ([id number]) Psychiatric No evidence of depression, anxiety, or agitation. Calm, cooperative, and communicative. Appropriate interactions and affect.. General Notes: 12/19/17; the patient has healthy looking granulation with advancing epithelialization. His dimensions are better. No debridement is required. Wound surface looks healthy Integumentary (Hair, Skin) looking at both legs there are healed areas especially on the left lower medial thigh.. Wound #1 status is Open. Original cause of wound was Gradually Appeared. The wound is located on the Right,Anterior Upper Leg. The wound measures 2.6cm length x 5.4cm width x 0.1cm depth; 11.027cm^2 area and 1.103cm^3 volume. There is Fat Layer (Subcutaneous Tissue) Exposed exposed. There is no tunneling or undermining noted. There is a large amount of serosanguineous drainage noted. The wound margin is flat and intact. There is large (67-100%) hyper - granulation within the wound bed. There is a small (1-33%) amount of necrotic tissue within the wound bed including Eschar and Adherent Slough. The periwound skin  appearance did not exhibit: Callus, Crepitus, Excoriation, Induration, Rash, Scarring, Dry/Scaly, Maceration, Atrophie Blanche, Cyanosis, Ecchymosis, Hemosiderin Staining, Mottled, Pallor, Rubor, Erythema. Periwound temperature was noted as No Abnormality. The periwound has tenderness on palpation. Wound #2 status is Open. Original cause of wound was Gradually Appeared. The wound is located on the Left,Medial Upper Leg. The wound measures 0.7cm length x 2.9cm width x 0.1cm depth; 1.594cm^2 area and 0.159cm^3 volume. There is Fat Layer (Subcutaneous Tissue) Exposed exposed. There is no tunneling or undermining noted. There is a large amount of serosanguineous drainage noted. The wound margin is flat and intact. There is large (67-100%)  hyper - granulation within the wound bed. There is a small (1-33%) amount of necrotic tissue within the wound bed including Eschar and Adherent Slough. The periwound skin appearance did not exhibit: Callus, Crepitus, Excoriation, Induration, Rash, Scarring, Dry/Scaly, Maceration, Atrophie Blanche, Cyanosis, Ecchymosis, Hemosiderin Staining, Mottled, Pallor, Rubor, Erythema. Periwound temperature was noted as No Abnormality. Assessment Active Problems ICD-10 E83.59 - Other disorders of calcium metabolism L97.122 - Non-pressure chronic ulcer of left thigh with fat layer exposed L97.112 - Non-pressure chronic ulcer of right thigh with fat layer exposed I10 - Essential (primary) hypertension I50.33 - Acute on chronic diastolic (congestive) heart failure Plan Wound Cleansing: Wound #1 Right,Anterior Upper Leg: Clean wound with Normal Saline. Clean wound with Normal Saline. Wound #2 Left,Medial Upper Leg: Clean wound with Normal Saline. Clean wound with Normal Saline. Anesthetic (add to Medication List): KOEHN, SALEHI ([id number]) Wound #1 Right,Anterior Upper Leg: Topical Lidocaine 4% cream applied to wound bed prior to debridement (In Clinic Only). Topical  Lidocaine 4% cream applied to wound bed prior to debridement (In Clinic Only). Wound #2 Left,Medial Upper Leg: Topical Lidocaine 4% cream applied to wound bed prior to debridement (In Clinic Only). Topical Lidocaine 4% cream applied to wound bed prior to debridement (In Clinic Only). Skin Barriers/Peri-Wound Care: Wound #1 Right,Anterior Upper Leg: Skin Prep Wound #2 Left,Medial Upper Leg: Skin Prep Primary Wound Dressing: Wound #1 Right,Anterior Upper Leg: Hydrafera Blue Ready Transfer Wound #2 Left,Medial Upper Leg: Hydrafera Blue Ready Transfer Secondary Dressing: Wound #1 Right,Anterior Upper Leg: Boardered Foam Dressing Wound #2 Left,Medial Upper Leg: Boardered Foam Dressing Dressing Change Frequency: Wound #1 Right,Anterior Upper Leg: Change dressing every other day. Wound #2 Left,Medial Upper Leg: Change dressing every other day. Follow-up Appointments: Wound #1 Right,Anterior Upper Leg: Return Appointment in 1 week. Wound #2 Left,Medial Upper Leg: Return Appointment in 1 week. #1 and we should continue with the [facility]. #2 the patient is on dialysis although he is not really sure what caused his chronic renal failure in the first place. I did not review these notes. 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 Apparently biopsies have been done to show this. I did not review this. Electronic Signature(s) Signed: 12/20/2017 1:16:01 PM By: Brian Najjar MD Entered By: [name] on 12/19/2017 10:47:27 Fontenot, Wilmot ([id number]) -------------------------------------------------------------------------------- SuperBill Details Patient Name: [name] Date of Service: 12/19/2017 Medical Record Number: [id number] Patient Account Number: [id number] Date of Birth/Sex: 03/31/1981 (37 y.o. 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 L97.112 Non-pressure chronic ulcer of right thigh with fat layer exposed I10 Essential (primary) hypertension I50.33 Acute on chronic diastolic (congestive) heart failure Facility Procedures CPT4 Code: 08657846 Description: 99213 - WOUND CARE VISIT-LEV 3 EST PT Modifier: Quantity: 1 Physician Procedures CPT4 Code: [id number] Description: 99213 - WC PHYS LEVEL 3 - EST PT ICD-10 Diagnosis Description L97.122 Non-pressure chronic ulcer of left thigh with fat layer ex L97.112 Non-pressure chronic ulcer of right thigh with fat layer e 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 Modifier: posed xposed Quantity: 1 Electronic Signature(s) Signed: 12/20/2017 1:16:01 PM By: Brian Najjar MD Entered By: [name] on 12/19/2017 10:47:54

## 2017-12-24 ENCOUNTER — Encounter: Payer: Medicaid Other | Admitting: Physician Assistant

## 2017-12-24 DIAGNOSIS — L97112 Non-pressure chronic ulcer of right thigh with fat layer exposed: Secondary | ICD-10-CM | POA: Diagnosis not present

## 2017-12-25 NOTE — Progress Notes (Signed)
TEANDRE, HAMRE ([id number]) Visit Report for 12/24/2017 Chief Complaint Document Details Patient Name: [name], [name] Date of Service: 12/24/2017 8:45 AM Medical Record Number: [id number] Patient Account Number: [id number] Date of Birth/Sex: 1980/11/27 (37 y.o. M) Treating RN: Renne Crigler Primary Care Provider: Abner Greenspan Other Clinician: Referring Provider: Abner Greenspan Treating Provider/Extender: Linwood Dibbles, HOYT Weeks in Treatment: 3 Information Obtained from: Patient Chief Complaint Bilateral thigh Calciphylaxis Ulcers Electronic Signature(s) Signed: 12/24/2017 4:12:37 PM By: Lenda Kelp PA-C Entered By: Lenda Kelp on 12/24/2017 08:49:36 [place], [place] ([id number]) -------------------------------------------------------------------------------- HPI Details Patient Name: [name] Date of Service: 12/24/2017 8:45 AM Medical Record Number: [id number] Patient Account Number: [id number] Date of Birth/Sex: Feb 03, 1981 (37 y.o. 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 He has currently been undergoing treatment according to notes reviewed from 11/28/17 for this. 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 Subsequently patient was using Santyl for very long time but is no longer using that at this point. He is receiving sodium thiosulfate during hemodialysis along with Sevelamer to help control his phosphorus levels. Currently a steroid cream was recommended for the wounds although he has not used that yet. He a has what appears to be extremely hyper granular and poorly  attached granulation noted over the bed of the wound. This is true of both wounds of lower extremities. He does show some signs of epithelialization around the border although this has been very slow according to the patient. Fortunately he is not having significant pain at this point. He does have a history of hypertension as well is congestive heart failure. Otherwise he states the wounds are much better than when I first started. 12/10/17 on evaluation today patient actually presents with signs of improvement in regard to his bilateral thigh ulcer. He has been tolerating the dressing changes without complication which is good news. With that being said is not really having a significant amount of bleeding at this point today is excellent. He has been pleased with the progress as well. There's no evidence of infection. 12/19/17; patient normally followed by Allen Derry on Mondays. In his absence he comes in to see me today. 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 Initially painful necrotic wounds that have been debrided to a healthy surface. He is using Hydrofera Blue. He has one on the right anterior thigh and one on the left 12/24/17 on evaluation today patient's wounds actually appear to be doing excellent in regard to the bilateral upper thighs. He has been tolerating the dressing changes without complication and overall he seems to be making great progress especially on the left. He has no pain. Electronic Signature(s) Signed: 12/24/2017 4:12:37 PM By: Lenda Kelp PA-C Entered By: Lenda Kelp on 12/24/2017 10:05:30 [place], [place] ([id number]) -------------------------------------------------------------------------------- Physical Exam Details Patient Name: [name] Date of Service: 12/24/2017 8:45 AM Medical Record Number: [id number] Patient Account Number: [id number] Date of Birth/Sex: 05/28/1981 (37 y.o. M) Treating RN: Renne Crigler Primary Care Provider: Abner Greenspan Other Clinician: Referring Provider: Abner Greenspan Treating Provider/Extender: STONE III, HOYT Weeks in Treatment: 3 Constitutional Obese and well-hydrated in no acute distress. Respiratory normal breathing without difficulty. clear to  auscultation bilaterally. Cardiovascular regular rate and rhythm with normal S1, S2. Psychiatric this patient is able to make decisions and demonstrates good insight into disease process. Alert and Oriented x 3. pleasant and cooperative. Notes Patient's wounds did not require any sharp debridement today since he seems to be tolerating the [facility] Dressing this is going to be appropriate to continue. Fortunately the patient seems to be having no issues otherwise. Electronic Signature(s) Signed: 12/24/2017 4:12:37 PM By: Lenda Kelp PA-C Entered By: Lenda Kelp on 12/24/2017 10:06:19 [place], [place]red ([id number]) -------------------------------------------------------------------------------- Physician Orders Details Patient Name: [name] Date of Service: 12/24/2017 8:45 AM Medical Record Number: [id number] Patient Account Number: [id number] Date of Birth/Sex: [birth year] (37 y.o. 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 o Clean wound with Normal Saline. Wound #2 Left,Medial Upper Leg o Clean wound with  Normal Saline. o Clean wound with Normal Saline. Anesthetic (add to Medication List) Wound #1 Right,Anterior Upper Leg o Topical Lidocaine 4% cream applied to wound bed prior to debridement (In Clinic Only). o Topical Lidocaine 4% cream applied to wound bed prior to debridement (In Clinic Only). Wound #2 Left,Medial Upper Leg o Topical Lidocaine 4% cream applied to wound bed prior to debridement (In Clinic Only). o Topical Lidocaine 4% cream applied to wound bed prior to debridement (In Clinic Only). Skin Barriers/Peri-Wound Care Wound #1 Right,Anterior Upper Leg o Skin Prep Wound #2 Left,Medial Upper Leg o Skin Prep Primary Wound Dressing Wound #1 Right,Anterior Upper Leg o Hydrafera Blue Ready Transfer Wound #2 Left,Medial Upper Leg o Hydrafera Blue Ready Transfer Secondary Dressing Wound #1 Right,Anterior Upper Leg [place], Xadrian ([id number]) o Boardered Foam Dressing Wound #2 Left,Medial Upper Leg o Boardered Foam Dressing Dressing Change Frequency Wound #1 Right,Anterior Upper Leg o Change dressing every other day. Wound #2 Left,Medial Upper Leg o Change dressing every other day. Follow-up Appointments Wound #1 Right,Anterior Upper Leg o Return Appointment in 1 week. Wound #2 Left,Medial Upper Leg o Return Appointment in 1 week. Electronic Signature(s) Signed: 12/24/2017 4:06:44 PM By: Renne Crigler Signed: 12/24/2017 4:12:37 PM By: Lenda Kelp PA-C Entered By: Renne Crigler on 12/24/2017 09:20:41 [place], [place] ([id number]) -------------------------------------------------------------------------------- Problem List Details Patient Name: [name] Date of Service: 12/24/2017 8:45 AM Medical Record Number: [id number] Patient Account Number: [id number] Date of Birth/Sex: [birth year] (36 y.o. 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 12/03/2017 Yes L97.112 Non-pressure chronic ulcer of right thigh with fat layer 12/03/2017 Yes exposed I10 Essential (primary) hypertension 12/03/2017 Yes I50.33 Acute on chronic diastolic (congestive) heart failure 12/03/2017 Yes Inactive Problems Resolved Problems Electronic Signature(s) Signed: 12/24/2017 4:12:37 PM By: Lenda Kelp PA-C Entered By: Lenda Kelp on 12/24/2017 08:49:28 [place], [place]ie ([id number]) -------------------------------------------------------------------------------- Progress Note Details Patient Name:  [name] Date of Service: 12/24/2017 8:45 AM Medical Record Number: [id number] Patient Account Number: [id number] Date of Birth/Sex: 1980-12-27 (37 y.o. M) Treating RN: Renne Crigler Primary Care Provider: Abner Greenspan Other Clinician: Referring Provider: Abner Greenspan Treating Provider/Extender: Linwood Dibbles, HOYT Weeks in Treatment: 3 Subjective Chief Complaint Information obtained from Patient Bilateral thigh Calciphylaxis Ulcers History of Present Illness (HPI) 12/03/17 on evaluation today patient presents for initial evaluation and the wound center concerning issues he has been having with his bilateral lower extremities with Calciphylaxis. He has currently been undergoing treatment according to notes reviewed from 11/28/17 for this. 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 Subsequently patient was using Santyl for very long time but is no longer using that at this point. He is receiving sodium thiosulfate during hemodialysis along with Sevelamer to help control his phosphorus levels. Currently a steroid cream was recommended  for the wounds although he has not used that yet. He a has what appears to be extremely hyper granular and poorly attached granulation noted over the bed of the wound. This is true of both wounds of lower extremities. He does show some signs of epithelialization around the border although this has been very slow according to the patient. Fortunately he is not having significant pain at this point. He does have a history of hypertension as well is congestive heart failure. Otherwise he states the wounds are much better than when I first started. 12/10/17 on evaluation today patient actually presents with signs of improvement in regard to his bilateral thigh ulcer. He has been tolerating the dressing changes without complication which is good news. With that being said is not really having a significant amount of bleeding at this point today is excellent. He has been pleased with the progress as well. There's no evidence of infection. 12/19/17; patient normally followed by Allen Derry on Mondays. In his absence he comes in to see me today. 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 Initially painful necrotic wounds that have been debrided to a healthy surface. He is using Hydrofera Blue. He has one on the right anterior thigh and one on the left 12/24/17 on evaluation today patient's wounds actually appear to be doing excellent in regard to the bilateral upper thighs. He has been tolerating the dressing changes without complication and overall he seems to be making great progress especially on the left. He has no pain. Patient History Information obtained from Patient. Family History Diabetes - Maternal Grandparents, Heart Disease - Maternal Grandparents, Hypertension - Maternal Grandparents, Stroke - Maternal Grandparents, No family history of Cancer, Hereditary Spherocytosis, Kidney Disease, Lung Disease, Seizures, Thyroid Problems, Tuberculosis. Social  History Never smoker, Marital Status - Single, Alcohol Use - Never, Drug Use - No History, Caffeine Use - Daily. Review of Systems (ROS) Constitutional Symptoms (General Health) Denies complaints or symptoms of Fever, Chills. Respiratory The patient has no complaints or symptoms. AZZAN, BUTLER ([id number]) Cardiovascular The patient has no complaints or symptoms. Psychiatric The patient has no complaints or symptoms. Objective Constitutional Obese and well-hydrated in no acute distress. Vitals Time Taken: 8:45 AM, Height: 70 in, Weight: 245 lbs, BMI: 35.2, Temperature: 98.0 F, Pulse: 66 bpm, Respiratory Rate: 16 breaths/min, Blood Pressure: 92/75 mmHg. Respiratory normal breathing without difficulty. clear to auscultation bilaterally. Cardiovascular regular rate and rhythm with normal S1, S2. Psychiatric this patient is able to make decisions and demonstrates good insight into disease process. Alert and Oriented x 3.  pleasant and cooperative. General Notes: Patient's wounds did not require any sharp debridement today since he seems to be tolerating the [facility] Dressing this is going to be appropriate to continue. Fortunately the patient seems to be having no issues otherwise. Integumentary (Hair, Skin) Wound #1 status is Open. Original cause of wound was Gradually Appeared. The wound is located on the Right,Anterior Upper Leg. The wound measures 2.1cm length x 4cm width x 0.1cm depth; 6.597cm^2 area and 0.66cm^3 volume. There is Fat Layer (Subcutaneous Tissue) Exposed exposed. There is no tunneling or undermining noted. There is a medium amount of serosanguineous drainage noted. The wound margin is flat and intact. There is large (67-100%) red granulation within the wound bed. There is a small (1-33%) amount of necrotic tissue within the wound bed including Adherent Slough. The periwound skin appearance exhibited: Hemosiderin Staining. The periwound skin appearance did not  exhibit: Callus, Crepitus, Excoriation, Induration, Rash, Scarring, Dry/Scaly, Maceration, Atrophie Blanche, Cyanosis, Ecchymosis, Mottled, Pallor, Rubor, Erythema. Periwound temperature was noted as No Abnormality. The periwound has tenderness on palpation. Wound #2 status is Open. Original cause of wound was Gradually Appeared. The wound is located on the Left,Medial Upper Leg. The wound measures 0.4cm length x 1cm width x 0.1cm depth; 0.314cm^2 area and 0.031cm^3 volume. There is Fat Layer (Subcutaneous Tissue) Exposed exposed. There is no tunneling or undermining noted. There is a large amount of serosanguineous drainage noted. The wound margin is flat and intact. There is large (67-100%) red granulation within the wound bed. There is no necrotic tissue within the wound bed. The periwound skin appearance did not exhibit: Callus, Crepitus, Excoriation, Induration, Rash, Scarring, Dry/Scaly, Maceration, Atrophie Blanche, Cyanosis, Ecchymosis, Hemosiderin Staining, Mottled, Pallor, Rubor, Erythema. Periwound temperature was noted as No Abnormality. [place], BURNETTE ([id number]) Assessment Active Problems ICD-10 E83.59 - Other disorders of calcium metabolism L97.122 - Non-pressure chronic ulcer of left thigh with fat layer exposed L97.112 - Non-pressure chronic ulcer of right thigh with fat layer exposed I10 - Essential (primary) hypertension I50.33 - Acute on chronic diastolic (congestive) heart failure Plan Wound Cleansing: Wound #1 Right,Anterior Upper Leg: Clean wound with Normal Saline. Clean wound with Normal Saline. Wound #2 Left,Medial Upper Leg: Clean wound with Normal Saline. Clean wound with Normal Saline. Anesthetic (add to Medication List): Wound #1 Right,Anterior Upper Leg: Topical Lidocaine 4% cream applied to wound bed prior to debridement (In Clinic Only). Topical Lidocaine 4% cream applied to wound bed prior to debridement (In Clinic Only). Wound #2 Left,Medial Upper  Leg: Topical Lidocaine 4% cream applied to wound bed prior to debridement (In Clinic Only). Topical Lidocaine 4% cream applied to wound bed prior to debridement (In Clinic Only). Skin Barriers/Peri-Wound Care: Wound #1 Right,Anterior Upper Leg: Skin Prep Wound #2 Left,Medial Upper Leg: Skin Prep Primary Wound Dressing: Wound #1 Right,Anterior Upper Leg: Hydrafera Blue Ready Transfer Wound #2 Left,Medial Upper Leg: Hydrafera Blue Ready Transfer Secondary Dressing: Wound #1 Right,Anterior Upper Leg: Boardered Foam Dressing Wound #2 Left,Medial Upper Leg: Boardered Foam Dressing Dressing Change Frequency: Wound #1 Right,Anterior Upper Leg: Change dressing every other day. Wound #2 Left,Medial Upper Leg: Change dressing every other day. Follow-up Appointments: Wound #1 Right,Anterior Upper Leg: Return Appointment in 1 week. Wound #2 Left,Medial Upper Leg: Return Appointment in 1 week. [place], [place] ([id number]) Brion Aliment suggest currently that we do continue with the [facility] Dressing since the granulation surface is much improved with this compared to where it was previous. He does not have the extensive hyper granulation that  was previously noted. We will see him for reevaluation and follow-up to see were things stand next week. Please see above for specific wound care orders. We will see patient for re-evaluation in 1 week(s) here in the clinic. If anything worsens or changes patient will contact our office for additional recommendations. Electronic Signature(s) Signed: 12/24/2017 4:12:37 PM By: Lenda Kelp PA-C Entered By: Lenda Kelp on 12/24/2017 10:06:37 [place], [place] ([id number]) -------------------------------------------------------------------------------- ROS/PFSH Details Patient Name: [name] Date of Service: 12/24/2017 8:45 AM Medical Record Number: [id number] Patient Account Number: [id number] Date of Birth/Sex: 1980/11/22 (37 y.o. M) Treating RN:  Renne Crigler Primary Care Provider: Abner Greenspan Other Clinician: Referring Provider: Abner Greenspan Treating Provider/Extender: STONE III, HOYT Weeks in Treatment: 3 Information Obtained From Patient Wound History Do you currently have one or more open woundso Yes How many open wounds do you currently haveo 2 Approximately how long have you had your woundso 6 months How have you been treating your wound(s) until nowo triamcinalone Has your wound(s) ever healed and then re-openedo No Have you had any lab work done in the past montho No Have you tested positive for an antibiotic resistant organism (MRSA, VRE)o No Have you tested positive for osteomyelitis (bone infection)o No Have you had any tests for circulation on your legso No Constitutional Symptoms (General Health) Complaints and Symptoms: Negative for: Fever; Chills Hematologic/Lymphatic Medical History: Positive for: Anemia Negative for: Hemophilia; Human Immunodeficiency Virus; Lymphedema; Sickle Cell Disease Respiratory Complaints and Symptoms: No Complaints or Symptoms Medical History: Positive for: Asthma - history Negative for: Aspiration; Chronic Obstructive Pulmonary Disease (COPD); Pneumothorax; Sleep Apnea; Tuberculosis Cardiovascular Complaints and Symptoms: No Complaints or Symptoms Medical History: Positive for: Hypertension Gastrointestinal Medical History: Negative for: Cirrhosis ; Colitis; Crohnos; Hepatitis A; Hepatitis B; Hepatitis C Genitourinary [place], [place] ([id number]) Medical History: Positive for: End Stage Renal Disease Immunological Medical History: Negative for: Lupus Erythematosus; Raynaudos; Scleroderma Musculoskeletal Medical History: Negative for: Gout; Rheumatoid Arthritis; Osteoarthritis; Osteomyelitis Neurologic Medical History: Negative for: Dementia; Neuropathy Oncologic Medical History: Negative for: Received Chemotherapy; Received Radiation Psychiatric Complaints and  Symptoms: No Complaints or Symptoms Immunizations Pneumococcal Vaccine: Received Pneumococcal Vaccination: Yes Implantable Devices Family and Social History Cancer: No; Diabetes: Yes - Maternal Grandparents; Heart Disease: Yes - Maternal Grandparents; Hereditary Spherocytosis: No; Hypertension: Yes - Maternal Grandparents; Kidney Disease: No; Lung Disease: No; Seizures: No; Stroke: Yes - Maternal Grandparents; Thyroid Problems: No; Tuberculosis: No; Never smoker; Marital Status - Single; Alcohol Use: Never; Drug Use: No History; Caffeine Use: Daily; Advanced Directives: No; Patient does not want information on Advanced Directives Physician Affirmation I have reviewed and agree with the above information. Electronic Signature(s) Signed: 12/24/2017 4:06:44 PM By: Renne Crigler Signed: 12/24/2017 4:12:37 PM By: Lenda Kelp PA-C Entered By: Lenda Kelp on 12/24/2017 10:05:51 [place], [place] ([id number]) -------------------------------------------------------------------------------- SuperBill Details Patient Name: [name] Date of Service: 12/24/2017 Medical Record Number: [id number] Patient Account Number: [id number] Date of Birth/Sex: 1981/06/16 (37 y.o. 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 L97.112 Non-pressure chronic ulcer of right thigh with fat layer exposed I10 Essential (primary) hypertension I50.33 Acute on chronic diastolic (congestive) heart failure Facility Procedures CPT4 Code: [id number] Description: 25366 - WOUND CARE VISIT-LEV 3 EST PT Modifier: Quantity: 1 Physician Procedures CPT4 Code: 4403474  Description: 99213 - WC PHYS LEVEL 3 - EST PT ICD-10  Diagnosis Description 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 L97.122 Non-pressure chronic ulcer of left thigh with fat layer ex L97.112 Non-pressure chronic ulcer of right thigh with fat layer e  I10 Essential (primary) hypertension Modifier: posed xposed Quantity: 1 Electronic Signature(s) Signed: 12/24/2017 4:12:37 PM By: Lenda Kelp PA-C Entered By: Lenda Kelp on 12/24/2017 10:07:27

## 2017-12-26 NOTE — Progress Notes (Signed)
Brian, [name] ([id number]) Visit Report for 12/24/2017 Arrival Information Details Patient Name: [name], [name] Date of Service: 12/24/2017 8:45 AM Medical Record Number: [id number] Patient Account Number: [id number] Date of Birth/Sex: [birth year] (37 y.o. M) Treating RN: Huel Coventry Primary Care Tesla Bochicchio: Abner Greenspan Other Clinician: Referring Arshan Jabs: Abner Greenspan Treating Ziad Maye/Extender: Linwood Dibbles, HOYT Weeks in Treatment: 3 Visit Information History Since Last Visit Added or deleted any medications: No Patient Arrived: Ambulatory Any new allergies or adverse reactions: No Arrival Time: 08:45 Had a fall or experienced change in No Accompanied By: self activities of daily living that may affect Transfer Assistance: None risk of falls: Patient Identification Verified: Yes Signs or symptoms of abuse/neglect since last visito No Secondary Verification Process Yes Hospitalized since last visit: No Completed: Implantable device outside of the clinic excluding No Patient Requires Transmission-Based No cellular tissue based products placed in the center Precautions: since last visit: Patient Has Alerts: Yes Has Dressing in Place as Prescribed: Yes Patient Alerts: Patient on Blood Pain Present Now: No Thinner Eliquis Electronic Signature(s) Signed: 12/25/2017 5:51:43 PM By: Elliot Gurney, BSN, RN, CWS, Kim RN, BSN Entered By: Elliot Gurney, BSN, RN, CWS, Kim on 12/24/2017 08:45:43 [name] ([id number]) -------------------------------------------------------------------------------- Clinic Level of Care Assessment Details Patient Name: [name] Date of Service: 12/24/2017 8:45 AM Medical Record Number: [id number] Patient Account Number: [id number] Date of Birth/Sex: February 02, 1981 (37 y.o. M) Treating RN: Renne Crigler Primary Care Jurnei Latini: Abner Greenspan Other Clinician: Referring Triston Lisanti: Abner Greenspan Treating Tye Juarez/Extender: Linwood Dibbles, HOYT Weeks in Treatment: 3 Clinic Level of Care  Assessment Items TOOL 4 Quantity Score X - Use when only an EandM is performed on FOLLOW-UP visit 1 0 ASSESSMENTS - Nursing Assessment / Reassessment X - Reassessment of Co-morbidities (includes updates in patient status) 1 10 X- 1 5 Reassessment of Adherence to Treatment Plan ASSESSMENTS - Wound and Skin Assessment / Reassessment X - Simple Wound Assessment / Reassessment - one wound 1 5  - 0 Complex Wound Assessment / Reassessment - multiple wounds  - 0 Dermatologic / Skin Assessment (not related to wound area) ASSESSMENTS - Focused Assessment  - Circumferential Edema Measurements - multi extremities 0  - 0 Nutritional Assessment / Counseling / Intervention  - 0 Lower Extremity Assessment (monofilament, tuning fork, pulses)  - 0 Peripheral Arterial Disease Assessment (using hand held doppler) ASSESSMENTS - Ostomy and/or Continence Assessment and Care  - Incontinence Assessment and Management 0  - 0 Ostomy Care Assessment and Management (repouching, etc.) PROCESS - Coordination of Care X - Simple Patient / Family Education for ongoing care 1 15  - 0 Complex (extensive) Patient / Family Education for ongoing care  - 0 Staff obtains Chiropractor, Records, Test Results / Process Orders  - 0 Staff telephones HHA, Nursing Homes / Clarify orders / etc  - 0 Routine Transfer to another Facility (non-emergent condition)  - 0 Routine Hospital Admission (non-emergent condition)  - 0 New Admissions / Manufacturing engineer / Ordering NPWT, Apligraf, etc.  - 0 Emergency Hospital Admission (emergent condition)  - 0 Simple Discharge Coordination [name], Brian ([id number])  - 0 Complex (extensive) Discharge Coordination PROCESS - Special Needs  - Pediatric / Minor Patient Management 0  - 0 Isolation Patient Management  - 0 Hearing / Language / Visual special needs  - 0 Assessment of Community assistance (transportation, D/C planning,  etc.)  - 0 Additional assistance / Altered mentation  - 0 Support Surface(s) Assessment (bed, cushion, seat, etc.) INTERVENTIONS - Wound Cleansing / Measurement  - Simple Wound  Cleansing - one wound 0 X- 2 5 Complex Wound Cleansing - multiple wounds X- 1 5 Wound Imaging (photographs - any number of wounds)  - 0 Wound Tracing (instead of photographs)  - 0 Simple Wound Measurement - one wound X- 2 5 Complex Wound Measurement - multiple wounds INTERVENTIONS - Wound Dressings X - Small Wound Dressing one or multiple wounds 2 10  - 0 Medium Wound Dressing one or multiple wounds  - 0 Large Wound Dressing one or multiple wounds  - 0 Application of Medications - topical  - 0 Application of Medications - injection INTERVENTIONS - Miscellaneous  - External ear exam 0  - 0 Specimen Collection (cultures, biopsies, blood, body fluids, etc.)  - 0 Specimen(s) / Culture(s) sent or taken to Lab for analysis  - 0 Patient Transfer (multiple staff / Nurse, adult / Similar devices)  - 0 Simple Staple / Suture removal (25 or less)  - 0 Complex Staple / Suture removal (26 or more)  - 0 Hypo / Hyperglycemic Management (close monitor of Blood Glucose)  - 0 Ankle / Brachial Index (ABI) - do not check if billed separately X- 1 5 Vital Signs Jessie, Wilmont ([id number]) Has the patient been seen at the hospital within the last three years: Yes Total Score: 85 Level Of Care: New/Established - Level 3 Electronic Signature(s) Signed: 12/24/2017 4:06:44 PM By: Renne Crigler Entered By: Renne Crigler on 12/24/2017 09:21:35 [name] ([id number]) -------------------------------------------------------------------------------- Encounter Discharge Information Details Patient Name: [name] Date of Service: 12/24/2017 8:45 AM Medical Record Number: [id number] Patient Account Number: [id number] Date of Birth/Sex: Jun 30, 1981 (37 y.o. M) Treating RN: Phillis Haggis Primary Care Jordain Radin: Abner Greenspan Other Clinician: Referring Juliahna Wiswell: Abner Greenspan Treating Josecarlos Harriott/Extender: Linwood Dibbles, HOYT Weeks in Treatment: 3 Encounter Discharge Information Items Discharge Condition: Stable Ambulatory Status: Ambulatory Discharge Destination: Skilled Nursing Facility Orders Sent: Yes Transportation: Other Accompanied By: self Schedule Follow-up Appointment: Yes Clinical Summary of Care: Electronic Signature(s) Signed: 12/24/2017 9:30:02 AM By: Alejandro Mulling Entered By: Alejandro Mulling on 12/24/2017 09:30:02 Norment, Jomarie Longs ([id number]) -------------------------------------------------------------------------------- Lower Extremity Assessment Details Patient Name: [name] Date of Service: 12/24/2017 8:45 AM Medical Record Number: [id number] Patient Account Number: [id number] Date of Birth/Sex: 1981-05-05 (37 y.o. M) Treating RN: Huel Coventry Primary Care Madesyn Ast: Abner Greenspan Other Clinician: Referring Alayza Pieper: Abner Greenspan Treating Ronne Savoia/Extender: Lenda Kelp Weeks in Treatment: 3 Electronic Signature(s) Signed: 12/25/2017 5:51:43 PM By: Elliot Gurney, BSN, RN, CWS, Kim RN, BSN Entered By: Elliot Gurney, BSN, RN, CWS, Kim on 12/24/2017 08:55:29 [name] ([id number]) -------------------------------------------------------------------------------- Multi Wound Chart Details Patient Name: [name] Date of Service: 12/24/2017 8:45 AM Medical Record Number: [id number] Patient Account Number: [id number] Date of Birth/Sex: May 28, 1981 (36 y.o. 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] [N/A:N/A] Comorbid History: [1:Anemia, Asthma, Hypertension, End Stage Renal Disease] [2:Anemia, Asthma, Hypertension, End Stage Renal Disease] [N/A:N/A] Date Acquired: [1:04/23/2017] [2:04/23/2017] [N/A:N/A] Weeks of Treatment: [1:3] [2:3] [N/A:N/A] Wound Status: [1:Open] [2:Open] [N/A:N/A] Measurements L x W x D [1:2.1x4x0.1] [2:0.4x1x0.1] [N/A:N/A] (cm) Area (cm) : [1:6.597] [2:0.314] [N/A:N/A] Volume (cm) : [1:0.66] [2:0.031] [N/A:N/A] % Reduction in Area: [  1:67.10%] [2:95.20%] [N/A:N/A] % Reduction in Volume: [1:67.10%] [2:95.30%] [N/A:N/A] Classification: [1:Full Thickness Without Exposed Support Structures] [2:Full Thickness Without Exposed Support Structures] [N/A:N/A] Exudate Amount: [1:Medium] [2:Large] [N/A:N/A] Exudate Type: [1:Serosanguineous] [2:Serosanguineous] [N/A:N/A] Exudate Color: [1:red, brown] [2:red, brown] [N/A:N/A] Wound Margin: [1:Flat and Intact] [2:Flat and Intact] [N/A:N/A] Granulation Amount: [1:Large (67-100%)] [2:Large (67-100%)] [N/A:N/A] Granulation Quality: [1:Red] [2:Red] [N/A:N/A] Necrotic Amount: [1:Small (1-33%)] [2:None Present (0%)] [N/A:N/A] Exposed Structures: [1:Fat Layer (Subcutaneous Tissue) Exposed: Yes Fascia: No Tendon: No Muscle: No Joint: No Bone: No] [2:Fat Layer (Subcutaneous Tissue) Exposed: Yes Fascia: No Tendon: No Muscle: No Joint: No Bone: No] [N/A:N/A] Epithelialization: [1:Small (1-33%)] [2:Small (1-33%)] [N/A:N/A] Periwound Skin Texture: [1:Excoriation: No Induration: No Callus: No Crepitus: No] [2:Excoriation: No Induration: No Callus: No Crepitus: No] [N/A:N/A] Rash: No Rash: No Scarring: No Scarring: No Periwound Skin Moisture: Maceration: No Maceration: No N/A Dry/Scaly: No Dry/Scaly: No Periwound Skin  Color: Hemosiderin Staining: Yes Atrophie Blanche: No N/A Atrophie Blanche: No Cyanosis: No Cyanosis: No Ecchymosis: No Ecchymosis: No Erythema: No Erythema: No Hemosiderin Staining: No Mottled: No Mottled: No Pallor: No Pallor: No Rubor: No Rubor: No Temperature: No Abnormality No Abnormality N/A Tenderness on Palpation: Yes No N/A Wound Preparation: Ulcer Cleansing: Ulcer Cleansing: N/A Rinsed/Irrigated with Saline Rinsed/Irrigated with Saline Topical Anesthetic Applied: Topical Anesthetic Applied: Other: lidocaine 4% Other: lidocaine 4% Treatment Notes Electronic Signature(s) Signed: 12/24/2017 4:06:44 PM By: Renne Crigler Entered By: Renne Crigler on 12/24/2017 09:18:55 Vandervelden, Jomarie Longs ([id number]) -------------------------------------------------------------------------------- Multi-Disciplinary Care Plan Details Patient Name: [name] Date of Service: 12/24/2017 8:45 AM Medical Record Number: [id number] Patient Account Number: [id number] Date of Birth/Sex: 01-22-1981 (37 y.o. M) Treating RN: Renne Crigler Primary Care Guerin Lashomb: Abner Greenspan Other Clinician: Referring Lundyn Coste: Abner Greenspan Treating Rhemi Balbach/Extender: Linwood Dibbles, HOYT Weeks in Treatment: 3 Active Inactive ` Orientation to the Wound Care Program Nursing Diagnoses: Knowledge deficit related to the wound healing center program Goals: Patient/caregiver will verbalize understanding of the Wound Healing Center Program Date Initiated: 12/03/2017 Target Resolution Date: 12/24/2017 Goal Status: Active Interventions: Provide education on orientation to the wound center Notes: ` Wound/Skin Impairment Nursing Diagnoses: Impaired tissue integrity Goals: Patient/caregiver will verbalize understanding of skin care regimen Date Initiated: 12/03/2017 Target Resolution Date: 12/24/2017 Goal Status: Active Ulcer/skin breakdown will have a volume reduction of 30% by week 4 Date Initiated:  12/03/2017 Target Resolution Date: 12/24/2017 Goal Status: Active Interventions: Assess patient/caregiver ability to obtain necessary supplies Assess patient/caregiver ability to perform ulcer/skin care regimen upon admission and as needed Assess ulceration(s) every visit Treatment Activities: Skin care regimen initiated : 12/03/2017 Notes: Electronic Signature(s) Signed: 12/24/2017 4:06:44 PM By: French Ana, [name] Entered By: Renne Crigler on 12/24/2017 09:18:46 Attar, Jomarie Longs ([id number]) -------------------------------------------------------------------------------- Pain Assessment Details Patient Name: [name] Date of Service: 12/24/2017 8:45 AM Medical Record Number: [id number] Patient Account Number: [id number] Date of Birth/Sex: [birth year] (37 y.o. M) Treating RN: Huel Coventry Primary Care Harshitha Fretz: Abner Greenspan Other Clinician: Referring Truett Mcfarlan: Abner Greenspan Treating Krissie Merrick/Extender: Linwood Dibbles, HOYT Weeks in Treatment: 3 Active Problems Location of Pain Severity and Description of Pain Patient Has Paino No Site Locations Pain Management and Medication Current Pain Management: Electronic Signature(s) Signed: 12/25/2017 5:51:43 PM By: Elliot Gurney, BSN, RN, CWS, Kim RN, BSN Entered By: Elliot Gurney, BSN, RN, CWS, Kim on 12/24/2017 08:45:49 [name] ([id number]) -------------------------------------------------------------------------------- Patient/Caregiver Education Details Patient Name: [name] Date of Service: 12/24/2017 8:45 AM Medical Record Number: [id number] Patient Account Number: [id number] Date of Birth/Gender: Dec 28, 1980 (36 y.o. M) Treating RN: Phillis Haggis Primary Care Physician:  HODGES, BETH Other Clinician: Referring Physician: Yetta Flock, BETH Treating Physician/Extender: Linwood Dibbles, HOYT Weeks in Treatment: 3 Education Assessment Education Provided To: Patient Education Topics Provided Wound/Skin Impairment: Handouts: Caring  for Your Ulcer, Other: change dressing as ordered Methods: Demonstration, Explain/Verbal Responses: State content correctly Electronic Signature(s) Signed: 12/24/2017 3:58:02 PM By: Alejandro Mulling Entered By: Alejandro Mulling on 12/24/2017 09:30:16 Hach, Tyndall ([id number]) -------------------------------------------------------------------------------- Wound Assessment Details Patient Name: [name] Date of Service: 12/24/2017 8:45 AM Medical Record Number: [id number] Patient Account Number: [id number] Date of Birth/Sex: July 31, 1981 (37 y.o. M) Treating RN: Huel Coventry Primary Care Mayleen Borrero: Abner Greenspan Other Clinician: Referring Josphine Laffey: Abner Greenspan Treating Leylanie Woodmansee/Extender: Linwood Dibbles, HOYT Weeks in Treatment: 3 Wound Status Wound Number: 1 Primary Calciphylaxis Etiology: Wound Location: Right Upper Leg - Anterior Wound Status: Open Wounding Event: Gradually Appeared Comorbid Anemia, Asthma, Hypertension, End Stage Date Acquired: 04/23/2017 History: Renal Disease Weeks Of Treatment: 3 Clustered Wound: No Wound Measurements Length: (cm) 2.1 Width: (cm) 4 Depth: (cm) 0.1 Area: (cm) 6.597 Volume: (cm) 0.66 % Reduction in Area: 67.1% % Reduction in Volume: 67.1% Epithelialization: Small (1-33%) Tunneling: No Undermining: No Wound Description Full Thickness Without Exposed Support Foul Od Classification: Structures Slough/ Wound Margin: Flat and Intact Exudate Medium Amount: Exudate Type: Serosanguineous Exudate Color: red, brown or After Cleansing: No Fibrino Yes Wound Bed Granulation Amount: Large (67-100%) Exposed Structure Granulation Quality: Red Fascia Exposed: No Necrotic Amount: Small (1-33%) Fat Layer (Subcutaneous Tissue) Exposed: Yes Necrotic Quality: Adherent Slough Tendon Exposed: No Muscle Exposed: No Joint Exposed: No Bone Exposed: No Periwound Skin Texture Texture Color No Abnormalities Noted: No No Abnormalities Noted: No Callus:  No Atrophie Blanche: No Crepitus: No Cyanosis: No Excoriation: No Ecchymosis: No Induration: No Erythema: No Rash: No Hemosiderin Staining: Yes Scarring: No Mottled: No Pallor: No Moisture Rubor: No No Abnormalities Noted: No Dry / Scaly: No Temperature / Pain Bolt, Hamish ([id number]) Maceration: No Temperature: No Abnormality Tenderness on Palpation: Yes Wound Preparation Ulcer Cleansing: Rinsed/Irrigated with Saline Topical Anesthetic Applied: Other: lidocaine 4%, Treatment Notes Wound #1 (Right, Anterior Upper Leg) 1. Cleansed with: Clean wound with Normal Saline 2. Anesthetic Topical Lidocaine 4% cream to wound bed prior to debridement 3. Peri-wound Care: Skin Prep 4. Dressing Applied: Hydrafera Blue 5. Secondary Dressing Applied Bordered Foam Dressing Electronic Signature(s) Signed: 12/25/2017 5:51:43 PM By: Elliot Gurney, BSN, RN, CWS, Kim RN, BSN Entered By: Elliot Gurney, BSN, RN, CWS, Kim on 12/24/2017 08:52:46 BURLIN, MCNAIR ([id number]) -------------------------------------------------------------------------------- Wound Assessment Details Patient Name: [name] Date of Service: 12/24/2017 8:45 AM Medical Record Number: [id number] Patient Account Number: [id number] Date of Birth/Sex: 1981-08-14 (36 y.o. M) Treating RN: Huel Coventry Primary Care Harrie Cazarez: Abner Greenspan Other Clinician: Referring Shaquitta Burbridge: Abner Greenspan Treating Kris No/Extender: Linwood Dibbles, HOYT Weeks in Treatment: 3 Wound Status Wound Number: 2 Primary Calciphylaxis Etiology: Wound Location: Left Upper Leg - Medial Wound Status: Open Wounding Event: Gradually Appeared Comorbid Anemia, Asthma, Hypertension, End Stage Date Acquired: 04/23/2017 History: Renal Disease Weeks Of Treatment: 3 Clustered Wound: No Wound Measurements Length: (cm) 0.4 Width: (cm) 1 Depth: (cm) 0.1 Area: (cm) 0.314 Volume: (cm) 0.031 % Reduction in Area: 95.2% % Reduction in Volume: 95.3% Epithelialization: Small  (1-33%) Tunneling: No Undermining: No Wound Description Full Thickness Without Exposed Support Foul Od Classification: Structures Slough/ Wound Margin: Flat and Intact Exudate Large Amount: Exudate Type: Serosanguineous Exudate Color: red, brown or After Cleansing: No Fibrino Yes Wound Bed Granulation Amount: Large (67-100%) Exposed Structure Granulation Quality: Red Fascia Exposed: No Necrotic Amount: None Present (0%)  Fat Layer (Subcutaneous Tissue) Exposed: Yes Tendon Exposed: No Muscle Exposed: No Joint Exposed: No Bone Exposed: No Periwound Skin Texture Texture Color No Abnormalities Noted: No No Abnormalities Noted: No Callus: No Atrophie Blanche: No Crepitus: No Cyanosis: No Excoriation: No Ecchymosis: No Induration: No Erythema: No Rash: No Hemosiderin Staining: No Scarring: No Mottled: No Pallor: No Moisture Rubor: No No Abnormalities Noted: No Dry / Scaly: No Temperature / Pain Brookshire, Deshay ([id number]) Maceration: No Temperature: No Abnormality Wound Preparation Ulcer Cleansing: Rinsed/Irrigated with Saline Topical Anesthetic Applied: Other: lidocaine 4%, Treatment Notes Wound #2 (Left, Medial Upper Leg) 1. Cleansed with: Clean wound with Normal Saline 2. Anesthetic Topical Lidocaine 4% cream to wound bed prior to debridement 3. Peri-wound Care: Skin Prep 4. Dressing Applied: Hydrafera Blue 5. Secondary Dressing Applied Bordered Foam Dressing Electronic Signature(s) Signed: 12/25/2017 5:51:43 PM By: Elliot Gurney, BSN, RN, CWS, Kim RN, BSN Entered By: Elliot Gurney, BSN, RN, CWS, Kim on 12/24/2017 08:55:04 ORLANDIS, SANDEN ([id number]) -------------------------------------------------------------------------------- Vitals Details Patient Name: [name] Date of Service: 12/24/2017 8:45 AM Medical Record Number: [id number] Patient Account Number: [id number] Date of Birth/Sex: 28-Nov-1980 (36 y.o. M) Treating RN: Huel Coventry Primary Care Dilpreet Faires: Abner Greenspan Other Clinician: Referring Danyelle Brookover: Abner Greenspan Treating Vianney Kopecky/Extender: Linwood Dibbles, HOYT Weeks in Treatment: 3 Vital Signs Time Taken: 08:45 Temperature (F): 98.0 Height (in): 70 Pulse (bpm): 66 Weight (lbs): 245 Respiratory Rate (breaths/min): 16 Body Mass Index (BMI): 35.2 Blood Pressure (mmHg): 92/75 Reference Range: 80 - 120 mg / dl Electronic Signature(s) Signed: 12/25/2017 5:51:43 PM By: Elliot Gurney, BSN, RN, CWS, Kim RN, BSN Entered By: Elliot Gurney, BSN, RN, CWS, Kim on 12/24/2017 08:46:05

## 2017-12-31 ENCOUNTER — Encounter: Payer: Medicaid Other | Admitting: Physician Assistant

## 2017-12-31 DIAGNOSIS — L97112 Non-pressure chronic ulcer of right thigh with fat layer exposed: Secondary | ICD-10-CM | POA: Diagnosis not present

## 2018-01-02 NOTE — Progress Notes (Signed)
Brian, [name] ([id number]) Visit Report for 12/31/2017 Chief Complaint Document Details Patient Name: [name], [name] Date of Service: 12/31/2017 9:30 AM Medical Record Number: [id number] Patient Account Number: [id number] Date of Birth/Sex: [birth year] (37 y.o. M) Treating RN: Renne Crigler Primary Care Provider: Abner Greenspan Other Clinician: Referring Provider: Abner Greenspan Treating Provider/Extender: Linwood Dibbles, HOYT Weeks in Treatment: 4 Information Obtained from: Patient Chief Complaint Bilateral thigh Calciphylaxis Ulcers Electronic Signature(s) Signed: 01/01/2018 4:45:04 PM By: Lenda Kelp PA-C Entered By: Lenda Kelp on 12/31/2017 09:41:40 [place], [place] ([id number]) -------------------------------------------------------------------------------- HPI Details Patient Name: [name] Date of Service: 12/31/2017 9:30 AM Medical Record Number: [id number] Patient Account Number: [id number] Date of Birth/Sex: 06-Jan-1981 (37 y.o. 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 He has currently been undergoing treatment according to notes reviewed from 11/28/17 for this. 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 Subsequently patient was using Santyl for very long time but is no longer using that at this point. He is receiving sodium thiosulfate during hemodialysis along with Sevelamer to help control his phosphorus levels. Currently a steroid cream was recommended for the wounds although he has not used that yet. He a has what appears to be extremely hyper granular and poorly  attached granulation noted over the bed of the wound. This is true of both wounds of lower extremities. He does show some signs of epithelialization around the border although this has been very slow according to the patient. Fortunately he is not having significant pain at this point. He does have a history of hypertension as well is congestive heart failure. Otherwise he states the wounds are much better than when I first started. 12/10/17 on evaluation today patient actually presents with signs of improvement in regard to his bilateral thigh ulcer. He has been tolerating the dressing changes without complication which is good news. With that being said is not really having a significant amount of bleeding at this point today is excellent. He has been pleased with the progress as well. There's no evidence of infection. 12/19/17; patient normally followed by Allen Derry on Mondays. In his absence he comes in to see me today. 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 Initially painful necrotic wounds that have been debrided to a healthy surface. He is using Hydrofera Blue. He has one on the right anterior thigh and one on the left 12/24/17 on evaluation today patient's wounds actually appear to be doing excellent in regard to the bilateral upper thighs. He has been tolerating the dressing changes without complication and overall he seems to be making great progress especially on the left. He has no pain. 12/31/17 on evaluation today patient appears to be doing excellent in regard to his bilateral lower extremity ulcers. The left in fact appears to be completely healed which is great news the right is smaller and doing very well. Overall I'm pleased with the progress he's made the patient is very happy. Electronic Signature(s) Signed: 01/01/2018 4:45:04 PM By: Lenda Kelp PA-C Entered By: Lenda Kelp on 12/31/2017 10:16:56 [place], [place]  ([id number]) -------------------------------------------------------------------------------- Physical Exam Details Patient Name: [name] Date of Service: 12/31/2017 9:30 AM Medical Record  Number: [id number] Patient Account Number: [id number] Date of Birth/Sex: 04/16/1981 (37 y.o. M) Treating RN: Renne Crigler Primary Care Provider: Abner Greenspan Other Clinician: Referring Provider: Abner Greenspan Treating Provider/Extender: STONE III, HOYT Weeks in Treatment: 4 Constitutional Obese and well-hydrated in no acute distress. Respiratory normal breathing without difficulty. clear to auscultation bilaterally. Cardiovascular regular rate and rhythm with normal S1, S2. Psychiatric this patient is able to make decisions and demonstrates good insight into disease process. Alert and Oriented x 3. pleasant and cooperative. Notes Currently the patient's wounds on the left have completely resolved on the right this is much smaller and a good granulation bed especially around the border. The [facility] Blue Dressing is to be doing excellent for him and I'm gonna recommend that we continue with this currently. Electronic Signature(s) Signed: 01/01/2018 4:45:04 PM By: Lenda Kelp PA-C Entered By: Lenda Kelp on 12/31/2017 10:18:02 [place], [place] ([id number]) -------------------------------------------------------------------------------- Physician Orders Details Patient Name: [name] Date of Service: 12/31/2017 9:30 AM Medical Record Number: [id number] Patient Account Number: [id number] Date of Birth/Sex: 1981/03/24 (37 y.o. 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 o Clean wound with Normal Saline. Wound #2 Left,Medial Upper Leg o Clean wound with Normal Saline. o Clean wound with Normal Saline. Anesthetic (add to Medication List) Wound #1 Right,Anterior Upper Leg o Topical Lidocaine 4% cream applied to wound bed prior to debridement (In Clinic Only). o Topical Lidocaine 4% cream applied to wound bed prior to debridement (In Clinic Only). Wound #2 Left,Medial Upper Leg o Topical Lidocaine 4% cream applied to wound bed prior to debridement (In Clinic Only). o Topical Lidocaine 4% cream applied to wound bed prior to debridement (In Clinic Only). Skin Barriers/Peri-Wound Care Wound #1 Right,Anterior Upper Leg o Skin Prep Wound #2 Left,Medial Upper Leg o Skin Prep Primary Wound Dressing Wound #1 Right,Anterior Upper Leg o Hydrafera Blue Ready Transfer Secondary Dressing Wound #1 Right,Anterior Upper Leg o Boardered Foam Dressing Wound #2 Left,Medial Upper Leg Kuyper, Trestin ([id number]) o Boardered Foam Dressing Dressing Change Frequency Wound #1 Right,Anterior Upper Leg o Change dressing every other day. Follow-up Appointments Wound #1 Right,Anterior Upper Leg o Return Appointment in 2 weeks. Wound #2 Left,Medial Upper Leg o Return Appointment in 2 weeks. Notes place boarder foam dressing on left upper leg healed wound for protection change every 3 days. change for 2 weeks then may discontinue order for dressing on left upper leg. Electronic Signature(s) Signed: 12/31/2017 5:05:05 PM By: Renne Crigler Signed: 01/01/2018 4:45:04 PM By: Lenda Kelp PA-C Entered By: Renne Crigler on 12/31/2017 09:54:56 Grippi, [place]  ([id number]) -------------------------------------------------------------------------------- Problem List Details Patient Name: [name] Date of Service: 12/31/2017 9:30 AM Medical Record Number: [id number] Patient Account Number: [id number] Date of Birth/Sex: [birth year] (36 y.o. M) Treating RN: Renne Crigler Primary Care Provider: Abner Greenspan Other Clinician: Referring Provider: Abner Greenspan Treating Provider/Extender: Linwood Dibbles, HOYT Weeks in Treatment: 4 Active Problems ICD-10 Impacting Encounter Code Description Active Date Wound Healing Diagnosis 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 12/03/2017 Yes L97.122 Non-pressure  chronic ulcer of left thigh with fat layer exposed 12/03/2017 Yes L97.112 Non-pressure chronic ulcer of right thigh with fat layer 12/03/2017 Yes exposed I10 Essential (primary) hypertension 12/03/2017 Yes I50.33 Acute on chronic diastolic (congestive) heart failure 12/03/2017 Yes Inactive Problems Resolved Problems Electronic Signature(s) Signed: 01/01/2018 4:45:04 PM By: Lenda Kelp PA-C Entered By: Lenda Kelp on 12/31/2017 09:41:32 [place], [place] ([id number]) -------------------------------------------------------------------------------- Progress Note Details Patient Name: [name] Date of Service: 12/31/2017 9:30 AM Medical Record Number: [id number] Patient Account Number: [id number] Date of Birth/Sex: 13-Dec-1980 (37 y.o. M) Treating RN: Renne Crigler Primary Care Provider: Abner Greenspan Other Clinician: Referring Provider: Abner Greenspan Treating Provider/Extender: Linwood Dibbles, HOYT Weeks in Treatment: 4 Subjective Chief Complaint Information obtained from Patient Bilateral thigh Calciphylaxis Ulcers History of Present Illness (HPI) 12/03/17 on evaluation today patient presents for initial evaluation and the wound center concerning issues he has been having with his bilateral lower extremities with Calciphylaxis. He has currently been  undergoing treatment according to notes reviewed from 11/28/17 for this. 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 Subsequently patient was using Santyl for very long time but is no longer using that at this point. He is receiving sodium thiosulfate during hemodialysis along with Sevelamer to help control his phosphorus levels. Currently a steroid cream was recommended for the wounds although he has not used that yet. He a has what appears to be extremely hyper granular and poorly attached granulation noted over the bed of the wound. This is true of both wounds of lower extremities. He does show some signs of epithelialization around the border although this has been very slow according to the patient. Fortunately he is not having significant pain at this point. He does have a history of hypertension as well is congestive heart failure. Otherwise he states the wounds are much better than when I first started. 12/10/17 on evaluation today patient actually presents with signs of improvement in regard to his bilateral thigh ulcer. He has been tolerating the dressing changes without complication which is good news. With that being said is not really having a significant amount of bleeding at this point today is excellent. He has been pleased with the progress as well. There's no evidence of infection. 12/19/17; patient normally followed by Allen Derry on Mondays. In his absence he comes in to see me today. 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 Initially painful necrotic wounds that have been debrided to a healthy surface. He is using Hydrofera Blue. He has one on the right anterior thigh and one on the left 12/24/17 on evaluation today patient's wounds actually appear to be doing excellent in regard to the bilateral upper thighs. He has been tolerating the dressing changes without complication and overall he seems to be  making great progress especially on the left. He has no pain. 12/31/17 on evaluation today patient appears to be doing excellent in regard to his bilateral lower extremity ulcers. The left in fact appears to be completely healed which is great news the right is smaller and doing very well. Overall I'm pleased with the progress he's made the patient is very happy. Patient History Information obtained from Patient. Family History Diabetes - Maternal Grandparents, Heart Disease - Maternal Grandparents, Hypertension - Maternal Grandparents, Stroke - Maternal Grandparents, No family history of Cancer, Hereditary Spherocytosis, Kidney Disease, Lung Disease, Seizures, Thyroid Problems, Tuberculosis. Social History Never smoker, Marital Status - Single, Alcohol Use - Never, Drug  Use - No History, Caffeine Use - Daily. Review of Systems (ROS) Doughty, Jaysean ([id number]) Constitutional Symptoms (General Health) Denies complaints or symptoms of Fever, Chills. Respiratory The patient has no complaints or symptoms. Cardiovascular The patient has no complaints or symptoms. Psychiatric The patient has no complaints or symptoms. Objective Constitutional Obese and well-hydrated in no acute distress. Vitals Time Taken: 9:30 AM, Height: 70 in, Weight: 245 lbs, BMI: 35.2, Temperature: 98.2 F, Pulse: 65 bpm, Respiratory Rate: 16 breaths/min, Blood Pressure: 124/70 mmHg. Respiratory normal breathing without difficulty. clear to auscultation bilaterally. Cardiovascular regular rate and rhythm with normal S1, S2. Psychiatric this patient is able to make decisions and demonstrates good insight into disease process. Alert and Oriented x 3. pleasant and cooperative. General Notes: Currently the patient's wounds on the left have completely resolved on the right this is much smaller and a good granulation bed especially around the border. The [facility] Blue Dressing is to be doing excellent for him and  I'm gonna recommend that we continue with this currently. Integumentary (Hair, Skin) Wound #1 status is Open. Original cause of wound was Gradually Appeared. The wound is located on the Right,Anterior Upper Leg. The wound measures 1.7cm length x 2.3cm width x 0.1cm depth; 3.071cm^2 area and 0.307cm^3 volume. There is Fat Layer (Subcutaneous Tissue) Exposed exposed. There is no tunneling or undermining noted. There is a medium amount of serosanguineous drainage noted. The wound margin is flat and intact. There is large (67-100%) red granulation within the wound bed. There is no necrotic tissue within the wound bed. The periwound skin appearance exhibited: Hemosiderin Staining. The periwound skin appearance did not exhibit: Callus, Crepitus, Excoriation, Induration, Rash, Scarring, Dry/Scaly, Maceration, Atrophie Blanche, Cyanosis, Ecchymosis, Mottled, Pallor, Rubor, Erythema. Periwound temperature was noted as No Abnormality. The periwound has tenderness on palpation. Wound #2 status is Healed - Epithelialized. Original cause of wound was Gradually Appeared. The wound is located on the Left,Medial Upper Leg. The wound measures 0cm length x 0cm width x 0cm depth; 0cm^2 area and 0cm^3 volume. Volta, FERGUSON ([id number]) Assessment Active Problems ICD-10 E83.59 - Other disorders of calcium metabolism L97.122 - Non-pressure chronic ulcer of left thigh with fat layer exposed L97.112 - Non-pressure chronic ulcer of right thigh with fat layer exposed I10 - Essential (primary) hypertension I50.33 - Acute on chronic diastolic (congestive) heart failure Plan Wound Cleansing: Wound #1 Right,Anterior Upper Leg: Clean wound with Normal Saline. Clean wound with Normal Saline. Wound #2 Left,Medial Upper Leg: Clean wound with Normal Saline. Clean wound with Normal Saline. Anesthetic (add to Medication List): Wound #1 Right,Anterior Upper Leg: Topical Lidocaine 4% cream applied to wound bed prior to  debridement (In Clinic Only). Topical Lidocaine 4% cream applied to wound bed prior to debridement (In Clinic Only). Wound #2 Left,Medial Upper Leg: Topical Lidocaine 4% cream applied to wound bed prior to debridement (In Clinic Only). Topical Lidocaine 4% cream applied to wound bed prior to debridement (In Clinic Only). Skin Barriers/Peri-Wound Care: Wound #1 Right,Anterior Upper Leg: Skin Prep Wound #2 Left,Medial Upper Leg: Skin Prep Primary Wound Dressing: Wound #1 Right,Anterior Upper Leg: Hydrafera Blue Ready Transfer Secondary Dressing: Wound #1 Right,Anterior Upper Leg: Boardered Foam Dressing Wound #2 Left,Medial Upper Leg: Boardered Foam Dressing Dressing Change Frequency: Wound #1 Right,Anterior Upper Leg: Change dressing every other day. Follow-up Appointments: Wound #1 Right,Anterior Upper Leg: Return Appointment in 2 weeks. Wound #2 Left,Medial Upper Leg: Return Appointment in 2 weeks. General Notes: place boarder foam dressing on left upper leg healed wound  for protection change every 3 days. change for 2 weeks then may discontinue order for dressing on left upper leg. JIMMYLEE, RATTERREE ([id number]) I'm going to recommend continuing with the [facility] Dressing point patients in agreement the plan. We will subsequently see were things stand in two weeks time we see him for reevaluation. If anything changes or worsens he will contact our office for additional recommendations. Otherwise I'm hopeful this will continue to show signs of good improvement as it has up to this point. Please see above for specific wound care orders. We will see patient for re-evaluation in 1 week(s) here in the clinic. If anything worsens or changes patient will contact our office for additional recommendations. Electronic Signature(s) Signed: 01/01/2018 4:45:04 PM By: Lenda Kelp PA-C Entered By: Lenda Kelp on 12/31/2017 10:18:27 [place], [place]  ([id number]) -------------------------------------------------------------------------------- ROS/PFSH Details Patient Name: [name] Date of Service: 12/31/2017 9:30 AM Medical Record Number: [id number] Patient Account Number: [id number] Date of Birth/Sex: 05-15-1981 (37 y.o. M) Treating RN: Renne Crigler Primary Care Provider: Abner Greenspan Other Clinician: Referring Provider: Abner Greenspan Treating Provider/Extender: STONE III, HOYT Weeks in Treatment: 4 Information Obtained From Patient Wound History Do you currently have one or more open woundso Yes How many open wounds do you currently haveo 2 Approximately how long have you had your woundso 6 months How have you been treating your wound(s) until nowo triamcinalone Has your wound(s) ever healed and then re-openedo No Have you had any lab work done in the past montho No Have you tested positive for an antibiotic resistant organism (MRSA, VRE)o No Have you tested positive for osteomyelitis (bone infection)o No Have you had any tests for circulation on your legso No Constitutional Symptoms (General Health) Complaints and Symptoms: Negative for: Fever; Chills Hematologic/Lymphatic Medical History: Positive for: Anemia Negative for: Hemophilia; Human Immunodeficiency Virus; Lymphedema; Sickle Cell Disease Respiratory Complaints and Symptoms: No Complaints or Symptoms Medical History: Positive for: Asthma - history Negative for: Aspiration; Chronic Obstructive Pulmonary Disease (COPD); Pneumothorax; Sleep Apnea; Tuberculosis Cardiovascular Complaints and Symptoms: No Complaints or Symptoms Medical History: Positive for: Hypertension Gastrointestinal Medical History: Negative for: Cirrhosis ; Colitis; Crohnos; Hepatitis A; Hepatitis B; Hepatitis C Genitourinary [place], [place] ([id number]) Medical History: Positive for: End Stage Renal Disease Immunological Medical History: Negative for: Lupus Erythematosus; Raynaudos;  Scleroderma Musculoskeletal Medical History: Negative for: Gout; Rheumatoid Arthritis; Osteoarthritis; Osteomyelitis Neurologic Medical History: Negative for: Dementia; Neuropathy Oncologic Medical History: Negative for: Received Chemotherapy; Received Radiation Psychiatric Complaints and Symptoms: No Complaints or Symptoms Immunizations Pneumococcal Vaccine: Received Pneumococcal Vaccination: Yes Implantable Devices Family and Social History Cancer: No; Diabetes: Yes - Maternal Grandparents; Heart Disease: Yes - Maternal Grandparents; Hereditary Spherocytosis: No; Hypertension: Yes - Maternal Grandparents; Kidney Disease: No; Lung Disease: No; Seizures: No; Stroke: Yes - Maternal Grandparents; Thyroid Problems: No; Tuberculosis: No; Never smoker; Marital Status - Single; Alcohol Use: Never; Drug Use: No History; Caffeine Use: Daily; Advanced Directives: No; Patient does not want information on Advanced Directives Physician Affirmation I have reviewed and agree with the above information. Electronic Signature(s) Signed: 12/31/2017 5:05:05 PM By: Renne Crigler Signed: 01/01/2018 4:45:04 PM By: Lenda Kelp PA-C Entered By: Lenda Kelp on 12/31/2017 10:17:23 [place], [place] ([id number]) -------------------------------------------------------------------------------- SuperBill Details Patient Name: [name] Date of Service: 12/31/2017 Medical Record Number: [id number] Patient Account Number: [id number] Date of Birth/Sex: 07/11/1981 (36 y.o. 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 L97.112 Non-pressure chronic ulcer of right thigh with fat layer exposed I10 Essential  (primary) hypertension I50.33 Acute on chronic diastolic (congestive) heart failure Facility Procedures CPT4 Code: [id number] Description: [phone] - WOUND CARE VISIT-LEV 2 EST PT Modifier: Quantity: 1 Physician Procedures CPT4 Code: [id number] Description: 99213 - WC PHYS LEVEL 3 - EST PT ICD-10 Diagnosis Description 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 L97.122 Non-pressure chronic ulcer of left thigh with fat layer ex L97.112 Non-pressure chronic ulcer of right thigh with fat layer e  I10 Essential (primary) hypertension Modifier: posed xposed Quantity: 1 Electronic Signature(s) Signed: 01/01/2018 4:45:04 PM By: Lenda Kelp PA-C Entered By: Lenda Kelp on 12/31/2017 10:18:44

## 2018-01-02 NOTE — Progress Notes (Signed)
YUMA, PACELLA ([id number]) Visit Report for 12/31/2017 Arrival Information Details Patient Name: [name], [name] Date of Service: 12/31/2017 9:30 AM Medical Record Number: [id number] Patient Account Number: [id number] Date of Birth/Sex: 22-Dec-1980 (37 y.o. M) Treating RN: Rema Jasmine Primary Care Zakiah Beckerman: Abner Greenspan Other Clinician: Referring Tytiana Coles: Abner Greenspan Treating Evaristo Tsuda/Extender: Linwood Dibbles, HOYT Weeks in Treatment: 4 Visit Information History Since Last Visit All ordered tests and consults were completed: No Patient Arrived: Ambulatory Added or deleted any medications: Yes Arrival Time: 09:34 Any new allergies or adverse reactions: No Accompanied By: self Had a fall or experienced change in No Transfer Assistance: None activities of daily living that may affect Patient Identification Verified: Yes risk of falls: Secondary Verification Process Yes Signs or symptoms of abuse/neglect since last visito No Completed: Hospitalized since last visit: No Patient Requires Transmission-Based No Implantable device outside of the clinic excluding No Precautions: cellular tissue based products placed in the center Patient Has Alerts: Yes since last visit: Patient Alerts: Patient on Blood Has Dressing in Place as Prescribed: Yes Thinner Pain Present Now: No Eliquis Electronic Signature(s) Signed: 01/01/2018 3:46:25 PM By: Rema Jasmine Entered By: Rema Jasmine on 12/31/2017 09:35:22 [name], [name] ([id number]) -------------------------------------------------------------------------------- Clinic Level of Care Assessment Details Patient Name: [name] Date of Service: 12/31/2017 9:30 AM Medical Record Number: [id number] Patient Account Number: [id number] Date of Birth/Sex: March 20, 1981 (37 y.o. M) Treating RN: Renne Crigler Primary Care Vanesha [place]: Abner Greenspan Other Clinician: Referring Annel Zunker: Abner Greenspan Treating Denys Salinger/Extender: Linwood Dibbles, HOYT Weeks in Treatment: 4 Clinic  Level of Care Assessment Items TOOL 4 Quantity Score X - Use when only an EandM is performed on FOLLOW-UP visit 1 0 ASSESSMENTS - Nursing Assessment / Reassessment X - Reassessment of Co-morbidities (includes updates in patient status) 1 10 X- 1 5 Reassessment of Adherence to Treatment Plan ASSESSMENTS - Wound and Skin Assessment / Reassessment X - Simple Wound Assessment / Reassessment - one wound 1 5 []  - 0 Complex Wound Assessment / Reassessment - multiple wounds []  - 0 Dermatologic / Skin Assessment (not related to wound area) ASSESSMENTS - Focused Assessment []  - Circumferential Edema Measurements - multi extremities 0 []  - 0 Nutritional Assessment / Counseling / Intervention []  - 0 Lower Extremity Assessment (monofilament, tuning fork, pulses) []  - 0 Peripheral Arterial Disease Assessment (using hand held doppler) ASSESSMENTS - Ostomy and/or Continence Assessment and Care []  - Incontinence Assessment and Management 0 []  - 0 Ostomy Care Assessment and Management (repouching, etc.) PROCESS - Coordination of Care X - Simple Patient / Family Education for ongoing care 1 15 []  - 0 Complex (extensive) Patient / Family Education for ongoing care []  - 0 Staff obtains Chiropractor, Records, Test Results / Process Orders []  - 0 Staff telephones HHA, Nursing Homes / Clarify orders / etc []  - 0 Routine Transfer to another Facility (non-emergent condition) []  - 0 Routine Hospital Admission (non-emergent condition) []  - 0 New Admissions / Manufacturing engineer / Ordering NPWT, Apligraf, etc. []  - 0 Emergency Hospital Admission (emergent condition) X- 1 10 Simple Discharge Coordination [place], Jarod ([id number]) []  - 0 Complex (extensive) Discharge Coordination PROCESS - Special Needs []  - Pediatric / Minor Patient Management 0 []  - 0 Isolation Patient Management []  - 0 Hearing / Language / Visual special needs []  - 0 Assessment of Community assistance (transportation, D/C  planning, etc.) []  - 0 Additional assistance / Altered mentation []  - 0 Support Surface(s) Assessment (bed, cushion, seat, etc.) INTERVENTIONS - Wound Cleansing / Measurement X - Simple Wound  Cleansing - one wound 1 5  - 0 Complex Wound Cleansing - multiple wounds X- 1 5 Wound Imaging (photographs - any number of wounds)  - 0 Wound Tracing (instead of photographs) X- 1 5 Simple Wound Measurement - one wound  - 0 Complex Wound Measurement - multiple wounds INTERVENTIONS - Wound Dressings X - Small Wound Dressing one or multiple wounds 1 10  - 0 Medium Wound Dressing one or multiple wounds  - 0 Large Wound Dressing one or multiple wounds  - 0 Application of Medications - topical  - 0 Application of Medications - injection INTERVENTIONS - Miscellaneous  - External ear exam 0  - 0 Specimen Collection (cultures, biopsies, blood, body fluids, etc.)  - 0 Specimen(s) / Culture(s) sent or taken to Lab for analysis  - 0 Patient Transfer (multiple staff / Nurse, adult / Similar devices)  - 0 Simple Staple / Suture removal (25 or less)  - 0 Complex Staple / Suture removal (26 or more)  - 0 Hypo / Hyperglycemic Management (close monitor of Blood Glucose)  - 0 Ankle / Brachial Index (ABI) - do not check if billed separately X- 1 5 Vital Signs [name], [name] ([id number]) Has the patient been seen at the hospital within the last three years: Yes Total Score: 75 Level Of Care: New/Established - Level 2 Electronic Signature(s) Signed: 12/31/2017 5:05:05 PM By: Renne Crigler Entered By: Renne Crigler on 12/31/2017 09:56:20 [name], [name] ([id number]) -------------------------------------------------------------------------------- Encounter Discharge Information Details Patient Name: [name] Date of Service: 12/31/2017 9:30 AM Medical Record Number: [id number] Patient Account Number: [id number] Date of Birth/Sex: February 23, 1981 (37 y.o. M) Treating RN:  Huel Coventry Primary Care Omarian Jaquith: Abner Greenspan Other Clinician: Referring Karilyn Wind: Abner Greenspan Treating Shacora Zynda/Extender: Linwood Dibbles, HOYT Weeks in Treatment: 4 Encounter Discharge Information Items Discharge Condition: Stable Ambulatory Status: Ambulatory Discharge Destination: Skilled Nursing Facility Telephoned: No Orders Sent: Yes Transportation: Private Auto Accompanied By: self Schedule Follow-up Appointment: Yes Clinical Summary of Care: Electronic Signature(s) Signed: 12/31/2017 6:18:07 PM By: Elliot Gurney, BSN, RN, CWS, Kim RN, BSN Entered By: Elliot Gurney, BSN, RN, CWS, Kim on 12/31/2017 10:08:45 [name] ([id number]) -------------------------------------------------------------------------------- Lower Extremity Assessment Details Patient Name: [name] Date of Service: 12/31/2017 9:30 AM Medical Record Number: [id number] Patient Account Number: [id number] Date of Birth/Sex: 05/27/1981 (36 y.o. M) Treating RN: Rema Jasmine Primary Care Talayah Picardi: Abner Greenspan Other Clinician: Referring Ransom Nickson: Abner Greenspan Treating Dewell Monnier/Extender: Linwood Dibbles, HOYT Weeks in Treatment: 4 Electronic Signature(s) Signed: 01/01/2018 3:46:25 PM By: Rema Jasmine Entered By: Rema Jasmine on 12/31/2017 09:40:06 [name], [name] ([id number]) -------------------------------------------------------------------------------- Multi Wound Chart Details Patient Name: [name] Date of Service: 12/31/2017 9:30 AM Medical Record Number: [id number] Patient Account Number: [id number] Date of Birth/Sex: [birth year] (37 y.o. 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] [N/A:N/A] Comorbid History: [1:Anemia, Asthma, Hypertension, End Stage Renal Disease] [2:N/A] [N/A:N/A] Date Acquired: [1:04/23/2017] [2:04/23/2017] [N/A:N/A] Weeks of Treatment: [1:4] [2:4] [N/A:N/A] Wound Status: [1:Open] [2:Open] [N/A:N/A] Measurements L x W x D [1:1.7x2.3x0.1] [2:0.1x0.1x0.1] [N/A:N/A] (cm) Area (cm) : [1:3.071] [2:0.008] [N/A:N/A] Volume (cm) : [1:0.307] [2:0.001] [N/A:N/A] % Reduction in Area: [1:84.70%] [2:99.90%] [N/A:N/A] %  Reduction in Volume: [1:84.70%] [2:99.80%] [N/A:N/A] Classification: [1:Full Thickness Without Exposed Support Structures] [2:Full Thickness Without Exposed Support Structures] [N/A:N/A] Exudate Amount: [1:Medium] [2:N/A] [N/A:N/A] Exudate Type: [1:Serosanguineous] [2:N/A] [N/A:N/A] Exudate Color: [1:red, brown] [2:N/A] [N/A:N/A] Wound Margin: [1:Flat and Intact] [2:N/A] [N/A:N/A] Granulation Amount: [1:Large (67-100%)] [2:N/A] [N/A:N/A] Granulation Quality: [1:Red] [2:N/A] [N/A:N/A] Necrotic Amount: [1:None Present (0%)] [2:N/A] [N/A:N/A] Exposed Structures: [1:Fat Layer (Subcutaneous Tissue) Exposed: Yes Fascia: No Tendon: No Muscle: No Joint: No Bone: No] [2:N/A] [N/A:N/A] Epithelialization: [1:Small (1-33%)] [2:N/A] [N/A:N/A] Periwound Skin Texture: [1:Excoriation: No Induration: No Callus: No Crepitus: No] [2:No Abnormalities Noted] [N/A:N/A] Rash: No Scarring: No Periwound Skin Moisture: Maceration: No No Abnormalities Noted N/A Dry/Scaly: No Periwound Skin Color: Hemosiderin Staining: Yes No Abnormalities Noted N/A Atrophie Blanche: No Cyanosis: No Ecchymosis: No Erythema: No Mottled: No Pallor: No Rubor: No Temperature: No Abnormality N/A N/A Tenderness on Palpation: Yes No N/A Wound Preparation: Ulcer  Cleansing: N/A N/A Rinsed/Irrigated with Saline Topical Anesthetic Applied: Other: lidocaine 4% Treatment Notes Electronic Signature(s) Signed: 12/31/2017 5:05:05 PM By: Renne Crigler Entered By: Renne Crigler on 12/31/2017 09:51:18 [name], [name] ([id number]) -------------------------------------------------------------------------------- Multi-Disciplinary Care Plan Details Patient Name: [name] Date of Service: 12/31/2017 9:30 AM Medical Record Number: [id number] Patient Account Number: [id number] Date of Birth/Sex: 1980/10/20 (37 y.o. M) Treating RN: Renne Crigler Primary Care Tremel Setters: Abner Greenspan Other Clinician: Referring Tennis Mckinnon: Abner Greenspan Treating Legacie Dillingham/Extender: Linwood Dibbles, HOYT Weeks in Treatment: 4 Active Inactive ` Orientation to the Wound Care Program Nursing Diagnoses: Knowledge deficit related to the wound healing center program Goals: Patient/caregiver will verbalize understanding of the Wound Healing Center Program Date Initiated: 12/03/2017 Target Resolution Date: 12/24/2017 Goal Status: Active Interventions: Provide education on orientation to the wound center Notes: ` Wound/Skin Impairment Nursing Diagnoses: Impaired tissue integrity Goals: Patient/caregiver will verbalize understanding of skin care regimen Date Initiated: 12/03/2017 Target Resolution Date: 12/24/2017 Goal Status: Active Ulcer/skin breakdown will have a volume reduction of 30% by week 4 Date Initiated: 12/03/2017 Target Resolution Date: 12/24/2017 Goal Status: Active Interventions: Assess patient/caregiver ability to obtain necessary supplies Assess patient/caregiver ability to perform ulcer/skin care regimen upon admission and as needed Assess ulceration(s) every visit Treatment Activities: Skin care regimen initiated : 12/03/2017 Notes: Electronic Signature(s) Signed: 12/31/2017 5:05:05 PM By: French Ana, [name] ([id number]) Entered By: Renne Crigler on  12/31/2017 09:51:01 [place], [name] ([id number]) -------------------------------------------------------------------------------- Pain Assessment Details Patient Name: [name] Date of Service: 12/31/2017 9:30 AM Medical Record Number: [id number] Patient Account Number: [id number] Date of Birth/Sex: 09-02-1980 (37 y.o. M) Treating RN: Rema Jasmine Primary Care Bailley [place]: Abner Greenspan Other Clinician: Referring Gracia Saggese: Abner Greenspan Treating Abigaile Rossie/Extender: Linwood Dibbles, HOYT Weeks in Treatment: 4 Active Problems Location of Pain Severity and Description of Pain Patient Has Paino No Site Locations With Dressing Change: No Pain Management and Medication Current Pain Management: Electronic Signature(s) Signed: 01/01/2018 3:46:25 PM By: Rema Jasmine Entered By: Rema Jasmine on 12/31/2017 09:37:44 [name], [name] ([id number]) -------------------------------------------------------------------------------- Patient/Caregiver Education Details Patient Name: [name] Date of Service: 12/31/2017 9:30 AM Medical Record Number: [id number] Patient Account Number: [id number] Date of Birth/Gender: Nov 25, 1980 (36 y.o. M) Treating RN: Huel Coventry Primary Care Physician: Abner Greenspan Other Clinician: Referring Physician: Abner Greenspan Treating Physician/Extender: Skeet Simmer in Treatment: 4 Education Assessment Education Provided To: Patient Education Topics Provided Wound/Skin Impairment: Handouts: Caring for Your Ulcer, Other: CONTINUE WOUND CARE AS PRESCRIBED Methods: Demonstration Responses: State content correctly Electronic Signature(s) Signed: 12/31/2017 6:18:07 PM By: Elliot Gurney, BSN, RN, CWS, Kim RN, BSN Entered By: Elliot Gurney, BSN, RN, CWS,  Kim on 12/31/2017 10:09:37 [name], [name] ([id number]) -------------------------------------------------------------------------------- Wound Assessment Details Patient Name: [name], [name] Date of Service: 12/31/2017 9:30 AM Medical Record Number:  [id number] Patient Account Number: [id number] Date of Birth/Sex: 1981/06/29 (37 y.o. M) Treating RN: Rema Jasmine Primary Care Jojuan Champney: Abner Greenspan Other Clinician: Referring Keyry Iracheta: Abner Greenspan Treating Alcario Tinkey/Extender: STONE III, HOYT Weeks in Treatment: 4 Wound Status Wound Number: 1 Primary Calciphylaxis Etiology: Wound Location: Right Upper Leg - Anterior Wound Status: Open Wounding Event: Gradually Appeared Comorbid Anemia, Asthma, Hypertension, End Stage Date Acquired: 04/23/2017 History: Renal Disease Weeks Of Treatment: 4 Clustered Wound: No Photos Photo Uploaded By: Curtis Sites on 12/31/2017 10:01:49 Wound Measurements Length: (cm) 1.7 Width: (cm) 2.3 Depth: (cm) 0.1 Area: (cm) 3.071 Volume: (cm) 0.307 % Reduction in Area: 84.7% % Reduction in Volume: 84.7% Epithelialization: Small (1-33%) Tunneling: No Undermining: No Wound Description Full Thickness Without Exposed Support Classification: Structures Wound Margin: Flat and Intact Exudate Medium Amount: Exudate Type: Serosanguineous Exudate Color: red, brown Foul Odor After Cleansing: No Slough/Fibrino Yes Wound Bed Granulation Amount: Large (67-100%) Exposed Structure Granulation Quality: Red Fascia Exposed: No Necrotic Amount: None Present (0%) Fat Layer (Subcutaneous Tissue) Exposed: Yes Tendon Exposed: No Muscle Exposed: No Joint Exposed: No Bone Exposed: No Waldman, Trooper ([id number]) Periwound Skin Texture Texture Color No Abnormalities Noted: No No Abnormalities Noted: No Callus: No Atrophie Blanche: No Crepitus: No Cyanosis: No Excoriation: No Ecchymosis: No Induration: No Erythema: No Rash: No Hemosiderin Staining: Yes Scarring: No Mottled: No Pallor: No Moisture Rubor: No No Abnormalities Noted: No Dry / Scaly: No Temperature / Pain Maceration: No Temperature: No Abnormality Tenderness on Palpation: Yes Wound Preparation Ulcer Cleansing: Rinsed/Irrigated with  Saline Topical Anesthetic Applied: Other: lidocaine 4%, Treatment Notes Wound #1 (Right, Anterior Upper Leg) 1. Cleansed with: Clean wound with Normal Saline 2. Anesthetic Topical Lidocaine 4% cream to wound bed prior to debridement 3. Peri-wound Care: Skin Prep 4. Dressing Applied: Prisma Ag 5. Secondary Dressing Applied Bordered Foam Dressing Notes Bordered Foam Dressing on Left for protection for 2 weeks. Electronic Signature(s) Signed: 01/01/2018 3:46:25 PM By: Rema Jasmine Entered By: Rema Jasmine on 12/31/2017 09:39:41 [name], [name] ([id number]) -------------------------------------------------------------------------------- Wound Assessment Details Patient Name: [name] Date of Service: 12/31/2017 9:30 AM Medical Record Number: [id number] Patient Account Number: [id number] Date of Birth/Sex: Jan 30, 1981 (37 y.o. M) Treating RN: Renne Crigler Primary Care Katlyne Nishida: Abner Greenspan Other Clinician: Referring Leyani Gargus: Abner Greenspan Treating [place]/Extender: STONE III, HOYT Weeks in Treatment: 4 Wound Status Wound Number: 2 Primary Etiology: Calciphylaxis Wound Location: Left, Medial Upper Leg Wound Status: Healed - Epithelialized Wounding Event: Gradually Appeared Date Acquired: 04/23/2017 Weeks Of Treatment: 4 Clustered Wound: No Photos Photo Uploaded By: Curtis Sites on 12/31/2017 10:01:50 Wound Measurements Length: (cm) 0 Width: (cm) 0 Depth: (cm) 0 Area: (cm) 0 Volume: (cm) 0 % Reduction in Area: 100% % Reduction in Volume: 100% Wound Description Full Thickness Without Exposed Support Classification: Structures Periwound Skin Texture Texture Color No Abnormalities Noted: No No Abnormalities Noted: No Moisture No Abnormalities Noted: No Electronic Signature(s) Signed: 12/31/2017 5:05:05 PM By: Renne Crigler Entered By: Renne Crigler on 12/31/2017 10:06:02 [name]  ([id number]) -------------------------------------------------------------------------------- Vitals Details Patient Name: [name] Date of Service: 12/31/2017 9:30 AM Medical Record Number: [id number] Patient Account Number: [id number] Date of Birth/Sex: 09-05-1980 (37 y.o. M) Treating RN: Rema Jasmine Primary Care Ladislav Caselli: Abner Greenspan Other Clinician: Referring Hang Ammon: Abner Greenspan Treating Shadee Rathod/Extender: STONE III, HOYT Weeks in Treatment: 4 Vital Signs Time Taken: 09:30 Temperature (F): 98.2 Height (in):  70 Pulse (bpm): 65 Weight (lbs): 245 Respiratory Rate (breaths/min): 16 Body Mass Index (BMI): 35.2 Blood Pressure (mmHg): 124/70 Reference Range: 80 - 120 mg / dl Electronic Signature(s) Signed: 01/01/2018 3:46:25 PM By: Rema Jasmine Entered By: Rema Jasmine on 12/31/2017 09:38:26

## 2018-01-09 ENCOUNTER — Ambulatory Visit: Payer: Medicaid Other | Admitting: Internal Medicine

## 2018-01-14 ENCOUNTER — Encounter: Payer: Medicaid Other | Attending: Internal Medicine | Admitting: Physician Assistant

## 2018-01-14 DIAGNOSIS — E669 Obesity, unspecified: Secondary | ICD-10-CM | POA: Diagnosis not present

## 2018-01-14 DIAGNOSIS — I132 Hypertensive heart and chronic kidney disease with heart failure and with stage 5 chronic kidney disease, or end stage renal disease: Secondary | ICD-10-CM | POA: Diagnosis not present

## 2018-01-14 DIAGNOSIS — Z6835 Body mass index (BMI) 35.0-35.9, adult: Secondary | ICD-10-CM | POA: Diagnosis not present

## 2018-01-14 DIAGNOSIS — I5033 Acute on chronic diastolic (congestive) heart failure: Secondary | ICD-10-CM | POA: Diagnosis not present

## 2018-01-14 DIAGNOSIS — N186 End stage renal disease: Secondary | ICD-10-CM | POA: Insufficient documentation

## 2018-01-14 DIAGNOSIS — Z833 Family history of diabetes mellitus: Secondary | ICD-10-CM | POA: Diagnosis not present

## 2018-01-14 DIAGNOSIS — Z823 Family history of stroke: Secondary | ICD-10-CM | POA: Diagnosis not present

## 2018-01-14 DIAGNOSIS — Z8249 Family history of ischemic heart disease and other diseases of the circulatory system: Secondary | ICD-10-CM | POA: Insufficient documentation

## 2018-01-14 DIAGNOSIS — L97112 Non-pressure chronic ulcer of right thigh with fat layer exposed: Secondary | ICD-10-CM | POA: Insufficient documentation

## 2018-01-14 DIAGNOSIS — L97122 Non-pressure chronic ulcer of left thigh with fat layer exposed: Secondary | ICD-10-CM | POA: Insufficient documentation

## 2018-01-15 NOTE — Progress Notes (Signed)
TORIBIO, SEIBER ([id number]) Visit Report for 01/14/2018 Chief Complaint Document Details Patient Name: [name], [name] Date of Service: 01/14/2018 8:30 AM Medical Record Number: [id number] Patient Account Number: [id number] Date of Birth/Sex: 06/26/1981 (37 y.o. M) Treating RN: Renne Crigler Primary Care Provider: Abner Greenspan Other Clinician: Referring Provider: Abner Greenspan Treating Provider/Extender: Linwood Dibbles, HOYT Weeks in Treatment: 6 Information Obtained from: Patient Chief Complaint Bilateral thigh Calciphylaxis Ulcers Electronic Signature(s) Signed: 01/14/2018 9:07:56 PM By: Lenda Kelp PA-C Entered By: Lenda Kelp on 01/14/2018 08:46:48 [place], [place] ([id number]) -------------------------------------------------------------------------------- HPI Details Patient Name: [name] Date of Service: 01/14/2018 8:30 AM Medical Record Number: [id number] Patient Account Number: [id number] Date of Birth/Sex: Apr 10, 1981 (37 y.o. 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 He has currently been undergoing treatment according to notes reviewed from 11/28/17 for this. 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 Subsequently patient was using Santyl for very long time but is no longer using that at this point. He is receiving sodium thiosulfate during hemodialysis along with Sevelamer to help control his phosphorus levels. Currently a steroid cream was recommended for the wounds although he has not used that yet. He a has what appears to be extremely hyper granular and poorly attached  granulation noted over the bed of the wound. This is true of both wounds of lower extremities. He does show some signs of epithelialization around the border although this has been very slow according to the patient. Fortunately he is not having significant pain at this point. He does have a history of hypertension as well is congestive heart failure. Otherwise he states the wounds are much better than when I first started. 12/10/17 on evaluation today patient actually presents with signs of improvement in regard to his bilateral thigh ulcer. He has been tolerating the dressing changes without complication which is good news. With that being said is not really having a significant amount of bleeding at this point today is excellent. He has been pleased with the progress as well. There's no evidence of infection. 12/19/17; patient normally followed by Allen Derry on Mondays. In his absence he comes in to see me today. 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 Initially painful necrotic wounds that have been debrided to a healthy surface. He is using Hydrofera Blue. He has one on the right anterior thigh and one on the left 12/24/17 on evaluation today patient's wounds actually appear to be doing excellent in regard to the bilateral upper thighs. He has been tolerating the dressing changes without complication and overall he seems to be making great progress especially on the left. He has no pain. 12/31/17 on evaluation today patient appears to be doing excellent in regard to his bilateral lower extremity ulcers. The left in fact appears to be completely healed which is great news the right is smaller and doing very well. Overall I'm pleased with the progress he's made the patient is very happy. 01/14/18 on evaluation today patient's ulcer on the right thigh actually appears to be doing much better. It's getting smaller. Unfortunately the left thigh ulcer reopen due to  the dressing having gotten stuck to the wound bed. Subsequently  this pulled off the top layer of skin and he does have an open wound at the site yet again. With that being said there does not appear to be evidence of infection overall things have been going well. Electronic Signature(s) Signed: 01/14/2018 9:07:56 PM By: Lenda Kelp PA-C Entered By: Lenda Kelp on 01/14/2018 08:56:47 TALLAN, SANDOZ ([id number]) -------------------------------------------------------------------------------- Physical Exam Details Patient Name: [name] Date of Service: 01/14/2018 8:30 AM Medical Record Number: [id number] Patient Account Number: [id number] Date of Birth/Sex: Apr 20, 1981 (37 y.o. M) Treating RN: Renne Crigler Primary Care Provider: Abner Greenspan Other Clinician: Referring Provider: Abner Greenspan Treating Provider/Extender: STONE III, HOYT Weeks in Treatment: 6 Constitutional Obese and well-hydrated in no acute distress. Respiratory normal breathing without difficulty. clear to auscultation bilaterally. Cardiovascular regular rate and rhythm with normal S1, S2. Psychiatric this patient is able to make decisions and demonstrates good insight into disease process. Alert and Oriented x 3. pleasant and cooperative. Notes Patient's wound at this point shows evidence of still some mild hyper granulation I do think the [facility] Dressing doing very well for him at this time. With that being said I am happy with the progress overall I do think we need to do something to protect the wounds from having new epithelium pulled off with dressing changes. Electronic Signature(s) Signed: 01/14/2018 9:07:56 PM By: Lenda Kelp PA-C Entered By: Lenda Kelp on 01/14/2018 08:57:30 [name], [name] ([id number]) -------------------------------------------------------------------------------- Physician Orders Details Patient Name: [name] Date of Service: 01/14/2018 8:30 AM Medical Record  Number: [id number] Patient Account Number: [id number] Date of Birth/Sex: 02/04/1981 (37 y.o. 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 o Clean wound with Normal Saline. Anesthetic (add to Medication List) Wound #1 Right,Anterior Upper Leg o Topical Lidocaine 4% cream applied to wound bed prior to debridement (In Clinic Only). o Topical Lidocaine 4% cream applied to wound bed prior to debridement (In Clinic Only). Skin Barriers/Peri-Wound Care Wound #1 Right,Anterior Upper Leg o Skin Prep Primary Wound Dressing Wound #1 Right,Anterior Upper Leg o Mepitel One Contact layer - apply first over wound o Hydrafera Blue Ready Transfer - place on top of mepitel Secondary Dressing Wound #1 Right,Anterior Upper Leg o Boardered Foam Dressing Dressing Change Frequency Wound #1 Right,Anterior Upper Leg o Change dressing every other day. Follow-up Appointments Wound #1 Right,Anterior Upper Leg o Return Appointment in 2 weeks. [place], [place] ([id number]) Electronic Signature(s) Signed: 01/14/2018 5:11:24 PM By: Renne Crigler Signed: 01/14/2018 9:07:56 PM By: Lenda Kelp PA-C Entered By: Renne Crigler on 01/14/2018 08:49:46 [name], [name] ([id number]) -------------------------------------------------------------------------------- Problem List Details Patient Name: [name] Date of  Service: 01/14/2018 8:30 AM Medical Record Number: [id number] Patient Account Number: [id number] Date of Birth/Sex: [birth year] (37 y.o. M) Treating RN: Renne Crigler Primary Care Provider: Abner Greenspan Other Clinician: Referring Provider: Abner Greenspan Treating Provider/Extender: Linwood Dibbles, HOYT Weeks in Treatment: 6 Active Problems ICD-10 Impacting Encounter Code Description Active Date Wound Healing Diagnosis 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 12/03/2017 No Yes L97.122 Non-pressure chronic ulcer of left  thigh with fat layer exposed 12/03/2017 No Yes L97.112 Non-pressure chronic ulcer of right thigh with fat layer 12/03/2017 No Yes exposed I10 Essential (primary) hypertension 12/03/2017 No Yes I50.33 Acute on chronic diastolic (congestive) heart failure 12/03/2017 No Yes Inactive Problems Resolved Problems Electronic Signature(s) Signed: 01/14/2018 9:07:56 PM By: Lenda Kelp PA-C Entered By: Lenda Kelp on 01/14/2018 08:46:40 [name], [name] ([id number]) -------------------------------------------------------------------------------- Progress Note Details Patient Name: [name] Date of Service: 01/14/2018 8:30 AM Medical Record Number: [id number] Patient Account Number: [id number] Date of Birth/Sex: 1980-12-14 (37 y.o. M) Treating RN: Renne Crigler Primary Care Provider: Abner Greenspan Other Clinician: Referring Provider: Abner Greenspan Treating Provider/Extender: Linwood Dibbles, HOYT Weeks in Treatment: 6 Subjective Chief Complaint Information obtained from Patient Bilateral thigh Calciphylaxis Ulcers History of Present Illness (HPI) 12/03/17 on evaluation today patient presents for initial evaluation and the wound center concerning issues he has been having with his bilateral lower extremities with Calciphylaxis. He has currently been undergoing treatment according to notes reviewed from 11/28/17 for this. He did have a biopsy stated per records which did definitively  diagnose Calciphylaxis. Subsequently patient was using Santyl for very long time but is no longer using that at this point. He is receiving sodium thiosulfate during hemodialysis along with Sevelamer to help control his phosphorus levels. Currently a steroid cream was recommended for the wounds although he has not used that yet. He a has what appears to be extremely hyper granular and poorly attached granulation noted over the bed of the wound. This is true of both wounds of lower extremities. He does show some signs of epithelialization around the border although this has been very slow according to the patient. Fortunately he is not having significant pain at this point. He does have a history of hypertension as well is congestive heart failure. Otherwise he states the wounds are much better than when I first started. 12/10/17 on evaluation today patient actually presents with signs of improvement in regard to his bilateral thigh ulcer. He has been tolerating the dressing changes without complication which is good news. With that being said is not really having a significant amount of bleeding at this point today is excellent. He has been pleased with the progress as well. There's no evidence of infection. 12/19/17; patient normally followed by Allen Derry on Mondays. In his absence he comes in to see me today. 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 Initially painful necrotic wounds that have been debrided to a healthy surface. He is using Hydrofera Blue. He has one on the right anterior thigh and one on the left 12/24/17 on evaluation today patient's wounds actually appear to be doing excellent in regard to the bilateral upper thighs. He has been tolerating the dressing changes without complication and overall he seems to be making great progress especially on the left. He has no pain. 12/31/17 on evaluation today patient appears to be doing excellent in regard  to his bilateral lower extremity ulcers. The left in fact appears to be completely healed which is great news the right is smaller and doing very well. Overall I'm pleased with the progress he's made the patient is very happy. 01/14/18 on evaluation today patient's ulcer on the right thigh actually appears to be doing much better. It's getting smaller. Unfortunately the left thigh ulcer reopen due to the dressing having gotten stuck to the wound bed. Subsequently this pulled off the top layer of skin and he does have an open wound  at the site yet again. With that being said there does not appear to be evidence of infection overall things have been going well. Patient History Information obtained from Patient. Family History Diabetes - Maternal Grandparents, Heart Disease - Maternal Grandparents, Hypertension - Maternal Grandparents, Stroke - Maternal Grandparents, No family history of Cancer, Hereditary Spherocytosis, Kidney Disease, Lung Disease, Seizures, Thyroid Problems, Tuberculosis. [name], [name] ([id number]) Social History Never smoker, Marital Status - Single, Alcohol Use - Never, Drug Use - No History, Caffeine Use - Daily. Review of Systems (ROS) Constitutional Symptoms (General Health) Denies complaints or symptoms of Fever, Chills. Respiratory The patient has no complaints or symptoms. Cardiovascular The patient has no complaints or symptoms. Gastrointestinal The patient has no complaints or symptoms. Psychiatric The patient has no complaints or symptoms. Objective Constitutional Obese and well-hydrated in no acute distress. Vitals Time Taken: 8:35 AM, Height: 70 in, Weight: 245 lbs, BMI: 35.2, Temperature: 97.7 F, Pulse: 60 bpm, Respiratory Rate: 16 breaths/min, Blood Pressure: 114/63 mmHg. Respiratory normal breathing without difficulty. clear to auscultation bilaterally. Cardiovascular regular rate and rhythm with normal S1, S2. Psychiatric this patient is able to  make decisions and demonstrates good insight into disease process. Alert and Oriented x 3. pleasant and cooperative. General Notes: Patient's wound at this point shows evidence of still some mild hyper granulation I do think the [facility] Dressing doing very well for him at this time. With that being said I am happy with the progress overall I do think we need to do something to protect the wounds from having new epithelium pulled off with dressing changes. Integumentary (Hair, Skin) Wound #1 status is Open. Original cause of wound was Gradually Appeared. The wound is located on the Right,Anterior Upper Leg. The wound measures 1.1cm length x 2.1cm width x 0.1cm depth; 1.814cm^2 area and 0.181cm^3 volume. There is Fat Layer (Subcutaneous Tissue) Exposed exposed. There is no tunneling or undermining noted. There is a medium amount of serosanguineous drainage noted. The wound margin is flat and intact. There is large (67-100%) red granulation within the wound bed. There is a small (1-33%) amount of necrotic tissue within the wound bed including Eschar and Adherent Slough. The periwound skin appearance exhibited: Hemosiderin Staining. The periwound skin appearance did not exhibit: Callus, Crepitus, Excoriation, Induration, Rash, Scarring, Dry/Scaly, Maceration, Atrophie Blanche, Cyanosis, Ecchymosis, Mottled, Pallor, Rubor, Erythema. Periwound temperature was noted as No Abnormality. The periwound has tenderness on palpation. [place], [place] ([id number]) Wound #3 status is Open. Original cause of wound was Shear/Friction. The wound is located on the Left,Medial Upper Leg. The wound measures 0.7cm length x 1.7cm width x 0.1cm depth; 0.935cm^2 area and 0.093cm^3 volume. There is no tunneling or undermining noted. There is a medium amount of serosanguineous drainage noted. The wound margin is flat and intact. There is large (67-100%) red granulation within the wound bed. There is a small (1-33%) amount  of necrotic tissue within the wound bed including Adherent Slough. Periwound temperature was noted as No Abnormality. The periwound has tenderness on palpation. 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 Clean wound with Normal Saline. Anesthetic (add to Medication List): Wound #1 Right,Anterior Upper Leg: Topical Lidocaine 4% cream applied to wound bed prior to debridement (In Clinic Only).  Topical Lidocaine 4% cream applied to wound bed prior to debridement (In Clinic Only). Skin Barriers/Peri-Wound Care: Wound #1 Right,Anterior Upper Leg: Skin Prep Primary Wound Dressing: Wound #1 Right,Anterior Upper Leg: Mepitel One Contact layer - apply first over wound Hydrafera Blue Ready Transfer - place on top of mepitel Secondary Dressing: Wound #1 Right,Anterior Upper Leg: Boardered Foam Dressing Dressing Change Frequency: Wound #1 Right,Anterior Upper Leg: Change dressing every other day. Follow-up Appointments: Wound #1 Right,Anterior Upper Leg: Return Appointment in 2 weeks. I am going to suggest currently that we go ahead and initiate treatment with the [facility] Dressing we been doing since this seems to be doing well. However we will apply mepitel to the wound bed prior to the application of [facility] [place], [place] ([id number]) Dressing to see if this will be of benefit as well. Hopefully this will prevent any sticking and damage to the wound bed itself. Patients in agreement with this plan. Please see above for specific wound care orders. We will see patient for re-evaluation in 2 week(s) here in the clinic. If anything worsens or  changes patient will contact our office for additional recommendations. Electronic Signature(s) Signed: 01/14/2018 9:07:56 PM By: Lenda Kelp PA-C Entered By: Lenda Kelp on 01/14/2018 08:58:07 [place], [place] ([id number]) -------------------------------------------------------------------------------- ROS/PFSH Details Patient Name: [name] Date of Service: 01/14/2018 8:30 AM Medical Record Number: [id number] Patient Account Number: [id number] Date of Birth/Sex: 1981/03/26 (37 y.o. M) Treating RN: Renne Crigler Primary Care Provider: Abner Greenspan Other Clinician: Referring Provider: Abner Greenspan Treating Provider/Extender: STONE III, HOYT Weeks in Treatment: 6 Information Obtained From Patient Wound History Do you currently have one or more open woundso Yes How many open wounds do you currently haveo 2 Approximately how long have you had your woundso 6 months How have you been treating your wound(s) until nowo triamcinalone Has your wound(s) ever healed and then re-openedo No Have you had any lab work done in the past montho No Have you tested positive for an antibiotic resistant organism (MRSA, VRE)o No Have you tested positive for osteomyelitis (bone infection)o No Have you had any tests for circulation on your legso No Constitutional Symptoms (General Health) Complaints and Symptoms: Negative for: Fever; Chills Hematologic/Lymphatic Medical History: Positive for: Anemia Negative for: Hemophilia; Human Immunodeficiency Virus; Lymphedema; Sickle Cell Disease Respiratory Complaints and Symptoms: No Complaints or Symptoms Medical History: Positive for: Asthma - history Negative for: Aspiration; Chronic Obstructive Pulmonary Disease (COPD); Pneumothorax; Sleep Apnea; Tuberculosis Cardiovascular Complaints and Symptoms: No Complaints or Symptoms Medical History: Positive for: Hypertension Gastrointestinal Complaints and Symptoms: No Complaints or Symptoms Medical  History: Negative for: Cirrhosis ; Colitis; Crohnos; Hepatitis A; Hepatitis B; Hepatitis C [name], [name] ([id number]) Genitourinary Medical History: Positive for: End Stage Renal Disease Immunological Medical History: Negative for: Lupus Erythematosus; Raynaudos; Scleroderma Musculoskeletal Medical History: Negative for: Gout; Rheumatoid Arthritis; Osteoarthritis; Osteomyelitis Neurologic Medical History: Negative for: Dementia; Neuropathy Oncologic Medical History: Negative for: Received Chemotherapy; Received Radiation Psychiatric Complaints and Symptoms: No Complaints or Symptoms Immunizations Pneumococcal Vaccine: Received Pneumococcal Vaccination: Yes Implantable Devices Family and Social History Cancer: No; Diabetes: Yes - Maternal Grandparents; Heart Disease: Yes - Maternal Grandparents; Hereditary Spherocytosis: No; Hypertension: Yes - Maternal Grandparents; Kidney Disease: No; Lung Disease: No; Seizures: No; Stroke: Yes - Maternal Grandparents; Thyroid Problems: No; Tuberculosis: No; Never smoker; Marital Status - Single; Alcohol Use: Never; Drug Use: No History; Caffeine Use: Daily; Advanced Directives: No; Patient does not want information on Advanced Directives Physician Affirmation I have reviewed and  agree with the above information. Electronic Signature(s) Signed: 01/14/2018 5:11:24 PM By: Renne CriglerFlinchum, [name] Signed: 01/14/2018 9:07:56 PM By: Lenda KelpStone III, Hoyt PA-C Entered By: Lenda KelpStone III, Hoyt on 01/14/2018 08:57:15 [name], [name] ([id number]) -------------------------------------------------------------------------------- SuperBill Details Patient Name: Brian FlackWEST, [name] Date of Service: 01/14/2018 Medical Record Number: [id number] Patient Account Number: [id number] Date of Birth/Sex: 1981/05/06 (37 y.o. 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 L97.112 Non-pressure chronic ulcer of right thigh with fat layer exposed I10 Essential (primary) hypertension I50.33 Acute on chronic diastolic (congestive) heart failure Facility Procedures CPT4 Code: [id number] Description: [id number] - WOUND CARE VISIT-LEV 2 EST PT Modifier: Quantity: 1 Physician Procedures CPT4 Code: [id number] Description: 99213 - WC PHYS LEVEL 3 - EST PT ICD-10 Diagnosis Description 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 L97.122 Non-pressure chronic ulcer of left thigh with fat layer ex L97.112 Non-pressure chronic ulcer of right thigh with fat layer e  I10 Essential (primary) hypertension Modifier: posed xposed Quantity: 1 Electronic Signature(s) Signed: 01/14/2018 9:07:56 PM By: Lenda KelpStone III, Hoyt PA-C Entered By: Lenda KelpStone III, Hoyt on 01/14/2018 08:58:28

## 2018-01-21 NOTE — Progress Notes (Signed)
JESHAWN, MELUCCI ([id number]) Visit Report for 01/14/2018 Arrival Information Details Patient Name: [name], [name] Date of Service: 01/14/2018 8:30 AM Medical Record Number: [id number] Patient Account Number: [id number] Date of Birth/Sex: May 21, 1981 (37 y.o. M) Treating RN: Phillis Haggis Primary Care Malayshia All: Abner Greenspan Other Clinician: Referring Latrisha Coiro: Abner Greenspan Treating Summer Mccolgan/Extender: Linwood Dibbles, HOYT Weeks in Treatment: 6 Visit Information History Since Last Visit All ordered tests and consults were completed: No Patient Arrived: Ambulatory Added or deleted any medications: No Arrival Time: 08:35 Any new allergies or adverse reactions: No Accompanied By: self Had a fall or experienced change in No Transfer Assistance: None activities of daily living that may affect Patient Identification Verified: Yes risk of falls: Secondary Verification Process Yes Signs or symptoms of abuse/neglect since last visito No Completed: Hospitalized since last visit: No Patient Requires Transmission-Based No Implantable device outside of the clinic excluding No Precautions: cellular tissue based products placed in the center Patient Has Alerts: Yes since last visit: Patient Alerts: Patient on Blood Has Dressing in Place as Prescribed: Yes Thinner Pain Present Now: No Eliquis Electronic Signature(s) Signed: 01/14/2018 5:18:34 PM By: Alejandro Mulling Entered By: Alejandro Mulling on 01/14/2018 08:35:49 Farra, Jomarie Longs ([id number]) -------------------------------------------------------------------------------- Clinic Level of Care Assessment Details Patient Name: [name] Date of Service: 01/14/2018 8:30 AM Medical Record Number: [id number] Patient Account Number: [id number] Date of Birth/Sex: 1981-01-05 (37 y.o. M) Treating RN: Renne Crigler Primary Care Hoby Kawai: Abner Greenspan Other Clinician: Referring Dandra Velardi: Abner Greenspan Treating Laria Grimmett/Extender: Linwood Dibbles, HOYT Weeks in  Treatment: 6 Clinic Level of Care Assessment Items TOOL 4 Quantity Score X - Use when only an EandM is performed on FOLLOW-UP visit 1 0 ASSESSMENTS - Nursing Assessment / Reassessment X - Reassessment of Co-morbidities (includes updates in patient status) 1 10 X- 1 5 Reassessment of Adherence to Treatment Plan ASSESSMENTS - Wound and Skin Assessment / Reassessment X - Simple Wound Assessment / Reassessment - one wound 1 5 []  - 0 Complex Wound Assessment / Reassessment - multiple wounds []  - 0 Dermatologic / Skin Assessment (not related to wound area) ASSESSMENTS - Focused Assessment []  - Circumferential Edema Measurements - multi extremities 0 []  - 0 Nutritional Assessment / Counseling / Intervention []  - 0 Lower Extremity Assessment (monofilament, tuning fork, pulses) []  - 0 Peripheral Arterial Disease Assessment (using hand held doppler) ASSESSMENTS - Ostomy and/or Continence Assessment and Care []  - Incontinence Assessment and Management 0 []  - 0 Ostomy Care Assessment and Management (repouching, etc.) PROCESS - Coordination of Care X - Simple Patient / Family Education for ongoing care 1 15 []  - 0 Complex (extensive) Patient / Family Education for ongoing care []  - 0 Staff obtains Chiropractor, Records, Test Results / Process Orders []  - 0 Staff telephones HHA, Nursing Homes / Clarify orders / etc []  - 0 Routine Transfer to another Facility (non-emergent condition) []  - 0 Routine Hospital Admission (non-emergent condition) []  - 0 New Admissions / Manufacturing engineer / Ordering NPWT, Apligraf, etc. []  - 0 Emergency Hospital Admission (emergent condition) X- 1 10 Simple Discharge Coordination Douglas, Joziyah ([id number]) []  - 0 Complex (extensive) Discharge Coordination PROCESS - Special Needs []  - Pediatric / Minor Patient Management 0 []  - 0 Isolation Patient Management []  - 0 Hearing / Language / Visual special needs []  - 0 Assessment of Community assistance  (transportation, D/C planning, etc.) []  - 0 Additional assistance / Altered mentation []  - 0 Support Surface(s) Assessment (bed, cushion, seat, etc.) INTERVENTIONS - Wound Cleansing / Measurement X - Simple Wound  Cleansing - one wound 1 5 []  - 0 Complex Wound Cleansing - multiple wounds X- 1 5 Wound Imaging (photographs - any number of wounds) []  - 0 Wound Tracing (instead of photographs) X- 1 5 Simple Wound Measurement - one wound []  - 0 Complex Wound Measurement - multiple wounds INTERVENTIONS - Wound Dressings X - Small Wound Dressing one or multiple wounds 1 10 []  - 0 Medium Wound Dressing one or multiple wounds []  - 0 Large Wound Dressing one or multiple wounds []  - 0 Application of Medications - topical []  - 0 Application of Medications - injection INTERVENTIONS - Miscellaneous []  - External ear exam 0 []  - 0 Specimen Collection (cultures, biopsies, blood, body fluids, etc.) []  - 0 Specimen(s) / Culture(s) sent or taken to Lab for analysis []  - 0 Patient Transfer (multiple staff / Nurse, adultHoyer Lift / Similar devices) []  - 0 Simple Staple / Suture removal (25 or less) []  - 0 Complex Staple / Suture removal (26 or more) []  - 0 Hypo / Hyperglycemic Management (close monitor of Blood Glucose) []  - 0 Ankle / Brachial Index (ABI) - do not check if billed separately X- 1 5 Vital Signs Leavell, Bralyn ([id number]) Has the patient been seen at the hospital within the last three years: Yes Total Score: 75 Level Of Care: New/Established - Level 2 Electronic Signature(s) Signed: 01/14/2018 5:11:24 PM By: Renne CriglerFlinchum, Cheryl Entered By: Renne CriglerFlinchum, Cheryl on 01/14/2018 08:51:01 Cedar GroveWEST, Jomarie LongsJOSEPH ([id number]) -------------------------------------------------------------------------------- Encounter Discharge Information Details Patient Name: Nils FlackWEST, Maxwel Date of Service: 01/14/2018 8:30 AM Medical Record Number: [id number] Patient Account Number: [id number] Date of Birth/Sex: February 07, 1981 (37  y.o. M) Treating RN: Phillis HaggisPinkerton, Debi Primary Care Armstead Heiland: Abner GreenspanHODGES, BETH Other Clinician: Referring Destin Kittler: Abner GreenspanHODGES, BETH Treating Gianno Volner/Extender: Linwood DibblesSTONE III, HOYT Weeks in Treatment: 6 Encounter Discharge Information Items Discharge Condition: Stable Ambulatory Status: Ambulatory Discharge Destination: Skilled Nursing Facility Telephoned: No Orders Sent: Yes Transportation: Other Accompanied By: self Schedule Follow-up Appointment: Yes Clinical Summary of Care: Provided Form Type Recipient Paper Patient jw Electronic Signature(s) Signed: 01/14/2018 9:07:30 AM By: Dayton MartesWallace, RCP,RRT,CHT, Sallie RCP, RRT, CHT Previous Signature: 01/14/2018 9:06:02 AM Version By: Alejandro MullingPinkerton, Debra Entered By: Dayton MartesWallace, RCP,RRT,CHT, Sallie on 01/14/2018 09:07:30 Bertoni, [place] Midd[place]ownJOSE[place] ([id number]) -------------------------------------------------------------------------------- Lower Extremity Assessment Details Patient Name: Nils FlackWEST, Kapil Date of Service: 01/14/2018 8:30 AM Medical Record Number: [id number] Patient Account Number: [id number] Date of Birth/Sex: February 07, 1981 (37 y.o. M) Treating RN: Phillis HaggisPinkerton, Debi Primary Care Santo Zahradnik: Abner GreenspanHODGES, BETH Other Clinician: Referring Vadim Centola: Abner GreenspanHODGES, BETH Treating Dylann Gallier/Extender: Linwood DibblesSTONE III, HOYT Weeks in Treatment: 6 Electronic Signature(s) Signed: 01/14/2018 5:18:34 PM By: Alejandro MullingPinkerton, Debra Entered By: Alejandro MullingPinkerton, Debra on 01/14/2018 08:46:31 [place], [place]JO[place] ([id number]) -------------------------------------------------------------------------------- Multi Wound Chart Details Patient Name: Nils FlackWEST, Tron Date of Service: 01/14/2018 8:30 AM Medical Record Number: [id number] Patient Account Number: [id number] Date of Birth/Sex: February 07, 1981 (37 y.o. M) Treating RN: Renne CriglerFlinchum, Cheryl Primary Care Ixel Boehning: Abner GreenspanHODGES, BETH Other Clinician: Referring Cuauhtemoc Huegel: Abner GreenspanHODGES, BETH Treating Devarious Pavek/Extender: STONE III, HOYT Weeks in Treatment: 6 Vital Signs Height(in): 70 Pulse(bpm):  60 Weight(lbs): 245 Blood Pressure(mmHg): 114/63 Body Mass Index(BMI): 35 Temperature(F): 97.7 Respiratory Rate 16 (breaths/min): Photos: [1:No Photos] [3:No Photos] [N/A:N/A] Wound Location: [1:Right Upper Leg - Anterior] [3:Left Upper Leg - Medial] [N/A:N/A] Wounding Event: [1:Gradually Appeared] [3:Shear/Friction] [N/A:N/A] 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] [3:Skin Tear] [N/A:N/A] Comorbid History: [1:Anemia, Asthma, Hypertension, End Stage Renal Disease] [3:Anemia, Asthma, Hypertension, End Stage Renal Disease] [N/A:N/A] Date Acquired: [1:04/23/2017] [3:01/09/2018] [N/A:N/A] Weeks of Treatment: [1:6] [3:0] [N/A:N/A] Wound Status: [1:Open] [3:Open] [N/A:N/A] Measurements L x W x D [1:1.1x2.1x0.1] [3:0.7x1.7x0.1] [N/A:N/A] (cm)  Area (cm) : [1:1.814] [3:0.935] [N/A:N/A] Volume (cm) : [1:0.181] [3:0.093] [N/A:N/A] % Reduction in Area: [1:90.90%] [3:N/A] [N/A:N/A] % Reduction in Volume: [1:91.00%] [3:N/A] [N/A:N/A] Classification: [1:Full Thickness Without Exposed Support Structures] [3:Full Thickness Without Exposed Support Structures] [N/A:N/A] Exudate Amount: [1:Medium] [3:Medium] [N/A:N/A] Exudate Type: [1:Serosanguineous] [3:Serosanguineous] [N/A:N/A] Exudate Color: [1:red, brown] [3:red, brown] [N/A:N/A] Wound Margin: [1:Flat and Intact] [3:Flat and Intact] [N/A:N/A] Granulation Amount: [1:Large (67-100%)] [3:Large (67-100%)] [N/A:N/A] Granulation Quality: [1:Red] [3:Red] [N/A:N/A] Necrotic Amount: [1:Small (1-33%)] [3:Small (1-33%)] [N/A:N/A] Necrotic Tissue: [1:Eschar, Adherent Slough] [3:Adherent Slough] [N/A:N/A] Exposed Structures: [1:Fat Layer (Subcutaneous Tissue) Exposed: Yes Fascia: No Tendon: No Muscle: No Joint: No Bone: No] [3:Fascia: No Fat Layer (Subcutaneous Tissue) Exposed: No Tendon: No Muscle: No Joint: No Bone: No] [N/A:N/A] Epithelialization: [1:Small (1-33%)] [3:None] [N/A:N/A] Periwound Skin Texture: [1:Excoriation: No Induration: No Callus: No]  [3:No Abnormalities Noted] [N/A:N/A] Crepitus: No Rash: No Scarring: No Periwound Skin Moisture: Maceration: No No Abnormalities Noted N/A Dry/Scaly: No Periwound Skin Color: Hemosiderin Staining: Yes No Abnormalities Noted N/A Atrophie Blanche: No Cyanosis: No Ecchymosis: No Erythema: No Mottled: No Pallor: No Rubor: No Temperature: No Abnormality No Abnormality N/A Tenderness on Palpation: Yes Yes N/A Wound Preparation: Ulcer Cleansing: Ulcer Cleansing: N/A Rinsed/Irrigated with Saline Rinsed/Irrigated with Saline Topical Anesthetic Applied: Topical Anesthetic Applied: Other: lidocaine 4% Other: lidocaine 4% Treatment Notes Electronic Signature(s) Signed: 01/14/2018 5:11:24 PM By: Renne Crigler Entered By: Renne Crigler on 01/14/2018 08:48:33 Danzer, Jomarie Longs ([id number]) -------------------------------------------------------------------------------- Multi-Disciplinary Care Plan Details Patient Name: [name] Date of Service: 01/14/2018 8:30 AM Medical Record Number: [id number] Patient Account Number: [id number] Date of Birth/Sex: [birth year] (37 y.o. M) Treating RN: Renne Crigler Primary Care Jeremie Abdelaziz: Abner Greenspan Other Clinician: Referring Morena Mckissack: Abner Greenspan Treating Spirit Wernli/Extender: Linwood Dibbles, HOYT Weeks in Treatment: 6 Active Inactive ` Orientation to the Wound Care Program Nursing Diagnoses: Knowledge deficit related to the wound healing center program Goals: Patient/caregiver will verbalize understanding of the Wound Healing Center Program Date Initiated: 12/03/2017 Target Resolution Date: 12/24/2017 Goal Status: Active Interventions: Provide education on orientation to the wound center Notes: ` Wound/Skin Impairment Nursing Diagnoses: Impaired tissue integrity Goals: Patient/caregiver will verbalize understanding of skin care regimen Date Initiated: 12/03/2017 Target Resolution Date: 12/24/2017 Goal Status: Active Ulcer/skin breakdown will  have a volume reduction of 30% by week 4 Date Initiated: 12/03/2017 Target Resolution Date: 12/24/2017 Goal Status: Active Interventions: Assess patient/caregiver ability to obtain necessary supplies Assess patient/caregiver ability to perform ulcer/skin care regimen upon admission and as needed Assess ulceration(s) every visit Treatment Activities: Skin care regimen initiated : 12/03/2017 Notes: Electronic Signature(s) Signed: 01/14/2018 5:11:24 PM By: French Ana, Jomarie Longs ([id number]) Entered By: Renne Crigler on 01/14/2018 08:48:23 [name] ([id number]) -------------------------------------------------------------------------------- Pain Assessment Details Patient Name: [name] Date of Service: 01/14/2018 8:30 AM Medical Record Number: [id number] Patient Account Number: [id number] Date of Birth/Sex: [birth year] (37 y.o. M) Treating RN: Phillis Haggis Primary Care Nichola Warren: Abner Greenspan Other Clinician: Referring Margia Wiesen: Abner Greenspan Treating [place] Inthavong/Extender: STONE III, HOYT Weeks in Treatment: 6 Active Problems Location of Pain Severity and Description of Pain Patient Has Paino No Site Locations Pain Management and Medication Current Pain Management: Electronic Signature(s) Signed: 01/14/2018 5:18:34 PM By: Alejandro Mulling Entered By: Alejandro Mulling on 01/14/2018 08:35:55 Abello, Jomarie Longs ([id number]) -------------------------------------------------------------------------------- Patient/Caregiver Education Details Patient Name: [name] Date of Service: 01/14/2018 8:30 AM Medical Record Number: [id number] Patient Account Number: [id number] Date of Birth/Gender: Mar 16, 1981 (37 y.o. M) Treating RN: Phillis Haggis Primary Care Physician: Abner Greenspan Other Clinician: Referring Physician: Abner Greenspan Treating Physician/Extender: Larina Bras  III, HOYT Weeks in Treatment: 6 Education Assessment Education Provided To: Patient Education Topics  Provided Wound/Skin Impairment: Handouts: Caring for Your Ulcer, Other: change dressing as ordered Methods: Demonstration, Explain/Verbal Responses: State content correctly Electronic Signature(s) Signed: 01/14/2018 5:18:34 PM By: Alejandro Mulling Entered By: Alejandro Mulling on 01/14/2018 09:06:29 Sutherland, Jomarie Longs ([id number]) -------------------------------------------------------------------------------- Wound Assessment Details Patient Name: [name] Date of Service: 01/14/2018 8:30 AM Medical Record Number: [id number] Patient Account Number: [id number] Date of Birth/Sex: 1981/03/28 (37 y.o. M) Treating RN: Phillis Haggis Primary Care Trystyn Sitts: Abner Greenspan Other Clinician: Referring Darl Kuss: Abner Greenspan Treating Heber Hoog/Extender: STONE III, HOYT Weeks in Treatment: 6 Wound Status Wound Number: 1 Primary Calciphylaxis Etiology: Wound Location: Right Upper Leg - Anterior Wound Status: Open Wounding Event: Gradually Appeared Comorbid Anemia, Asthma, Hypertension, End Stage Date Acquired: 04/23/2017 History: Renal Disease Weeks Of Treatment: 6 Clustered Wound: No Photos Photo Uploaded By: Alejandro Mulling on 01/14/2018 17:17:08 Wound Measurements Length: (cm) 1.1 Width: (cm) 2.1 Depth: (cm) 0.1 Area: (cm) 1.814 Volume: (cm) 0.181 % Reduction in Area: 90.9% % Reduction in Volume: 91% Epithelialization: Small (1-33%) Tunneling: No Undermining: No Wound Description Full Thickness Without Exposed Support Classification: Structures Wound Margin: Flat and Intact Exudate Medium Amount: Exudate Type: Serosanguineous Exudate Color: red, brown Foul Odor After Cleansing: No Slough/Fibrino Yes Wound Bed Granulation Amount: Large (67-100%) Exposed Structure Granulation Quality: Red Fascia Exposed: No Necrotic Amount: Small (1-33%) Fat Layer (Subcutaneous Tissue) Exposed: Yes Necrotic Quality: Eschar, Adherent Slough Tendon Exposed: No Muscle Exposed: No Joint  Exposed: No Bone Exposed: No Periwound Skin Texture Texture Color Starry, Alyxander ([id number]) No Abnormalities Noted: No No Abnormalities Noted: No Callus: No Atrophie Blanche: No Crepitus: No Cyanosis: No Excoriation: No Ecchymosis: No Induration: No Erythema: No Rash: No Hemosiderin Staining: Yes Scarring: No Mottled: No Pallor: No Moisture Rubor: No No Abnormalities Noted: No Dry / Scaly: No Temperature / Pain Maceration: No Temperature: No Abnormality Tenderness on Palpation: Yes Wound Preparation Ulcer Cleansing: Rinsed/Irrigated with Saline Topical Anesthetic Applied: Other: lidocaine 4%, Treatment Notes Wound #1 (Right, Anterior Upper Leg) 1. Cleansed with: Clean wound with Normal Saline 2. Anesthetic Topical Lidocaine 4% cream to wound bed prior to debridement 3. Peri-wound Care: Skin Prep 4. Dressing Applied: Hydrafera Blue 5. Secondary Dressing Applied Bordered Foam Dressing Notes mepitel Electronic Signature(s) Signed: 01/14/2018 5:18:34 PM By: Alejandro Mulling Entered By: Alejandro Mulling on 01/14/2018 08:44:57 Appelhans, Arnie ([id number]) -------------------------------------------------------------------------------- Wound Assessment Details Patient Name: [name] Date of Service: 01/14/2018 8:30 AM Medical Record Number: [id number] Patient Account Number: [id number] Date of Birth/Sex: [birth year] (37 y.o. M) Treating RN: Phillis Haggis Primary Care Faydra Korman: Abner Greenspan Other Clinician: Referring Jenaya Saar: Abner Greenspan Treating Anil Havard/Extender: STONE III, HOYT Weeks in Treatment: 6 Wound Status Wound Number: 3 Primary Skin Tear Etiology: Wound Location: Left Upper Leg - Medial Wound Status: Open Wounding Event: Shear/Friction Comorbid Anemia, Asthma, Hypertension, End Stage Date Acquired: 01/09/2018 History: Renal Disease Weeks Of Treatment: 0 Clustered Wound: No Photos Photo Uploaded By: Alejandro Mulling on 01/14/2018 17:17:08 Wound  Measurements Length: (cm) 0.7 Width: (cm) 1.7 Depth: (cm) 0.1 Area: (cm) 0.935 Volume: (cm) 0.093 % Reduction in Area: % Reduction in Volume: Epithelialization: None Tunneling: No Undermining: No Wound Description Full Thickness Without Exposed Support Classification: Structures Wound Margin: Flat and Intact Exudate Medium Amount: Exudate Type: Serosanguineous Exudate Color: red, brown Foul Odor After Cleansing: No Slough/Fibrino Yes Wound Bed Granulation Amount: Large (67-100%) Exposed Structure Granulation Quality: Red Fascia Exposed: No Necrotic Amount: Small (1-33%) Fat Layer (Subcutaneous Tissue) Exposed: No  Necrotic Quality: Adherent Slough Tendon Exposed: No Muscle Exposed: No Joint Exposed: No Bone Exposed: No Periwound Skin Texture Texture Color Artiga, Dondre ([id number]) No Abnormalities Noted: No No Abnormalities Noted: No Moisture Temperature / Pain No Abnormalities Noted: No Temperature: No Abnormality Tenderness on Palpation: Yes Wound Preparation Ulcer Cleansing: Rinsed/Irrigated with Saline Topical Anesthetic Applied: Other: lidocaine 4%, Treatment Notes Wound #3 (Left, Medial Upper Leg) 1. Cleansed with: Clean wound with Normal Saline 2. Anesthetic Topical Lidocaine 4% cream to wound bed prior to debridement 3. Peri-wound Care: Skin Prep 4. Dressing Applied: Hydrafera Blue 5. Secondary Dressing Applied Bordered Foam Dressing Notes mepitel Electronic Signature(s) Signed: 01/14/2018 5:18:34 PM By: Alejandro Mulling Entered By: Alejandro Mulling on 01/14/2018 08:43:33 Riva, [place] ([id number]) -------------------------------------------------------------------------------- Vitals Details Patient Name: [name] Date of Service: 01/14/2018 8:30 AM Medical Record Number: [id number] Patient Account Number: [id number] Date of Birth/Sex: Jan 15, 1981 (37 y.o. M) Treating RN: Phillis Haggis Primary Care Brookelynne Dimperio: Abner Greenspan Other  Clinician: Referring Charlette Hennings: Abner Greenspan Treating Auren Valdes/Extender: STONE III, HOYT Weeks in Treatment: 6 Vital Signs Time Taken: 08:35 Temperature (F): 97.7 Height (in): 70 Pulse (bpm): 60 Weight (lbs): 245 Respiratory Rate (breaths/min): 16 Body Mass Index (BMI): 35.2 Blood Pressure (mmHg): 114/63 Reference Range: 80 - 120 mg / dl Electronic Signature(s) Signed: 01/14/2018 5:18:34 PM By: Alejandro Mulling Entered By: Alejandro Mulling on 01/14/2018 08:37:33

## 2018-01-28 ENCOUNTER — Ambulatory Visit: Payer: Medicaid Other | Admitting: Physician Assistant

## 2018-02-04 ENCOUNTER — Encounter: Payer: Medicaid Other | Admitting: Physician Assistant

## 2018-02-04 DIAGNOSIS — L97122 Non-pressure chronic ulcer of left thigh with fat layer exposed: Secondary | ICD-10-CM | POA: Diagnosis not present

## 2018-02-05 NOTE — Progress Notes (Signed)
LISTON, THUM ([id number]) Visit Report for 02/04/2018 Chief Complaint Document Details Patient Name: [name], [name] Date of Service: 02/04/2018 8:00 AM Medical Record Number: [id number] Patient Account Number: [id number] Date of Birth/Sex: 10/16/1980 (37 y.o. M) Treating RN: Roger Shelter Primary Care Provider: Marco Collie Other Clinician: Referring Provider: Marco Collie Treating Provider/Extender: Melburn Hake, HOYT Weeks in Treatment: 9 Information Obtained from: Patient Chief Complaint Bilateral thigh Calciphylaxis Ulcers Electronic Signature(s) Signed: 02/04/2018 11:25:57 PM By: Worthy Keeler PA-C Entered By: Worthy Keeler on 02/04/2018 08:12:04 TYCE, DELCID ([id number]) -------------------------------------------------------------------------------- HPI Details Patient Name: [name] Date of Service: 02/04/2018 8:00 AM Medical Record Number: [id number] Patient Account Number: [id number] Date of Birth/Sex: May 08, 1981 (37 y.o. 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 He has currently been undergoing treatment according to notes reviewed from 11/28/17 for this. 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 Subsequently patient was using Santyl for very long time but is no longer using that at this point. He is receiving sodium thiosulfate during hemodialysis along with Sevelamer to help control his phosphorus levels. Currently a steroid cream was recommended for the wounds although he has not used that yet. He a has what appears to be extremely hyper granular and poorly  attached granulation noted over the bed of the wound. This is true of both wounds of lower extremities. He does show some signs of epithelialization around the border although this has been very slow according to the patient. Fortunately he is not having significant pain at this point. He does have a history of hypertension as well is congestive heart failure. Otherwise he states the wounds are much better than when I first started. 12/10/17 on evaluation today patient actually presents with signs of improvement in regard to his bilateral thigh ulcer. He has been tolerating the dressing changes without complication which is good news. With that being said is not really having a significant amount of bleeding at this point today is excellent. He has been pleased with the progress as well. There's no evidence of infection. 12/19/17; patient normally followed by Jeri Cos on Mondays. In his absence he comes in to see me today. 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 Initially painful necrotic wounds that have been debrided to a healthy surface. He is using Hydrofera Blue. He has one on the right anterior thigh and one on the left 12/24/17 on evaluation today patient's wounds actually appear to be doing excellent in regard to the bilateral upper thighs. He has been tolerating the dressing changes without complication and overall he seems to be making great progress especially on the left. He has no pain. 12/31/17 on evaluation today patient appears to be doing excellent in regard to his bilateral lower extremity ulcers. The left in fact appears to be completely healed which is great news the right is smaller and doing very well. Overall I'm pleased with the progress he's made the patient is very happy. 01/14/18 on evaluation today patient's ulcer on the right thigh actually appears to be doing much better. It's getting smaller. Unfortunately the left thigh ulcer reopen  due to the dressing having gotten stuck to the wound bed. Subsequently  this pulled off the top layer of skin and he does have an open wound at the site yet again. With that being said there does not appear to be evidence of infection overall things have been going well. 02/04/18 on evaluation today patient's ulcers on his thighs actually appear to be doing fairly well at this point. There does not appear to be any evidence of infection and actually appear to be close today. Unfortunately he has a new wound on the left lateral abdominal region he also has an area of what appears to be superficial bruising noted on the right lateral abdominal region. With that being said I cannot tell for sure that this is actually a Calciphylaxis issue or not at this point it could be but it also may just be the normal bruising/irritation due to his close rubbing. He states this is always been an area that does rub significantly. That's part of the reason why he's looking at the surgery that he's actually scheduled for reevaluation four on 02/12/18. He thought this was sooner when I met with him last but this is actually when the appointment is. Electronic Signature(s) Signed: 02/04/2018 11:25:57 PM By: Worthy Keeler PA-C Entered By: Worthy Keeler on 02/04/2018 08:23:48 UGONNA, KEIRSEY ([id number]) -------------------------------------------------------------------------------- Physical Exam Details Patient Name: [name] Date of Service: 02/04/2018 8:00 AM Medical Record Number: [id number] Patient Account Number: [id number] Date of Birth/Sex: [birth year] (37 y.o. M) Treating RN: Roger Shelter Primary Care Provider: Marco Collie Other Clinician: Referring Provider: Marco Collie Treating Provider/Extender: STONE III, HOYT Weeks in Treatment: 9 Constitutional Obese and well-hydrated in no acute distress. Respiratory normal breathing without difficulty. clear to auscultation  bilaterally. Cardiovascular regular rate and rhythm with normal S1, S2. Psychiatric this patient is able to make decisions and demonstrates good insight into disease process. Alert and Oriented x 3. pleasant and cooperative. Notes Patient's wounds at this point appear to be again healed in regard to the ones we've been treating I do think there still some underlying healing now that it's completely covered but nonetheless overall I do believe this is showing signs of great improvement. In general I'm pleased with the progress that is made over the weeks and months that I have been taking care of him. In regard to the new areas again I cannot be sure this is not a Calciphylaxis issue although I'm also not 100% sure that it is yet either. We will have to see. Electronic Signature(s) Signed: 02/04/2018 11:25:57 PM By: Worthy Keeler PA-C Entered By: Worthy Keeler on 02/04/2018 08:24:48 KODY, BRANDL ([id number]) -------------------------------------------------------------------------------- Physician Orders Details Patient Name: [name] Date of Service: 02/04/2018 8:00 AM Medical Record Number: [id number] Patient Account Number: [id number] Date of Birth/Sex: 28-Jan-1981 (37 y.o. M) Treating RN: Roger Shelter Primary Care Provider: Marco Collie Other Clinician: Referring Provider: Marco Collie Treating Provider/Extender: STONE III, HOYT Weeks in Treatment: 9 Verbal / Phone Orders: No Diagnosis Coding Wound Cleansing Wound #1 Right,Anterior Upper Leg o Clean wound with Normal Saline. o Clean wound with Normal Saline. Wound #4 Left,Lateral Abdomen - Lower Quadrant o Clean wound with Normal Saline. o Clean wound with Normal Saline. Anesthetic (add to Medication List) Wound #1 Right,Anterior Upper Leg o Topical Lidocaine 4% cream applied to wound bed prior to debridement (In Clinic Only). o Topical Lidocaine 4% cream applied to wound bed prior to debridement (In Clinic  Only). Wound #4 Left,Lateral Abdomen - Lower Quadrant o Topical Lidocaine 4% cream applied to wound bed prior to  debridement (In Clinic Only). o Topical Lidocaine 4% cream applied to wound bed prior to debridement (In Clinic Only). Skin Barriers/Peri-Wound Care Wound #1 Right,Anterior Upper Leg o Skin Prep Wound #4 Left,Lateral Abdomen - Lower Quadrant o Skin Prep Primary Wound Dressing Wound #4 Left,Lateral Abdomen - Lower Quadrant o Xeroform Secondary Dressing Wound #1 Right,Anterior Upper Leg o Boardered Foam Dressing Wound #3 Left,Medial Upper Leg o Boardered Foam Dressing Wound #4 Left,Lateral Abdomen - Lower Quadrant o Boardered Foam Dressing Dressing Change Frequency Wound #4 Left,Lateral Abdomen - Lower Quadrant o Change dressing every other day. LEEANDRE, NORDLING ([id number]) Follow-up Appointments Wound #1 Right,Anterior Upper Leg o Return Appointment in 2 weeks. Electronic Signature(s) Signed: 02/04/2018 4:53:36 PM By: Roger Shelter Signed: 02/04/2018 11:25:57 PM By: Worthy Keeler PA-C Entered By: Roger Shelter on 02/04/2018 08:20:21 BINYAMIN, NELIS ([id number]) -------------------------------------------------------------------------------- Problem List Details Patient Name: [name] Date of Service: 02/04/2018 8:00 AM Medical Record Number: [id number] Patient Account Number: [id number] Date of Birth/Sex: June 05, 1981 (37 y.o. 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 12/03/2017 No Yes L97.112 Non-pressure chronic ulcer of right thigh with fat layer 12/03/2017 No Yes exposed [place] (primary) hypertension 12/03/2017 No Yes I50.33 Acute on chronic diastolic (congestive) heart failure 12/03/2017 No Yes Inactive Problems Resolved Problems Electronic Signature(s) Signed: 02/04/2018 11:25:57 PM By: Worthy Keeler PA-C Entered By: Worthy Keeler on 02/04/2018 08:11:34 Beynon, Elman ([id number]) -------------------------------------------------------------------------------- Progress Note Details Patient Name: [name] Date of Service: 02/04/2018 8:00 AM Medical Record Number: [id number] Patient Account Number: [id number] Date of Birth/Sex: [birth year] (37 y.o. M) Treating RN: Roger Shelter Primary Care Provider: Marco Collie Other Clinician: Referring Provider: Marco Collie Treating Provider/Extender: Melburn Hake, HOYT Weeks in Treatment: 9 Subjective Chief Complaint Information obtained from Patient Bilateral thigh Calciphylaxis Ulcers History of Present Illness (HPI) 12/03/17 on evaluation today patient presents for initial evaluation and the wound center concerning issues he has been having with his bilateral lower extremities with Calciphylaxis. He has currently been undergoing treatment according to notes reviewed from 11/28/17 for this. 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 Subsequently patient was using Santyl for very long time but is no longer using that at this point. He is receiving sodium thiosulfate during hemodialysis along with Sevelamer to help control his phosphorus levels. Currently a steroid cream was recommended for the wounds although he has not used that yet. He a has what appears to be extremely hyper granular and poorly attached granulation noted over the bed of the wound. This is true of both wounds of lower extremities. He does show some signs of epithelialization around the border although this has been very slow according to the patient. Fortunately he is not having significant pain at  this point. He does have a history of hypertension as well is congestive heart failure. Otherwise he states the wounds are much better than when I first started. 12/10/17 on evaluation today patient actually presents with signs of improvement in regard to his bilateral thigh ulcer. He has been tolerating the dressing changes without complication which is good news. With that being said is not really having a significant amount of bleeding at this point today is excellent. He has  been pleased with the progress as well. There's no evidence of infection. 12/19/17; patient normally followed by Jeri Cos on Mondays. In his absence he comes in to see me today. 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 Initially painful necrotic wounds that have been debrided to a healthy surface. He is using Hydrofera Blue. He has one on the right anterior thigh and one on the left 12/24/17 on evaluation today patient's wounds actually appear to be doing excellent in regard to the bilateral upper thighs. He has been tolerating the dressing changes without complication and overall he seems to be making great progress especially on the left. He has no pain. 12/31/17 on evaluation today patient appears to be doing excellent in regard to his bilateral lower extremity ulcers. The left in fact appears to be completely healed which is great news the right is smaller and doing very well. Overall I'm pleased with the progress he's made the patient is very happy. 01/14/18 on evaluation today patient's ulcer on the right thigh actually appears to be doing much better. It's getting smaller. Unfortunately the left thigh ulcer reopen due to the dressing having gotten stuck to the wound bed. Subsequently this pulled off the top layer of skin and he does have an open wound at the site yet again. With that being said there does not appear to be evidence of infection overall things have been going  well. 02/04/18 on evaluation today patient's ulcers on his thighs actually appear to be doing fairly well at this point. There does not appear to be any evidence of infection and actually appear to be close today. Unfortunately he has a new wound on the left lateral abdominal region he also has an area of what appears to be superficial bruising noted on the right lateral abdominal region. With that being said I cannot tell for sure that this is actually a Calciphylaxis issue or not at this point it could be but it also may just be the normal bruising/irritation due to his close rubbing. He states this is always been an area that does rub significantly. That's part of the reason why he's looking at the surgery that he's actually scheduled for reevaluation four on 02/12/18. He thought this was sooner when I met with him last but this is actually when the appointment is. SABIAN, KUBA ([id number]) Patient History Information obtained from Patient. Family History Diabetes - Maternal Grandparents, Heart Disease - Maternal Grandparents, Hypertension - Maternal Grandparents, Stroke - Maternal Grandparents, No family history of Cancer, Hereditary Spherocytosis, Kidney Disease, Lung Disease, Seizures, Thyroid Problems, Tuberculosis. Social History Never smoker, Marital Status - Single, Alcohol Use - Never, Drug Use - No History, Caffeine Use - Daily. Review of Systems (ROS) Constitutional Symptoms (General Health) Denies complaints or symptoms of Fever, Chills. Respiratory The patient has no complaints or symptoms. Cardiovascular The patient has no complaints or symptoms. Psychiatric The patient has no complaints or symptoms. Objective Constitutional Obese and well-hydrated in no acute distress. Vitals Time Taken: 8:01 AM, Height: 70 in, Weight: 245 lbs, BMI: 35.2, Temperature: 98.1 F, Pulse: 67 bpm, Respiratory Rate: 16 breaths/min, Blood Pressure: 103/68 mmHg. Respiratory normal breathing  without difficulty. clear to auscultation bilaterally. Cardiovascular regular rate and rhythm with normal S1, S2. Psychiatric this patient is able to make decisions and demonstrates good insight into disease process. Alert and Oriented x 3. pleasant and cooperative. General Notes: Patient's wounds at this point appear to be again healed in regard to the  ones we've been treating I do think there still some underlying healing now that it's completely covered but nonetheless overall I do believe this is showing signs of great improvement. In general I'm pleased with the progress that is made over the weeks and months that I have been taking care of him. In regard to the new areas again I cannot be sure this is not a Calciphylaxis issue although I'm also not 100% sure that it is yet either. We will have to see. Integumentary (Hair, Skin) Wound #1 status is Open. Original cause of wound was Gradually Appeared. The wound is located on the Right,Anterior Upper Leg. The wound measures 0cm length x 0cm width x 0cm depth; 0cm^2 area and 0cm^3 volume. [place], [place] ([id number]) Wound #3 status is Healed - Epithelialized. Original cause of wound was Shear/Friction. The wound is located on the Left,Medial Upper Leg. The wound measures 0cm length x 0cm width x 0cm depth; 0cm^2 area and 0cm^3 volume. There is no tunneling or undermining noted. There is a medium amount of serosanguineous drainage noted. The wound margin is flat and intact. There is large (67-100%) red granulation within the wound bed. There is a small (1-33%) amount of necrotic tissue within the wound bed including Adherent Slough. Periwound temperature was noted as No Abnormality. The periwound has tenderness on palpation. Wound #4 status is Open. Original cause of wound was Gradually Appeared. The wound is located on the Left,Lateral Abdomen - Lower Quadrant. The wound measures 0.6cm length x 0.9cm width x 0.1cm depth; 0.424cm^2 area  and 0.042cm^3 volume. 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 Clean wound with Normal Saline. Wound #4 Left,Lateral Abdomen - Lower Quadrant: Clean wound with Normal Saline. Clean wound with Normal Saline. Anesthetic (add to Medication List): Wound #1 Right,Anterior Upper Leg: Topical Lidocaine 4% cream applied to wound bed prior to debridement (In Clinic Only). Topical Lidocaine 4% cream applied to wound bed prior to debridement (In Clinic Only). Wound #4 Left,Lateral Abdomen - Lower Quadrant: Topical Lidocaine 4% cream applied to wound bed prior to debridement (In Clinic Only). Topical Lidocaine 4% cream applied to wound bed prior to debridement (In Clinic Only). Skin Barriers/Peri-Wound Care: Wound #1 Right,Anterior Upper Leg: Skin Prep Wound #4 Left,Lateral Abdomen - Lower Quadrant: Skin Prep Primary Wound Dressing: Wound #4 Left,Lateral Abdomen - Lower Quadrant: Xeroform Secondary Dressing: Wound #1 Right,Anterior Upper Leg: Boardered Foam Dressing Avis, Gustabo ([id number]) Wound #3 Left,Medial Upper Leg: Boardered Foam Dressing Wound #4 Left,Lateral Abdomen - Lower Quadrant: Boardered Foam Dressing Dressing Change Frequency: Wound #4 Left,Lateral Abdomen - Lower Quadrant: Change dressing every other day. Follow-up Appointments: Wound #1 Right,Anterior Upper Leg: Return Appointment in 2 weeks. I explained to the patient that in general it will take some time before the areas on the lateral demo regions will completely show themselves if indeed they are Calciphylaxis if they are will treat them accordingly but at this point in time I'm  not 100% sure this is not just a shear/friction injury. We will see were things stand two weeks time and will probably know more at that point. Anything changes in the meantime he will contact the office to let us know. Please see above for specific wound care orders. We will  see patient for re-evaluation in 2 week(s) here in the clinic. If anything worsens or changes patient will contact our office for additional recommendations. Electronic Signature(s) Signed: 02/04/2018 11:25:57 PM By: Worthy Keeler PA-C Entered By: Worthy Keeler on 02/04/2018 08:25:29 KAIREN, HALLINAN ([id number]) -------------------------------------------------------------------------------- ROS/PFSH Details Patient Name: [name] Date of Service: 02/04/2018 8:00 AM Medical Record Number: [id number] Patient Account Number: [id number] Date of Birth/Sex: 21-Dec-1980 (37 y.o. M) Treating RN: Roger Shelter Primary Care Provider: Marco Collie Other Clinician: Referring Provider: Marco Collie Treating Provider/Extender: STONE III, HOYT Weeks in Treatment: 9 Information Obtained From Patient Wound History Do you currently have one or more open woundso Yes How many open wounds do you currently haveo 2 Approximately how long have you had your woundso 6 months How have you been treating your wound(s) until nowo triamcinalone Has your wound(s) ever healed and then re-openedo No Have you had any lab work done in the past montho No Have you tested positive for an antibiotic resistant organism (MRSA, VRE)o No Have you tested positive for osteomyelitis (bone infection)o No Have you had any tests for circulation on your legso No Constitutional Symptoms (General Health) Complaints and Symptoms: Negative for: Fever; Chills Hematologic/Lymphatic Medical History: Positive for: Anemia Negative for: Hemophilia; Human Immunodeficiency Virus; Lymphedema; Sickle Cell Disease Respiratory Complaints and Symptoms: No Complaints  or Symptoms Medical History: Positive for: Asthma - history Negative for: Aspiration; Chronic Obstructive Pulmonary Disease (COPD); Pneumothorax; Sleep Apnea; Tuberculosis Cardiovascular Complaints and Symptoms: No Complaints or Symptoms Medical History: Positive for: Hypertension Gastrointestinal Medical History: Negative for: Cirrhosis ; Colitis; Crohnos; Hepatitis A; Hepatitis B; Hepatitis C Genitourinary [place], [place] ([id number]) Medical History: Positive for: End Stage Renal Disease Immunological Medical History: Negative for: Lupus Erythematosus; Raynaudos; Scleroderma Musculoskeletal Medical History: Negative for: Gout; Rheumatoid Arthritis; Osteoarthritis; Osteomyelitis Neurologic Medical History: Negative for: Dementia; Neuropathy Oncologic Medical History: Negative for: Received Chemotherapy; Received Radiation Psychiatric Complaints and Symptoms: No Complaints or Symptoms Immunizations Pneumococcal Vaccine: Received Pneumococcal Vaccination: Yes Implantable Devices Family and Social History Cancer: No; Diabetes: Yes - Maternal Grandparents; Heart Disease: Yes - Maternal Grandparents; Hereditary Spherocytosis: No; Hypertension: Yes - Maternal Grandparents; Kidney Disease: No; Lung Disease: No; Seizures: No; Stroke: Yes - Maternal Grandparents; Thyroid Problems: No; Tuberculosis: No; Never smoker; Marital Status - Single; Alcohol Use: Never; Drug Use: No History; Caffeine Use: Daily; Advanced Directives: No; Patient does not want information on Advanced Directives Physician Affirmation I have reviewed and agree with the above information. Electronic Signature(s) Signed: 02/04/2018 4:53:36 PM By: Roger Shelter Signed: 02/04/2018 11:25:57 PM By: Worthy Keeler PA-C Entered By: Worthy Keeler on 02/04/2018 08:24:24 CORDAE, MCCAREY ([id number]) -------------------------------------------------------------------------------- SuperBill Details Patient Name: [name] Date of Service: 02/04/2018 Medical Record Number: [id number] Patient Account Number: [id number] Date of Birth/Sex: 1980-09-06 (37 y.o. 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 L97.112 Non-pressure chronic ulcer of right thigh with fat layer exposed I10 Essential (primary) hypertension I50.33 Acute on chronic diastolic (congestive) heart failure Facility Procedures CPT4 Code: 50037048 Description: 88916 - WOUND CARE VISIT-LEV 2 EST PT Modifier: Quantity: 1 Physician Procedures CPT4 Code: 9450388 Description: 99213 - WC PHYS LEVEL 3 - EST PT ICD-10 Diagnosis Description 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

## 2018-02-05 NOTE — Progress Notes (Signed)
[name], [name] ([id number]) Visit Report for 02/04/2018 Arrival Information Details Patient Name: [name], [name] Date of Service: 02/04/2018 8:00 AM Medical Record Number: [id number] Patient Account Number: [id number] Date of Birth/Sex: [birth year] (37 y.o. M) Treating RN: [name] Primary Care [name]: [name] Other Clinician: Referring [name]: [name] Treating [name]/Extender: [name], [name] in Treatment: 9 Visit Information History Since Last Visit Added or deleted any medications: No Patient Arrived: Ambulatory Any new allergies or adverse reactions: No Arrival Time: 08:00 Had a fall or experienced change in No Accompanied By: self activities of daily living that may affect Transfer Assistance: None risk of falls: Patient Identification Verified: Yes Signs or symptoms of abuse/neglect since last visito No Secondary Verification Process Yes Hospitalized since last visit: No Completed: Implantable device outside of the clinic excluding No Patient Requires Transmission-Based No cellular tissue based products placed in the center Precautions: since last visit: Patient Has Alerts: Yes Has Dressing in Place as Prescribed: Yes Patient Alerts: Patient on Blood Pain Present Now: No Thinner Eliquis Electronic Signature(s) Signed: 02/04/2018 4:53:29 PM By: [name] Entered By: [name] on 02/04/2018 08:00:28 [name] ([id number]) -------------------------------------------------------------------------------- Clinic Level of Care Assessment Details Patient Name: [name] Date of Service: 02/04/2018 8:00 AM Medical Record Number: [id number] Patient Account Number: [id number] Date of Birth/Sex: 1980/09/21 (37 y.o. M) Treating RN: [name] Primary Care Dynesha Woolen: [name] Other Clinician: Referring [name]: [name] Treating [name]/Extender: [name], [name] in Treatment: 9 Clinic Level of Care Assessment Items TOOL 4  Quantity Score X - Use when only an EandM is performed on FOLLOW-UP visit 1 0 ASSESSMENTS - Nursing Assessment / Reassessment X - Reassessment of Co-morbidities (includes updates in patient status) 1 10 X- 1 5 Reassessment of Adherence to Treatment Plan ASSESSMENTS - Wound and Skin Assessment / Reassessment X - Simple Wound Assessment / Reassessment - one wound 1 5 []  - 0 Complex Wound Assessment / Reassessment - multiple wounds []  - 0 Dermatologic / Skin Assessment (not related to wound area) ASSESSMENTS - Focused Assessment []  - Circumferential Edema Measurements - multi extremities 0 []  - 0 Nutritional Assessment / Counseling / Intervention []  - 0 Lower Extremity Assessment (monofilament, tuning fork, pulses) []  - 0 Peripheral Arterial Disease Assessment (using hand held doppler) ASSESSMENTS - Ostomy and/or Continence Assessment and Care []  - Incontinence Assessment and Management 0 []  - 0 Ostomy Care Assessment and Management (repouching, etc.) PROCESS - Coordination of Care X - Simple Patient / Family Education for ongoing care 1 15 []  - 0 Complex (extensive) Patient / Family Education for ongoing care []  - 0 Staff obtains Chiropractor, Records, Test Results / Process Orders []  - 0 Staff telephones HHA, Nursing Homes / Clarify orders / etc []  - 0 Routine Transfer to another Facility (non-emergent condition) []  - 0 Routine Hospital Admission (non-emergent condition) []  - 0 New Admissions / Manufacturing engineer / Ordering NPWT, Apligraf, etc. []  - 0 Emergency Hospital Admission (emergent condition) X- 1 10 Simple Discharge Coordination [name] ([id number]) []  - 0 Complex (extensive) Discharge Coordination PROCESS - Special Needs []  - Pediatric / Minor Patient Management 0 []  - 0 Isolation Patient Management []  - 0 Hearing / Language / Visual special needs []  - 0 Assessment of Community assistance (transportation, D/C planning, etc.) []  - 0 Additional  assistance / Altered mentation []  - 0 Support Surface(s) Assessment (bed, cushion, seat, etc.) INTERVENTIONS - Wound Cleansing / Measurement X - Simple Wound Cleansing - one wound 1 5 []  -  0 Complex Wound Cleansing - multiple wounds X- 1 5 Wound Imaging (photographs - any number of wounds) []  - 0 Wound Tracing (instead of photographs) X- 1 5 Simple Wound Measurement - one wound []  - 0 Complex Wound Measurement - multiple wounds INTERVENTIONS - Wound Dressings X - Small Wound Dressing one or multiple wounds 1 10 []  - 0 Medium Wound Dressing one or multiple wounds []  - 0 Large Wound Dressing one or multiple wounds []  - 0 Application of Medications - topical []  - 0 Application of Medications - injection INTERVENTIONS - Miscellaneous []  - External ear exam 0 []  - 0 Specimen Collection (cultures, biopsies, blood, body fluids, etc.) []  - 0 Specimen(s) / Culture(s) sent or taken to Lab for analysis []  - 0 Patient Transfer (multiple staff / Nurse, adultHoyer Lift / Similar devices) []  - 0 Simple Staple / Suture removal (25 or less) []  - 0 Complex Staple / Suture removal (26 or more) []  - 0 Hypo / Hyperglycemic Management (close monitor of Blood Glucose) []  - 0 Ankle / Brachial Index (ABI) - do not check if billed separately X- 1 5 Vital Signs [name] ([id number]) Has the patient been seen at the hospital within the last three years: Yes Total Score: 75 Level Of Care: New/Established - Level 2 Electronic Signature(s) Signed: 02/04/2018 4:53:36 PM By: Brian CriglerFlinchum, Cheryl Entered By: Brian CriglerFlinchum, Cheryl on 02/04/2018 08:21:14 [name] ([id number]) -------------------------------------------------------------------------------- Encounter Discharge Information Details Patient Name: [name] Date of Service: 02/04/2018 8:00 AM Medical Record Number: [id number] Patient Account Number: [id number] Date of Birth/Sex: 1981/07/23 (37 y.o. M) Treating RN: Brian Sitesorthy, Joanna Primary Care  [name]: Brian GreenspanHODGES, [name] Other Clinician: Referring Farhad Burleson: Brian GreenspanHODGES, [name] Treating Lenita Peregrina/Extender: Brian DibblesSTONE III, [name] in Treatment: 9 Encounter Discharge Information Items Discharge Condition: Stable Ambulatory Status: Ambulatory Discharge Destination: Home Transportation: Private Auto Accompanied By: self Schedule Follow-up Appointment: Yes Clinical Summary of Care: Electronic Signature(s) Signed: 02/04/2018 9:03:17 AM By: Brian Sitesorthy, Joanna Entered By: Brian Sitesorthy, Joanna on 02/04/2018 09:03:17 Laffey, Jomarie LongsJOSEPH ([id number]) -------------------------------------------------------------------------------- Lower Extremity Assessment Details Patient Name: Brian FlackWEST, Lason Date of Service: 02/04/2018 8:00 AM Medical Record Number: [id number] Patient Account Number: [id number] Date of Birth/Sex: 1981/07/23 (37 y.o. M) Treating RN: Brian Sitesorthy, Joanna Primary Care Kamari Buch: Brian GreenspanHODGES, [name] Other Clinician: Referring Yeiren Whitecotton: Brian GreenspanHODGES, [name] Treating Deanglo Hissong/Extender: Brian DibblesSTONE III, [name] in Treatment: 9 Electronic Signature(s) Signed: 02/04/2018 4:53:29 PM By: Brian Sitesorthy, Joanna Entered By: Brian Sitesorthy, Joanna on 02/04/2018 08:08:18 Brian FlackWEST, Courtney ([id number]) -------------------------------------------------------------------------------- Multi Wound Chart Details Patient Name: Brian FlackWEST, Alaric Date of Service: 02/04/2018 8:00 AM Medical Record Number: [id number] Patient Account Number: [id number] Date of Birth/Sex: 1981/07/23 (37 y.o. M) Treating RN: Brian CriglerFlinchum, Cheryl Primary Care Alinah Sheard: Brian GreenspanHODGES, [name] Other Clinician: Referring Dean Goldner: Brian GreenspanHODGES, [name] Treating Jaymir Struble/Extender: STONE III, [name] in Treatment: 9 Vital Signs Height(in): 70 Pulse(bpm): 67 Weight(lbs): 245 Blood Pressure(mmHg): 103/68 Body Mass Index(BMI): 35 Temperature(F): 98.1 Respiratory Rate 16 (breaths/min): Photos: [1:No Photos] [3:No Photos] [4:No Photos] Wound Location: [1:Right, Anterior Upper Leg] [3:Left, Medial Upper Leg]  [4:Left, Lateral Abdomen - Lower Quadrant] Wounding Event: [1:Gradually Appeared] [3:Shear/Friction] [4:Gradually Appeared] 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] [3:Skin Tear] [4:Calciphylaxis] Date Acquired: [1:04/23/2017] [3:01/09/2018] [4:02/04/2018] [name] of Treatment: [1:9] [3:3] [4:0] Wound Status: [1:Open] [3:Open] [4:Open] Measurements L x W x D [1:0.1x0.1x0.1] [3:0.1x0.1x0.1] [4:0.6x0.9x0.1] (cm) Area (cm) : [1:0.008] [3:0.008] [4:0.424] Volume (cm) : [1:0.001] [3:0.001] [4:0.042] % Reduction in Area: [1:100.00%] [3:99.10%] [4:N/A] % Reduction in Volume: [1:100.00%] [3:98.90%] [4:N/A] Classification: [1:Full Thickness Without Exposed Support Structures] [3:Full Thickness Without Exposed Support Structures] [4:N/A] Periwound Skin Texture: [1:No Abnormalities Noted] [3:No  Abnormalities Noted] [4:No Abnormalities Noted] Periwound Skin Moisture: [1:No Abnormalities Noted] [3:No Abnormalities Noted] [4:No Abnormalities Noted] Periwound Skin Color: [1:No Abnormalities Noted No] [3:No Abnormalities Noted No] [4:No Abnormalities Noted No] Treatment Notes Electronic Signature(s) Signed: 02/04/2018 4:53:36 PM By: [name] Entered By: [name] on 02/04/2018 08:15:46 Chaloux, Jomarie Longs ([id number]) -------------------------------------------------------------------------------- Multi-Disciplinary Care Plan Details Patient Name: [name] Date of Service: 02/04/2018 8:00 AM Medical Record Number: [id number] Patient Account Number: [id number] Date of Birth/Sex: Jan 24, 1981 (37 y.o. M) Treating RN: [name] Primary Care Quandra Fedorchak: [name] Other Clinician: Referring Tazaria Dlugosz: [name] Treating Tyrie Porzio/Extender: STONE III, [name] in Treatment: 9 Active Inactive ` Orientation to the Wound Care Program Nursing Diagnoses: Knowledge deficit related to the wound healing center program Goals: Patient/caregiver will verbalize understanding of the Wound Healing Center  Program Date Initiated: 12/03/2017 Target Resolution Date: 12/24/2017 Goal Status: Active Interventions: Provide education on orientation to the wound center Notes: ` Wound/Skin Impairment Nursing Diagnoses: Impaired tissue integrity Goals: Patient/caregiver will verbalize understanding of skin care regimen Date Initiated: 12/03/2017 Target Resolution Date: 12/24/2017 Goal Status: Active Ulcer/skin breakdown will have a volume reduction of 30% by week 4 Date Initiated: 12/03/2017 Target Resolution Date: 12/24/2017 Goal Status: Active Interventions: Assess patient/caregiver ability to obtain necessary supplies Assess patient/caregiver ability to perform ulcer/skin care regimen upon admission and as needed Assess ulceration(s) every visit Treatment Activities: Skin care regimen initiated : 12/03/2017 Notes: Electronic Signature(s) Signed: 02/04/2018 4:53:36 PM By: French Ana, Jomarie Longs ([id number]) Entered By: [name] on 02/04/2018 08:15:38 Loomis, Jomarie Longs ([id number]) -------------------------------------------------------------------------------- Pain Assessment Details Patient Name: [name] Date of Service: 02/04/2018 8:00 AM Medical Record Number: [id number] Patient Account Number: [id number] Date of Birth/Sex: [birth year] (37 y.o. M) Treating RN: [name] Primary Care Tran Randle: [name] Other Clinician: Referring Jemia Fata: [name] Treating Olando Willems/Extender: STONE III, [name] in Treatment: 9 Active Problems Location of Pain Severity and Description of Pain Patient Has Paino No Site Locations Pain Management and Medication Current Pain Management: Electronic Signature(s) Signed: 02/04/2018 4:53:29 PM By: [name] Entered By: [name] on 02/04/2018 08:00:33 Schriver, Jomarie Longs ([id number]) -------------------------------------------------------------------------------- Patient/Caregiver Education Details Patient Name: [name] Date of Service: 02/04/2018 8:00 AM Medical Record Number: [id number] Patient Account Number: [id number] Date of Birth/Gender: 07/22/1981 (37 y.o. M) Treating RN: [name] Primary Care Physician: [name] Other Clinician: Referring Physician: Abner [name] Treating Physician/Extender: Skeet Simmer in Treatment: 9 Education Assessment Education Provided To: Patient Education Topics Provided Wound/Skin Impairment: Handouts: Other: wound care as ordered Methods: Explain/Verbal Responses: State content correctly Electronic Signature(s) Signed: 02/04/2018 4:53:29 PM By: [name] Entered By: [name] on 02/04/2018 09:03:49 Fell, Mahdi ([id number]) -------------------------------------------------------------------------------- Wound Assessment Details Patient Name: [name] Date of Service: 02/04/2018 8:00 AM Medical Record Number: [id number] Patient Account Number: [id number] Date of Birth/Sex: 1981/04/18 (36 y.o. M) Treating RN: [name] Primary Care Amandalynn Pitz: [name] Other Clinician: Referring Barett Whidbee: [name] Treating Chistine Dematteo/Extender: STONE III, [name] in Treatment: 9 Wound Status Wound Number: 1 Primary Etiology: Calciphylaxis Wound Location: Right, Anterior Upper Leg Wound Status: Open Wounding Event: Gradually Appeared Date Acquired: 04/23/2017 [name] Of Treatment: 9 Clustered Wound: No Photos Photo Uploaded By: [name] on 02/04/2018 09:04:54 Wound Measurements Length: (cm) 0 Width: (cm) 0 Depth: (cm) 0 Area: (cm) 0 Volume: (cm) 0 % Reduction in Area: 100% % Reduction in Volume: 100% Wound Description Full Thickness Without Exposed Support Classification: Structures Periwound Skin Texture Texture Color No Abnormalities Noted: No No Abnormalities Noted: No Moisture No Abnormalities  Noted: No Electronic Signature(s) Signed: 02/04/2018 4:53:36 PM By: [name] Entered By: [name] on 02/04/2018 08:21:37 DILYN, SMILES ([id number]) -------------------------------------------------------------------------------- Wound Assessment Details Patient Name: [name] Date of Service: 02/04/2018 8:00 AM Medical Record Number: [id number] Patient Account Number: [id number] Date of Birth/Sex: 13-Feb-1981 (37 y.o. M) Treating RN: [name] Primary Care Asaad Gulley: [name] Other Clinician: Referring Agata Lucente: [name] Treating Jarian Longoria/Extender: STONE III, [name] in Treatment: 9 Wound Status Wound Number: 3 Primary Skin Tear Etiology: Wound Location: Left Upper Leg - Medial Wound Status: Healed - Epithelialized Wounding Event: Shear/Friction Comorbid Anemia, Asthma, Hypertension, End Stage Date Acquired: 01/09/2018 History: Renal Disease [name] Of Treatment: 3 Clustered Wound: No Photos Photo Uploaded By: [name] on 02/04/2018 09:04:54 Wound Measurements Length: (cm) 0 % Redu Width: (cm) 0 % Redu Depth: (cm) 0 Epithe Area: (cm) 0 Tunne Volume: (cm) 0 Under ction in Area: 100% ction in Volume: 100% lialization: None ling: No mining: No Wound Description Full Thickness Without Exposed Support Classification: Structures Wound Margin: Flat and Intact Exudate Medium Amount: Exudate Type: Serosanguineous Exudate Color: red, brown Foul Odor After Cleansing: No Slough/Fibrino Yes Wound Bed Granulation Amount: Large (67-100%) Exposed Structure Granulation Quality: Red Fascia Exposed: No Necrotic Amount: Small (1-33%) Fat Layer (Subcutaneous Tissue) Exposed: No Necrotic Quality: Adherent Slough Tendon Exposed: No Muscle Exposed: No Joint Exposed: No Bone Exposed: No Rathbun, Treyvonne ([id number]) Periwound Skin Texture Texture Color No Abnormalities Noted: No No Abnormalities Noted: No Moisture Temperature / Pain No Abnormalities Noted: No Temperature: No Abnormality Tenderness on Palpation: Yes Wound Preparation Ulcer  Cleansing: Rinsed/Irrigated with Saline Topical Anesthetic Applied: Other: lidocaine 4%, Electronic Signature(s) Signed: 02/04/2018 4:53:36 PM By: [name] Entered By: [name] on 02/04/2018 08:22:31 ILIR, MAHRT ([id number]) -------------------------------------------------------------------------------- Wound Assessment Details Patient Name: [name] Date of Service: 02/04/2018 8:00 AM Medical Record Number: [id number] Patient Account Number: [id number] Date of Birth/Sex: 10-Apr-1981 (37 y.o. M) Treating RN: [name] Primary Care Somaly Marteney: [name] Other Clinician: Referring Marsha Hillman: [name] Treating Parveen Freehling/Extender: STONE III, [name] in Treatment: 9 Wound Status Wound Number: 4 Primary Etiology: Calciphylaxis Wound Location: Left, Lateral Abdomen - Lower Quadrant Wound Status: Open Wounding Event: Gradually Appeared Date Acquired: 02/04/2018 [name] Of Treatment: 0 Clustered Wound: No Photos Photo Uploaded By: [name] on 02/04/2018 09:05:09 Wound Measurements Length: (cm) Width: (cm) Depth: (cm) Area: (cm) Volume: (cm) 0.6 % Reduction in Area: 0.9 % Reduction in Volume: 0.1 0.424 0.042 Periwound Skin Texture Texture Color No Abnormalities Noted: No No Abnormalities Noted: No Moisture No Abnormalities Noted: No Treatment Notes Wound #4 (Left, Lateral Abdomen - Lower Quadrant) 1. Cleansed with: Clean wound with Normal Saline 2. Anesthetic Topical Lidocaine 4% cream to wound bed prior to debridement 3. Peri-wound Care: Skin Prep 4. Dressing Applied: Xeroform [place], Jakell ([id number]) 5. Secondary Dressing Applied Bordered Foam Dressing Notes healed areas covered with skin prep and BFD only for protection Electronic Signature(s) Signed: 02/04/2018 4:53:29 PM By: [name] Entered By: [name] on 02/04/2018 08:08:10 Geraldo, Jomarie Longs  ([id number]) -------------------------------------------------------------------------------- Vitals Details Patient Name: [name] Date of Service: 02/04/2018 8:00 AM Medical Record Number: [id number] Patient Account Number: [id number] Date of Birth/Sex: Sep 30, 1980 (37 y.o. M) Treating RN: [name] Primary Care Lelah Rennaker: [name] Other Clinician: Referring Persia Lintner: [name] Treating Charletta Voight/Extender: STONE III, [name] in Treatment: 9 Vital Signs Time Taken: 08:01 Temperature (F): 98.1 Height (in): 70 Pulse (bpm): 67 Weight (lbs): 245 Respiratory Rate (breaths/min): 16 Body Mass Index (BMI): 35.2 Blood Pressure (mmHg):  103/68 Reference Range: 80 - 120 mg / dl Electronic Signature(s) Signed: 02/04/2018 4:53:29 PM By: [name] Entered By: [name] on 02/04/2018 08:02:19

## 2018-02-18 ENCOUNTER — Encounter: Payer: Medicaid Other | Attending: Physician Assistant | Admitting: Physician Assistant

## 2018-02-18 DIAGNOSIS — I5033 Acute on chronic diastolic (congestive) heart failure: Secondary | ICD-10-CM | POA: Diagnosis not present

## 2018-02-18 DIAGNOSIS — I110 Hypertensive heart disease with heart failure: Secondary | ICD-10-CM | POA: Insufficient documentation

## 2018-02-18 DIAGNOSIS — L97122 Non-pressure chronic ulcer of left thigh with fat layer exposed: Secondary | ICD-10-CM | POA: Insufficient documentation

## 2018-02-18 DIAGNOSIS — L97112 Non-pressure chronic ulcer of right thigh with fat layer exposed: Secondary | ICD-10-CM | POA: Diagnosis not present

## 2018-02-19 NOTE — Progress Notes (Signed)
ISSACC, MERLO ([id number]) Visit Report for 02/18/2018 Arrival Information Details Patient Name: Brian [name], Brian [name] Date of Service: 02/18/2018 8:00 AM Medical Record Number: [id number] Patient Account Number: [id number] Date of Birth/Sex: 07/06/1981 (37 y.o. M) Treating RN: Brian [name] Primary Care Brian [name]: Brian [name] Other Clinician: Referring Brian [name]: Brian [name] Treating Brian [name]/Extender: Brian [name], Brian [name] in Treatment: 11 Visit Information History Since Last Visit All ordered tests and consults were completed: No Patient Arrived: Ambulatory Added or deleted any medications: No Arrival Time: 08:00 Any new allergies or adverse reactions: No Accompanied By: self Had a fall or experienced change in No Transfer Assistance: None activities of daily living that may affect Patient Identification Verified: Yes risk of falls: Secondary Verification Process Yes Signs or symptoms of abuse/neglect since last visito No Completed: Hospitalized since last visit: No Patient Requires Transmission-Based No Implantable device outside of the clinic excluding No Precautions: cellular tissue based products placed in the center Patient Has Alerts: Yes since last visit: Patient Alerts: Patient on Blood Has Dressing in Place as Prescribed: Yes Thinner Pain Present Now: Yes Eliquis Electronic Signature(s) Signed: 02/18/2018 5:33:19 PM By: Brian [name] Entered By: Brian [name] on 02/18/2018 08:01:43 [name], Brian [name] ([id number]) -------------------------------------------------------------------------------- Encounter Discharge Information Details Patient Name: Brian [name] Date of Service: 02/18/2018 8:00 AM Medical Record Number: [id number] Patient Account Number: [id number] Date of Birth/Sex: 11-May-1981 (37 y.o. M) Treating RN: Brian [name] Primary Care Jerrett Baldinger: Brian [name] Other Clinician: Referring Tremeka Helbling: Brian [name] Treating Brian [name]/Extender: Brian [name], Brian [name] in  Treatment: 11 Encounter Discharge Information Items Discharge Condition: Stable Ambulatory Status: Ambulatory Discharge Destination: Skilled Nursing Facility Telephoned: No Orders Sent: Yes Transportation: Private Auto Accompanied By: self Schedule Follow-up Appointment: Yes Clinical Summary of Care: Electronic Signature(s) Signed: 02/18/2018 10:05:08 AM By: Brian [name] Entered By: Brian [name] on 02/18/2018 10:05:07 Brian [name] ([id number]) -------------------------------------------------------------------------------- Lower Extremity Assessment Details Patient Name: Brian [name] Date of Service: 02/18/2018 8:00 AM Medical Record Number: [id number] Patient Account Number: [id number] Date of Birth/Sex: [birth year] (37 y.o. M) Treating RN: Brian [name] Primary Care Brian [name]: Brian [name] Other Clinician: Referring Brian [name]: Brian [name] Treating Brian [name]/Extender: Brian [name], Brian [name] in Treatment: 11 Electronic Signature(s) Signed: 02/18/2018 5:33:19 PM By: Brian [name] Entered By: Brian [name] Brian [name], Brian [name] ([id number]) -------------------------------------------------------------------------------- Multi Wound Chart Details Patient Name: Brian [name] Date of Service: 02/18/2018 8:00 AM Medical Record Number: [id number] Patient Account Number: [id number] Date of Birth/Sex: Mar 01, 1981 (37 y.o. M) Treating RN: Brian [name] Primary Care Brian [name]: Brian [name] Other Clinician: Referring Brian [name]: Brian [name] Treating Brian [name]/Extender: Brian [name], Brian [name] in Treatment: 11 Vital Signs Height(in): 70 Pulse(bpm): 63 Weight(lbs): 245 Blood Pressure(mmHg): 104/51 Body Mass Index(BMI): 35 Temperature(F): 98.1 Respiratory Rate 16 (breaths/min): Photos: [4:No Photos] [5:No Photos] [6:No Photos] Wound Location: [4:Left Abdomen - Lower Quadrant - Lateral] [5:Left Upper Leg - Medial] [6:Right Upper Leg - Anterior] Wounding Event: [4:Gradually  Appeared] [5:Gradually Appeared] [6:Gradually Appeared] 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] Comorbid History: [4:Anemia, Asthma, Hypertension, End Stage Renal Disease] [5:Anemia, Asthma, Hypertension, End Stage Renal Disease] [6:Anemia, Asthma, Hypertension, End Stage Renal Disease] Date Acquired: [4:02/04/2018] [5:02/18/2018] [6:02/18/2018] [name] of Treatment: [4:2] [5:0] [6:0] Wound Status: [4:Open] [5:Open] [6:Open] Measurements L x W x D [4:2.5x2.7x0.1] [5:0.7x0.7x0.1] [6:0.8x1.3x0.1] (cm) Area (cm) : [4:5.301] [5:0.385] [6:0.817] Volume (cm) : [4:0.53] [5:0.038] [6:0.082] % Reduction in Area: [4:-1150.20%] [5:0.00%] [6:0.00%] % Reduction in Volume: [4:-1161.90%] [5:0.00%] [6:0.00%] Classification: [4:Full Thickness Without Exposed Support Structures] [5:Full Thickness Without Exposed Support Structures] [6:Full Thickness Without Exposed Support Structures] Exudate Amount: [4:Large] [5:Large] [6:Large] Exudate  Type: [4:Serosanguineous] [5:Serosanguineous] [6:Purulent] Exudate Color: [4:red, brown] [5:red, brown] [6:yellow, brown, green] Wound Margin: [4:Distinct, outline attached] [5:Distinct, outline attached] [6:Distinct, outline attached] Granulation Amount: [4:Medium (34-66%)] [5:Large (67-100%)] [6:Large (67-100%)] Granulation Quality: [4:Pink] [5:Red, Hyper-granulation] [6:Red, Hyper-granulation] Necrotic Amount: [4:Medium (34-66%)] [5:None Present (0%)] [6:None Present (0%)] Necrotic Tissue: [4:Eschar, Adherent Slough] [5:N/A] [6:N/A] Exposed Structures: [4:Fascia: No Fat Layer (Subcutaneous Tissue) Exposed: No Tendon: No Muscle: No Joint: No Bone: No] [5:Fascia: No Fat Layer (Subcutaneous Tissue) Exposed: No Tendon: No Muscle: No Joint: No Bone: No] [6:Fascia: No Fat Layer (Subcutaneous Tissue) Exposed:  No Tendon: No Muscle: No Joint: No Bone: No] Epithelialization: [4:None] [5:None] [6:None] Periwound Skin Texture: [4:No Abnormalities Noted]  [5:No Abnormalities Noted] [6:No Abnormalities Noted] Periwound Skin Moisture: [4:No Abnormalities Noted] [5:No Abnormalities Noted] [6:No Abnormalities Noted] Periwound Skin Color: No Abnormalities Noted No Abnormalities Noted No Abnormalities Noted Temperature: No Abnormality No Abnormality No Abnormality Tenderness on Palpation: Yes Yes Yes Wound Preparation: Ulcer Cleansing: Ulcer Cleansing: Ulcer Cleansing: Rinsed/Irrigated with Saline Rinsed/Irrigated with Saline Rinsed/Irrigated with Saline Topical Anesthetic Applied: Topical Anesthetic Applied: Topical Anesthetic Applied: Other: lidocaine 4% Other: lidocaine 4% Other: lidocaine 4% Treatment Notes Electronic Signature(s) Signed: 02/19/2018 11:46:23 AM By: Brian CriglerFlinchum, Cheryl Entered By: Brian CriglerFlinchum, Cheryl on 02/18/2018 08:20:11 Brian [name], Carle ([id number]) -------------------------------------------------------------------------------- Multi-Disciplinary Care Plan Details Patient Name: Brian [name], Brian [name] Date of Service: 02/18/2018 8:00 AM Medical Record Number: [id number] Patient Account Number: [id number] Date of Birth/Sex: 07/17/1981 (37 y.o. M) Treating RN: Brian CriglerFlinchum, Cheryl Primary Care Jaxtin Raimondo: Brian GreenspanHODGES, BETH Other Clinician: Referring Zoriah Pulice: Brian GreenspanHODGES, BETH Treating Esmirna Ravan/Extender: Brian [name], Brian [name] in Treatment: 11 Active Inactive ` Orientation to the Wound Care Program Nursing Diagnoses: Knowledge deficit related to the wound healing center program Goals: Patient/caregiver will verbalize understanding of the Wound Healing Center Program Date Initiated: 12/03/2017 Target Resolution Date: 12/24/2017 Goal Status: Active Interventions: Provide education on orientation to the wound center Notes: ` Wound/Skin Impairment Nursing Diagnoses: Impaired tissue integrity Goals: Patient/caregiver will verbalize understanding of skin care regimen Date Initiated: 12/03/2017 Target Resolution Date: 12/24/2017 Goal Status:  Active Ulcer/skin breakdown will have a volume reduction of 30% by week 4 Date Initiated: 12/03/2017 Target Resolution Date: 12/24/2017 Goal Status: Active Interventions: Assess patient/caregiver ability to obtain necessary supplies Assess patient/caregiver ability to perform ulcer/skin care regimen upon admission and as needed Assess ulceration(s) every visit Treatment Activities: Skin care regimen initiated : 12/03/2017 Notes: Electronic Signature(s) Signed: 02/19/2018 11:46:23 AM By: French AnaFlinchum, Cheryl Hashman, Brian LongsJOSEPH ([id number]) Entered By: Brian CriglerFlinchum, Cheryl on 02/18/2018 08:20:03 Brian [name], Brian [name] ([id number]) -------------------------------------------------------------------------------- Pain Assessment Details Patient Name: Brian [name], Dequon Date of Service: 02/18/2018 8:00 AM Medical Record Number: [id number] Patient Account Number: [id number] Date of Birth/Sex: 07/17/1981 (37 y.o. M) Treating RN: Brian HaggisPinkerton, Debi Primary Care Dorean Daniello: Brian GreenspanHODGES, BETH Other Clinician: Referring Lerline Valdivia: Brian GreenspanHODGES, BETH Treating Barre Aydelott/Extender: Brian [name], Brian [name] in Treatment: 11 Active Problems Location of Pain Severity and Description of Pain Patient Has Paino Yes Site Locations Pain Location: Pain in Ulcers Rate the pain. Current Pain Level: 2 Character of Pain Describe the Pain: Aching Pain Management and Medication Current Pain Management: Notes Topical or injectable lidocaine is offered to patient for acute pain when surgical debridement is performed. If needed, Patient is instructed to use over the counter pain medication for the following 24-48 hours after debridement. Wound care MDs do not prescribed pain medications. Patient has chronic pain or uncontrolled pain. Patient has been instructed to make an appointment with their Primary Care Physician for pain management. Electronic Signature(s) Signed: 02/18/2018 5:33:19 PM By: Brian MullingPinkerton, Debra Entered  By: Brian [name] on 02/18/2018  08:02:10 Brian [name] ([id number]) -------------------------------------------------------------------------------- Patient/Caregiver Education Details Patient Name: Brian [name] Date of Service: 02/18/2018 8:00 AM Medical Record Number: [id number] Patient Account Number: [id number] Date of Birth/Gender: Jan 06, 1981 (37 y.o. M) Treating RN: Brian [name] Primary Care Physician: Brian [name] Other Clinician: Referring Physician: Abner [name] Treating Physician/Extender: Skeet Simmer in Treatment: 11 Education Assessment Education Provided To: Patient Education Topics Provided Wound/Skin Impairment: Handouts: Other: wound carea s ordered Methods: Demonstration, Explain/Verbal Responses: State content correctly Electronic Signature(s) Signed: 02/18/2018 5:34:11 PM By: Brian [name] Entered By: Brian [name] on 02/18/2018 10:05:56 Brian [name], Brian [name] ([id number]) -------------------------------------------------------------------------------- Wound Assessment Details Patient Name: Brian [name] Date of Service: 02/18/2018 8:00 AM Medical Record Number: [id number] Patient Account Number: [id number] Date of Birth/Sex: [birth year] (36 y.o. M) Treating RN: Brian [name] Primary Care Terrell Shimko: Brian [name] Other Clinician: Referring Osher Oettinger: Brian [name] Treating Alwilda Gilland/Extender: Brian [name], Brian [name] in Treatment: 11 Wound Status Wound Number: 4 Primary Calciphylaxis Etiology: Wound Location: Left Abdomen - Lower Quadrant - Lateral Wound Status: Open Wounding Event: Gradually Appeared Comorbid Anemia, Asthma, Hypertension, End Stage History: Renal Disease Date Acquired: 02/04/2018 [name] Of Treatment: 2 Clustered Wound: No Photos Photo Uploaded By: Brian [name] on 02/18/2018 15:19:50 Wound Measurements Length: (cm) 2.5 Width: (cm) 2.7 Depth: (cm) 0.1 Area: (cm) 5.301 Volume: (cm) 0.53 % Reduction in Area: -1150.2% % Reduction in Volume: -1161.9% Epithelialization:  None Tunneling: No Undermining: No Wound Description Full Thickness Without Exposed Support Classification: Structures Wound Margin: Distinct, outline attached Exudate Large Amount: Exudate Type: Serosanguineous Exudate Color: red, brown Foul Odor After Cleansing: No Slough/Fibrino Yes Wound Bed Granulation Amount: Medium (34-66%) Exposed Structure Granulation Quality: Pink Fascia Exposed: No Necrotic Amount: Medium (34-66%) Fat Layer (Subcutaneous Tissue) Exposed: No Necrotic Quality: Eschar, Adherent Slough Tendon Exposed: No Muscle Exposed: No Joint Exposed: No Bone Exposed: No Periwound Skin Texture Brian [name], Brian [name] ([id number]) Texture Color No Abnormalities Noted: No No Abnormalities Noted: No Moisture Temperature / Pain No Abnormalities Noted: No Temperature: No Abnormality Tenderness on Palpation: Yes Wound Preparation Ulcer Cleansing: Rinsed/Irrigated with Saline Topical Anesthetic Applied: Other: lidocaine 4%, Treatment Notes Wound #4 (Left, Lateral Abdomen - Lower Quadrant) 1. Cleansed with: Clean wound with Normal Saline 4. Dressing Applied: Xeroform 5. Secondary Dressing Applied Bordered Foam Dressing Electronic Signature(s) Signed: 02/18/2018 5:33:19 PM By: Brian [name] Entered By: Brian [name] on 02/18/2018 08:13:30 Brian [name], Brian [name] ([id number]) -------------------------------------------------------------------------------- Wound Assessment Details Patient Name: Brian [name] Date of Service: 02/18/2018 8:00 AM Medical Record Number: [id number] Patient Account Number: [id number] Date of Birth/Sex: 1980/11/02 (37 y.o. M) Treating RN: Brian [name] Primary Care Eren Puebla: Brian [name] Other Clinician: Referring Lusine Corlett: Brian [name] Treating Andreas Sobolewski/Extender: Brian [name], Brian [name] in Treatment: 11 Wound Status Wound Number: 5 Primary Calciphylaxis Etiology: Wound Location: Left Upper Leg - Medial Wound Status: Open Wounding Event: Gradually  Appeared Comorbid Anemia, Asthma, Hypertension, End Stage Date Acquired: 02/18/2018 History: Renal Disease [name] Of Treatment: 0 Clustered Wound: No Photos Photo Uploaded By: Brian [name] on 02/18/2018 15:20:13 Wound Measurements Length: (cm) 0.7 Width: (cm) 0.7 Depth: (cm) 0.1 Area: (cm) 0.385 Volume: (cm) 0.038 % Reduction in Area: 0% % Reduction in Volume: 0% Epithelialization: None Tunneling: No Undermining: No Wound Description Full Thickness Without Exposed Support Classification: Structures Wound Margin: Distinct, outline attached Exudate Large Amount: Exudate Type: Serosanguineous Exudate Color: red, brown Foul Odor After Cleansing: No Slough/Fibrino No Wound Bed Granulation Amount: Large (67-100%) Exposed Structure Granulation Quality: Red, Hyper-granulation Fascia Exposed: No Necrotic Amount: None Present (0%) Fat Layer (Subcutaneous  Tissue) Exposed: No Tendon Exposed: No Muscle Exposed: No Joint Exposed: No Bone Exposed: No Periwound Skin Texture Texture Color Knee, Calyb ([id number]) No Abnormalities Noted: No No Abnormalities Noted: No Moisture Temperature / Pain No Abnormalities Noted: No Temperature: No Abnormality Tenderness on Palpation: Yes Wound Preparation Ulcer Cleansing: Rinsed/Irrigated with Saline Topical Anesthetic Applied: Other: lidocaine 4%, Treatment Notes Wound #5 (Left, Medial Upper Leg) 1. Cleansed with: Clean wound with Normal Saline 4. Dressing Applied: Hydrafera Blue 5. Secondary Dressing Applied Bordered Foam Dressing Electronic Signature(s) Signed: 02/18/2018 5:33:19 PM By: Brian [name] Entered By: Brian [name] on 02/18/2018 08:09:15 Brian [name], Brian [name] ([id number]) -------------------------------------------------------------------------------- Wound Assessment Details Patient Name: Brian [name] Date of Service: 02/18/2018 8:00 AM Medical Record Number: [id number] Patient Account Number: [id number] Date of  Birth/Sex: [birth year] (37 y.o. M) Treating RN: Brian [name] Primary Care Kiri Hinderliter: Brian [name] Other Clinician: Referring Odelle Kosier: Brian [name] Treating Aasha Dina/Extender: Brian [name], Brian [name] in Treatment: 11 Wound Status Wound Number: 6 Primary Calciphylaxis Etiology: Wound Location: Right Upper Leg - Anterior Wound Status: Open Wounding Event: Gradually Appeared Comorbid Anemia, Asthma, Hypertension, End Stage Date Acquired: 02/18/2018 History: Renal Disease [name] Of Treatment: 0 Clustered Wound: No Photos Photo Uploaded By: Brian [name] on 02/18/2018 15:20:22 Wound Measurements Length: (cm) 0.8 Width: (cm) 1.3 Depth: (cm) 0.1 Area: (cm) 0.817 Volume: (cm) 0.082 % Reduction in Area: 0% % Reduction in Volume: 0% Epithelialization: None Tunneling: No Undermining: No Wound Description Full Thickness Without Exposed Support Classification: Structures Wound Margin: Distinct, outline attached Exudate Large Amount: Exudate Type: Purulent Exudate Color: yellow, brown, green Foul Odor After Cleansing: No Slough/Fibrino No Wound Bed Granulation Amount: Large (67-100%) Exposed Structure Granulation Quality: Red, Hyper-granulation Fascia Exposed: No Necrotic Amount: None Present (0%) Fat Layer (Subcutaneous Tissue) Exposed: No Tendon Exposed: No Muscle Exposed: No Joint Exposed: No Bone Exposed: No Periwound Skin Texture Texture Color Brian [name], Hiroyuki ([id number]) No Abnormalities Noted: No No Abnormalities Noted: No Moisture Temperature / Pain No Abnormalities Noted: No Temperature: No Abnormality Tenderness on Palpation: Yes Wound Preparation Ulcer Cleansing: Rinsed/Irrigated with Saline Topical Anesthetic Applied: Other: lidocaine 4%, Treatment Notes Wound #6 (Right, Anterior Upper Leg) 1. Cleansed with: Clean wound with Normal Saline 4. Dressing Applied: Hydrafera Blue 5. Secondary Dressing Applied Bordered Foam Dressing Electronic  Signature(s) Signed: 02/18/2018 5:33:19 PM By: Brian [name] Entered By: Brian [name] on 02/18/2018 08:10:09 Brian [name], Brian [name] ([id number]) -------------------------------------------------------------------------------- Vitals Details Patient Name: Brian [name] Date of Service: 02/18/2018 8:00 AM Medical Record Number: [id number] Patient Account Number: [id number] Date of Birth/Sex: Sep 29, 1980 (37 y.o. M) Treating RN: Brian [name] Primary Care Mattingly Fountaine: Brian [name] Other Clinician: Referring Mizraim Harmening: Brian [name] Treating Kalina Morabito/Extender: Brian [name], Brian [name] in Treatment: 11 Vital Signs Time Taken: 08:03 Temperature (F): 98.1 Height (in): 70 Pulse (bpm): 63 Weight (lbs): 245 Respiratory Rate (breaths/min): 16 Body Mass Index (BMI): 35.2 Blood Pressure (mmHg): 104/51 Reference Range: 80 - 120 mg / dl Electronic Signature(s) Signed: 02/18/2018 5:33:19 PM By: Brian [name] Entered By: Brian [name] on 02/18/2018 08:04:57

## 2018-02-19 NOTE — Progress Notes (Signed)
LESLY, JOSLYN ([id number]) Visit Report for 02/18/2018 Chief Complaint Document Details Patient Name: [name], [name] Date of Service: 02/18/2018 8:00 AM Medical Record Number: [id number] Patient Account Number: [id number] Date of Birth/Sex: 01-24-1981 (37 y.o. M) Treating RN: Roger Shelter Primary Care Provider: Marco Collie Other Clinician: Referring Provider: Marco Collie Treating Provider/Extender: Melburn Hake, HOYT Weeks in Treatment: 11 Information Obtained from: Patient Chief Complaint Bilateral thigh Calciphylaxis Ulcers Electronic Signature(s) Signed: 02/18/2018 8:46:34 PM By: Worthy Keeler PA-C Entered By: Worthy Keeler on 02/18/2018 08:15:56 [place], [place] ([id number]) -------------------------------------------------------------------------------- HPI Details Patient Name: [name] Date of Service: 02/18/2018 8:00 AM Medical Record Number: [id number] Patient Account Number: [id number] Date of Birth/Sex: [birth year] (37 y.o. 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 He has currently been undergoing treatment according to notes reviewed from 11/28/17 for this. 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 Subsequently patient was using Santyl for very long time but is no longer using that at this point. He is receiving sodium thiosulfate during hemodialysis along with Sevelamer to help control his phosphorus levels. Currently a steroid cream was recommended for the wounds although he has not used that yet. He a has what appears to be extremely hyper granular and poorly attached  granulation noted over the bed of the wound. This is true of both wounds of lower extremities. He does show some signs of epithelialization around the border although this has been very slow according to the patient. Fortunately he is not having significant pain at this point. He does have a history of hypertension as well is congestive heart failure. Otherwise he states the wounds are much better than when I first started. 12/10/17 on evaluation today patient actually presents with signs of improvement in regard to his bilateral thigh ulcer. He has been tolerating the dressing changes without complication which is good news. With that being said is not really having a significant amount of bleeding at this point today is excellent. He has been pleased with the progress as well. There's no evidence of infection. 12/19/17; patient normally followed by Jeri Cos on Mondays. In his absence he comes in to see me today. 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 Initially painful necrotic wounds that have been debrided to a healthy surface. He is using Hydrofera Blue. He has one on the right anterior thigh and one on the left 12/24/17 on evaluation today patient's wounds actually appear to be doing excellent in regard to the bilateral upper thighs. He has been tolerating the dressing changes without complication and overall he seems to be making great progress especially on the left. He has no pain. 12/31/17 on evaluation today patient appears to be doing excellent in regard to his bilateral lower extremity ulcers. The left in fact appears to be completely healed which is great news the right is smaller and doing very well. Overall I'm pleased with the progress he's made the patient is very happy. 01/14/18 on evaluation today patient's ulcer on the right thigh actually appears to be doing much better. It's getting smaller. Unfortunately the left thigh ulcer reopen due to  the dressing having gotten stuck to the wound bed. Subsequently  this pulled off the top layer of skin and he does have an open wound at the site yet again. With that being said there does not appear to be evidence of infection overall things have been going well. 02/04/18 on evaluation today patient's ulcers on his thighs actually appear to be doing fairly well at this point. There does not appear to be any evidence of infection and actually appear to be close today. Unfortunately he has a new wound on the left lateral abdominal region he also has an area of what appears to be superficial bruising noted on the right lateral abdominal region. With that being said I cannot tell for sure that this is actually a Calciphylaxis issue or not at this point it could be but it also may just be the normal bruising/irritation due to his close rubbing. He states this is always been an area that does rub significantly. That's part of the reason why he's looking at the surgery that he's actually scheduled for reevaluation four on 02/12/18. He thought this was sooner when I met with him last but this is actually when the appointment is. 02/18/18 on evaluation today unfortunately both of the patient's upper thigh ulcers have reopened. He does seem to have some hyper granular tissue underneath and I was hoping that this would actually resolve and do well although it appears that the epithelium just has not been able to take due to the fact that there is so much hyper granular tissue underlying. For that reason again this has reopened that does need to be addressed today. Nonetheless the patient is not having any significant pain at the site which is good news. He is having more pain on the left lateral flank where he has a new area of opening due to what appears to be also Calciphylaxis. [name], [name] ([id number]) Electronic Signature(s) Signed: 02/18/2018 8:46:34 PM By: Worthy Keeler PA-C Entered By: Worthy Keeler on  02/18/2018 [address] ([id number]) -------------------------------------------------------------------------------- Otelia Sergeant TISS Details Patient Name: [name] Date of Service: 02/18/2018 8:00 AM Medical Record Number: [id number] Patient Account Number: [id number] Date of Birth/Sex: 09/28/1980 (37 y.o. M) Treating RN: Roger Shelter Primary Care Provider: Marco Collie Other Clinician: Referring Provider: Marco Collie Treating Provider/Extender: Melburn Hake, HOYT Weeks in Treatment: 11 Procedure Performed for: Wound #6 Right,Anterior Upper Leg Performed By: Physician Emilio Math., PA-C Post Procedure Diagnosis Same as Pre-procedure Electronic Signature(s) Signed: 02/19/2018 11:46:23 AM By: Roger Shelter Entered By: Roger Shelter on 02/18/2018 08:22:34 [place], [place] ([id number]) -------------------------------------------------------------------------------- Otelia Sergeant TISS Details Patient Name: [name] Date of Service: 02/18/2018 8:00 AM Medical Record Number: [id number] Patient Account Number: [id number] Date of Birth/Sex: 04-04-1981 (37 y.o. M) Treating RN: Roger Shelter Primary Care Provider: Marco Collie Other Clinician: Referring Provider: Marco Collie Treating Provider/Extender: Melburn Hake, HOYT Weeks in Treatment: 11 Procedure Performed for: Wound #5 Left,Medial Upper Leg Performed By: Physician Emilio Math., PA-C Post Procedure Diagnosis Same as Pre-procedure Electronic Signature(s) Signed: 02/19/2018 11:46:23 AM By: Roger Shelter Entered By: Roger Shelter on 02/18/2018 08:22:48 [name] ([id number]) -------------------------------------------------------------------------------- Physical Exam Details Patient Name: [name] Date of Service: 02/18/2018 8:00 AM Medical Record Number: [id number] Patient Account Number: [id number] Date of Birth/Sex: July 17, 1981 (37 y.o. M) Treating RN: Roger Shelter Primary  Care Provider: Marco Collie Other Clinician: Referring Provider: Marco Collie Treating Provider/Extender: STONE III, HOYT Weeks in Treatment: 70 Constitutional Well-nourished and well-hydrated in no acute distress. Respiratory normal breathing without difficulty. Psychiatric this  patient is able to make decisions and demonstrates good insight into disease process. Alert and Oriented x 3. pleasant and cooperative. Notes Fortunately there does not appear to be any evidence of infection which is great news he's been tolerating the dressing changes without complication. Overall I'm very pleased with the progress that has been made up to this point. I did perform complete cauterization with silver nitrate today in order to help treat the hyper granular tissue hopefully this will be of benefit as well for the patient. In the past this has done very well in order to try and help with new epithelialization and a better wound bed. Electronic Signature(s) Signed: 02/18/2018 8:46:34 PM By: Worthy Keeler PA-C Entered By: Worthy Keeler on 02/18/2018 08:29:10 [place], [place] ([id number]) -------------------------------------------------------------------------------- Physician Orders Details Patient Name: [name] Date of Service: 02/18/2018 8:00 AM Medical Record Number: [id number] Patient Account Number: [id number] Date of Birth/Sex: 1981/05/26 (37 y.o. 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 o Clean wound with Normal Saline. Anesthetic (add to Medication List) Wound #4 Left,Lateral Abdomen - Lower Quadrant o Topical Lidocaine 4% cream applied to wound bed prior to debridement (In Clinic Only). Skin Barriers/Peri-Wound Care Wound #4 Left,Lateral Abdomen - Lower Quadrant o Skin Prep Primary Wound Dressing Wound #4 Left,Lateral Abdomen - Lower Quadrant o Xeroform Wound #5 Left,Medial Upper Leg o Hydrafera Blue Ready Transfer Wound #6 Right,Anterior Upper Leg o Hydrafera Blue Ready Transfer Secondary Dressing Wound #4 Left,Lateral Abdomen - Lower Quadrant o Boardered Foam Dressing Wound #5 Left,Medial Upper Leg o Boardered Foam Dressing Wound #6 Right,Anterior Upper Leg o Boardered Foam Dressing [place], Nicklos ([id number]) Dressing Change Frequency Wound #4 Left,Lateral Abdomen - Lower Quadrant o Change dressing every other day. Wound #5 Left,Medial Upper Leg o Change dressing every other day. Wound #6 Right,Anterior Upper Leg o Change dressing every other day. Follow-up Appointments Wound #4 Left,Lateral Abdomen - Lower Quadrant o Return Appointment in 1 week. Wound #5 Left,Medial Upper Leg o Return Appointment in 1 week. Wound #6 Right,Anterior Upper Leg o Return Appointment in 1 week. Electronic Signature(s) Signed: 02/18/2018 8:46:34 PM By: Worthy Keeler PA-C Signed: 02/19/2018 11:46:23 AM By: Roger Shelter Entered By: Roger Shelter on 02/18/2018 08:25:22 MARTYN, TIMME ([id number]) -------------------------------------------------------------------------------- Problem List Details Patient Name: [name] Date of Service: 02/18/2018 8:00 AM Medical Record Number: [id number] Patient Account Number: [id number] Date of Birth/Sex: June 01, 1981 (37 y.o. 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 12/03/2017 No Yes L97.112 Non-pressure chronic ulcer of right thigh with fat layer 12/03/2017 No Yes exposed [place] (primary) hypertension 12/03/2017 No Yes I50.33 Acute on chronic diastolic (congestive) heart failure 12/03/2017 No Yes Inactive Problems Resolved Problems Electronic Signature(s) Signed: 02/18/2018 8:46:34 PM By: Joaquim Lai  III, Hoyt PA-C Entered By: Worthy Keeler on 02/18/2018 08:15:50 [place], [place] ([id number]) -------------------------------------------------------------------------------- Progress Note Details Patient Name: [name], [name] Date of Service: 02/18/2018 8:00 AM Medical Record Number: [id number] Patient Account Number: [id number] Date of Birth/Sex: 1981/05/23 (38 y.o. M) Treating RN: Roger Shelter Primary Care Provider: Marco Collie Other Clinician: Referring Provider: Marco Collie Treating Provider/Extender: Melburn Hake, HOYT Weeks in Treatment: 11 Subjective Chief Complaint Information obtained from Patient Bilateral thigh Calciphylaxis Ulcers History of Present Illness (HPI) 12/03/17 on evaluation today patient presents for initial evaluation and the wound center concerning issues he has been having with his bilateral lower extremities with Calciphylaxis. He has currently been undergoing treatment according to notes reviewed from 11/28/17 for this. 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 Subsequently patient was using Santyl for very long time but is no longer using that at this point. He is receiving sodium thiosulfate during hemodialysis along with Sevelamer to help control his  phosphorus levels. Currently a steroid cream was recommended for the wounds although he has not used that yet. He a has what appears to be extremely hyper granular and poorly attached granulation noted over the bed of the wound. This is true of both wounds of lower extremities. He does show some signs of epithelialization around the border although this has been very slow according to the patient. Fortunately he is not having significant pain at this point. He does have a history of hypertension as well is congestive heart failure. Otherwise he states the wounds are much better than when I first started. 12/10/17 on evaluation today patient actually presents with signs of improvement in regard to his bilateral thigh ulcer. He has been tolerating the dressing changes without complication which is good news. With that being said is not really having a significant amount of bleeding at this point today is excellent. He has been pleased with the progress as well. There's no evidence of infection. 12/19/17; patient normally followed by Jeri Cos on Mondays. In his absence he comes in to see me today. 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 Initially painful necrotic wounds that have been debrided to a healthy surface. He is using Hydrofera Blue. He has one on the right anterior thigh and one on the left 12/24/17 on evaluation today patient's wounds actually appear to be doing excellent in regard to the bilateral upper thighs. He has been tolerating the dressing changes without complication and overall he seems to be making great progress especially on the left. He has no pain. 12/31/17 on evaluation today patient appears to be doing excellent in regard to his bilateral lower extremity ulcers. The left in fact appears to be completely healed which is great news the right is smaller and doing very well. Overall I'm pleased with the progress he's made the patient is very  happy. 01/14/18 on evaluation today patient's ulcer on the right thigh actually appears to be doing much better. It's getting smaller. Unfortunately the left thigh ulcer reopen due to the dressing having gotten stuck to the wound bed. Subsequently this pulled off the top layer of skin and he does have an open wound at the site yet again. With that being said there does not appear to be evidence of infection overall things have been going well. 02/04/18 on evaluation today patient's ulcers on his thighs actually appear to be doing fairly well at this point. There does not appear to be any evidence of  infection and actually appear to be close today. Unfortunately he has a new wound on the left lateral abdominal region he also has an area of what appears to be superficial bruising noted on the right lateral abdominal region. With that being said I cannot tell for sure that this is actually a Calciphylaxis issue or not at this point it could be but it also may just be the normal bruising/irritation due to his close rubbing. He states this is always been an area that does rub significantly. That's part of the reason why he's looking at the surgery that he's actually scheduled for reevaluation four on 02/12/18. He thought this was sooner when I met with him last but this is actually when the appointment is. 02/18/18 on evaluation today unfortunately both of the patient's upper thigh ulcers have reopened. He does seem to have some [name], [name] ([id number]) hyper granular tissue underneath and I was hoping that this would actually resolve and do well although it appears that the epithelium just has not been able to take due to the fact that there is so much hyper granular tissue underlying. For that reason again this has reopened that does need to be addressed today. Nonetheless the patient is not having any significant pain at the site which is good news. He is having more pain on the left lateral flank where he  has a new area of opening due to what appears to be also Calciphylaxis. Patient History Information obtained from Patient. Family History Diabetes - Maternal Grandparents, Heart Disease - Maternal Grandparents, Hypertension - Maternal Grandparents, Stroke - Maternal Grandparents, No family history of Cancer, Hereditary Spherocytosis, Kidney Disease, Lung Disease, Seizures, Thyroid Problems, Tuberculosis. Social History Never smoker, Marital Status - Single, Alcohol Use - Never, Drug Use - No History, Caffeine Use - Daily. Review of Systems (ROS) Constitutional Symptoms (General Health) Denies complaints or symptoms of Fever, Chills. Respiratory The patient has no complaints or symptoms. Cardiovascular The patient has no complaints or symptoms. Psychiatric The patient has no complaints or symptoms. Objective Constitutional Well-nourished and well-hydrated in no acute distress. Vitals Time Taken: 8:03 AM, Height: 70 in, Weight: 245 lbs, BMI: 35.2, Temperature: 98.1 F, Pulse: 63 bpm, Respiratory Rate: 16 breaths/min, Blood Pressure: 104/51 mmHg. Respiratory normal breathing without difficulty. Psychiatric this patient is able to make decisions and demonstrates good insight into disease process. Alert and Oriented x 3. pleasant and cooperative. General Notes: Fortunately there does not appear to be any evidence of infection which is great news he's been tolerating the dressing changes without complication. Overall I'm very pleased with the progress that has been made up to this point. I did perform complete cauterization with silver nitrate today in order to help treat the hyper granular tissue hopefully this will be of benefit as well for the patient. In the past this has done very well in order to try and help with new epithelialization and a better wound bed. [place], [place] ([id number]) Integumentary (Hair, Skin) Wound #4 status is Open. Original cause of wound was Gradually  Appeared. The wound is located on the Left,Lateral Abdomen - Lower Quadrant. The wound measures 2.5cm length x 2.7cm width x 0.1cm depth; 5.301cm^2 area and 0.53cm^3 volume. There is no tunneling or undermining noted. There is a large amount of serosanguineous drainage noted. The wound margin is distinct with the outline attached to the wound base. There is medium (34-66%) pink granulation within the wound bed. There is a medium (34-66%) amount of necrotic tissue within  the wound bed including Eschar and Adherent Slough. Periwound temperature was noted as No Abnormality. The periwound has tenderness on palpation. Wound #5 status is Open. Original cause of wound was Gradually Appeared. The wound is located on the Left,Medial Upper Leg. The wound measures 0.7cm length x 0.7cm width x 0.1cm depth; 0.385cm^2 area and 0.038cm^3 volume. There is no tunneling or undermining noted. There is a large amount of serosanguineous drainage noted. The wound margin is distinct with the outline attached to the wound base. There is large (67-100%) red, hyper - granulation within the wound bed. There is no necrotic tissue within the wound bed. Periwound temperature was noted as No Abnormality. The periwound has tenderness on palpation. Wound #6 status is Open. Original cause of wound was Gradually Appeared. The wound is located on the Right,Anterior Upper Leg. The wound measures 0.8cm length x 1.3cm width x 0.1cm depth; 0.817cm^2 area and 0.082cm^3 volume. There is no tunneling or undermining noted. There is a large amount of purulent drainage noted. The wound margin is distinct with the outline attached to the wound base. There is large (67-100%) red, hyper - granulation within the wound bed. There is no necrotic tissue within the wound bed. Periwound temperature was noted as No Abnormality. The periwound has tenderness on palpation. Assessment Active Problems ICD-10 Other disorders of calcium  metabolism Non-pressure chronic ulcer of left thigh with fat layer exposed Non-pressure chronic ulcer of right thigh with fat layer exposed Essential (primary) hypertension Acute on chronic diastolic (congestive) heart failure Procedures Wound #5 Pre-procedure diagnosis of Wound #5 is a Calciphylaxis located on the Left,Medial Upper Leg . An CHEM CAUT GRANULATION TISS procedure was performed by STONE III, HOYT E., PA-C. Post procedure Diagnosis Wound #5: Same as Pre-Procedure Wound #6 Pre-procedure diagnosis of Wound #6 is a Calciphylaxis located on the Right,Anterior Upper Leg . An CHEM CAUT GRANULATION TISS procedure was performed by STONE III, HOYT E., PA-C. Post procedure Diagnosis Wound #6: Same as Pre-Procedure [name], [name] ([id number]) Verbal informed consent: was obtained for chemical cauterization of hypergranulation tissue with silver nitrate. Risk and benefits were discussed with patient. Patient verbalized understanding that chemical cauterization is beneficial for managing hypergranulation tissue, and understands how hypergranulation tissue can prevent proper wound healing. The risk of the procedure includes but is not limited to damage of surrounding normally healing tissue. Procedure: Procedure time: 8:25 AM Topical anesthetic in the form of benzocaine spray was applied prior to the start of the procedure Utilizing 2 silver nitrate sticks, the area of hypergranulation of the bilateral thighs was chemically cauterized. I informed the patient that the dark discoloration is a normal result of the silver nitrate product. Patient tolerated the procedure well with no discomfort. Plan Wound Cleansing: Wound #4 Left,Lateral Abdomen - Lower Quadrant: Clean wound with Normal Saline. Clean wound with Normal Saline. Anesthetic (add to Medication List): Wound #4 Left,Lateral Abdomen - Lower Quadrant: Topical Lidocaine 4% cream applied to wound bed prior to debridement (In Clinic  Only). Skin Barriers/Peri-Wound Care: Wound #4 Left,Lateral Abdomen - Lower Quadrant: Skin Prep Primary Wound Dressing: Wound #4 Left,Lateral Abdomen - Lower Quadrant: Xeroform Wound #5 Left,Medial Upper Leg: Hydrafera Blue Ready Transfer Wound #6 Right,Anterior Upper Leg: Hydrafera Blue Ready Transfer Secondary Dressing: Wound #4 Left,Lateral Abdomen - Lower Quadrant: Boardered Foam Dressing Wound #5 Left,Medial Upper Leg: Boardered Foam Dressing Wound #6 Right,Anterior Upper Leg: Boardered Foam Dressing Dressing Change Frequency: Wound #4 Left,Lateral Abdomen - Lower Quadrant: Change dressing every other day. Wound #5 Left,Medial  Upper Leg: Change dressing every other day. Wound #6 Right,Anterior Upper Leg: [name], [name] ([id number]) Change dressing every other day. Follow-up Appointments: Wound #4 Left,Lateral Abdomen - Lower Quadrant: Return Appointment in 1 week. Wound #5 Left,Medial Upper Leg: Return Appointment in 1 week. Wound #6 Right,Anterior Upper Leg: Return Appointment in 1 week. At this point I'm gonna suggest that we reinitiate treatment with the [facility] Dressing of the bilateral thigh ulcer is due to the fact that this seems to be better as far as patrolling the hyper granular tissue and the patient is in agreement the plan. We will separately see were things stand at follow-up in one weeks time. Please see above for specific wound care orders. We will see patient for re-evaluation in 1 week(s) here in the clinic. If anything worsens or changes patient will contact our office for additional recommendations. Electronic Signature(s) Signed: 02/18/2018 8:46:34 PM By: Worthy Keeler PA-C Entered By: Worthy Keeler on 02/18/2018 08:30:26 [place], [place] ([id number]) -------------------------------------------------------------------------------- ROS/PFSH Details Patient Name: [name] Date of Service: 02/18/2018 8:00 AM Medical Record Number:  [id number] Patient Account Number: [id number] Date of Birth/Sex: 1981-01-18 (37 y.o. M) Treating RN: Roger Shelter Primary Care Provider: Marco Collie Other Clinician: Referring Provider: Marco Collie Treating Provider/Extender: STONE III, HOYT Weeks in Treatment: 11 Information Obtained From Patient Wound History Do you currently have one or more open woundso Yes How many open wounds do you currently haveo 2 Approximately how long have you had your woundso 6 months How have you been treating your wound(s) until nowo triamcinalone Has your wound(s) ever healed and then re-openedo No Have you had any lab work done in the past montho No Have you tested positive for an antibiotic resistant organism (MRSA, VRE)o No Have you tested positive for osteomyelitis (bone infection)o No Have you had any tests for circulation on your legso No Constitutional Symptoms (General Health) Complaints and Symptoms: Negative for: Fever; Chills Hematologic/Lymphatic Medical History: Positive for: Anemia Negative for: Hemophilia; Human Immunodeficiency Virus; Lymphedema; Sickle Cell Disease Respiratory Complaints and Symptoms: No Complaints or Symptoms Medical History: Positive for: Asthma - history Negative for: Aspiration; Chronic Obstructive Pulmonary Disease (COPD); Pneumothorax; Sleep Apnea; Tuberculosis Cardiovascular Complaints and Symptoms: No Complaints or Symptoms Medical History: Positive for: Hypertension Gastrointestinal Medical History: Negative for: Cirrhosis ; Colitis; Crohnos; Hepatitis A; Hepatitis B; Hepatitis C Genitourinary [place], Pranit ([id number]) Medical History: Positive for: End Stage Renal Disease Immunological Medical History: Negative for: Lupus Erythematosus; Raynaudos; Scleroderma Musculoskeletal Medical History: Negative for: Gout; Rheumatoid Arthritis; Osteoarthritis; Osteomyelitis Neurologic Medical History: Negative for: Dementia;  Neuropathy Oncologic Medical History: Negative for: Received Chemotherapy; Received Radiation Psychiatric Complaints and Symptoms: No Complaints or Symptoms Immunizations Pneumococcal Vaccine: Received Pneumococcal Vaccination: Yes Implantable Devices Family and Social History Cancer: No; Diabetes: Yes - Maternal Grandparents; Heart Disease: Yes - Maternal Grandparents; Hereditary Spherocytosis: No; Hypertension: Yes - Maternal Grandparents; Kidney Disease: No; Lung Disease: No; Seizures: No; Stroke: Yes - Maternal Grandparents; Thyroid Problems: No; Tuberculosis: No; Never smoker; Marital Status - Single; Alcohol Use: Never; Drug Use: No History; Caffeine Use: Daily; Advanced Directives: No; Patient does not want information on Advanced Directives Physician Affirmation I have reviewed and agree with the above information. Electronic Signature(s) Signed: 02/18/2018 8:46:34 PM By: Worthy Keeler PA-C Signed: 02/19/2018 11:46:23 AM By: Roger Shelter Entered By: Worthy Keeler on 02/18/2018 08:28:21 [name], [name] ([id number]) -------------------------------------------------------------------------------- SuperBill Details Patient Name: [name] Date of Service: 02/18/2018 Medical Record Number: [id number] Patient Account Number: [id number] Date of Birth/Sex:  02-01-1981 (37 y.o. 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 L97.112 Non-pressure chronic ulcer of right thigh with fat layer exposed I10 Essential (primary) hypertension I50.33 Acute on chronic diastolic (congestive) heart failure Facility Procedures CPT4 Code: 18563149 Description: 70263 - CHEM CAUT GRANULATION TISS  ICD-10 Diagnosis Description L97.122 Non-pressure chronic ulcer of left thigh with fat layer exp L97.112 Non-pressure chronic ulcer of right thigh with fat layer ex Modifier: osed posed Quantity: 2 Physician Procedures CPT4 Code: 7858850 Description: 27741 - WC PHYS CHEM CAUT GRAN TISSUE ICD-10 Diagnosis Description O87.867 Non-pressure chronic ulcer of left thigh with fat layer expo L97.112 Non-pressure chronic ulcer of right thigh with fat layer exp Modifier: sed osed Quantity: 2 Electronic Signature(s) Signed: 02/18/2018 8:46:34 PM By: Worthy Keeler PA-C Entered By: Worthy Keeler on 02/18/2018 08:30:53

## 2018-02-23 ENCOUNTER — Other Ambulatory Visit: Payer: Self-pay

## 2018-02-23 ENCOUNTER — Emergency Department: Payer: Medicaid Other

## 2018-02-23 ENCOUNTER — Inpatient Hospital Stay
Admission: EM | Admit: 2018-02-23 | Discharge: 2018-02-24 | DRG: 314 | Disposition: A | Discharge status: Skilled Nursing Facility | Payer: Medicaid Other | Source: Ambulatory Visit | Attending: Internal Medicine | Admitting: Internal Medicine

## 2018-02-23 DIAGNOSIS — Z6841 Body Mass Index (BMI) 40.0 and over, adult: Secondary | ICD-10-CM

## 2018-02-23 DIAGNOSIS — Z885 Allergy status to narcotic agent status: Secondary | ICD-10-CM | POA: Diagnosis not present

## 2018-02-23 DIAGNOSIS — N186 End stage renal disease: Secondary | ICD-10-CM | POA: Diagnosis present

## 2018-02-23 DIAGNOSIS — D631 Anemia in chronic kidney disease: Secondary | ICD-10-CM | POA: Diagnosis present

## 2018-02-23 DIAGNOSIS — Z7722 Contact with and (suspected) exposure to environmental tobacco smoke (acute) (chronic): Secondary | ICD-10-CM | POA: Diagnosis present

## 2018-02-23 DIAGNOSIS — Z79899 Other long term (current) drug therapy: Secondary | ICD-10-CM | POA: Diagnosis not present

## 2018-02-23 DIAGNOSIS — N2581 Secondary hyperparathyroidism of renal origin: Secondary | ICD-10-CM | POA: Diagnosis present

## 2018-02-23 DIAGNOSIS — Z7901 Long term (current) use of anticoagulants: Secondary | ICD-10-CM

## 2018-02-23 DIAGNOSIS — T829XXA Unspecified complication of cardiac and vascular prosthetic device, implant and graft, initial encounter: Secondary | ICD-10-CM | POA: Diagnosis present

## 2018-02-23 DIAGNOSIS — Y832 Surgical operation with anastomosis, bypass or graft as the cause of abnormal reaction of the patient, or of later complication, without mention of misadventure at the time of the procedure: Secondary | ICD-10-CM | POA: Diagnosis present

## 2018-02-23 DIAGNOSIS — Z992 Dependence on renal dialysis: Secondary | ICD-10-CM | POA: Diagnosis not present

## 2018-02-23 DIAGNOSIS — I2510 Atherosclerotic heart disease of native coronary artery without angina pectoris: Secondary | ICD-10-CM | POA: Diagnosis present

## 2018-02-23 DIAGNOSIS — T82510A Breakdown (mechanical) of surgically created arteriovenous fistula, initial encounter: Principal | ICD-10-CM | POA: Diagnosis present

## 2018-02-23 DIAGNOSIS — L97122 Non-pressure chronic ulcer of left thigh with fat layer exposed: Secondary | ICD-10-CM | POA: Diagnosis present

## 2018-02-23 DIAGNOSIS — R402413 Glasgow coma scale score 13-15, at hospital admission: Secondary | ICD-10-CM | POA: Diagnosis present

## 2018-02-23 DIAGNOSIS — I110 Hypertensive heart disease with heart failure: Secondary | ICD-10-CM | POA: Diagnosis not present

## 2018-02-23 DIAGNOSIS — I5033 Acute on chronic diastolic (congestive) heart failure: Secondary | ICD-10-CM | POA: Diagnosis not present

## 2018-02-23 DIAGNOSIS — Z9989 Dependence on other enabling machines and devices: Secondary | ICD-10-CM | POA: Diagnosis not present

## 2018-02-23 DIAGNOSIS — I5030 Unspecified diastolic (congestive) heart failure: Secondary | ICD-10-CM | POA: Diagnosis present

## 2018-02-23 DIAGNOSIS — I82511 Chronic embolism and thrombosis of right femoral vein: Secondary | ICD-10-CM | POA: Diagnosis present

## 2018-02-23 DIAGNOSIS — Z87441 Personal history of nephrotic syndrome: Secondary | ICD-10-CM

## 2018-02-23 DIAGNOSIS — I132 Hypertensive heart and chronic kidney disease with heart failure and with stage 5 chronic kidney disease, or end stage renal disease: Secondary | ICD-10-CM | POA: Diagnosis present

## 2018-02-23 DIAGNOSIS — I272 Pulmonary hypertension, unspecified: Secondary | ICD-10-CM | POA: Diagnosis present

## 2018-02-23 DIAGNOSIS — F329 Major depressive disorder, single episode, unspecified: Secondary | ICD-10-CM | POA: Diagnosis present

## 2018-02-23 DIAGNOSIS — G4730 Sleep apnea, unspecified: Secondary | ICD-10-CM | POA: Diagnosis present

## 2018-02-23 DIAGNOSIS — F411 Generalized anxiety disorder: Secondary | ICD-10-CM | POA: Diagnosis present

## 2018-02-23 DIAGNOSIS — Y712 Prosthetic and other implants, materials and accessory cardiovascular devices associated with adverse incidents: Secondary | ICD-10-CM | POA: Diagnosis present

## 2018-02-23 DIAGNOSIS — T82590A Other mechanical complication of surgically created arteriovenous fistula, initial encounter: Secondary | ICD-10-CM | POA: Diagnosis present

## 2018-02-23 DIAGNOSIS — E039 Hypothyroidism, unspecified: Secondary | ICD-10-CM | POA: Diagnosis present

## 2018-02-23 DIAGNOSIS — L97112 Non-pressure chronic ulcer of right thigh with fat layer exposed: Secondary | ICD-10-CM | POA: Diagnosis not present

## 2018-02-23 LAB — CBC WITH DIFFERENTIAL/PLATELET
BASOS ABS: 0.1 10*3/uL (ref 0–0.1)
BASOS PCT: 2 %
EOS ABS: 0.1 10*3/uL (ref 0–0.7)
Eosinophils Relative: 2 %
HEMATOCRIT: 30.2 % — AB (ref 40.0–52.0)
Hemoglobin: 10.9 g/dL — ABNORMAL LOW (ref 13.0–18.0)
Lymphocytes Relative: 40 %
Lymphs Abs: 2.6 10*3/uL (ref 1.0–3.6)
MCH: 31.2 pg (ref 26.0–34.0)
MCHC: 36 g/dL (ref 32.0–36.0)
MCV: 86.7 fL (ref 80.0–100.0)
MONO ABS: 0.7 10*3/uL (ref 0.2–1.0)
Monocytes Relative: 10 %
NEUTROS ABS: 3 10*3/uL (ref 1.4–6.5)
NEUTROS PCT: 46 %
Platelets: 250 10*3/uL (ref 150–440)
RBC: 3.48 MIL/uL — ABNORMAL LOW (ref 4.40–5.90)
RDW: 14.6 % — AB (ref 11.5–14.5)
WBC: 6.5 10*3/uL (ref 3.8–10.6)

## 2018-02-23 LAB — BASIC METABOLIC PANEL
ANION GAP: 12 (ref 5–15)
BUN: 68 mg/dL — ABNORMAL HIGH (ref 6–20)
CALCIUM: 8.4 mg/dL — AB (ref 8.9–10.3)
CO2: 33 mmol/L — ABNORMAL HIGH (ref 22–32)
Chloride: 96 mmol/L — ABNORMAL LOW (ref 98–111)
Creatinine, Ser: 7.65 mg/dL — ABNORMAL HIGH (ref 0.61–1.24)
GFR, EST AFRICAN AMERICAN: 10 mL/min — AB (ref >60)
GFR, EST NON AFRICAN AMERICAN: 8 mL/min — AB (ref >60)
GLUCOSE: 89 mg/dL (ref 70–99)
Potassium: 4.6 mmol/L (ref 3.5–5.1)
Sodium: 141 mmol/L (ref 135–145)

## 2018-02-23 LAB — MRSA PCR SCREENING: MRSA by PCR: POSITIVE — AB

## 2018-02-23 LAB — PHOSPHORUS: Phosphorus: 4.4 mg/dL (ref 2.5–4.6)

## 2018-02-23 MED ORDER — POLYETHYLENE GLYCOL 3350 17 G PO PACK: 17.0000 g | PACK | Freq: Every day | ORAL | Status: DC | PRN

## 2018-02-23 MED ORDER — HEPARIN SODIUM (PORCINE) 5000 UNIT/ML IJ SOLN
5000.0000 [IU] | Freq: Three times a day (TID) | INTRAMUSCULAR | Status: DC
  Administered 2018-02-23 – 2018-02-24 (×2): 5000 [IU] via SUBCUTANEOUS
  Filled 2018-02-23 (×2): qty 1

## 2018-02-23 MED ORDER — HYDROCODONE-ACETAMINOPHEN 5-325 MG PO TABS: 1.0000 | ORAL_TABLET | Freq: Four times a day (QID) | ORAL | Status: DC | PRN

## 2018-02-23 MED ORDER — LIDOCAINE HCL (PF) 1 % IJ SOLN
5.0000 mL | INTRAMUSCULAR | Status: DC | PRN
  Filled 2018-02-23: qty 5

## 2018-02-23 MED ORDER — SERTRALINE HCL 50 MG PO TABS
25.0000 mg | ORAL_TABLET | Freq: Every day | ORAL | Status: DC
  Administered 2018-02-23 – 2018-02-24 (×2): 25 mg via ORAL
  Filled 2018-02-23 (×2): qty 1

## 2018-02-23 MED ORDER — SODIUM CHLORIDE 0.9 % IV SOLN: 100.0000 mL | INTRAVENOUS | Status: DC | PRN

## 2018-02-23 MED ORDER — LEVOTHYROXINE SODIUM 50 MCG PO TABS: 75.0000 ug | ORAL_TABLET | Freq: Every day | ORAL | Status: DC

## 2018-02-23 MED ORDER — CHLORHEXIDINE GLUCONATE CLOTH 2 % EX PADS
6.0000 | MEDICATED_PAD | Freq: Every day | CUTANEOUS | Status: DC
  Administered 2018-02-24: 6 via TOPICAL

## 2018-02-23 MED ORDER — LOSARTAN POTASSIUM 50 MG PO TABS
100.0000 mg | ORAL_TABLET | Freq: Every day | ORAL | Status: DC
  Administered 2018-02-23: 100 mg via ORAL
  Filled 2018-02-23: qty 2

## 2018-02-23 MED ORDER — ONDANSETRON HCL 4 MG PO TABS: 4.0000 mg | ORAL_TABLET | Freq: Four times a day (QID) | ORAL | Status: DC | PRN

## 2018-02-23 MED ORDER — DOCUSATE SODIUM 100 MG PO CAPS
100.0000 mg | ORAL_CAPSULE | Freq: Two times a day (BID) | ORAL | Status: DC
  Administered 2018-02-23: 100 mg via ORAL
  Filled 2018-02-23: qty 1

## 2018-02-23 MED ORDER — ONDANSETRON HCL 4 MG/2ML IJ SOLN: 4.0000 mg | Freq: Four times a day (QID) | INTRAMUSCULAR | Status: DC | PRN

## 2018-02-23 MED ORDER — ALTEPLASE 2 MG IJ SOLR: 2.0000 mg | Freq: Once | INTRAMUSCULAR | Status: DC | PRN

## 2018-02-23 MED ORDER — RENA-VITE PO TABS
1.0000 | ORAL_TABLET | Freq: Every day | ORAL | Status: DC
  Administered 2018-02-23: 1 via ORAL
  Filled 2018-02-23: qty 1

## 2018-02-23 MED ORDER — ONDANSETRON HCL 4 MG/2ML IJ SOLN
4.0000 mg | Freq: Four times a day (QID) | INTRAMUSCULAR | Status: DC | PRN
Start: 2018-02-23 — End: 2018-02-24

## 2018-02-23 MED ORDER — METOPROLOL TARTRATE 25 MG PO TABS
25.0000 mg | ORAL_TABLET | Freq: Two times a day (BID) | ORAL | Status: DC
  Administered 2018-02-23: 25 mg via ORAL
  Filled 2018-02-23: qty 1

## 2018-02-23 MED ORDER — ACETAMINOPHEN 325 MG PO TABS: 650.0000 mg | ORAL_TABLET | Freq: Four times a day (QID) | ORAL | Status: DC | PRN

## 2018-02-23 MED ORDER — PENTAFLUOROPROP-TETRAFLUOROETH EX AERO
1.0000 "application " | INHALATION_SPRAY | CUTANEOUS | Status: DC | PRN
  Filled 2018-02-23: qty 30

## 2018-02-23 MED ORDER — ACETAMINOPHEN 650 MG RE SUPP: 650.0000 mg | Freq: Four times a day (QID) | RECTAL | Status: DC | PRN

## 2018-02-23 MED ORDER — SEVELAMER CARBONATE 800 MG PO TABS
800.0000 mg | ORAL_TABLET | Freq: Three times a day (TID) | ORAL | Status: DC
  Administered 2018-02-23 – 2018-02-24 (×2): 800 mg via ORAL
  Filled 2018-02-23 (×2): qty 1

## 2018-02-23 MED ORDER — FERROUS SULFATE 325 (65 FE) MG PO TABS
325.0000 mg | ORAL_TABLET | Freq: Every day | ORAL | Status: DC
  Administered 2018-02-23: 325 mg via ORAL
  Filled 2018-02-23: qty 1

## 2018-02-23 MED ORDER — LIDOCAINE-PRILOCAINE 2.5-2.5 % EX CREA
1.0000 "application " | TOPICAL_CREAM | CUTANEOUS | Status: DC | PRN
  Filled 2018-02-23: qty 5

## 2018-02-23 MED ORDER — BUPROPION HCL ER (SR) 150 MG PO TB12
150.0000 mg | ORAL_TABLET | Freq: Two times a day (BID) | ORAL | Status: DC
  Administered 2018-02-23 – 2018-02-24 (×2): 150 mg via ORAL
  Filled 2018-02-23 (×4): qty 1

## 2018-02-23 MED ORDER — TORSEMIDE 20 MG PO TABS
40.0000 mg | ORAL_TABLET | ORAL | Status: DC
  Administered 2018-02-23 – 2018-02-24 (×2): 40 mg via ORAL
  Filled 2018-02-23 (×2): qty 2

## 2018-02-23 MED ORDER — GABAPENTIN 100 MG PO CAPS
100.0000 mg | ORAL_CAPSULE | ORAL | Status: DC
  Administered 2018-02-23: 100 mg via ORAL
  Filled 2018-02-23: qty 1

## 2018-02-23 MED ORDER — HEPARIN SODIUM (PORCINE) 1000 UNIT/ML DIALYSIS: 1000.0000 [IU] | INTRAMUSCULAR | Status: DC | PRN

## 2018-02-23 MED ORDER — GABAPENTIN 100 MG PO CAPS: 100.0000 mg | ORAL_CAPSULE | ORAL | Status: DC

## 2018-02-23 MED ORDER — GABAPENTIN 300 MG PO CAPS: 300.0000 mg | ORAL_CAPSULE | ORAL | Status: DC

## 2018-02-23 MED ORDER — OCUVITE-LUTEIN PO CAPS
1.0000 | ORAL_CAPSULE | Freq: Every day | ORAL | Status: DC
  Administered 2018-02-24: 1 via ORAL
  Filled 2018-02-23 (×2): qty 1

## 2018-02-23 NOTE — ED Provider Notes (Addendum)
[facility] Emergency Department Provider Note  ____________________________________________   I have reviewed the triage vital signs and the nursing notes. Where available I have reviewed prior notes and, if possible and indicated, outside hospital notes.    HISTORY  Chief Complaint Vascular Access Problem    HPI [name] is a 37 y.o. male with a history of nephrotic syndrome, he states that his dialysis graft which has been there for several months in the left upper extremity, is somehow dysfunctional this morning.  He did go to dialysis as scheduled on Thursday and then returned again as scheduled today.  He states he was a few liters over his dry weight on Thursday today when he went and they stuck his fistula several times they were able to get blood but it was painful and they stopped and they sent him in here worrying that they had "infiltrated it".  Patient has no ongoing symptoms or pain.  He denies chest pain or shortness of breath, he does not symptomatic in any way.  He states that he is here at this time to have his fistula "evaluated".  He did not have dialysis this morning.     Past Medical History:  Diagnosis Date  . Anemia   . CKD (chronic kidney disease)   . 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 Depression   . Diastolic CHF (HCC)   . Generalized anxiety disorder   . Hyperlipidemia   . Hypertension   . Hypertension   . Hypothyroidism   . Lymph edema   . Lymphedema   . Muscle weakness (generalized)   . Nephrotic syndrome   . Obesity   . Pleural effusion   . Pulmonary hypertension (HCC)   . Respiratory failure with hypoxia (HCC)   . Sleep apnea    CPAP  . Vitamin C deficiency     Patient Active Problem List   Diagnosis Date Noted  . PAD (peripheral artery disease) (HCC) 10/22/2017  . ESRD on dialysis (HCC) 08/22/2017  . Essential hypertension 08/22/2017  . Protein-calorie malnutrition, severe 04/30/2017  . 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 Acute on chronic respiratory failure with hypoxia and hypercapnia (HCC)   . Pleural effusion, bilateral   . Postprocedural pneumothorax   . ARF (acute renal failure) (HCC) 03/30/2017    Past Surgical History:  Procedure Laterality Date  . AV FISTULA PLACEMENT Left 10/05/2017   Procedure: INSERTION OF ARTERIOVENOUS (AV) GORE-TEX GRAFT ARM ( BRACHIAL AXILLARY );  Surgeon: Renford Dills, MD;  Location: [facility] ORS;  Service: Vascular;  Laterality: Left;  . CARDIAC CATHETERIZATION    . DIALYSIS/PERMA CATHETER INSERTION N/A 04/02/2017   Procedure: DIALYSIS/PERMA CATHETER INSERTION;  Surgeon: Annice Needy, MD;  Location: [facility] INVASIVE CV LAB;  Service: Cardiovascular;  Laterality: N/A;  . DIALYSIS/PERMA CATHETER INSERTION N/A 04/25/2017   Procedure: DIALYSIS/PERMA CATHETER INSERTION;  Surgeon: Annice Needy, MD;  Location: [facility] INVASIVE CV LAB;  Service: Cardiovascular;  Laterality: N/A;  . DIALYSIS/PERMA CATHETER REMOVAL N/A 11/19/2017   Procedure: DIALYSIS/PERMA CATHETER REMOVAL;  Surgeon: Annice Needy, MD;  Location: [facility] INVASIVE CV LAB;  Service: Cardiovascular;  Laterality: N/A;  . UPPER GI ENDOSCOPY      Prior to Admission medications   Medication Sig Start Date End Date Taking? Authorizing Provider  amLODipine (NORVASC) 5 MG tablet Take 5 mg by mouth daily.    [provider]  apixaban (ELIQUIS) 5 MG TABS tablet Take 1  tablet (5 mg total) by mouth 2 (two) times daily. 05/02/17   Alford Highland, MD  buPROPion (WELLBUTRIN SR) 150 MG 12 hr tablet Take 150 mg by mouth 2 (two) times daily.    [provider]  collagenase (SANTYL) ointment Apply 1 application topically every evening. Apply to left thigh front and right thigh back every evening shift for healing    [provider]  docusate sodium (COLACE) 100 MG capsule Take 1 capsule (100 mg total) by mouth 2 (two) times daily. 04/13/17   Shaune Pollack, MD  fentaNYL (DURAGESIC - DOSED  MCG/HR) 12 MCG/HR Place 1 patch (12.5 mcg total) onto the skin every 3 (three) days. Patient not taking: Reported on 11/08/2017 05/04/17   Alford Highland, MD  ferrous sulfate 325 (65 FE) MG tablet Take 1 tablet (325 mg total) by mouth daily at 12 noon. Patient taking differently: Take 325 mg by mouth daily.  04/14/17   Shaune Pollack, MD  finasteride (PROSCAR) 5 MG tablet Take 5 mg by mouth daily.     [provider]  gabapentin (NEURONTIN) 100 MG capsule Take 100-300 mg by mouth See admin instructions. Take 100 mg by mouth in the evening on Tuesday, Thursday and Saturday. Take 300 mg by mouth in the evening on all other days    [provider]  HYDROcodone-acetaminophen (NORCO) 5-325 MG tablet Take 1-2 tablets by mouth every 6 (six) hours as needed for moderate pain or severe pain. 10/05/17   Schnier, Latina Craver, MD  levothyroxine (SYNTHROID, LEVOTHROID) 75 MCG tablet Take 75 mcg by mouth daily before breakfast.    [provider]  losartan (COZAAR) 100 MG tablet Take 100 mg by mouth at bedtime.     [provider]  metoprolol tartrate (LOPRESSOR) 25 MG tablet Take 25 mg by mouth 2 (two) times daily.    [provider]  multivitamin (RENA-VIT) TABS tablet Take 1 tablet by mouth at bedtime. Patient not taking: Reported on 11/08/2017 04/13/17   Shaune Pollack, MD  Nutritional Supplements (FEEDING SUPPLEMENT, NEPRO CARB STEADY,) LIQD Take 237 mLs by mouth 3 (three) times daily between meals. Patient not taking: Reported on 11/08/2017 05/02/17   Alford Highland, MD  ondansetron [facility]) 4 MG/2ML SOLN injection Inject 4 mg into the muscle every 6 (six) hours as needed for nausea or vomiting.    [provider]  oxycodone (OXY-IR) 5 MG capsule 5 mg every four hours for moderate pain; 10 mg every four hours for sever pain Patient not taking: Reported on 11/08/2017 05/02/17   Alford Highland, MD  sertraline (ZOLOFT) 25 MG tablet Take 25 mg by mouth daily.     [provider]  sevelamer carbonate (RENVELA) 800 MG tablet Take 800 mg by mouth 3 (three) times daily with meals.     [provider]  torsemide (DEMADEX) 20 MG tablet Take 2 tablets (40 mg total) by mouth daily. 05/02/17   Alford Highland, MD    Allergies Morphine and related  Family History  Problem Relation Age of Onset  . Hypertension Mother     Social History Social History   Tobacco Use  . Smoking status: Passive Smoke Exposure - Never Smoker  . Smokeless tobacco: Never Used  Substance Use Topics  . Alcohol use: No  . Drug use: No    Review of Systems Constitutional: No fever/chills Eyes: No visual changes. ENT: No sore throat. No stiff neck no neck pain Cardiovascular: Denies chest pain. Respiratory: Denies shortness of  breath. Gastrointestinal:   no vomiting.  No diarrhea.  No constipation. Genitourinary: Negative for dysuria. Musculoskeletal: Negative lower extremity swelling Skin: Negative for rash. Neurological: Negative for severe headaches, focal weakness or numbness.   ____________________________________________   PHYSICAL EXAM:  VITAL SIGNS: ED Triage Vitals  Enc Vitals Group     BP      Pulse      Resp      Temp      Temp src      SpO2      Weight      Height      Head Circumference      Peak Flow      Pain Score      Pain Loc      Pain Edu?      Excl. in GC?     Constitutional: Alert and oriented. Well appearing and in no acute distress. Cardiovascular: Normal rate, regular rhythm. Grossly normal heart sounds.  Good peripheral circulation. Respiratory: Normal respiratory effort.  No retractions. Lungs CTAB. Abdominal: Soft and nontender. No distention. No guarding no rebound Musculoskeletal: No lower extremity tenderness, no upper extremity tenderness. No joint effusions, no DVT signs strong distal pulses no edema Neurologic:  Normal speech and language. No gross focal neurologic deficits are appreciated.  Skin:  Skin is  warm, dry and intact. No rash noted.  Fistula in the left upper arm has a good bruit and thrill.  No evidence of infection or significant edema or swelling arm is well-perfused distally Psychiatric: Mood and affect are normal. Speech and behavior are normal.  ____________________________________________   LABS (all labs ordered are listed, but only abnormal results are displayed)  Labs Reviewed  BASIC METABOLIC PANEL  CBC WITH DIFFERENTIAL/PLATELET    Pertinent labs  results that were available during my care of the patient were reviewed by me and considered in my medical decision making (see chart for details). ____________________________________________  EKG  I personally interpreted any EKGs ordered by me or triage Sinus rhythm rate 51 bpm no acute ST elevation or depression ossific ST changes no peaked T waves ____________________________________________  RADIOLOGY  Pertinent labs & imaging results that were available during my care of the patient were reviewed by me and considered in my medical decision making (see chart for details). If possible, patient and/or family made aware of any abnormal findings.  No results found. ____________________________________________    PROCEDURES  Procedure(s) performed: None  Procedures  Critical Care performed: None  ____________________________________________   INITIAL IMPRESSION / ASSESSMENT AND PLAN / ED COURSE  Pertinent labs & imaging results that were available during my care of the patient were reviewed by me and considered in my medical decision making (see chart for details).  Patient here unable to get dialysis this morning because of problems with his fistula which seems to be intact to me, will check basic blood work to make sure his potassium is not dangerously high as apparently did not quite complete dialysis on Thursday, we will get a chest x-ray because he does have a history of pulmonary edema although his  sats are reassuring, he has not complained of shortness of breath I do not auscultate significant edema I will be good to get a baseline for him.  Afterwards we will talk to nephrology and see what they want me to do  ----------------------------------------- 9:26 AM on 02/23/2018 -----------------------------------------  Discussed with Dr. Cherylann Ratel, patient's arm is still well-appearing, does not appear to be significantly swollen although  perhaps slightly swollen,, good thrill and bruit, patient using a cell phone in no acute distress, heart rate is 62 at this time blood pressure remains stable, however, he is 6 L over his dry weight and cannot wait until Monday per Dr. Lourdes SledgeLatif, therefore, he does recommend admission.  He was discussed with vascular surgery.      ____________________________________________   FINAL CLINICAL IMPRESSION(S) / ED DIAGNOSES  Final diagnoses:  None      This chart was dictated using voice recognition software.  Despite best efforts to proofread,  errors can occur which can change meaning.      Jeanmarie PlantMcShane, Jenisa Monty A, MD 02/23/18 [id number]    Jeanmarie PlantMcShane, Alyla Pietila A, MD 02/23/18 [id number]    Jeanmarie PlantMcShane, Jasmia Angst A, MD 02/23/18 [phone]

## 2018-02-23 NOTE — Progress Notes (Signed)
[facility]  ROUNDING NOTE   Subjective:  Patient well-known to us as we follow him for outpatient hemodialysis on Tuesday, Thursday, Saturday. He underwent dialysis yesterday but went to kilos over his dry weight. He is estimated dry weight is 110 kg however he went out at 112 on Thursday. He return for hemodialysis today but successful cannulation was unable to be performed. He was stuck a total of 4 times and on the last time it was felt that there may have been mild infiltration. Patient was successfully cannulated here in the hospital and blood flow rate is currently 350 with a UF target of 3.5 kg.   Objective:  Vital signs in last 24 hours:  Temp:  [97.8 F (36.6 C)-98.3 F (36.8 C)] 97.8 F (36.6 C) (07/13 1052) Pulse Rate:  [58] 58 (07/13 1052) Resp:  [16] 16 (07/13 1052) BP: (113-130)/(69-72) 113/69 (07/13 1052) SpO2:  [96 %-98 %] 96 % (07/13 1052) Weight:  [133 kg (293 lb 3.4 oz)] 133 kg (293 lb 3.4 oz) (07/13 0816)  Weight change:  Filed Weights   02/23/18 0816  Weight: 133 kg (293 lb 3.4 oz)    Intake/Output: No intake/output data recorded.   Intake/Output this shift:  No intake/output data recorded.  Physical Exam: General: No acute distress  Head: Normocephalic, atraumatic. Moist oral mucosal membranes  Eyes: Anicteric  Neck: Supple, trachea midline  Lungs:  Clear to auscultation, normal effort  Heart: S1S2 no rubs  Abdomen:  Soft, nontender, bowel sounds present  Extremities: Trace peripheral edema.  Neurologic: Awake, alert, following commands  Skin: No lesions  Access: LUE AVF with bruit and thrill.    Basic Metabolic Panel: Recent Labs  Lab 02/23/18 0826  NA 141  K 4.6  CL 96*  CO2 33*  GLUCOSE 89  BUN 68*  CREATININE 7.65*  CALCIUM 8.4*    Liver Function Tests: No results for input(s): AST, ALT, ALKPHOS, BILITOT, PROT, ALBUMIN in the last 168 hours. No results for input(s): LIPASE, AMYLASE in the last 168 hours. No  results for input(s): AMMONIA in the last 168 hours.  CBC: Recent Labs  Lab 02/23/18 0826  WBC 6.5  NEUTROABS 3.0  HGB 10.9*  HCT 30.2*  MCV 86.7  PLT 250    Cardiac Enzymes: No results for input(s): CKTOTAL, CKMB, CKMBINDEX, TROPONINI in the last 168 hours.  BNP: Invalid input(s): POCBNP  CBG: No results for input(s): GLUCAP in the last 168 hours.  Microbiology: Results for orders placed or performed during the hospital encounter of 08/31/17  Surgical pcr screen     Status: Abnormal   Collection Time: 08/31/17  2:15 PM  Result Value Ref Range Status   MRSA, PCR POSITIVE (A) NEGATIVE Final    Comment: RESULT CALLED TO, READ BACK BY AND VERIFIED WITH: MELANIE HEBERT 08/31/17 @ 1629  MLK    Staphylococcus aureus POSITIVE (A) NEGATIVE Final    Comment: (NOTE) The Xpert SA Assay (FDA approved for NASAL specimens in patients 36 years of age and older), is one component of a comprehensive surveillance program. It is not intended to diagnose infection nor to guide or monitor treatment. Performed at [facility], [address] [place], [place] [address]     Coagulation Studies: No results for input(s): LABPROT, INR in the last 72 hours.  Urinalysis: No results for input(s): COLORURINE, LABSPEC, PHURINE, GLUCOSEU, HGBUR, BILIRUBINUR, KETONESUR, PROTEINUR, UROBILINOGEN, NITRITE, LEUKOCYTESUR in the last 72 hours.  Invalid input(s): APPERANCEUR    Imaging:  Dg Chest 2 View  Result Date: 02/23/2018 CLINICAL DATA:  Missed dialysis. EXAM: CHEST - 2 VIEW COMPARISON:  06/23/2017 FINDINGS: Normal heart size. Decreased lung volumes. Small bilateral pleural effusions are identified. No interstitial edema. No airspace opacities. IMPRESSION: 1. Small bilateral pleural effusions. Electronically Signed   By: Signa Kell M.D.   On: 02/23/2018 09:11     Medications:    . buPROPion  150 mg Oral BID  . docusate sodium  100 mg Oral BID  . ferrous sulfate  325 mg Oral  Q1200  . gabapentin  100 mg Oral Once per day on Tue Thu Sat   And  . [START ON 02/24/2018] gabapentin  300 mg Oral Once per day on Sun Mon Wed Fri  . heparin  5,000 Units Subcutaneous Q8H  . [START ON 02/24/2018] levothyroxine  75 mcg Oral QAC breakfast  . losartan  100 mg Oral QHS  . metoprolol tartrate  25 mg Oral BID  . multivitamin  1 tablet Oral QHS  . multivitamin-lutein  1 capsule Oral Daily  . sertraline  25 mg Oral Daily  . sevelamer carbonate  800 mg Oral TID WC  . torsemide  40 mg Oral Once per day on Sun Tue Thu Sat   acetaminophen **OR** acetaminophen, HYDROcodone-acetaminophen, ondansetron **OR** ondansetron (ZOFRAN) IV, ondansetron, polyethylene glycol  Assessment/ Plan:  37 y.o. male with hypertension, morbid obesity, pulmonary hypertension, hypothyroidism, hyperlipidemia, depression/anxiety, coronary artery disease, congestive heart failure  [facility] 110kg.  1.  ESRD on HD TTS.  Patient had clots pulled from his access earlier this a.m.  We have now cannulated the patient and he is tolerating dialysis well.  Blood flow rate is currently 350.  We have set up an ultrafiltration target of 3 kg today.  We will likely need to reevaluate the patient for dialysis tomorrow as well as he is still 6 kg over his dry weight.  2.  Complication dialysis device.  As an outpatient his dialysis access was stuck 4 times without success.  Clots were pulled from his access during these attempts.  Therefore we recommend a fistulogram on Monday.  3.  Anemia of chronic kidney disease.  Patient receives Mircera as an outpatient.  Hold off on Epogen for now.  4.  Secondary hyperparathyroidism.  We will maintain the patient on Renvela 800 mg p.o. 3 times daily with meals and check serum phosphorus today.  5.  Thanks for consultation.   LOS: 0 Suella Cogar 7/13/201912:44 PM

## 2018-02-23 NOTE — Progress Notes (Signed)
This note also relates to the following rows which could not be included: Pulse Rate - Cannot attach notes to unvalidated device data Resp - Cannot attach notes to unvalidated device data BP - Cannot attach notes to unvalidated device data  Hd completed  

## 2018-02-23 NOTE — Progress Notes (Signed)
Pre dialysis assessment 

## 2018-02-23 NOTE — Progress Notes (Signed)
This note also relates to the following rows which could not be included: Pulse Rate - Cannot attach notes to unvalidated device data Resp - Cannot attach notes to unvalidated device data BP - Cannot attach notes to unvalidated device data  Hd started  

## 2018-02-23 NOTE — ED Triage Notes (Signed)
Pt arrives via [facility] from dialysis. Pt states staff unable to access port for dialysis this morning. Pt is a dialysis pt d/t kidney failure. Physician bedside

## 2018-02-23 NOTE — Progress Notes (Signed)
Patient returned from dialysis. Report given, 3L removed. VSS. Skin assessment completed. Wounds cleaned and redressed. A&O x4.

## 2018-02-23 NOTE — H&P (Signed)
[facility] -  at [facility]   PATIENT NAME: [name]    MR#:  [id number]  DATE OF BIRTH:  [birth year]  DATE OF ADMISSION:  02/23/2018  PRIMARY CARE PHYSICIAN: Abner Greenspan, MD   REQUESTING/REFERRING PHYSICIAN: Dr Alphonzo Lemmings  CHIEF COMPLAINT:   Patient came in from dialysis due to  malfunctioning left arm fistula HISTORY OF PRESENT ILLNESS:  [name]  is a 37 y.o. male with a known history of end-stage renal disease due to nephrotic syndrome on hemodialysis from September 2018, diastolic congestive heart failure comes to the emergency room date of malfunctioning left AV fistula at dialysis today. Patient says his left arm is hurting since they tried several times to get blood return . Came to the emergency room now admitted for evaluation of dialysis access.  Last dialysis was on Thursday without any difficulty. Patient denies any shortness of breath or chest pain.  PAST MEDICAL HISTORY:   Past Medical History:  Diagnosis Date  . Anemia   . CKD (chronic kidney disease)   . 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 Depression   . Diastolic CHF (HCC)   . Generalized anxiety disorder   . Hyperlipidemia   . Hypertension   . Hypertension   . Hypothyroidism   . Lymph edema   . Lymphedema   . Muscle weakness (generalized)   . Nephrotic syndrome   . Obesity   . Pleural effusion   . Pulmonary hypertension (HCC)   . Respiratory failure with hypoxia (HCC)   . Sleep apnea    CPAP  . Vitamin C deficiency     PAST SURGICAL HISTOIRY:   Past Surgical History:  Procedure Laterality Date  . AV FISTULA PLACEMENT Left 10/05/2017   Procedure: INSERTION OF ARTERIOVENOUS (AV) GORE-TEX GRAFT ARM ( BRACHIAL AXILLARY );  Surgeon: Renford Dills, MD;  Location: [facility] ORS;  Service: Vascular;  Laterality: Left;  . CARDIAC CATHETERIZATION    . DIALYSIS/PERMA CATHETER INSERTION N/A 04/02/2017   Procedure: DIALYSIS/PERMA CATHETER INSERTION;  Surgeon: Annice Needy, MD;  Location: [facility] INVASIVE CV LAB;  Service: Cardiovascular;  Laterality: N/A;  . DIALYSIS/PERMA CATHETER INSERTION N/A 04/25/2017   Procedure: DIALYSIS/PERMA CATHETER INSERTION;  Surgeon: Annice Needy, MD;  Location: [facility] INVASIVE CV LAB;  Service: Cardiovascular;  Laterality: N/A;  . DIALYSIS/PERMA CATHETER REMOVAL N/A 11/19/2017   Procedure: DIALYSIS/PERMA CATHETER REMOVAL;  Surgeon: Annice Needy, MD;  Location: [facility] INVASIVE CV LAB;  Service: Cardiovascular;  Laterality: N/A;  . UPPER GI ENDOSCOPY      SOCIAL HISTORY:   Social History   Tobacco Use  . Smoking status: Passive Smoke Exposure - Never Smoker  . Smokeless tobacco: Never Used  Substance Use Topics  . Alcohol use: No    FAMILY HISTORY:   Family History  Problem Relation Age of Onset  . Hypertension Mother     DRUG ALLERGIES:   Allergies  Allergen Reactions  . Morphine And Related Other (See Comments)    Hallucinations    REVIEW OF SYSTEMS:  Review of Systems  Constitutional: Negative for chills, fever and weight loss.  HENT: Negative for ear discharge, ear pain and nosebleeds.   Eyes: Negative for blurred vision, pain and discharge.  Respiratory: Negative for sputum production, shortness of breath, wheezing and stridor.   Cardiovascular: Negative for chest pain, palpitations, orthopnea and PND.  Gastrointestinal: Negative for abdominal pain, diarrhea, nausea and vomiting.  Genitourinary: Negative for frequency and urgency.  Musculoskeletal: Negative  for back pain and joint pain.  Neurological: Negative for sensory change, speech change, focal weakness and weakness.  Psychiatric/Behavioral: Negative for depression and hallucinations. The patient is not nervous/anxious.      MEDICATIONS AT HOME:   Prior to Admission medications   Medication Sig Start Date End Date Taking? Authorizing Provider  apixaban (ELIQUIS) 5 MG TABS tablet Take 1 tablet (5 mg total) by mouth 2 (two) times daily. 05/02/17  Yes  Alford Highland, MD  buPROPion [facility] SR) 150 MG 12 hr tablet Take 150 mg by mouth 2 (two) times daily.   Yes [provider]  docusate sodium (COLACE) 100 MG capsule Take 1 capsule (100 mg total) by mouth 2 (two) times daily. 04/13/17  Yes Shaune Pollack, MD  ferrous sulfate 325 (65 FE) MG tablet Take 1 tablet (325 mg total) by mouth daily at 12 noon. Patient taking differently: Take 325 mg by mouth daily.  04/14/17  Yes Shaune Pollack, MD  gabapentin (NEURONTIN) 100 MG capsule Take 100-300 mg by mouth See admin instructions. Take 100 mg by mouth in the evening on Tuesday, Thursday and Saturday. Take 300 mg by mouth in the evening on all other days   Yes [provider]  HYDROcodone-acetaminophen (NORCO) 5-325 MG tablet Take 1-2 tablets by mouth every 6 (six) hours as needed for moderate pain or severe pain. 10/05/17  Yes Schnier, Latina Craver, MD  levothyroxine (SYNTHROID, LEVOTHROID) 75 MCG tablet Take 75 mcg by mouth daily before breakfast.   Yes [provider]  losartan (COZAAR) 100 MG tablet Take 100 mg by mouth at bedtime.    Yes [provider]  metoprolol tartrate (LOPRESSOR) 25 MG tablet Take 25 mg by mouth 2 (two) times daily.   Yes [provider]  multivitamin (RENA-VIT) TABS tablet Take 1 tablet by mouth at bedtime. 04/13/17  Yes Shaune Pollack, MD  multivitamin-lutein [facility]) CAPS capsule Take 1 capsule by mouth daily.   Yes [provider]  ondansetron (ZOFRAN) 4 MG/2ML SOLN injection Inject 4 mg into the muscle every 6 (six) hours as needed for nausea or vomiting.   Yes [provider]  sertraline (ZOLOFT) 25 MG tablet Take 25 mg by mouth daily.    Yes [provider]  sevelamer carbonate (RENVELA) 800 MG tablet Take 800 mg by mouth 3 (three) times daily with meals.    Yes [provider]  torsemide (DEMADEX) 20 MG tablet Take 2 tablets (40 mg total) by mouth daily. Patient taking differently: Take 40 mg by  mouth 4 (four) times a week. Monday, Wednesday, Friday and Sunday 05/02/17  Yes Wieting, Richard, MD      VITAL SIGNS:  Blood pressure 113/69, pulse (!) 58, temperature 97.8 F (36.6 C), temperature source Oral, resp. rate 16, height 5\' 10"  (1.778 m), weight 133 kg (293 lb 3.4 oz), SpO2 96 %.  PHYSICAL EXAMINATION:  GENERAL:  37 y.o.-year-old patient lying in the bed with no acute distress. obese EYES: Pupils equal, round, reactive to light and accommodation. No scleral icterus. Extraocular muscles intact.  HEENT: Head atraumatic, normocephalic. Oropharynx and nasopharynx clear.  NECK:  Supple, no jugular venous distention. No thyroid enlargement, no tenderness.  LUNGS: Normal breath sounds bilaterally, no wheezing, rales,rhonchi or crepitation. No use of accessory muscles of respiration.  CARDIOVASCULAR: S1, S2 normal. No murmurs, rubs, or gallops.  ABDOMEN: Soft, nontender, nondistended. Bowel sounds present. No organomegaly or mass.  EXTREMITIES: No pedal edema, cyanosis, or clubbing. Left lower extremity HD fistula.  Some old crusted blood noted. NEUROLOGIC: Cranial nerves II through XII are intact. Muscle strength 5/5 in all extremities. Sensation intact. Gait not checked.  PSYCHIATRIC: The patient is alert and oriented x 3.  SKIN: No obvious rash, lesion, or ulcer.   LABORATORY PANEL:   CBC Recent Labs  Lab 02/23/18 0826  WBC 6.5  HGB 10.9*  HCT 30.2*  PLT 250   ------------------------------------------------------------------------------------------------------------------  Chemistries  Recent Labs  Lab 02/23/18 0826  NA 141  K 4.6  CL 96*  CO2 33*  GLUCOSE 89  BUN 68*  CREATININE 7.65*  CALCIUM 8.4*   ------------------------------------------------------------------------------------------------------------------  Cardiac Enzymes No results for input(s): TROPONINI in the last 168  hours. ------------------------------------------------------------------------------------------------------------------  RADIOLOGY:  Dg Chest 2 View  Result Date: 02/23/2018 CLINICAL DATA:  Missed dialysis. EXAM: CHEST - 2 VIEW COMPARISON:  06/23/2017 FINDINGS: Normal heart size. Decreased lung volumes. Small bilateral pleural effusions are identified. No interstitial edema. No airspace opacities. IMPRESSION: 1. Small bilateral pleural effusions. Electronically Signed   By: Signa Kellaylor  Stroud M.D.   On: 02/23/2018 09:11    EKG:    IMPRESSION AND PLAN:  [name]  is a 37 y.o. male with a known history of end-stage renal disease due to nephrotic syndrome on hemodialysis from September 2018, diastolic congestive heart failure comes to the emergency room date of malfunctioning left AV fistula at dialysis today. Patient says his left arm is hurting since they tried several times to get blood return . Came to the emergency room now admitted for evaluation of dialysis access.  1. malfunctioning HD Access left upper extremity fistula -admit to medical floor -nephrology consultation with Dr. Johnette AbrahamLatif----he will talk with Dr. Wyn Quakerew from vascular surgery -I am holding up patient's eliquis in case he needs any intervention for his access  2. chronic right common femoral thrombus -patient on anticoagulation with eliquis--- holding it right now  3. End-stage renal disease on hemodialysis in September 2018  4. Hypertension continue home meds  5. DVT prophylaxis  -will do heparin subcu TID since patient's eliquis is on hold    All the records are reviewed and case discussed with ED provider. Management plans discussed with the patient, family and they are in agreement.  CODE STATUS: full  TOTAL TIME TAKING CARE OF THIS PATIENT:  45* minutes.    Enedina FinnerSona Detric Scalisi M.D on 02/23/2018 at 11:41 AM  Between 7am to 6pm - Pager - [phone]  After 6pm go to [email or web address] - password [email or web address] [facility]  [facility]  Hospitalists  Office  [phone]  CC: Primary care physician; Abner GreenspanHodges, Beth, MD

## 2018-02-24 DIAGNOSIS — L97122 Non-pressure chronic ulcer of left thigh with fat layer exposed: Secondary | ICD-10-CM | POA: Diagnosis not present

## 2018-02-24 MED ORDER — LIDOCAINE-PRILOCAINE 2.5-2.5 % EX CREA
TOPICAL_CREAM | CUTANEOUS | Status: DC | PRN
  Administered 2018-02-24: 10:00:00 via TOPICAL
  Filled 2018-02-24: qty 5

## 2018-02-24 MED ORDER — SODIUM THIOSULFATE 25 % IV SOLN
25.0000 g | Freq: Once | INTRAVENOUS | Status: AC
  Administered 2018-02-24: 25 g via INTRAVENOUS
  Filled 2018-02-24: qty 100

## 2018-02-24 NOTE — Progress Notes (Signed)
02/24/2018 3:49 PM  Called report to receiving nurse Juliette AlcideMelinda at [name], where pt will be discharging.  Bradly Chrisougherty, Lariza Cothron E, RN

## 2018-02-24 NOTE — Discharge Instructions (Signed)
Has outpatient fistula grandma appointment on Wednesday, July 17th

## 2018-02-24 NOTE — Progress Notes (Signed)
This note also relates to the following rows which could not be included: Pulse Rate - Cannot attach notes to unvalidated device data Resp - Cannot attach notes to unvalidated device data BP - Cannot attach notes to unvalidated device data  Hd completed  

## 2018-02-24 NOTE — NC FL2 (Signed)
MEDICAID FL2 LEVEL OF CARE SCREENING TOOL     IDENTIFICATION  Patient Name: [name] Birthdate: 1981-01-17 Sex: male Admission Date (Current Location): 02/23/2018  [facility] and [place] Number:  Chiropodist and Address:  [facility], [address] [place], [place] [address]      Provider Number: [id number]  Attending Physician Name and Address:  Enedina Finner, MD  Relative Name and Phone Number:       Current Level of Care: Hospital Recommended Level of Care: Skilled Nursing Facility Prior Approval Number:    Date Approved/Denied:   PASRR Number:    Discharge Plan: SNF    Current Diagnoses: Patient Active Problem List   Diagnosis Date Noted  . Malfunction of arteriovenous dialysis fistula (HCC) 02/23/2018  . PAD (peripheral artery disease) (HCC) 10/22/2017  . ESRD on dialysis (HCC) 08/22/2017  . Essential hypertension 08/22/2017  . Protein-calorie malnutrition, severe 04/30/2017  . 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 Acute on chronic respiratory failure with hypoxia and hypercapnia (HCC)   . Pleural effusion, bilateral   . Postprocedural pneumothorax   . ARF (acute renal failure) (HCC) 03/30/2017    Orientation RESPIRATION BLADDER Height & Weight     Self, Time, Situation, Place  Normal Continent Weight: 293 lb 3.4 oz (133 kg) Height:  5\' 10"  (177.8 cm)  BEHAVIORAL SYMPTOMS/MOOD NEUROLOGICAL BOWEL NUTRITION STATUS      Continent Diet(Renal with fluid restriction (1200 mL))  AMBULATORY STATUS COMMUNICATION OF NEEDS Skin   Extensive Assist Verbally Surgical wounds                       Personal Care Assistance Level of Assistance  Bathing, Feeding, Dressing Bathing Assistance: Maximum assistance Feeding assistance: Independent Dressing Assistance: Maximum assistance     Functional Limitations Info  Sight, Hearing, Speech Sight Info: Adequate Hearing Info: Adequate Speech Info: Adequate     SPECIAL CARE FACTORS FREQUENCY                       Contractures Contractures Info: Not present    Additional Factors Info  Code Status, Allergies, Isolation Precautions Code Status Info: Full Allergies Info: Morphine And Related     Isolation Precautions Info: Contact precautions (MRSA)     Current Medications (02/24/2018):  This is the current hospital active medication list Current Facility-Administered Medications  Medication Dose Route Frequency Provider Last Rate Last Dose  . acetaminophen (TYLENOL) tablet 650 mg  650 mg Oral Q6H PRN Enedina Finner, MD       Or  . acetaminophen (TYLENOL) suppository 650 mg  650 mg Rectal Q6H PRN Enedina Finner, MD      . buPROPion [facility] SR) 12 hr tablet 150 mg  150 mg Oral BID Enedina Finner, MD   150 mg at 02/24/18 0959  . Chlorhexidine Gluconate Cloth 2 % PADS 6 each  6 each Topical Q0600 Mady Haagensen, MD   6 each at 02/24/18 0529  . docusate sodium (COLACE) capsule 100 mg  100 mg Oral BID Enedina Finner, MD   Stopped at 02/24/18 [phone]  . ferrous sulfate tablet 325 mg  325 mg Oral Q1200 Enedina Finner, MD   Stopped at 02/24/18 1056  . gabapentin (NEURONTIN) capsule 100 mg  100 mg Oral Once per day on Tue Thu Sat Enedina Finner, MD   100 mg at 02/23/18 2228   And  . gabapentin (NEURONTIN) capsule  300 mg  300 mg Oral Once per day on Sun Mon Wed Vilma MeckelFri Patel, Sona, MD      . heparin injection 5,000 Units  5,000 Units Subcutaneous Tedra CoupeQ8H Enedina FinnerPatel, Sona, MD   5,000 Units at 02/24/18 0529  . HYDROcodone-acetaminophen (NORCO/VICODIN) 5-325 MG per tablet 1-2 tablet  1-2 tablet Oral Q6H PRN Enedina FinnerPatel, Sona, MD      . levothyroxine (SYNTHROID, LEVOTHROID) tablet 75 mcg  75 mcg Oral QAC breakfast Enedina FinnerPatel, Sona, MD      . lidocaine-prilocaine (EMLA) cream   Topical Q dialysis Lateef, Munsoor, MD      . losartan (COZAAR) tablet 100 mg  100 mg Oral QHS Enedina FinnerPatel, Sona, MD   100 mg at 02/23/18 2228  . metoprolol tartrate (LOPRESSOR) tablet 25 mg  25 mg Oral BID Enedina FinnerPatel,  Sona, MD   Stopped at 02/24/18 [phone]  . multivitamin (RENA-VIT) tablet 1 tablet  1 tablet Oral QHS Enedina FinnerPatel, Sona, MD   1 tablet at 02/23/18 2228  . multivitamin-lutein (OCUVITE-LUTEIN) capsule 1 capsule  1 capsule Oral Daily Enedina FinnerPatel, Sona, MD   1 capsule at 02/24/18 0956  . ondansetron (ZOFRAN) tablet 4 mg  4 mg Oral Q6H PRN Enedina FinnerPatel, Sona, MD       Or  . ondansetron [facility](ZOFRAN) injection 4 mg  4 mg Intravenous Q6H PRN Enedina FinnerPatel, Sona, MD      . ondansetron [facility](ZOFRAN) injection 4 mg  4 mg Intramuscular Q6H PRN Enedina FinnerPatel, Sona, MD      . polyethylene glycol (MIRALAX / GLYCOLAX) packet 17 g  17 g Oral Daily PRN Enedina FinnerPatel, Sona, MD      . sertraline (ZOLOFT) tablet 25 mg  25 mg Oral Daily Enedina FinnerPatel, Sona, MD   25 mg at 02/24/18 0956  . sevelamer carbonate (RENVELA) tablet 800 mg  800 mg Oral TID WC Enedina FinnerPatel, Sona, MD   Stopped at 02/24/18 1056  . 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 torsemide (DEMADEX) tablet 40 mg  40 mg Oral Once per day on Sun Tue Thu Sat Enedina FinnerPatel, Sona, MD   40 mg at 02/24/18 [phone]     Discharge Medications: Please see discharge summary for a list of discharge medications.  Relevant Imaging Results:  Relevant Lab Results:   Additional Information    Judi CongKaren M Leann Mayweather, LCSW

## 2018-02-24 NOTE — Progress Notes (Signed)
Hd started  

## 2018-02-24 NOTE — Discharge Summary (Signed)
[facility] - [place] at [facility]   PATIENT NAME: [name]    MR#:  [id number]  DATE OF BIRTH:  09/16/1980  DATE OF ADMISSION:  02/23/2018 ADMITTING PHYSICIAN: Enedina Finner, MD  DATE OF DISCHARGE: 02/24/2018  PRIMARY CARE PHYSICIAN: Abner Greenspan, MD    ADMISSION DIAGNOSIS:  Complication of arteriovenous dialysis fistula, initial encounter [T82.9XXA]  DISCHARGE DIAGNOSIS:  malfunctioning AV fistula--- now functioning okay. Got dialysis times two without any issues with access  SECONDARY DIAGNOSIS:   Past Medical History:  Diagnosis Date  . Anemia   . CKD (chronic kidney disease)   . 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 Depression   . Diastolic CHF (HCC)   . Generalized anxiety disorder   . Hyperlipidemia   . Hypertension   . Hypertension   . Hypothyroidism   . Lymph edema   . Lymphedema   . Muscle weakness (generalized)   . Nephrotic syndrome   . Obesity   . Pleural effusion   . Pulmonary hypertension (HCC)   . Respiratory failure with hypoxia (HCC)   . Sleep apnea    CPAP  . Vitamin C deficiency     HOSPITAL COURSE:   [name]  is a 37 y.o. male with a known history of end-stage renal disease due to nephrotic syndrome on hemodialysis from September 2018, diastolic congestive heart failure comes to the emergency room date of malfunctioning left AV fistula at dialysis today. Patient says his left arm is hurting since they tried several times to get blood return . Came to the emergency room now admitted for evaluation of dialysis access.  1. malfunctioning HD Access left upper extremity fistula -admit to medical floor -nephrology consultation with Dr. Cherylann Ratel -Got dialyzed yesterday and today without any problems accessing his fistula. -pt is scheduled for left AV fistulogram on Wednesday, July 17 as outpatient  2. chronic right common femoral thrombus -patient on anticoagulation with eliquis--- resumed  3. End-stage renal  disease on hemodialysis in September 2018  4. Hypertension continue home meds  5. DVT prophylaxis  -will do heparin subcu TID since patient's eliquis is on hold  Patient will discharged today after hemodialysis. This was discussed with nephrology. CONSULTS OBTAINED:  Treatment Team:  Mady Haagensen, MD  DRUG ALLERGIES:   Allergies  Allergen Reactions  . Morphine And Related Other (See Comments)    Hallucinations    DISCHARGE MEDICATIONS:   Allergies as of 02/24/2018      Reactions   Morphine And Related Other (See Comments)   Hallucinations      Medication List    TAKE these medications   apixaban 5 MG Tabs tablet Commonly known as:  ELIQUIS Take 1 tablet (5 mg total) by mouth 2 (two) times daily.   buPROPion 150 MG 12 hr tablet Commonly known as:  WELLBUTRIN SR Take 150 mg by mouth 2 (two) times daily.   docusate sodium 100 MG capsule Commonly known as:  COLACE Take 1 capsule (100 mg total) by mouth 2 (two) times daily.   ferrous sulfate 325 (65 FE) MG tablet Take 1 tablet (325 mg total) by mouth daily at 12 noon. What changed:  when to take this   gabapentin 100 MG capsule Commonly known as:  NEURONTIN Take 100-300 mg by mouth See admin instructions. Take 100 mg by mouth in the evening on Tuesday, Thursday and Saturday. Take 300 mg by mouth in the evening on all other days   HYDROcodone-acetaminophen 5-325 MG  tablet Commonly known as:  NORCO Take 1-2 tablets by mouth every 6 (six) hours as needed for moderate pain or severe pain.   levothyroxine 75 MCG tablet Commonly known as:  SYNTHROID, LEVOTHROID Take 75 mcg by mouth daily before breakfast.   losartan 100 MG tablet Commonly known as:  COZAAR Take 100 mg by mouth at bedtime.   metoprolol tartrate 25 MG tablet Commonly known as:  LOPRESSOR Take 25 mg by mouth 2 (two) times daily.   multivitamin Tabs tablet Take 1 tablet by mouth at bedtime.   multivitamin-lutein Caps capsule Take 1 capsule  by mouth daily.   ondansetron 4 MG/2ML Soln injection Commonly known as:  ZOFRAN Inject 4 mg into the muscle every 6 (six) hours as needed for nausea or vomiting.   sertraline 25 MG tablet Commonly known as:  ZOLOFT Take 25 mg by mouth daily.   sevelamer carbonate 800 MG tablet Commonly known as:  RENVELA Take 800 mg by mouth 3 (three) times daily with meals.   torsemide 20 MG tablet Commonly known as:  DEMADEX Take 2 tablets (40 mg total) by mouth daily. What changed:    when to take this  additional instructions       If you experience worsening of your admission symptoms, develop shortness of breath, life threatening emergency, suicidal or homicidal thoughts you must seek medical attention immediately by calling 911 or calling your MD immediately  if symptoms less severe.  You Must read complete instructions/literature along with all the possible adverse reactions/side effects for all the Medicines you take and that have been prescribed to you. Take any new Medicines after you have completely understood and accept all the possible adverse reactions/side effects.   Please note  You were cared for by a hospitalist during your hospital stay. If you have any questions about your discharge medications or the care you received while you were in the hospital after you are discharged, you can call the unit and asked to speak with the hospitalist on call if the hospitalist that took care of you is not available. Once you are discharged, your primary care physician will handle any further medical issues. Please note that NO REFILLS for any discharge medications will be authorized once you are discharged, as it is imperative that you return to your primary care physician (or establish a relationship with a primary care physician if you do not have one) for your aftercare needs so that they can reassess your need for medications and monitor your lab values. Today   SUBJECTIVE   No  complaints  VITAL SIGNS:  Blood pressure (!) 106/58, pulse (!) 52, temperature 97.6 F (36.4 C), temperature source Oral, resp. rate 13, height 5\' 10"  (1.778 m), weight 133 kg (293 lb 3.4 oz), SpO2 98 %.  I/O:    Intake/Output Summary (Last 24 hours) at 02/24/2018 1302 Last data filed at 02/24/2018 0018 Gross per 24 hour  Intake 0 ml  Output 3140 ml  Net -3140 ml    PHYSICAL EXAMINATION:  GENERAL:  37 y.o.-year-old patient lying in the bed with no acute distress.  EYES: Pupils equal, round, reactive to light and accommodation. No scleral icterus. Extraocular muscles intact.  HEENT: Head atraumatic, normocephalic. Oropharynx and nasopharynx clear.  NECK:  Supple, no jugular venous distention. No thyroid enlargement, no tenderness.  LUNGS: Normal breath sounds bilaterally, no wheezing, rales,rhonchi or crepitation. No use of accessory muscles of respiration.  CARDIOVASCULAR: S1, S2 normal. No murmurs, rubs, or gallops.  ABDOMEN: Soft, non-tender, non-distended. Bowel sounds present. No organomegaly or mass.  EXTREMITIES: No pedal edema, cyanosis, or clubbing. Left UE AV fistula NEUROLOGIC: Cranial nerves II through XII are intact. Muscle strength 5/5 in all extremities. Sensation intact. Gait not checked.  PSYCHIATRIC: The patient is alert and oriented x 3.  SKIN: No obvious rash, lesion, or ulcer.   DATA REVIEW:   CBC  Recent Labs  Lab 02/23/18 0826  WBC 6.5  HGB 10.9*  HCT 30.2*  PLT 250    Chemistries  Recent Labs  Lab 02/23/18 0826  NA 141  K 4.6  CL 96*  CO2 33*  GLUCOSE 89  BUN 68*  CREATININE 7.65*  CALCIUM 8.4*    Microbiology Results   Recent Results (from the past 240 hour(s))  MRSA PCR Screening     Status: Abnormal   Collection Time: 02/23/18 11:32 AM  Result Value Ref Range Status   MRSA by PCR POSITIVE (A) NEGATIVE Final    Comment:        The GeneXpert MRSA Assay (FDA approved for NASAL specimens only), is one component of a comprehensive  MRSA colonization surveillance program. It is not intended to diagnose MRSA infection nor to guide or monitor treatment for MRSA infections. RESULT CALLED TO, READ BACK BY AND VERIFIED WITH: JAMIE OWENS AT 1304 ON 02/23/18 BY SNJ Performed at [facility], [address] [place], [place] [address]     RADIOLOGY:  Dg Chest 2 View  Result Date: 02/23/2018 CLINICAL DATA:  Missed dialysis. EXAM: CHEST - 2 VIEW COMPARISON:  06/23/2017 FINDINGS: Normal heart size. Decreased lung volumes. Small bilateral pleural effusions are identified. No interstitial edema. No airspace opacities. IMPRESSION: 1. Small bilateral pleural effusions. Electronically Signed   By: Signa Kellaylor  Stroud M.D.   On: 02/23/2018 09:11     Management plans discussed with the patient, family and they are in agreement.  CODE STATUS:     Code Status Orders  (From admission, onward)        Start     Ordered   02/23/18 1044  Full code  Continuous     02/23/18 1043    Code Status History    Date Active Date Inactive Code Status Order ID Comments User Context   04/21/2017 1205 05/02/2017 1818 Full Code [id number]  Enid BaasKalisetti, Radhika, MD Inpatient   03/30/2017 2052 04/17/2017 0102 Full Code [id number]  Enid BaasKalisetti, Radhika, MD Inpatient    Advance Directive Documentation     Most Recent Value  Type of Advance Directive  Healthcare Power of Attorney  Pre-existing out of facility DNR order (yellow form or pink MOST form)  -  "MOST" Form in Place?  -      TOTAL TIME TAKING CARE OF THIS PATIENT: *40* minutes.    Enedina FinnerSona Ferris Fielden M.D on 02/24/2018 at 1:02 PM  Between 7am to 6pm - Pager - [phone] After 6pm go to [email or web address] - Social research officer, governmentpassword [email or web address]  [facility]  Office  [phone]  CC: Primary care physician; Abner GreenspanHodges, Beth, MD

## 2018-02-24 NOTE — Clinical Social Work Note (Signed)
Clinical Social Work Assessment  Patient Details  Name: [name] MRN: [id number] Date of Birth: [birth year]  Date of referral:  02/24/18               Reason for consult:  Facility Placement                Permission sought to share information with:  Facility Industrial/product designerContact Representative Permission granted to share information::  Yes, Verbal Permission Granted  Name::        Agency::  [facility]  Relationship::     Contact Information:     Housing/Transportation Living arrangements for the past 2 months:  Skilled Nursing Facility Source of Information:  Patient, Medical Team, Facility Patient Interpreter Needed:  None Criminal Activity/Legal Involvement Pertinent to Current Situation/Hospitalization:  No - Comment as needed Significant Relationships:  Merchandiser, retailCommunity Support, Parents, Friend Lives with:  Facility Resident Do you feel safe going back to the place where you live?  Yes Need for family participation in patient care:  No (Coment)  Care giving concerns:  Patient admitted from [facility] with malfunctioning fistula  Social Worker assessment / plan:  The patient will discharge today back to his facility [facility] where he has been a long term care resident for the past year. [facility]. The patient will need an outpatient fistulogram as follow up per Nephrology, and the patient has had a successful dialysis treatment while inpatient. The CSW has delivered all documentation to the chart and the facility. The CSW is signing off. Please consult should needs arise.  Insurance information:  [place] Recommendations:  Not assessed at this time Information / Referral to community resources:     Patient/Family's Response to care:  The patient thanked the CSW.  Patient/Family's Understanding of and Emotional Response to Diagnosis, Current Treatment, and Prognosis:  *The patient is aware of  the discharge plan and is in agreement.  Emotional Assessment Appearance:  Appears stated age Attitude/Demeanor/Rapport:  Engaged Affect (typically observed):  Stable Orientation:  Oriented to Self, Oriented to  Time, Oriented to Place, Oriented to Situation Alcohol / Substance use:  Never Used Psych involvement (Current and /or in the community):  No (Comment)  Discharge Needs  Concerns to be addressed:  Discharge Planning Concerns, Care Coordination Readmission within the last 30 days:  No Current discharge risk:  Chronically ill Barriers to Discharge:  No Barriers Identified   Judi CongKaren M Anallely Rosell, LCSW 02/24/2018, 2:07 PM

## 2018-02-24 NOTE — Progress Notes (Signed)
02/24/2018 5:41 PM  Nils FlackJoseph Mcqueen to be D/C'd back to [facility] per MD order.  Discussed prescriptions and follow up appointments with the patient. Prescriptions given to patient, medication list explained in detail. Pt verbalized understanding.  Allergies as of 02/24/2018      Reactions   Morphine And Related Other (See Comments)   Hallucinations      Medication List    TAKE these medications   apixaban 5 MG Tabs tablet Commonly known as:  ELIQUIS Take 1 tablet (5 mg total) by mouth 2 (two) times daily.   buPROPion 150 MG 12 hr tablet Commonly known as:  WELLBUTRIN SR Take 150 mg by mouth 2 (two) times daily.   docusate sodium 100 MG capsule Commonly known as:  COLACE Take 1 capsule (100 mg total) by mouth 2 (two) times daily.   ferrous sulfate 325 (65 FE) MG tablet Take 1 tablet (325 mg total) by mouth daily at 12 noon. What changed:  when to take this   gabapentin 100 MG capsule Commonly known as:  NEURONTIN Take 100-300 mg by mouth See admin instructions. Take 100 mg by mouth in the evening on Tuesday, Thursday and Saturday. Take 300 mg by mouth in the evening on all other days   HYDROcodone-acetaminophen 5-325 MG tablet Commonly known as:  NORCO Take 1-2 tablets by mouth every 6 (six) hours as needed for moderate pain or severe pain.   levothyroxine 75 MCG tablet Commonly known as:  SYNTHROID, LEVOTHROID Take 75 mcg by mouth daily before breakfast.   losartan 100 MG tablet Commonly known as:  COZAAR Take 100 mg by mouth at bedtime.   metoprolol tartrate 25 MG tablet Commonly known as:  LOPRESSOR Take 25 mg by mouth 2 (two) times daily.   multivitamin Tabs tablet Take 1 tablet by mouth at bedtime.   multivitamin-lutein Caps capsule Take 1 capsule by mouth daily.   ondansetron 4 MG/2ML Soln injection Commonly known as:  ZOFRAN Inject 4 mg into the muscle every 6 (six) hours as needed for nausea or vomiting.   sertraline 25 MG tablet Commonly known  as:  ZOLOFT Take 25 mg by mouth daily.   sevelamer carbonate 800 MG tablet Commonly known as:  RENVELA Take 800 mg by mouth 3 (three) times daily with meals.   torsemide 20 MG tablet Commonly known as:  DEMADEX Take 2 tablets (40 mg total) by mouth daily. What changed:    when to take this  additional instructions       Vitals:   02/24/18 1439 02/24/18 1557  BP:  121/71  Pulse: 65 66  Resp:  16  Temp:  98.1 F (36.7 C)  SpO2:  99%    Skin clean, dry and intact without evidence of skin break down, no evidence of skin tears noted. IV catheter discontinued intact. Site without signs and symptoms of complications. Dressing and pressure applied. Pt denies pain at this time. No complaints noted.  An After Visit Summary was printed and given to the patient. Patient escorted and D/C  via EMS.  Bradly Chrisougherty, Charish Schroepfer E

## 2018-02-24 NOTE — Progress Notes (Signed)
[facility]  ROUNDING NOTE   Subjective:  Patient seen and evaluated during hemodialysis. Tolerating well.  No issues with cannulation of his access today. Blood flow rate 400 with the ultrafiltration target of 3 kg today.  Objective:  Vital signs in last 24 hours:  Temp:  [97.6 F (36.4 C)-98.1 F (36.7 C)] 97.6 F (36.4 C) (07/14 1115) Pulse Rate:  [50-63] 57 (07/14 1145) Resp:  [8-20] 20 (07/14 1145) BP: (97-127)/(51-81) 114/64 (07/14 1145) SpO2:  [95 %-100 %] 98 % (07/14 0521)  Weight change:  Filed Weights   02/23/18 0816  Weight: 133 kg (293 lb 3.4 oz)    Intake/Output: I/O last 3 completed shifts: In: 0  Out: 3140 [Urine:140; Other:3000]   Intake/Output this shift:  No intake/output data recorded.  Physical Exam: General: No acute distress  Head: Normocephalic, atraumatic. Moist oral mucosal membranes  Eyes: Anicteric  Neck: Supple, trachea midline  Lungs:  Clear to auscultation, normal effort  Heart: S1S2 no rubs  Abdomen:  Soft, nontender, bowel sounds present  Extremities: Trace peripheral edema.  Neurologic: Awake, alert, following commands  Skin: No lesions  Access: LUE AVF with bruit and thrill.    Basic Metabolic Panel: Recent Labs  Lab 02/23/18 0826 02/23/18 1306  NA 141  --   K 4.6  --   CL 96*  --   CO2 33*  --   GLUCOSE 89  --   BUN 68*  --   CREATININE 7.65*  --   CALCIUM 8.4*  --   PHOS  --  4.4    Liver Function Tests: No results for input(s): AST, ALT, ALKPHOS, BILITOT, PROT, ALBUMIN in the last 168 hours. No results for input(s): LIPASE, AMYLASE in the last 168 hours. No results for input(s): AMMONIA in the last 168 hours.  CBC: Recent Labs  Lab 02/23/18 0826  WBC 6.5  NEUTROABS 3.0  HGB 10.9*  HCT 30.2*  MCV 86.7  PLT 250    Cardiac Enzymes: No results for input(s): CKTOTAL, CKMB, CKMBINDEX, TROPONINI in the last 168 hours.  BNP: Invalid input(s): POCBNP  CBG: No results for input(s): GLUCAP  in the last 168 hours.  Microbiology: Results for orders placed or performed during the hospital encounter of 02/23/18  MRSA PCR Screening     Status: Abnormal   Collection Time: 02/23/18 11:32 AM  Result Value Ref Range Status   MRSA by PCR POSITIVE (A) NEGATIVE Final    Comment:        The GeneXpert MRSA Assay (FDA approved for NASAL specimens only), is one component of a comprehensive MRSA colonization surveillance program. It is not intended to diagnose MRSA infection nor to guide or monitor treatment for MRSA infections. RESULT CALLED TO, READ BACK BY AND VERIFIED WITH: JAMIE OWENS AT 1304 ON 02/23/18 BY SNJ Performed at [facility], [address] [place], [place] [address]     Coagulation Studies: No results for input(s): LABPROT, INR in the last 72 hours.  Urinalysis: No results for input(s): COLORURINE, LABSPEC, PHURINE, GLUCOSEU, HGBUR, BILIRUBINUR, KETONESUR, PROTEINUR, UROBILINOGEN, NITRITE, LEUKOCYTESUR in the last 72 hours.  Invalid input(s): APPERANCEUR    Imaging: Dg Chest 2 View  Result Date: 02/23/2018 CLINICAL DATA:  Missed dialysis. EXAM: CHEST - 2 VIEW COMPARISON:  06/23/2017 FINDINGS: Normal heart size. Decreased lung volumes. Small bilateral pleural effusions are identified. No interstitial edema. No airspace opacities. IMPRESSION: 1. Small bilateral pleural effusions. Electronically Signed   By: Veronda Prudeaylor  Stroud M.D.  On: 02/23/2018 09:11     Medications:   . sodium thiosulfate infusion for calciphylaxis     . buPROPion  150 mg Oral BID  . Chlorhexidine Gluconate Cloth  6 each Topical Q0600  . docusate sodium  100 mg Oral BID  . ferrous sulfate  325 mg Oral Q1200  . gabapentin  100 mg Oral Once per day on Tue Thu Sat   And  . gabapentin  300 mg Oral Once per day on Sun Mon Wed Fri  . heparin  5,000 Units Subcutaneous Q8H  . levothyroxine  75 mcg Oral QAC breakfast  . losartan  100 mg Oral QHS  . metoprolol tartrate  25 mg Oral  BID  . multivitamin  1 tablet Oral QHS  . multivitamin-lutein  1 capsule Oral Daily  . sertraline  25 mg Oral Daily  . sevelamer carbonate  800 mg Oral TID WC  . torsemide  40 mg Oral Once per day on Sun Tue Thu Sat   acetaminophen **OR** acetaminophen, HYDROcodone-acetaminophen, lidocaine-prilocaine, ondansetron **OR** ondansetron (ZOFRAN) IV, ondansetron, polyethylene glycol  Assessment/ Plan:  37 y.o. male with hypertension, morbid obesity, pulmonary hypertension, hypothyroidism, hyperlipidemia, depression/anxiety, coronary artery disease, congestive heart failure  [facility] 110kg.  1.  ESRD on HD TTS.  Patient had clots pulled from his access on Saturday.  4 sticks were attempted.  We had no issues with cannulating access upon arrival here.  Patient seen and evaluated during another dialysis session today.  We plan to complete dialysis today with ultrafiltration target of 3 kg.  Thereafter his next outpatient dialysis will be on Tuesday.  2.  Complication dialysis device.  As an outpatient his dialysis access was stuck 4 times without success.  Clots were pulled from his access during these attempts.  -Patient's access cannulated successfully x2 during this admission.  We recommend fistulogram as an outpatient on Wednesday.  This was discussed with vascular surgery.  3.  Anemia of chronic kidney disease.  Continue Mircera as an outpatient.  4.  Secondary hyperparathyroidism.  Phosphorus under good control at 4.4.  Maintain the patient on current dosage of Renvela.    LOS: 1 Sekai Nayak 7/14/201912:17 PM

## 2018-02-25 ENCOUNTER — Other Ambulatory Visit (INDEPENDENT_AMBULATORY_CARE_PROVIDER_SITE_OTHER): Payer: Self-pay | Admitting: Vascular Surgery

## 2018-02-25 ENCOUNTER — Encounter: Payer: Medicaid Other | Admitting: Physician Assistant

## 2018-02-25 DIAGNOSIS — L97122 Non-pressure chronic ulcer of left thigh with fat layer exposed: Secondary | ICD-10-CM | POA: Diagnosis not present

## 2018-02-25 DIAGNOSIS — I110 Hypertensive heart disease with heart failure: Secondary | ICD-10-CM | POA: Diagnosis not present

## 2018-02-25 DIAGNOSIS — I5033 Acute on chronic diastolic (congestive) heart failure: Secondary | ICD-10-CM | POA: Diagnosis not present

## 2018-02-25 DIAGNOSIS — L97112 Non-pressure chronic ulcer of right thigh with fat layer exposed: Secondary | ICD-10-CM | POA: Diagnosis not present

## 2018-02-26 NOTE — Progress Notes (Signed)
[name] ([id number]) Visit Report for 02/25/2018 Chief Complaint Document Details Patient Name: [name] Date of Service: 02/25/2018 8:30 AM Medical Record Number: [id number] Patient Account Number: [id number] Date of Birth/Sex: September 21, 1980 (37 y.o. M) Treating RN: [name] Primary Care Provider: Marco [name] Other Clinician: Referring Provider: Marco [name] Treating Provider/Extender: [name] Weeks in Treatment: 12 Information Obtained from: Patient Chief Complaint Bilateral thigh Calciphylaxis Ulcers Electronic Signature(s) Signed: 02/26/2018 1:18:11 PM By: Brian Keeler PA-C Entered By: [name] on 02/25/2018 08:37:35 [place], [place] ([id number]) -------------------------------------------------------------------------------- HPI Details Patient Name: [name] Date of Service: 02/25/2018 8:30 AM Medical Record Number: [id number] Patient Account Number: [id number] Date of Birth/Sex: 08/21/1980 (37 y.o. 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 He has currently been undergoing treatment according to notes reviewed from 11/28/17 for this. 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 Subsequently patient was using Santyl for very long time but is no longer using that at this point. He is receiving sodium thiosulfate during hemodialysis along with Sevelamer to help control his phosphorus levels. Currently a steroid cream was recommended for the wounds although he has not used that yet. He a has what appears to be extremely hyper granular and poorly  attached granulation noted over the bed of the wound. This is true of both wounds of lower extremities. He does show some signs of epithelialization around the border although this has been very slow according to the patient. Fortunately he is not having significant pain at this point. He does have a history of hypertension as well is congestive heart failure. Otherwise he states the wounds are much better than when I first started. 12/10/17 on evaluation today patient actually presents with signs of improvement in regard to his bilateral thigh ulcer. He has been tolerating the dressing changes without complication which is good news. With that being said is not really having a significant amount of bleeding at this point today is excellent. He has been pleased with the progress as well. There's no evidence of infection. 12/19/17; patient normally followed by Jeri Cos on Mondays. In his absence he comes in to see me today. 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 Initially painful necrotic wounds that have been debrided to a healthy surface. He is using Hydrofera Blue. He has one on the right anterior thigh and one on the left 12/24/17 on evaluation today patient's wounds actually appear to be doing excellent in regard to the bilateral upper thighs. He has been tolerating the dressing changes without complication and overall he seems to be making great progress especially on the left. He has no pain. 12/31/17 on evaluation today patient appears to be doing excellent in regard to his bilateral lower extremity ulcers. The left in fact appears to be completely healed which is great news the right is smaller and doing very well. Overall I'm pleased with the progress he's made the patient is very happy. 01/14/18 on evaluation today patient's ulcer on the right thigh actually appears to be doing much better. It's getting smaller. Unfortunately the left thigh ulcer reopen  due to the dressing having gotten stuck to the wound bed. Subsequently  this pulled off the top layer of skin and he does have an open wound at the site yet again. With that being said there does not appear to be evidence of infection overall things have been going well. 02/04/18 on evaluation today patient's ulcers on his thighs actually appear to be doing fairly well at this point. There does not appear to be any evidence of infection and actually appear to be close today. Unfortunately he has a new wound on the left lateral abdominal region he also has an area of what appears to be superficial bruising noted on the right lateral abdominal region. With that being said I cannot tell for sure that this is actually a Calciphylaxis issue or not at this point it could be but it also may just be the normal bruising/irritation due to his close rubbing. He states this is always been an area that does rub significantly. That's part of the reason why he's looking at the surgery that he's actually scheduled for reevaluation four on 02/12/18. He thought this was sooner when I met with him last but this is actually when the appointment is. 02/18/18 on evaluation today unfortunately both of the patient's upper thigh ulcers have reopened. He does seem to have some hyper granular tissue underneath and I was hoping that this would actually resolve and do well although it appears that the epithelium just has not been able to take due to the fact that there is so much hyper granular tissue underlying. For that reason again this has reopened that does need to be addressed today. Nonetheless the patient is not having any significant pain at the site which is good news. He is having more pain on the left lateral flank where he has a new area of opening due to what appears to be also Calciphylaxis. [name] ([id number]) 02/25/18 on evaluation today patient appears to be doing rather well in regard to his anterior thigh  ulcer's in my pinion. He has been tolerating the dressing changes without complication there does not appear to be any evidence of infection at this time which is good news. The hyper granular tissue is greatly improved although I still think I would recommend the silver nitrate this week as well. Unfortunately the lateral abdominal ulcer actually is giving him more trouble I think this is a newly and evolving area of Calciphylaxis although it seems to be much more superficial and my hope is it will not continue to progress any more deeply. Fortunately he overall seems to be tolerating the Xeroform well I still think that may be the best choice for him. Electronic Signature(s) Signed: 02/26/2018 1:18:11 PM By: Brian Keeler PA-C Entered By: [name] on 02/25/2018 08:53:05 [place], [place] ([id number]) -------------------------------------------------------------------------------- Otelia Sergeant TISS Details Patient Name: [name] Date of Service: 02/25/2018 8:30 AM Medical Record Number: [id number] Patient Account Number: [id number] Date of Birth/Sex: 1980/08/29 (37 y.o. M) Treating RN: [name] Primary Care Provider: Marco [name] Other Clinician: Referring Provider: Marco [name] Treating Provider/Extender: [name] Weeks in Treatment: 12 Procedure Performed for: Wound #5 Left,Medial Upper Leg Performed By: Physician Emilio Math., PA-C Post Procedure Diagnosis Same as Pre-procedure Electronic Signature(s) Signed: 02/25/2018 5:44:59 PM By: [name] Entered By: [name] on 02/25/2018 08:45:59 [place], [place] ([id number]) -------------------------------------------------------------------------------- Otelia Sergeant TISS Details Patient Name: [name] Date of Service: 02/25/2018 8:30 AM Medical Record Number: [id number] Patient Account Number: [id number] Date of Birth/Sex: [birth year] (37 y.o. M) Treating RN:  Flinchum,  Malachy Mood Primary Care Provider: Marco [name] Other Clinician: Referring Provider: Marco [name] Treating Provider/Extender: [name] Weeks in Treatment: 12 Procedure Performed for: Wound #6 Right,Anterior Upper Leg Performed By: Physician Emilio Math., PA-C Post Procedure Diagnosis Same as Pre-procedure Electronic Signature(s) Signed: 02/25/2018 5:44:59 PM By: [name] Entered By: [name] on 02/25/2018 08:46:14 [place], Broadus John ([id number]) -------------------------------------------------------------------------------- Physical Exam Details Patient Name: [name] Date of Service: 02/25/2018 8:30 AM Medical Record Number: [id number] Patient Account Number: [id number] Date of Birth/Sex: [birth year] (37 y.o. M) Treating RN: [name] Primary Care Provider: Marco [name] Other Clinician: Referring Provider: Marco [name] Treating Provider/Extender: STONE III, [name] Weeks in Treatment: 12 Constitutional Obese and well-hydrated in no acute distress. Respiratory normal breathing without difficulty. clear to auscultation bilaterally. Cardiovascular regular rate and rhythm with normal S1, S2. Psychiatric this patient is able to make decisions and demonstrates good insight into disease process. Alert and Oriented x 3. pleasant and cooperative. Notes On inspection today patient's wounds again did have some hyper granular tissue in regard to the anterior thigh ulcer's both which were chemically cauterize with silver nitrate. Fortunately he tolerated this well and no significant discomfort and in general his epithelialization seems to be much better. I'm very pleased in this regard. Electronic Signature(s) Signed: 02/26/2018 1:18:11 PM By: Brian Keeler PA-C Entered By: [name] on 02/25/2018 08:53:44 [place], [place] ([id number]) -------------------------------------------------------------------------------- Physician Orders Details Patient Name: [name] Date of Service: 02/25/2018 8:30 AM Medical Record Number: [id number] Patient Account Number: [id number] Date of Birth/Sex: 12-Jan-1981 (37 y.o. 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 o Clean wound with Normal Saline. Anesthetic (add to Medication List) Wound #4 Left,Lateral Abdomen - Lower Quadrant o Topical Lidocaine 4% cream applied to wound bed prior to debridement (In Clinic Only). Skin Barriers/Peri-Wound Care Wound #4 Left,Lateral Abdomen - Lower Quadrant o Skin Prep Primary Wound Dressing Wound #4 Left,Lateral Abdomen - Lower Quadrant o Xeroform Wound #5 Left,Medial Upper Leg o Hydrafera Blue Ready Transfer Wound #6 Right,Anterior Upper Leg o Hydrafera Blue Ready Transfer Secondary Dressing Wound #4 Left,Lateral Abdomen - Lower Quadrant o Boardered Foam Dressing Wound #5 Left,Medial Upper Leg o Boardered Foam Dressing Wound #6 Right,Anterior Upper Leg o Boardered Foam Dressing [place], [place] ([id number]) Dressing Change Frequency Wound #4 Left,Lateral Abdomen - Lower Quadrant o Change dressing every other day. Wound #5 Left,Medial Upper Leg o Change dressing every other day. Wound #6 Right,Anterior Upper Leg o Change dressing every other day. Follow-up Appointments Wound #4 Left,Lateral Abdomen - Lower Quadrant o Return Appointment in  1 week. Wound #5 Left,Medial Upper Leg o Return Appointment in 1 week. Wound #6 Right,Anterior Upper Leg o Return Appointment in 1 week. Electronic Signature(s) Signed: 02/25/2018 5:44:59 PM By: [name] Signed: 02/26/2018 1:18:11 PM By: Brian Keeler PA-C Entered By: [name] on 02/25/2018 08:48:30 [place], [place] ([id number]) -------------------------------------------------------------------------------- Problem List Details Patient Name: [name] Date of Service: 02/25/2018 8:30 AM Medical Record Number: [id number] Patient Account Number: [id number] Date  of Birth/Sex: 1981/05/27 (37 y.o. 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 12/03/2017 No Yes L97.112 Non-pressure chronic ulcer of right thigh with fat layer 12/03/2017 No Yes exposed [place] (primary) hypertension 12/03/2017 No Yes I50.33 Acute on chronic diastolic (congestive) heart failure 12/03/2017 No Yes Inactive Problems Resolved Problems Electronic Signature(s) Signed: 02/26/2018 1:18:11 PM By: Brian Keeler PA-C Entered By: [name] on 02/25/2018 08:37:28 [place], [place] ([id number]) -------------------------------------------------------------------------------- Progress Note Details Patient Name: [name] Date of Service: 02/25/2018 8:30 AM Medical Record Number: [id number] Patient Account Number: [id number] Date of Birth/Sex: 09-28-1980 (37 y.o. M) Treating RN: [name] Primary Care Provider: Marco [name] Other Clinician: Referring Provider: Marco [name] Treating Provider/Extender: [name] Weeks in  Treatment: 12 Subjective Chief Complaint Information obtained from Patient Bilateral thigh Calciphylaxis Ulcers History of Present Illness (HPI) 12/03/17 on evaluation today patient presents for initial evaluation and the wound center concerning issues he has been having with his bilateral lower extremities with Calciphylaxis. He has currently been undergoing treatment according to notes reviewed from 11/28/17 for this. 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 Subsequently patient was using Santyl for very long time but is no longer using that at this point. He is receiving sodium thiosulfate during hemodialysis along with Sevelamer to help control his phosphorus levels. Currently a steroid cream was recommended for the wounds although he has not used that yet. He a has what appears to be extremely hyper granular and poorly attached granulation noted over the bed of the wound. This is true of both wounds of lower extremities. He does show some signs of epithelialization around the border although this has been very slow according to the patient. Fortunately he is not having significant pain at this point. He does have a history of hypertension as well is congestive heart failure. Otherwise he states the wounds are much better than when I first started. 12/10/17 on evaluation today patient actually presents with signs of improvement in regard to his bilateral thigh ulcer. He has been tolerating the dressing changes without complication which is good news. With that being said is not really having a significant amount of bleeding at this point today is excellent. He has been pleased with the progress as well. There's no evidence of infection. 12/19/17; patient normally followed by Jeri Cos on Mondays. In his absence he comes in to see me today. 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 Initially painful necrotic  wounds that have been debrided to a healthy surface. He is using Hydrofera Blue. He has one on the right anterior thigh and one on the left 12/24/17 on evaluation today patient's wounds actually appear to be doing excellent in regard to the bilateral upper thighs. He has been tolerating the dressing changes without complication and overall he seems to be making great progress especially on the left. He has no pain. 12/31/17 on evaluation today patient appears to be doing excellent in regard to his bilateral lower extremity ulcers. The left in fact appears to be completely healed which is great news the right is smaller and doing very well. Overall I'm pleased with the progress he's made the patient  is very happy. 01/14/18 on evaluation today patient's ulcer on the right thigh actually appears to be doing much better. It's getting smaller. Unfortunately the left thigh ulcer reopen due to the dressing having gotten stuck to the wound bed. Subsequently this pulled off the top layer of skin and he does have an open wound at the site yet again. With that being said there does not appear to be evidence of infection overall things have been going well. 02/04/18 on evaluation today patient's ulcers on his thighs actually appear to be doing fairly well at this point. There does not appear to be any evidence of infection and actually appear to be close today. Unfortunately he has a new wound on the left lateral abdominal region he also has an area of what appears to be superficial bruising noted on the right lateral abdominal region. With that being said I cannot tell for sure that this is actually a Calciphylaxis issue or not at this point it could be but it also may just be the normal bruising/irritation due to his close rubbing. He states this is always been an area that does rub significantly. That's part of the reason why he's looking at the surgery that he's actually scheduled for reevaluation four  on 02/12/18. He thought this was sooner when I met with him last but this is actually when the appointment is. 02/18/18 on evaluation today unfortunately both of the patient's upper thigh ulcers have reopened. He does seem to have some [place], [place] ([id number]) hyper granular tissue underneath and I was hoping that this would actually resolve and do well although it appears that the epithelium just has not been able to take due to the fact that there is so much hyper granular tissue underlying. For that reason again this has reopened that does need to be addressed today. Nonetheless the patient is not having any significant pain at the site which is good news. He is having more pain on the left lateral flank where he has a new area of opening due to what appears to be also Calciphylaxis. 02/25/18 on evaluation today patient appears to be doing rather well in regard to his anterior thigh ulcer's in my pinion. He has been tolerating the dressing changes without complication there does not appear to be any evidence of infection at this time which is good news. The hyper granular tissue is greatly improved although I still think I would recommend the silver nitrate this week as well. Unfortunately the lateral abdominal ulcer actually is giving him more trouble I think this is a newly and evolving area of Calciphylaxis although it seems to be much more superficial and my hope is it will not continue to progress any more deeply. Fortunately he overall seems to be tolerating the Xeroform well I still think that may be the best choice for him. Patient History Information obtained from Patient. Family History Diabetes - Maternal Grandparents, Heart Disease - Maternal Grandparents, Hypertension - Maternal Grandparents, Stroke - Maternal Grandparents, No family history of Cancer, Hereditary Spherocytosis, Kidney Disease, Lung Disease, Seizures, Thyroid Problems, Tuberculosis. Social History Never smoker,  Marital Status - Single, Alcohol Use - Never, Drug Use - No History, Caffeine Use - Daily. Review of Systems (ROS) Constitutional Symptoms (General Health) Denies complaints or symptoms of Fever, Chills. Respiratory The patient has no complaints or symptoms. Cardiovascular The patient has no complaints or symptoms. Psychiatric The patient has no complaints or symptoms. Objective Constitutional Obese and well-hydrated in no acute distress.  Vitals Time Taken: 8:26 AM, Height: 70 in, Weight: 245 lbs, BMI: 35.2, Temperature: 98.1 F, Pulse: 66 bpm, Respiratory Rate: 16 breaths/min, Blood Pressure: 114/51 mmHg. Respiratory normal breathing without difficulty. clear to auscultation bilaterally. Cardiovascular regular rate and rhythm with normal S1, S2. Wentzell, Taelyn ([id number]) Psychiatric this patient is able to make decisions and demonstrates good insight into disease process. Alert and Oriented x 3. pleasant and cooperative. General Notes: On inspection today patient's wounds again did have some hyper granular tissue in regard to the anterior thigh ulcer's both which were chemically cauterize with silver nitrate. Fortunately he tolerated this well and no significant discomfort and in general his epithelialization seems to be much better. I'm very pleased in this regard. Integumentary (Hair, Skin) Wound #4 status is Open. Original cause of wound was Gradually Appeared. The wound is located on the Left,Lateral Abdomen - Lower Quadrant. The wound measures 2.5cm length x 3cm width x 0.1cm depth; 5.89cm^2 area and 0.589cm^3 volume. There is no tunneling or undermining noted. There is a large amount of serosanguineous drainage noted. The wound margin is distinct with the outline attached to the wound base. There is small (1-33%) pink granulation within the wound bed. There is a large (67-100%) amount of necrotic tissue within the wound bed including Eschar and Adherent Slough.  Periwound temperature was noted as No Abnormality. The periwound has tenderness on palpation. Wound #5 status is Open. Original cause of wound was Gradually Appeared. The wound is located on the Left,Medial Upper Leg. The wound measures 0.4cm length x 1.5cm width x 0.1cm depth; 0.471cm^2 area and 0.047cm^3 volume. There is no tunneling or undermining noted. There is a large amount of serosanguineous drainage noted. The wound margin is distinct with the outline attached to the wound base. There is large (67-100%) red, hyper - granulation within the wound bed. There is a small (1-33%) amount of necrotic tissue within the wound bed including Adherent Slough. Periwound temperature was noted as No Abnormality. The periwound has tenderness on palpation. Wound #6 status is Open. Original cause of wound was Gradually Appeared. The wound is located on the Right,Anterior Upper Leg. The wound measures 0.6cm length x 3.5cm width x 0.1cm depth; 1.649cm^2 area and 0.165cm^3 volume. There is no tunneling or undermining noted. There is a large amount of serosanguineous drainage noted. The wound margin is distinct with the outline attached to the wound base. There is large (67-100%) red, hyper - granulation within the wound bed. There is a small (1-33%) amount of necrotic tissue within the wound bed including Adherent Slough. Periwound temperature was noted as No Abnormality. The periwound has tenderness on palpation. 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 An CHEM CAUT GRANULATION TISS procedure was performed by STONE III, [name] E., PA-C. Post  procedure Diagnosis Wound #5: Same as Pre-Procedure Pantano, [place] ([id number]) Wound #6 Pre-procedure diagnosis of Wound #6 is a Calciphylaxis located on the Right,Anterior Upper Leg . An CHEM CAUT GRANULATION TISS procedure was performed by STONE III, [name] E., PA-C. Post procedure Diagnosis Wound #6: Same as Pre-Procedure Verbal informed consent: was obtained for chemical cauterization of hypergranulation tissue with silver nitrate. Risk and benefits were discussed with patient. Patient verbalized understanding that chemical  cauterization is beneficial for managing hypergranulation tissue, and understands how hypergranulation tissue can prevent proper wound healing. The risk of the procedure includes but is not limited to damage of surrounding normally healing tissue. Procedure: Procedure time: 8:50 AM Topical anesthetic in the form of benzocaine spray was applied prior to the start of the procedure Utilizing two silver nitrate sticks, the area of hypergranulation of the bilateral upper thighs was chemically cauterized. I informed the patient that the dark discoloration is a normal result of the silver nitrate product. Patient tolerated the procedure well with no discomfort. Plan Wound Cleansing: Wound #4 Left,Lateral Abdomen - Lower Quadrant: Clean wound with Normal Saline. Clean wound with Normal Saline. Anesthetic (add to Medication List): Wound #4 Left,Lateral Abdomen - Lower Quadrant: Topical Lidocaine 4% cream applied to wound bed prior to debridement (In Clinic Only). Skin Barriers/Peri-Wound Care: Wound #4 Left,Lateral Abdomen - Lower Quadrant: Skin Prep Primary Wound Dressing: Wound #4 Left,Lateral Abdomen - Lower Quadrant: Xeroform Wound #5 Left,Medial Upper Leg: Hydrafera Blue Ready Transfer Wound #6 Right,Anterior Upper Leg: Hydrafera Blue Ready Transfer Secondary Dressing: Wound #4 Left,Lateral Abdomen - Lower Quadrant: Boardered Foam Dressing Wound #5 Left,Medial  Upper Leg: Boardered Foam Dressing Ley, Ladarrious ([id number]) Wound #6 Right,Anterior Upper Leg: Boardered Foam Dressing Dressing Change Frequency: Wound #4 Left,Lateral Abdomen - Lower Quadrant: Change dressing every other day. Wound #5 Left,Medial Upper Leg: Change dressing every other day. Wound #6 Right,Anterior Upper Leg: Change dressing every other day. Follow-up Appointments: Wound #4 Left,Lateral Abdomen - Lower Quadrant: Return Appointment in 1 week. Wound #5 Left,Medial Upper Leg: Return Appointment in 1 week. Wound #6 Right,Anterior Upper Leg: Return Appointment in 1 week. I'm gonna recommend at this point that we continue with the Current wound care measures for the next week. I like to see him back in one weeks time. If anything changes he will let me know. Please see above for specific wound care orders. We will see patient for re-evaluation in 1 week(s) here in the clinic. If anything worsens or changes patient will contact our office for additional recommendations. Electronic Signature(s) Signed: 02/26/2018 1:18:11 PM By: Brian Keeler PA-C Entered By: [name] on 02/25/2018 08:55:23 [place], [place] ([id number]) -------------------------------------------------------------------------------- ROS/PFSH Details Patient Name: [name] Date of Service: 02/25/2018 8:30 AM Medical Record Number: [id number] Patient Account Number: [id number] Date of Birth/Sex: 02-21-1981 (37 y.o. M) Treating RN: [name] Primary Care Provider: Marco [name] Other Clinician: Referring Provider: Marco [name] Treating Provider/Extender: STONE III, [name] Weeks in Treatment: 12 Information Obtained From Patient Wound History Do you currently have one or more open woundso Yes How many open wounds do you currently haveo 2 Approximately how long have you had your woundso 6 months How have you been treating your wound(s) until nowo triamcinalone Has your wound(s) ever healed and  then re-openedo No Have you had any lab work done in the past montho No Have you tested positive for an antibiotic resistant organism (MRSA, VRE)o No Have you tested positive for osteomyelitis (bone infection)o No Have you had any tests for circulation on your legso No Constitutional Symptoms (General Health) Complaints and Symptoms: Negative for: Fever; Chills Hematologic/Lymphatic Medical History: Positive for: Anemia Negative for: Hemophilia; Human Immunodeficiency Virus; Lymphedema; Sickle Cell Disease Respiratory Complaints and Symptoms: No Complaints or Symptoms Medical History: Positive for: Asthma - history Negative for: Aspiration; Chronic Obstructive Pulmonary Disease (COPD); Pneumothorax; Sleep Apnea; Tuberculosis Cardiovascular Complaints and Symptoms: No Complaints or Symptoms Medical History: Positive for: Hypertension Gastrointestinal Medical History:  Negative for: Cirrhosis ; Colitis; Crohnos; Hepatitis A; Hepatitis B; Hepatitis C Genitourinary Henderson, Darl ([id number]) Medical History: Positive for: End Stage Renal Disease Immunological Medical History: Negative for: Lupus Erythematosus; Raynaudos; Scleroderma Musculoskeletal Medical History: Negative for: Gout; Rheumatoid Arthritis; Osteoarthritis; Osteomyelitis Neurologic Medical History: Negative for: Dementia; Neuropathy Oncologic Medical History: Negative for: Received Chemotherapy; Received Radiation Psychiatric Complaints and Symptoms: No Complaints or Symptoms Immunizations Pneumococcal Vaccine: Received Pneumococcal Vaccination: Yes Implantable Devices Family and Social History Cancer: No; Diabetes: Yes - Maternal Grandparents; Heart Disease: Yes - Maternal Grandparents; Hereditary Spherocytosis: No; Hypertension: Yes - Maternal Grandparents; Kidney Disease: No; Lung Disease: No; Seizures: No; Stroke: Yes - Maternal Grandparents; Thyroid Problems: No; Tuberculosis: No; Never smoker; Marital  Status - Single; Alcohol Use: Never; Drug Use: No History; Caffeine Use: Daily; Advanced Directives: No; Patient does not want information on Advanced Directives Physician Affirmation I have reviewed and agree with the above information. Electronic Signature(s) Signed: 02/25/2018 5:44:59 PM By: [name] Signed: 02/26/2018 1:18:11 PM By: Brian Keeler PA-C Entered By: [name] on 02/25/2018 08:53:24 [place], [place] ([id number]) -------------------------------------------------------------------------------- SuperBill Details Patient Name: [name] Date of Service: 02/25/2018 Medical Record Number: [id number] Patient Account Number: [id number] Date of Birth/Sex: 05-Nov-1980 (37 y.o. 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 I10 Essential (primary) hypertension I50.33 Acute on chronic diastolic (congestive) heart failure Facility Procedures CPT4 Code: 88325498 Description: 26415 - CHEM CAUT GRANULATION TISS ICD-10 Diagnosis Description L97.122 Non-pressure chronic ulcer of left thigh with fat layer exp L97.112 Non-pressure chronic ulcer of right thigh with fat layer ex Modifier: osed posed Quantity: 2 Physician Procedures CPT4 Code: 8309407 Description: 68088 - WC PHYS CHEM CAUT GRAN TISSUE ICD-10 Diagnosis Description P10.315 Non-pressure chronic ulcer of left thigh with fat layer expo L97.112 Non-pressure chronic ulcer of right thigh with fat layer exp Modifier: sed osed Quantity: 2 Electronic Signature(s) Signed: 02/26/2018 1:18:11 PM By: Brian Keeler PA-C Entered By: [name] on  02/25/2018 08:55:34

## 2018-02-26 NOTE — Progress Notes (Signed)
[name], Mang ([id number]) Visit Report for 02/25/2018 Arrival Information Details Patient Name: [name], [name] Date of Service: 02/25/2018 8:30 AM Medical Record Number: [id number] Patient Account Number: [id number] Date of Birth/Sex: 08-12-1981 (37 y.o. M) Treating RN: Phillis HaggisPinkerton, Debi Primary Care Sorren Vallier: Abner GreenspanHODGES, BETH Other Clinician: Referring Lindsee Labarre: Abner GreenspanHODGES, BETH Treating Vega Stare/Extender: Linwood DibblesSTONE III, HOYT Weeks in Treatment: 12 Visit Information History Since Last Visit All ordered tests and consults were completed: No Patient Arrived: Ambulatory Added or deleted any medications: No Arrival Time: 08:25 Any new allergies or adverse reactions: No Accompanied By: self Had a fall or experienced change in No Transfer Assistance: None activities of daily living that may affect Patient Identification Verified: Yes risk of falls: Secondary Verification Process Yes Signs or symptoms of abuse/neglect since last visito No Completed: Hospitalized since last visit: No Patient Requires Transmission-Based No Implantable device outside of the clinic excluding No Precautions: cellular tissue based products placed in the center Patient Has Alerts: Yes since last visit: Patient Alerts: Patient on Blood Has Dressing in Place as Prescribed: Yes Thinner Pain Present Now: No Eliquis Electronic Signature(s) Signed: 02/25/2018 5:41:08 PM By: Alejandro MullingPinkerton, Debra Entered By: Alejandro MullingPinkerton, Debra on 02/25/2018 08:26:25 [name], [name] ([id number]) -------------------------------------------------------------------------------- Encounter Discharge Information Details Patient Name: [name], [name] Date of Service: 02/25/2018 8:30 AM Medical Record Number: [id number] Patient Account Number: [id number] Date of Birth/Sex: 08-12-1981 (37 y.o. M) Treating RN: Phillis HaggisPinkerton, Debi Primary Care Rayshawn Maney: Abner GreenspanHODGES, BETH Other Clinician: Referring Deaunte Dente: Abner GreenspanHODGES, BETH Treating May Ozment/Extender: Linwood DibblesSTONE III, HOYT Weeks in  Treatment: 12 Encounter Discharge Information Items Discharge Condition: Stable Ambulatory Status: Ambulatory Discharge Destination: Skilled Nursing Facility Telephoned: No Orders Sent: Yes Transportation: Private Auto Accompanied By: self Schedule Follow-up Appointment: Yes Clinical Summary of Care: Electronic Signature(s) Signed: 02/25/2018 10:29:28 AM By: Alejandro MullingPinkerton, Debra Entered By: Alejandro MullingPinkerton, Debra on 02/25/2018 10:29:27 [name], [name] ([id number]) -------------------------------------------------------------------------------- Lower Extremity Assessment Details Patient Name: [name], [name] Date of Service: 02/25/2018 8:30 AM Medical Record Number: [id number] Patient Account Number: [id number] Date of Birth/Sex: 08-12-1981 (37 y.o. M) Treating RN: Phillis HaggisPinkerton, Debi Primary Care Cobe Viney: Abner GreenspanHODGES, BETH Other Clinician: Referring Elen Acero: Abner GreenspanHODGES, BETH Treating Talisha Erby/Extender: Linwood DibblesSTONE III, HOYT Weeks in Treatment: 12 Electronic Signature(s) Signed: 02/25/2018 5:41:08 PM By: Alejandro MullingPinkerton, Debra Entered By: Alejandro MullingPinkerton, Debra on 02/25/2018 08:36:39 Boccio, Oakes ([id number]) -------------------------------------------------------------------------------- Multi Wound Chart Details Patient Name: [name], [name] Date of Service: 02/25/2018 8:30 AM Medical Record Number: [id number] Patient Account Number: [id number] Date of Birth/Sex: 08-12-1981 (37 y.o. M) Treating RN: Renne CriglerFlinchum, Cheryl Primary Care Dakota Vanwart: Abner GreenspanHODGES, BETH Other Clinician: Referring Zamya Culhane: Abner GreenspanHODGES, BETH Treating Yardley Lekas/Extender: STONE III, HOYT Weeks in Treatment: 12 Vital Signs Height(in): 70 Pulse(bpm): 66 Weight(lbs): 245 Blood Pressure(mmHg): 114/51 Body Mass Index(BMI): 35 Temperature(F): 98.1 Respiratory Rate 16 (breaths/min): Photos: [4:No Photos] [5:No Photos] [6:No Photos] Wound Location: [4:Left Abdomen - Lower Quadrant - Lateral] [5:Left Upper Leg - Medial] [6:Right Upper Leg - Anterior] Wounding Event:  [4:Gradually Appeared] [5:Gradually Appeared] [6:Gradually Appeared] 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] Comorbid History: [4:Anemia, Asthma, Hypertension, End Stage Renal Disease] [5:Anemia, Asthma, Hypertension, End Stage Renal Disease] [6:Anemia, Asthma, Hypertension, End Stage Renal Disease] Date Acquired: [4:02/04/2018] [5:02/18/2018] [6:02/18/2018] Weeks of Treatment: [4:3] [5:1] [6:1] Wound Status: [4:Open] [5:Open] [6:Open] Measurements L x W x D [4:2.5x3x0.1] [5:0.4x1.5x0.1] [6:0.6x3.5x0.1] (cm) Area (cm) : [4:5.89] [5:0.471] [6:1.649] Volume (cm) : [4:0.589] [5:0.047] [6:0.165] % Reduction in Area: [4:-1289.20%] [5:-22.30%] [6:-101.80%] % Reduction in Volume: [4:-1302.40%] [5:-23.70%] [6:-101.20%] Classification: [4:Full Thickness Without Exposed Support Structures] [5:Full Thickness Without Exposed Support Structures] [6:Full Thickness Without Exposed Support Structures] Exudate Amount: [4:Large] [5:Large] [6:Large] Exudate  Type: [4:Serosanguineous] [5:Serosanguineous] [6:Serosanguineous] Exudate Color: [4:red, brown] [5:red, brown] [6:red, brown] Wound Margin: [4:Distinct, outline attached] [5:Distinct, outline attached] [6:Distinct, outline attached] Granulation Amount: [4:Small (1-33%)] [5:Large (67-100%)] [6:Large (67-100%)] Granulation Quality: [4:Pink] [5:Red, Hyper-granulation] [6:Red, Hyper-granulation] Necrotic Amount: [4:Large (67-100%)] [5:Small (1-33%)] [6:Small (1-33%)] Necrotic Tissue: [4:Eschar, Adherent Slough] [5:Adherent Slough] [6:Adherent Slough] Exposed Structures: [4:Fascia: No Fat Layer (Subcutaneous Tissue) Exposed: No Tendon: No Muscle: No Joint: No Bone: No] [5:Fascia: No Fat Layer (Subcutaneous Tissue) Exposed: No Tendon: No Muscle: No Joint: No Bone: No] [6:Fascia: No Fat Layer (Subcutaneous Tissue) Exposed:  No Tendon: No Muscle: No Joint: No Bone: No] Epithelialization: [4:None] [5:None] [6:None] Periwound Skin  Texture: [4:No Abnormalities Noted] [5:No Abnormalities Noted] [6:No Abnormalities Noted] Periwound Skin Moisture: [4:No Abnormalities Noted] [5:No Abnormalities Noted] [6:No Abnormalities Noted] Periwound Skin Color: No Abnormalities Noted No Abnormalities Noted No Abnormalities Noted Temperature: No Abnormality No Abnormality No Abnormality Tenderness on Palpation: Yes Yes Yes Wound Preparation: Ulcer Cleansing: Ulcer Cleansing: Ulcer Cleansing: Rinsed/Irrigated with Saline Rinsed/Irrigated with Saline Rinsed/Irrigated with Saline Topical Anesthetic Applied: Topical Anesthetic Applied: Topical Anesthetic Applied: Other: lidocaine 4% Other: lidocaine 4% Other: lidocaine 4% Treatment Notes Electronic Signature(s) Signed: 02/25/2018 5:44:59 PM By: Renne Crigler Entered By: Renne Crigler on 02/25/2018 08:43:33 [place], [place] ([id number]) -------------------------------------------------------------------------------- Multi-Disciplinary Care Plan Details Patient Name: [name] Date of Service: 02/25/2018 8:30 AM Medical Record Number: [id number] Patient Account Number: [id number] Date of Birth/Sex: 06-03-1981 (37 y.o. M) Treating RN: Renne Crigler Primary Care Askia Hazelip: Abner Greenspan Other Clinician: Referring Trissa Molina: Abner Greenspan Treating Izen Petz/Extender: Linwood Dibbles, HOYT Weeks in Treatment: 12 Active Inactive ` Orientation to the Wound Care Program Nursing Diagnoses: Knowledge deficit related to the wound healing center program Goals: Patient/caregiver will verbalize understanding of the Wound Healing Center Program Date Initiated: 12/03/2017 Target Resolution Date: 12/24/2017 Goal Status: Active Interventions: Provide education on orientation to the wound center Notes: ` Wound/Skin Impairment Nursing Diagnoses: Impaired tissue integrity Goals: Patient/caregiver will verbalize understanding of skin care regimen Date Initiated: 12/03/2017 Target Resolution Date:  12/24/2017 Goal Status: Active Ulcer/skin breakdown will have a volume reduction of 30% by week 4 Date Initiated: 12/03/2017 Target Resolution Date: 12/24/2017 Goal Status: Active Interventions: Assess patient/caregiver ability to obtain necessary supplies Assess patient/caregiver ability to perform ulcer/skin care regimen upon admission and as needed Assess ulceration(s) every visit Treatment Activities: Skin care regimen initiated : 12/03/2017 Notes: Electronic Signature(s) Signed: 02/25/2018 5:44:59 PM By: French Ana, [name] Entered By: Renne Crigler on 02/25/2018 08:43:25 Waldren, Jomarie Longs ([id number]) -------------------------------------------------------------------------------- Pain Assessment Details Patient Name: [name] Date of Service: 02/25/2018 8:30 AM Medical Record Number: [id number] Patient Account Number: [id number] Date of Birth/Sex: 1981-07-10 (37 y.o. M) Treating RN: Phillis Haggis Primary Care Rudi Bunyard: Abner Greenspan Other Clinician: Referring Errin Chewning: Abner Greenspan Treating Elky Funches/Extender: STONE III, HOYT Weeks in Treatment: 12 Active Problems Location of Pain Severity and Description of Pain Patient Has Paino No Site Locations Pain Management and Medication Current Pain Management: Electronic Signature(s) Signed: 02/25/2018 5:41:08 PM By: Alejandro Mulling Entered By: Alejandro Mulling on 02/25/2018 08:26:32 [name] ([id number]) -------------------------------------------------------------------------------- Patient/Caregiver Education Details Patient Name: [name] Date of Service: 02/25/2018 8:30 AM Medical Record Number: [id number] Patient Account Number: [id number] Date of Birth/Gender: 08-27-1980 (36 y.o. M) Treating RN: Phillis Haggis Primary Care Physician: Abner Greenspan Other Clinician: Referring Physician: Abner Greenspan Treating Physician/Extender: Skeet Simmer in Treatment: 12 Education  Assessment Education Provided To: Patient Education Topics Provided Wound/Skin Impairment: Handouts: Caring for Your Ulcer, Skin Care Do's and Dont's, Other: change dressing  as ordered Methods: Demonstration, Explain/Verbal Responses: State content correctly Electronic Signature(s) Signed: 02/25/2018 5:41:08 PM By: Alejandro Mulling Entered By: Alejandro Mulling on 02/25/2018 10:29:43 Turski, Jomarie Longs ([id number]) -------------------------------------------------------------------------------- Wound Assessment Details Patient Name: [name] Date of Service: 02/25/2018 8:30 AM Medical Record Number: [id number] Patient Account Number: [id number] Date of Birth/Sex: March 23, 1981 (37 y.o. M) Treating RN: Phillis Haggis Primary Care Nisreen Guise: Abner Greenspan Other Clinician: Referring Kolby Myung: Abner Greenspan Treating Esteen Delpriore/Extender: STONE III, HOYT Weeks in Treatment: 12 Wound Status Wound Number: 4 Primary Calciphylaxis Etiology: Wound Location: Left Abdomen - Lower Quadrant - Lateral Wound Status: Open Wounding Event: Gradually Appeared Comorbid Anemia, Asthma, Hypertension, End Stage History: Renal Disease Date Acquired: 02/04/2018 Weeks Of Treatment: 3 Clustered Wound: No Photos Photo Uploaded By: Alejandro Mulling on 02/25/2018 11:23:00 Wound Measurements Length: (cm) 2.5 Width: (cm) 3 Depth: (cm) 0.1 Area: (cm) 5.89 Volume: (cm) 0.589 % Reduction in Area: -1289.2% % Reduction in Volume: -1302.4% Epithelialization: None Tunneling: No Undermining: No Wound Description Full Thickness Without Exposed Support Classification: Structures Wound Margin: Distinct, outline attached Exudate Large Amount: Exudate Type: Serosanguineous Exudate Color: red, brown Foul Odor After Cleansing: No Slough/Fibrino Yes Wound Bed Granulation Amount: Small (1-33%) Exposed Structure Granulation Quality: Pink Fascia Exposed: No Necrotic Amount: Large (67-100%) Fat Layer (Subcutaneous  Tissue) Exposed: No Necrotic Quality: Eschar, Adherent Slough Tendon Exposed: No Muscle Exposed: No Joint Exposed: No Bone Exposed: No [place], Katelyn ([id number]) Periwound Skin Texture Texture Color No Abnormalities Noted: No No Abnormalities Noted: No Moisture Temperature / Pain No Abnormalities Noted: No Temperature: No Abnormality Tenderness on Palpation: Yes Wound Preparation Ulcer Cleansing: Rinsed/Irrigated with Saline Topical Anesthetic Applied: Other: lidocaine 4%, Treatment Notes Wound #4 (Left, Lateral Abdomen - Lower Quadrant) 1. Cleansed with: Clean wound with Normal Saline 2. Anesthetic Topical Lidocaine 4% cream to wound bed prior to debridement 4. Dressing Applied: Xeroform 5. Secondary Dressing Applied [place] Electronic Signature(s) Signed: 02/25/2018 5:41:08 PM By: Alejandro Mulling Entered By: Alejandro Mulling on 02/25/2018 08:35:37 [place], [place] ([id number]) -------------------------------------------------------------------------------- Wound Assessment Details Patient Name: [name] Date of Service: 02/25/2018 8:30 AM Medical Record Number: [id number] Patient Account Number: [id number] Date of Birth/Sex: 01/21/1981 (37 y.o. M) Treating RN: Phillis Haggis Primary Care Kayleen Alig: Abner Greenspan Other Clinician: Referring Charlen Bakula: Abner Greenspan Treating Chirag Krueger/Extender: STONE III, HOYT Weeks in Treatment: 12 Wound Status Wound Number: 5 Primary Calciphylaxis Etiology: Wound Location: Left Upper Leg - Medial Wound Status: Open Wounding Event: Gradually Appeared Comorbid Anemia, Asthma, Hypertension, End Stage Date Acquired: 02/18/2018 History: Renal Disease Weeks Of Treatment: 1 Clustered Wound: No Photos Photo Uploaded By: Alejandro Mulling on 02/25/2018 11:23:44 Wound Measurements Length: (cm) 0.4 Width: (cm) 1.5 Depth: (cm) 0.1 Area: (cm) 0.471 Volume: (cm) 0.047 % Reduction in Area: -22.3% % Reduction in Volume:  -23.7% Epithelialization: None Tunneling: No Undermining: No Wound Description Full Thickness Without Exposed Support Classification: Structures Wound Margin: Distinct, outline attached Exudate Large Amount: Exudate Type: Serosanguineous Exudate Color: red, brown Foul Odor After Cleansing: No Slough/Fibrino No Wound Bed Granulation Amount: Large (67-100%) Exposed Structure Granulation Quality: Red, Hyper-granulation Fascia Exposed: No Necrotic Amount: Small (1-33%) Fat Layer (Subcutaneous Tissue) Exposed: No Necrotic Quality: Adherent Slough Tendon Exposed: No Muscle Exposed: No Joint Exposed: No Bone Exposed: No Twyman, Nikko ([id number]) Periwound Skin Texture Texture Color No Abnormalities Noted: No No Abnormalities Noted: No Moisture Temperature / Pain No Abnormalities Noted: No Temperature: No Abnormality Tenderness on Palpation: Yes Wound Preparation Ulcer Cleansing: Rinsed/Irrigated with Saline Topical Anesthetic Applied: Other: lidocaine 4%, Treatment Notes Wound #5 (Left,  Medial Upper Leg) 1. Cleansed with: Clean wound with Normal Saline 2. Anesthetic Topical Lidocaine 4% cream to wound bed prior to debridement 4. Dressing Applied: Hydrafera Blue 5. Secondary Dressing Applied Bordered Foam Dressing Electronic Signature(s) Signed: 02/25/2018 5:41:08 PM By: Alejandro Mulling Entered By: Alejandro Mulling on 02/25/2018 08:34:19 [place], [place] ([id number]) -------------------------------------------------------------------------------- Wound Assessment Details Patient Name: [name] Date of Service: 02/25/2018 8:30 AM Medical Record Number: [id number] Patient Account Number: [id number] Date of Birth/Sex: February 21, 1981 (37 y.o. M) Treating RN: Phillis Haggis Primary Care Dazhane Villagomez: Abner Greenspan Other Clinician: Referring Kathrina Crosley: Abner Greenspan Treating Donyel Castagnola/Extender: STONE III, HOYT Weeks in Treatment: 12 Wound Status Wound Number: 6 Primary  Calciphylaxis Etiology: Wound Location: Right Upper Leg - Anterior Wound Status: Open Wounding Event: Gradually Appeared Comorbid Anemia, Asthma, Hypertension, End Stage Date Acquired: 02/18/2018 History: Renal Disease Weeks Of Treatment: 1 Clustered Wound: No Photos Photo Uploaded By: Alejandro Mulling on 02/25/2018 11:23:44 Wound Measurements Length: (cm) 0.6 Width: (cm) 3.5 Depth: (cm) 0.1 Area: (cm) 1.649 Volume: (cm) 0.165 % Reduction in Area: -101.8% % Reduction in Volume: -101.2% Epithelialization: None Tunneling: No Undermining: No Wound Description Full Thickness Without Exposed Support Classification: Structures Wound Margin: Distinct, outline attached Exudate Large Amount: Exudate Type: Serosanguineous Exudate Color: red, brown Foul Odor After Cleansing: No Slough/Fibrino No Wound Bed Granulation Amount: Large (67-100%) Exposed Structure Granulation Quality: Red, Hyper-granulation Fascia Exposed: No Necrotic Amount: Small (1-33%) Fat Layer (Subcutaneous Tissue) Exposed: No Necrotic Quality: Adherent Slough Tendon Exposed: No Muscle Exposed: No Joint Exposed: No Bone Exposed: No Blessinger, Alexx ([id number]) Periwound Skin Texture Texture Color No Abnormalities Noted: No No Abnormalities Noted: No Moisture Temperature / Pain No Abnormalities Noted: No Temperature: No Abnormality Tenderness on Palpation: Yes Wound Preparation Ulcer Cleansing: Rinsed/Irrigated with Saline Topical Anesthetic Applied: Other: lidocaine 4%, Treatment Notes Wound #6 (Right, Anterior Upper Leg) 1. Cleansed with: Clean wound with Normal Saline 2. Anesthetic Topical Lidocaine 4% cream to wound bed prior to debridement 4. Dressing Applied: Hydrafera Blue 5. Secondary Dressing Applied Bordered Foam Dressing Electronic Signature(s) Signed: 02/25/2018 5:41:08 PM By: Alejandro Mulling Entered By: Alejandro Mulling on 02/25/2018 08:33:45 [place]ck[place], [place]  ([id number]) -------------------------------------------------------------------------------- Vitals Details Patient Name: [name] Date of Service: 02/25/2018 8:30 AM Medical Record Number: [id number] Patient Account Number: [id number] Date of Birth/Sex: [birth year] (37 y.o. M) Treating RN: Phillis Haggis Primary Care Barbara Ahart: Abner Greenspan Other Clinician: Referring Perl Folmar: Abner Greenspan Treating Eber Ferrufino/Extender: STONE III, HOYT Weeks in Treatment: 12 Vital Signs Time Taken: 08:26 Temperature (F): 98.1 Height (in): 70 Pulse (bpm): 66 Weight (lbs): 245 Respiratory Rate (breaths/min): 16 Body Mass Index (BMI): 35.2 Blood Pressure (mmHg): 114/51 Reference Range: 80 - 120 mg / dl Electronic Signature(s) Signed: 02/25/2018 5:41:08 PM By: Alejandro Mulling Entered By: Alejandro Mulling on 02/25/2018 53:66:44

## 2018-02-27 ENCOUNTER — Encounter
Admission: RE | Disposition: A | Discharge status: Rehabilitation Facility | Payer: Self-pay | Source: Ambulatory Visit | Attending: Vascular Surgery

## 2018-02-27 ENCOUNTER — Ambulatory Visit
Admission: RE | Admit: 2018-02-27 | Discharge: 2018-02-27 | Disposition: A | Discharge status: Rehabilitation Facility | Payer: Medicaid Other | Source: Ambulatory Visit | Attending: Vascular Surgery | Admitting: Vascular Surgery

## 2018-02-27 DIAGNOSIS — E669 Obesity, unspecified: Secondary | ICD-10-CM | POA: Diagnosis not present

## 2018-02-27 DIAGNOSIS — Z6834 Body mass index (BMI) 34.0-34.9, adult: Secondary | ICD-10-CM | POA: Diagnosis not present

## 2018-02-27 DIAGNOSIS — N186 End stage renal disease: Secondary | ICD-10-CM | POA: Insufficient documentation

## 2018-02-27 DIAGNOSIS — I82511 Chronic embolism and thrombosis of right femoral vein: Secondary | ICD-10-CM | POA: Insufficient documentation

## 2018-02-27 DIAGNOSIS — Z992 Dependence on renal dialysis: Secondary | ICD-10-CM | POA: Diagnosis not present

## 2018-02-27 DIAGNOSIS — E039 Hypothyroidism, unspecified: Secondary | ICD-10-CM | POA: Insufficient documentation

## 2018-02-27 DIAGNOSIS — Y832 Surgical operation with anastomosis, bypass or graft as the cause of abnormal reaction of the patient, or of later complication, without mention of misadventure at the time of the procedure: Secondary | ICD-10-CM | POA: Insufficient documentation

## 2018-02-27 DIAGNOSIS — Z8249 Family history of ischemic heart disease and other diseases of the circulatory system: Secondary | ICD-10-CM | POA: Diagnosis not present

## 2018-02-27 DIAGNOSIS — T82868A Thrombosis of vascular prosthetic devices, implants and grafts, initial encounter: Secondary | ICD-10-CM

## 2018-02-27 DIAGNOSIS — I132 Hypertensive heart and chronic kidney disease with heart failure and with stage 5 chronic kidney disease, or end stage renal disease: Secondary | ICD-10-CM | POA: Diagnosis not present

## 2018-02-27 DIAGNOSIS — Z885 Allergy status to narcotic agent status: Secondary | ICD-10-CM | POA: Insufficient documentation

## 2018-02-27 DIAGNOSIS — T82898A Other specified complication of vascular prosthetic devices, implants and grafts, initial encounter: Secondary | ICD-10-CM | POA: Insufficient documentation

## 2018-02-27 DIAGNOSIS — F329 Major depressive disorder, single episode, unspecified: Secondary | ICD-10-CM | POA: Insufficient documentation

## 2018-02-27 DIAGNOSIS — I272 Pulmonary hypertension, unspecified: Secondary | ICD-10-CM | POA: Insufficient documentation

## 2018-02-27 DIAGNOSIS — F411 Generalized anxiety disorder: Secondary | ICD-10-CM | POA: Diagnosis not present

## 2018-02-27 DIAGNOSIS — I5030 Unspecified diastolic (congestive) heart failure: Secondary | ICD-10-CM | POA: Diagnosis not present

## 2018-02-27 DIAGNOSIS — E785 Hyperlipidemia, unspecified: Secondary | ICD-10-CM | POA: Insufficient documentation

## 2018-02-27 DIAGNOSIS — Z79899 Other long term (current) drug therapy: Secondary | ICD-10-CM | POA: Insufficient documentation

## 2018-02-27 DIAGNOSIS — Z79890 Hormone replacement therapy (postmenopausal): Secondary | ICD-10-CM | POA: Insufficient documentation

## 2018-02-27 DIAGNOSIS — Z7722 Contact with and (suspected) exposure to environmental tobacco smoke (acute) (chronic): Secondary | ICD-10-CM | POA: Diagnosis not present

## 2018-02-27 DIAGNOSIS — Z7901 Long term (current) use of anticoagulants: Secondary | ICD-10-CM | POA: Diagnosis not present

## 2018-02-27 DIAGNOSIS — Z9889 Other specified postprocedural states: Secondary | ICD-10-CM | POA: Insufficient documentation

## 2018-02-27 DIAGNOSIS — G4730 Sleep apnea, unspecified: Secondary | ICD-10-CM | POA: Insufficient documentation

## 2018-02-27 HISTORY — PX: A/V FISTULAGRAM: CATH118298

## 2018-02-27 LAB — GLUCOSE, CAPILLARY
GLUCOSE-CAPILLARY: 64 mg/dL — AB (ref 70–99)
Glucose-Capillary: 105 mg/dL — ABNORMAL HIGH (ref 70–99)

## 2018-02-27 LAB — POTASSIUM (ARMC VASCULAR LAB ONLY): POTASSIUM (ARMC VASCULAR LAB): 6.3 — AB (ref 3.5–5.1)

## 2018-02-27 SURGERY — A/V FISTULAGRAM
Anesthesia: Moderate Sedation | Laterality: Left

## 2018-02-27 MED ORDER — HEPARIN (PORCINE) IN NACL 1000-0.9 UT/500ML-% IV SOLN
INTRAVENOUS | Status: AC
  Filled 2018-02-27: qty 1000

## 2018-02-27 MED ORDER — DEXTROSE 50 % IV SOLN
1.0000 | Freq: Once | INTRAVENOUS | Status: AC
  Administered 2018-02-27: 50 mL via INTRAVENOUS

## 2018-02-27 MED ORDER — MIDAZOLAM HCL 5 MG/5ML IJ SOLN
INTRAMUSCULAR | Status: AC
  Filled 2018-02-27: qty 5

## 2018-02-27 MED ORDER — CEFAZOLIN SODIUM-DEXTROSE 1-4 GM/50ML-% IV SOLN
1.0000 g | Freq: Once | INTRAVENOUS | Status: AC
  Administered 2018-02-27: 1 g via INTRAVENOUS

## 2018-02-27 MED ORDER — METHYLPREDNISOLONE SODIUM SUCC 125 MG IJ SOLR: 125.0000 mg | INTRAMUSCULAR | Status: DC | PRN

## 2018-02-27 MED ORDER — HEPARIN SODIUM (PORCINE) 1000 UNIT/ML IJ SOLN
INTRAMUSCULAR | Status: AC
  Filled 2018-02-27: qty 1

## 2018-02-27 MED ORDER — FAMOTIDINE 20 MG PO TABS: 40.0000 mg | ORAL_TABLET | ORAL | Status: DC | PRN

## 2018-02-27 MED ORDER — FENTANYL CITRATE (PF) 100 MCG/2ML IJ SOLN
INTRAMUSCULAR | Status: AC
  Filled 2018-02-27: qty 2

## 2018-02-27 MED ORDER — HYDROMORPHONE HCL 1 MG/ML IJ SOLN: 1.0000 mg | Freq: Once | INTRAMUSCULAR | Status: DC | PRN

## 2018-02-27 MED ORDER — ONDANSETRON HCL 4 MG/2ML IJ SOLN: 4.0000 mg | Freq: Four times a day (QID) | INTRAMUSCULAR | Status: DC | PRN

## 2018-02-27 MED ORDER — SODIUM CHLORIDE 0.9 % IV SOLN
INTRAVENOUS | Status: DC
  Administered 2018-02-27: 14:00:00 via INTRAVENOUS

## 2018-02-27 MED ORDER — HEPARIN SODIUM (PORCINE) 1000 UNIT/ML IJ SOLN
INTRAMUSCULAR | Status: DC | PRN
  Administered 2018-02-27: 3000 [IU] via INTRAVENOUS

## 2018-02-27 MED ORDER — LIDOCAINE-EPINEPHRINE (PF) 1 %-1:200000 IJ SOLN
INTRAMUSCULAR | Status: AC
  Filled 2018-02-27: qty 30

## 2018-02-27 MED ORDER — MIDAZOLAM HCL 2 MG/2ML IJ SOLN
INTRAMUSCULAR | Status: DC | PRN
  Administered 2018-02-27: 1 mg via INTRAVENOUS
  Administered 2018-02-27: 2 mg via INTRAVENOUS

## 2018-02-27 MED ORDER — CEFAZOLIN SODIUM-DEXTROSE 1-4 GM/50ML-% IV SOLN
INTRAVENOUS | Status: AC
  Administered 2018-02-27: 1 g via INTRAVENOUS
  Filled 2018-02-27: qty 50

## 2018-02-27 MED ORDER — DEXTROSE 50 % IV SOLN
INTRAVENOUS | Status: AC
  Administered 2018-02-27: 50 mL via INTRAVENOUS
  Filled 2018-02-27: qty 50

## 2018-02-27 MED ORDER — IOPAMIDOL (ISOVUE-300) INJECTION 61%
INTRAVENOUS | Status: DC | PRN
  Administered 2018-02-27: 20 mL via INTRA_ARTERIAL

## 2018-02-27 MED ORDER — SODIUM CHLORIDE 0.9 % IV SOLN: INTRAVENOUS | Status: DC

## 2018-02-27 MED ORDER — FENTANYL CITRATE (PF) 100 MCG/2ML IJ SOLN
INTRAMUSCULAR | Status: DC | PRN
  Administered 2018-02-27: 25 ug via INTRAVENOUS
  Administered 2018-02-27: 50 ug via INTRAVENOUS

## 2018-02-27 SURGICAL SUPPLY — 10 items
BALLN DORADO 7X60X80 (BALLOONS) ×3
BALLN LUTONIX DCB 7X60X130 (BALLOONS) ×3
BALLOON DORADO 7X60X80 (BALLOONS) ×1 IMPLANT
BALLOON LUTONIX DCB 7X60X130 (BALLOONS) ×1 IMPLANT
CANNULA 5F STIFF (CANNULA) ×3 IMPLANT
DEVICE PRESTO INFLATION (MISCELLANEOUS) ×3 IMPLANT
PACK ANGIOGRAPHY (CUSTOM PROCEDURE TRAY) ×3 IMPLANT
SHEATH BRITE TIP 6FRX5.5 (SHEATH) ×3 IMPLANT
SUT MNCRL AB 4-0 PS2 18 (SUTURE) ×3 IMPLANT
WIRE MAGIC TOR.035 180C (WIRE) ×3 IMPLANT

## 2018-02-27 NOTE — H&P (Signed)
 [facility] History & Physical Update  The patient was interviewed and re-examined.  [name] previous History and Physical has been reviewed and is unchanged.  There is no change in the plan of care. We plan to proceed with the scheduled procedure of fistulagram.  Brian BarrenJason Dew, MD  02/27/2018, 1:24 PM

## 2018-02-27 NOTE — Progress Notes (Signed)
MD notified of critical potassium level  

## 2018-02-27 NOTE — Op Note (Signed)
[facility]    OPERATIVE NOTE   PROCEDURE: 1.  Left brachial artery to axillary vein arteriovenous graft cannulation under ultrasound guidance 2.  Left arm shuntogram 3.  Percutaneous transluminal angioplasty of venous anastomosis with 7 mm diameter drug-coated and high-pressure angioplasty balloons  PRE-OPERATIVE DIAGNOSIS: 1. ESRD 2. 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 The graft was patent from the arterial anastomosis until the venous anastomosis where there was a high-grade narrowing of about 80 to 85%.  The axillary vein then normalized and the remainder of the central venous circulation was widely patent.  SPECIMEN(S):  None  CONTRAST: 20 cc  FLUORO TIME: 1.0 minute  MODERATE CONSCIOUS SEDATION TIME:  Approximately 15 minutes using 3 mg of Versed and 75 Mcg of Fentanyl  INDICATIONS: [name] is a 37 y.o. male who presents with malfunctioning left brachial artery to axillary vein arteriovenous graft.  The patient is scheduled for left arm shuntogram.  The patient is aware the risks include but are not limited to: bleeding, infection, thrombosis of the cannulated access, and possible anaphylactic reaction to the contrast.  The patient is aware of the risks of the procedure and elects to proceed forward.  DESCRIPTION: After full informed written consent was obtained, the patient was brought back to the angiography suite and placed supine upon the angiography table.  The patient was connected to monitoring equipment. Moderate conscious sedation was administered during a face to face encounter throughout the procedure with my supervision of the RN administering medicines and monitoring the patient's vital signs, pulse oximetry, telemetry and mental status throughout from the  start of the procedure until the patient was taken to the recovery room The left arm was prepped and draped in the standard fashion for a percutaneous access intervention.  Under ultrasound guidance, the left brachial artery to axillary vein arteriovenous graft was cannulated with a micropuncture needle under direct ultrasound guidance and a permanent image was performed.  The microwire was advanced into the graft and the needle was exchanged for the a microsheath.  I then upsized to a 6 Fr Sheath and imaging was performed.  Hand injections were completed to image the access including the central venous system. This demonstrated the graft was patent from the arterial anastomosis until the venous anastomosis where there was a high-grade narrowing of about 80 to 85%.  The axillary vein then normalized and the remainder of the central venous circulation was widely patent.  Based on the images, this patient will need intervention to the venous anastomosis. I then gave the patient 3000 units of intravenous heparin.  I then crossed the stenosis with a Magic Tourqe wire.  Based on the imaging, a 7 mm x 6 cm Lutonix drug-coated angioplasty balloon was selected.  The balloon was centered around the stenosis and inflated to 12 ATM for 1 minute(s) but the waist did not completely break.  I then exchanged for a 7 mm diameter by 6 cm length high-pressure angioplasty balloon and at 14 atm the waist broke.  This was held for 1 minute.  On completion imaging, a 15-20 % residual stenosis was present.     Based on the completion imaging, no further intervention is necessary.  The wire and balloon were removed from the sheath.  A 4-0 Monocryl purse-string suture was sewn around  the sheath.  The sheath was removed while tying down the suture.  A sterile bandage was applied to the puncture site.  COMPLICATIONS: None  CONDITION: Stable   Leotis Pain  02/27/2018 2:36 PM    This note was created with Dragon Medical  transcription system. Any errors in dictation are purely unintentional.

## 2018-02-28 ENCOUNTER — Encounter: Payer: Self-pay | Admitting: Vascular Surgery

## 2018-03-04 ENCOUNTER — Ambulatory Visit: Payer: Medicaid Other | Admitting: Physician Assistant

## 2018-03-06 ENCOUNTER — Encounter: Payer: Medicaid Other | Admitting: Internal Medicine

## 2018-03-06 DIAGNOSIS — L97122 Non-pressure chronic ulcer of left thigh with fat layer exposed: Secondary | ICD-10-CM | POA: Diagnosis not present

## 2018-03-11 ENCOUNTER — Ambulatory Visit: Payer: Medicaid Other | Admitting: Physician Assistant

## 2018-03-18 ENCOUNTER — Encounter: Payer: Medicaid Other | Attending: Physician Assistant | Admitting: Physician Assistant

## 2018-03-18 DIAGNOSIS — I132 Hypertensive heart and chronic kidney disease with heart failure and with stage 5 chronic kidney disease, or end stage renal disease: Secondary | ICD-10-CM | POA: Diagnosis not present

## 2018-03-18 DIAGNOSIS — L97122 Non-pressure chronic ulcer of left thigh with fat layer exposed: Secondary | ICD-10-CM | POA: Insufficient documentation

## 2018-03-18 DIAGNOSIS — I5033 Acute on chronic diastolic (congestive) heart failure: Secondary | ICD-10-CM | POA: Insufficient documentation

## 2018-03-18 DIAGNOSIS — N186 End stage renal disease: Secondary | ICD-10-CM | POA: Diagnosis not present

## 2018-03-18 DIAGNOSIS — L97112 Non-pressure chronic ulcer of right thigh with fat layer exposed: Secondary | ICD-10-CM | POA: Diagnosis not present

## 2018-03-24 NOTE — Progress Notes (Signed)
DEMETRIA, LIGHTSEY ([id number]) Visit Report for 03/06/2018 Arrival Information Details Patient Name: LYSANDER, CALIXTE Date of Service: 03/06/2018 12:45 PM Medical Record Number: [id number] Patient Account Number: [id number] Date of Birth/Sex: 23-Mar-1981 (37 y.o. M) Treating RN: Huel Coventry Primary Care Zacariah Belue: Abner Greenspan Other Clinician: Referring Gloria Ricardo: Abner Greenspan Treating Yashvi Jasinski/Extender: Altamese [place] in Treatment: 13 Visit Information History Since Last Visit Added or deleted any medications: No Patient Arrived: Ambulatory Any new allergies or adverse reactions: No Arrival Time: 17:30 Had a fall or experienced change in No Accompanied By: self activities of daily living that may affect Transfer Assistance: None risk of falls: Patient Requires Transmission-Based No Signs or symptoms of abuse/neglect since last visito No Precautions: Hospitalized since last visit: No Patient Has Alerts: Yes Implantable device outside of the clinic excluding No Patient Alerts: Patient on Blood cellular tissue based products placed in the center Thinner since last visit: Eliquis Pain Present Now: No Electronic Signature(s) Signed: 03/12/2018 5:31:46 PM By: Elliot Gurney, BSN, RN, CWS, Kim RN, BSN Entered By: Elliot Gurney, BSN, RN, CWS, Kim on 03/12/2018 17:31:46 Foskey, Julien Nordmann ([id number]) -------------------------------------------------------------------------------- Clinic Level of Care Assessment Details Patient Name: Laurel, Darek A. Date of Service: 03/06/2018 12:45 PM Medical Record Number: [id number] Patient Account Number: [id number] Date of Birth/Sex: 1981-04-07 (37 y.o. M) Treating RN: Huel Coventry Primary Care Dakia Schifano: Abner Greenspan Other Clinician: Referring Carleigh Buccieri: Abner Greenspan Treating Gerlad Pelzel/Extender: Altamese [place] in Treatment: 13 Clinic Level of Care Assessment Items TOOL 4 Quantity Score []  - Use when only an EandM is performed on FOLLOW-UP visit 0 ASSESSMENTS -  Nursing Assessment / Reassessment []  - Reassessment of Co-morbidities (includes updates in patient status) 0 X- 1 5 Reassessment of Adherence to Treatment Plan ASSESSMENTS - Wound and Skin Assessment / Reassessment []  - Simple Wound Assessment / Reassessment - one wound 0 X- 3 5 Complex Wound Assessment / Reassessment - multiple wounds []  - 0 Dermatologic / Skin Assessment (not related to wound area) ASSESSMENTS - Focused Assessment []  - Circumferential Edema Measurements - multi extremities 0 []  - 0 Nutritional Assessment / Counseling / Intervention []  - 0 Lower Extremity Assessment (monofilament, tuning fork, pulses) []  - 0 Peripheral Arterial Disease Assessment (using hand held doppler) ASSESSMENTS - Ostomy and/or Continence Assessment and Care []  - Incontinence Assessment and Management 0 []  - 0 Ostomy Care Assessment and Management (repouching, etc.) PROCESS - Coordination of Care X - Simple Patient / Family Education for ongoing care 1 15 []  - 0 Complex (extensive) Patient / Family Education for ongoing care X- 1 10 Staff obtains Chiropractor, Records, Test Results / Process Orders []  - 0 Staff telephones HHA, Nursing Homes / Clarify orders / etc []  - 0 Routine Transfer to another Facility (non-emergent condition) []  - 0 Routine Hospital Admission (non-emergent condition) []  - 0 New Admissions / Manufacturing engineer / Ordering NPWT, Apligraf, etc. []  - 0 Emergency Hospital Admission (emergent condition) X- 1 10 Simple Discharge Coordination Hillhouse, Jahmel A. ([id number]) []  - 0 Complex (extensive) Discharge Coordination PROCESS - Special Needs []  - Pediatric / Minor Patient Management 0 []  - 0 Isolation Patient Management []  - 0 Hearing / Language / Visual special needs []  - 0 Assessment of Community assistance (transportation, D/C planning, etc.) []  - 0 Additional assistance / Altered mentation []  - 0 Support Surface(s) Assessment (bed, cushion, seat,  etc.) INTERVENTIONS - Wound Cleansing / Measurement []  - Simple Wound Cleansing - one wound 0 X- 3 5 Complex Wound Cleansing - multiple wounds X-  1 5 Wound Imaging (photographs - any number of wounds) []  - 0 Wound Tracing (instead of photographs) []  - 0 Simple Wound Measurement - one wound X- 3 5 Complex Wound Measurement - multiple wounds INTERVENTIONS - Wound Dressings []  - Small Wound Dressing one or multiple wounds 0 X- 3 15 Medium Wound Dressing one or multiple wounds []  - 0 Large Wound Dressing one or multiple wounds []  - 0 Application of Medications - topical []  - 0 Application of Medications - injection INTERVENTIONS - Miscellaneous []  - External ear exam 0 []  - 0 Specimen Collection (cultures, biopsies, blood, body fluids, etc.) []  - 0 Specimen(s) / Culture(s) sent or taken to Lab for analysis []  - 0 Patient Transfer (multiple staff / Nurse, adult / Similar devices) []  - 0 Simple Staple / Suture removal (25 or less) []  - 0 Complex Staple / Suture removal (26 or more) []  - 0 Hypo / Hyperglycemic Management (close monitor of Blood Glucose) []  - 0 Ankle / Brachial Index (ABI) - do not check if billed separately X- 1 5 Vital Signs Goltz, Kemani A. ([id number]) Has the patient been seen at the hospital within the last three years: Yes Total Score: 140 Level Of Care: New/Established - Level 4 Electronic Signature(s) Signed: 03/15/2018 6:17:48 PM By: Elliot Gurney, BSN, RN, CWS, Kim RN, BSN Entered By: Elliot Gurney, BSN, RN, CWS, Kim on 03/14/2018 18:33:56 Lech, Julien Nordmann ([id number]) -------------------------------------------------------------------------------- Lower Extremity Assessment Details Patient Name: [name] Date of Service: 03/06/2018 12:45 PM Medical Record Number: [id number] Patient Account Number: [id number] Date of Birth/Sex: 07-Nov-1980 (36 y.o. M) Treating RN: Huel Coventry Primary Care Shir Bergman: Abner Greenspan Other Clinician: Referring Daliana Leverett: Abner Greenspan Treating Abbegail Matuska/Extender: Altamese [place] in Treatment: 13 Electronic Signature(s) Signed: 03/12/2018 5:38:48 PM By: Elliot Gurney, BSN, RN, CWS, Kim RN, BSN Entered By: Elliot Gurney, BSN, RN, CWS, Kim on 03/12/2018 17:38:47 Lovick, Julien Nordmann ([id number]) -------------------------------------------------------------------------------- Multi Wound Chart Details Patient Name: [name] Date of Service: 03/06/2018 12:45 PM Medical Record Number: [id number] Patient Account Number: [id number] Date of Birth/Sex: Oct 25, 1980 (36 y.o. M) Treating RN: Huel Coventry Primary Care Astor Gentle: Abner Greenspan Other Clinician: Referring Oberia Beaudoin: Abner Greenspan Treating Lilac Hoff/Extender: Altamese [place] in Treatment: 13 Vital Signs Height(in): 70 Pulse(bpm): 60 Weight(lbs): 245 Blood Pressure(mmHg): 106/64 Body Mass Index(BMI): 35 Temperature(F): 98 Respiratory Rate 16 (breaths/min): Photos: [4:No Photos] [5:No Photos] [6:No Photos] Wound Location: [4:Left, Lateral Abdomen - Lower Quadrant] [5:Left, Medial Upper Leg] [6:Right, Anterior Upper Leg] Wounding Event: [4:Gradually Appeared] [5:Gradually Appeared] [6:Gradually Appeared] 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] Date Acquired: [4:02/04/2018] [5:02/18/2018] [6:02/18/2018] Weeks of Treatment: [4:4] [5:2] [6:2] Wound Status: [4:Open] [5:Open] [6:Open] Measurements L x W x D [4:3x5x0.1] [5:0.5x1.5x0.1] [6:0.5x2.5x0.1] (cm) Area (cm) : [4:11.781] [5:0.589] [6:0.982] Volume (cm) : [4:1.178] [5:0.059] [6:0.098] % Reduction in Area: [4:-2678.50%] [5:-53.00%] [6:-20.20%] % Reduction in Volume: [4:-2704.80%] [5:-55.30%] [6:-19.50%] Classification: [4:Full Thickness Without Exposed Support Structures] [5:Full Thickness Without Exposed Support Structures] [6:Full Thickness Without Exposed Support Structures] Periwound Skin Texture: [4:No Abnormalities Noted] [5:No Abnormalities Noted] [6:No Abnormalities  Noted] Periwound Skin Moisture: [4:No Abnormalities Noted] [5:No Abnormalities Noted] [6:No Abnormalities Noted] Periwound Skin Color: [4:No Abnormalities Noted No] [5:No Abnormalities Noted No] [6:No Abnormalities Noted No] Treatment Notes Electronic Signature(s) Signed: 03/13/2018 8:05:08 AM By: Baltazar Najjar MD Previous Signature: 03/12/2018 5:39:02 PM Version By: Elliot Gurney, BSN, RN, CWS, Kim RN, BSN Entered By: Baltazar Najjar on 03/12/2018 19:41:39 Lorusso, Julien Nordmann ([id number]) -------------------------------------------------------------------------------- Multi-Disciplinary Care Plan Details Patient Name: [name] Date of Service: 03/06/2018 12:45 PM Medical  Record Number: [id number] Patient Account Number: [id number] Date of Birth/Sex: 12-05-1980 (37 y.o. M) Treating RN: Huel Coventry Primary Care Maree Ainley: Abner Greenspan Other Clinician: Referring Caralyn Twining: Abner Greenspan Treating Sierrah Luevano/Extender: Altamese [place] in Treatment: 13 Active Inactive ` Orientation to the Wound Care Program Nursing Diagnoses: Knowledge deficit related to the wound healing center program Goals: Patient/caregiver will verbalize understanding of the Wound Healing Center Program Date Initiated: 12/03/2017 Target Resolution Date: 12/24/2017 Goal Status: Active Interventions: Provide education on orientation to the wound center Notes: ` Wound/Skin Impairment Nursing Diagnoses: Impaired tissue integrity Goals: Patient/caregiver will verbalize understanding of skin care regimen Date Initiated: 12/03/2017 Target Resolution Date: 12/24/2017 Goal Status: Active Ulcer/skin breakdown will have a volume reduction of 30% by week 4 Date Initiated: 12/03/2017 Target Resolution Date: 12/24/2017 Goal Status: Active Interventions: Assess patient/caregiver ability to obtain necessary supplies Assess patient/caregiver ability to perform ulcer/skin care regimen upon admission and as needed Assess ulceration(s)  every visit Treatment Activities: Skin care regimen initiated : 12/03/2017 Notes: Electronic Signature(s) Signed: 03/12/2018 5:38:55 PM By: Elliot Gurney, BSN, RN, CWS, Kim RN, BSN [address], [place] ([id number]) Entered By: Elliot Gurney, BSN, RN, CWS, Kim on 03/12/2018 17:38:54 Clinkenbeard, Julien Nordmann ([id number]) -------------------------------------------------------------------------------- Pain Assessment Details Patient Name: [name] Date of Service: 03/06/2018 12:45 PM Medical Record Number: [id number] Patient Account Number: [id number] Date of Birth/Sex: 1980-08-23 (36 y.o. M) Treating RN: Huel Coventry Primary Care Topanga Alvelo: Abner Greenspan Other Clinician: Referring Jacoya Bauman: Abner Greenspan Treating Khaleelah Yowell/Extender: Altamese  in Treatment: 13 Active Problems Location of Pain Severity and Description of Pain Patient Has Paino No Site Locations Pain Management and Medication Current Pain Management: Electronic Signature(s) Signed: 03/12/2018 5:31:56 PM By: Elliot Gurney, BSN, RN, CWS, Kim RN, BSN Entered By: Elliot Gurney, BSN, RN, CWS, Kim on 03/12/2018 17:31:56 Wales, Julien Nordmann ([id number]) -------------------------------------------------------------------------------- Wound Assessment Details Patient Name: [name] Date of Service: 03/06/2018 12:45 PM Medical Record Number: [id number] Patient Account Number: [id number] Date of Birth/Sex: [birth year] (36 y.o. M) Treating RN: Huel Coventry Primary Care Alicen Donalson: Abner Greenspan Other Clinician: Referring Kiira Brach: Abner Greenspan Treating Ezekeil Bethel/Extender: Altamese  in Treatment: 13 Wound Status Wound Number: 4 Primary Etiology: Calciphylaxis Wound Location: Left, Lateral Abdomen - Lower Quadrant Wound Status: Open Wounding Event: Gradually Appeared Date Acquired: 02/04/2018 Weeks Of Treatment: 4 Clustered Wound: No Photos Photo Uploaded By: Francie Massing on 03/13/2018 11:14:11 Wound Measurements Length: (cm) 3 Width: (cm) 5 Depth: (cm)  0.1 Area: (cm) 11.781 Volume: (cm) 1.178 % Reduction in Area: -2678.5% % Reduction in Volume: -2704.8% Wound Description Full Thickness Without Exposed Support Classification: Structures Periwound Skin Texture Texture Color No Abnormalities Noted: No No Abnormalities Noted: No Moisture No Abnormalities Noted: No Electronic Signature(s) Signed: 03/15/2018 6:17:48 PM By: Elliot Gurney, BSN, RN, CWS, Kim RN, BSN Entered By: Elliot Gurney, BSN, RN, CWS, Kim on 03/12/2018 17:38:35 Brzoska, Julien Nordmann ([id number]) -------------------------------------------------------------------------------- Wound Assessment Details Patient Name: [name] Date of Service: 03/06/2018 12:45 PM Medical Record Number: [id number] Patient Account Number: [id number] Date of Birth/Sex: 08-22-1980 (36 y.o. M) Treating RN: Huel Coventry Primary Care Perla Echavarria: Abner Greenspan Other Clinician: Referring Chasyn Cinque: Abner Greenspan Treating Jayro Mcmath/Extender: Maxwell Caul Weeks in Treatment: 13 Wound Status Wound Number: 5 Primary Etiology: Calciphylaxis Wound Location: Left, Medial Upper Leg Wound Status: Open Wounding Event: Gradually Appeared Date Acquired: 02/18/2018 Weeks Of Treatment: 2 Clustered Wound: No Photos Photo Uploaded By: Francie Massing on 03/13/2018 11:14:33 Wound Measurements Length: (cm) 0.5 Width: (cm) 1.5 Depth: (cm) 0.1 Area: (cm) 0.589 Volume: (cm) 0.059 % Reduction in  Area: -53% % Reduction in Volume: -55.3% Wound Description Full Thickness Without Exposed Support Classification: Structures Periwound Skin Texture Texture Color No Abnormalities Noted: No No Abnormalities Noted: No Moisture No Abnormalities Noted: No Electronic Signature(s) Signed: 03/15/2018 6:17:48 PM By: Elliot GurneyWoody, BSN, RN, CWS, Kim RN, BSN Entered By: Elliot GurneyWoody, BSN, RN, CWS, Kim on 03/12/2018 17:38:36 Rzasa, Julien NordmannJOSEPH A. ([id number]) -------------------------------------------------------------------------------- Wound Assessment  Details Patient Name: Nils FlackWEST, Jalik Date of Service: 03/06/2018 12:45 PM Medical Record Number: [id number] Patient Account Number: [id number] Date of Birth/Sex: 1981-07-29 (36 y.o. M) Treating RN: Huel CoventryWoody, Kim Primary Care Lovelee Forner: Abner GreenspanHODGES, BETH Other Clinician: Referring Elwin Tsou: Abner GreenspanHODGES, BETH Treating Daurice Ovando/Extender: Altamese CarolinaOBSON, MICHAEL G Weeks in Treatment: 13 Wound Status Wound Number: 6 Primary Etiology: Calciphylaxis Wound Location: Right, Anterior Upper Leg Wound Status: Open Wounding Event: Gradually Appeared Date Acquired: 02/18/2018 Weeks Of Treatment: 2 Clustered Wound: No Photos Photo Uploaded By: Francie MassingKelly, Tia on 03/13/2018 11:14:54 Wound Measurements Length: (cm) 0.5 Width: (cm) 2.5 Depth: (cm) 0.1 Area: (cm) 0.982 Volume: (cm) 0.098 % Reduction in Area: -20.2% % Reduction in Volume: -19.5% Wound Description Full Thickness Without Exposed Support Classification: Structures Periwound Skin Texture Texture Color No Abnormalities Noted: No No Abnormalities Noted: No Moisture No Abnormalities Noted: No Electronic Signature(s) Signed: 03/15/2018 6:17:48 PM By: Elliot GurneyWoody, BSN, RN, CWS, Kim RN, BSN Entered By: Elliot GurneyWoody, BSN, RN, CWS, Kim on 03/12/2018 17:38:36 Baltzell, Julien NordmannJOSEPH A. ([id number]) -------------------------------------------------------------------------------- Vitals Details Patient Name: Nils FlackWEST, Jasun Date of Service: 03/06/2018 12:45 PM Medical Record Number: [id number] Patient Account Number: [id number] Date of Birth/Sex: 1981-07-29 (36 y.o. M) Treating RN: Huel CoventryWoody, Kim Primary Care Akul Leggette: Abner GreenspanHODGES, BETH Other Clinician: Referring Janeann Paisley: Abner GreenspanHODGES, BETH Treating Kezia Benevides/Extender: Altamese CarolinaOBSON, MICHAEL G Weeks in Treatment: 13 Vital Signs Time Taken: 12:45 Temperature (F): 98 Height (in): 70 Pulse (bpm): 60 Weight (lbs): 245 Respiratory Rate (breaths/min): 16 Body Mass Index (BMI): 35.2 Blood Pressure (mmHg): 106/64 Reference Range: 80 - 120 mg / dl Electronic  Signature(s) Signed: 03/12/2018 5:32:58 PM By: Elliot GurneyWoody, BSN, RN, CWS, Kim RN, BSN Entered By: Elliot GurneyWoody, BSN, RN, CWS, Kim on 03/12/2018 17:32:57

## 2018-03-28 NOTE — Progress Notes (Signed)
TEAGEN, MCLEARY ([id number]) Visit Report for 03/18/2018 Chief Complaint Document Details Patient Name: [name], Brian A. Date of Service: 03/18/2018 9:00 AM Medical Record Number: [id number] Patient Account Number: [id number] Date of Birth/Sex: 1981/07/11 (37 y.o. M) Treating RN: Roger Shelter Primary Care Provider: Marco Collie Other Clinician: Referring Provider: Marco Collie Treating Provider/Extender: Melburn Hake, Corneilus Heggie Weeks in Treatment: 15 Information Obtained from: Patient Chief Complaint Bilateral thigh Calciphylaxis Ulcers Electronic Signature(s) Signed: 03/18/2018 5:57:26 PM By: Worthy Keeler PA-C Entered By: Worthy Keeler on 03/18/2018 09:30:19 [name], [name] ([id number]) -------------------------------------------------------------------------------- HPI Details Patient Name: [name], Brian A. Date of Service: 03/18/2018 9:00 AM Medical Record Number: [id number] Patient Account Number: [id number] Date of Birth/Sex: [birth year] (37 y.o. 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 He has currently been undergoing treatment according to notes reviewed from 11/28/17 for this. 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 Subsequently patient was using Santyl for very long time but is no longer using that at this point. He is receiving sodium thiosulfate during hemodialysis along with Sevelamer to help control his phosphorus levels. Currently a steroid cream was recommended for the wounds although he has not used that yet. He a has what appears to be extremely hyper granular and  poorly attached granulation noted over the bed of the wound. This is true of both wounds of lower extremities. He does show some signs of epithelialization around the border although this has been very slow according to the patient. Fortunately he is not having significant pain at this point. He does have a history of hypertension as well is congestive heart failure. Otherwise he states the wounds are much better than when I first started. 12/10/17 on evaluation today patient actually presents with signs of improvement in regard to his bilateral thigh ulcer. He has been tolerating the dressing changes without complication which is good news. With that being said is not really having a significant amount of bleeding at this point today is excellent. He has been pleased with the progress as well. There's no evidence of infection. 12/19/17; patient normally followed by Jeri Cos on Mondays. In his absence he comes in to see me today. 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 Initially painful necrotic wounds that have been debrided to a healthy surface. He is using Hydrofera Blue. He has one on the right anterior thigh and one on the left 12/24/17 on evaluation today patient's wounds actually appear to be doing excellent in regard to the bilateral upper thighs. He has been tolerating the dressing changes without complication and overall he seems to be making great progress especially on the left. He has no pain. 12/31/17 on evaluation today patient appears to be doing excellent in regard to his bilateral lower extremity ulcers. The left in fact appears to be completely healed which is great news the right is smaller and doing very well. Overall I'm pleased with the progress he's made the patient is very happy. 01/14/18 on evaluation today patient's ulcer on the right thigh actually appears to be doing much better. It's getting smaller. Unfortunately the left thigh ulcer  reopen due to the dressing having gotten stuck to  the wound bed. Subsequently this pulled off the top layer of skin and he does have an open wound at the site yet again. With that being said there does not appear to be evidence of infection overall things have been going well. 02/04/18 on evaluation today patient's ulcers on his thighs actually appear to be doing fairly well at this point. There does not appear to be any evidence of infection and actually appear to be close today. Unfortunately he has a new wound on the left lateral abdominal region he also has an area of what appears to be superficial bruising noted on the right lateral abdominal region. With that being said I cannot tell for sure that this is actually a Calciphylaxis issue or not at this point it could be but it also may just be the normal bruising/irritation due to his close rubbing. He states this is always been an area that does rub significantly. That's part of the reason why he's looking at the surgery that he's actually scheduled for reevaluation four on 02/12/18. He thought this was sooner when I met with him last but this is actually when the appointment is. 02/18/18 on evaluation today unfortunately both of the patient's upper thigh ulcers have reopened. He does seem to have some hyper granular tissue underneath and I was hoping that this would actually resolve and do well although it appears that the epithelium just has not been able to take due to the fact that there is so much hyper granular tissue underlying. For that reason again this has reopened that does need to be addressed today. Nonetheless the patient is not having any significant pain at the site which is good news. He is having more pain on the left lateral flank where he has a new area of opening due to what appears to be also Calciphylaxis. [name], [name] ([id number]) 02/25/18 on evaluation today patient appears to be doing rather well in regard to his anterior  thigh ulcer's in my pinion. He has been tolerating the dressing changes without complication there does not appear to be any evidence of infection at this time which is good news. The hyper granular tissue is greatly improved although I still think I would recommend the silver nitrate this week as well. Unfortunately the lateral abdominal ulcer actually is giving him more trouble I think this is a newly and evolving area of Calciphylaxis although it seems to be much more superficial and my hope is it will not continue to progress any more deeply. Fortunately he overall seems to be tolerating the Xeroform well I still think that may be the best choice for him. 03/06/18; the patient has bilateral ulcers secondary to calciphylaxis on his medial thighs and recently on the abdomen 03/18/18 on evaluation today patient appears to be doing very well in regard to his wounds. The right leg appears healed the left lateral abdominal region appears healed. The left leg is significantly smaller and though not completely healed appears to be doing excellent. In general I feel like things are progressing nicely. He's not even been placed in any dressings of the right leg due to the fact that this seems to be doing so well currently. He is still utilizing the hatch affair blue for the left leg. Electronic Signature(s) Signed: 03/18/2018 5:57:26 PM By: Worthy Keeler PA-C Entered By: Worthy Keeler on 03/18/2018 09:38:34 [name], [name] ([id number]) -------------------------------------------------------------------------------- Physical Exam Details Patient Name: [name], Brian A. Date of Service: 03/18/2018 9:00 AM  Medical Record Number: [id number] Patient Account Number: [id number] Date of Birth/Sex: [birth year] (37 y.o. M) Treating RN: Roger Shelter Primary Care Provider: Marco Collie Other Clinician: Referring Provider: Marco Collie Treating Provider/Extender: STONE III, Genecis Veley Weeks in Treatment:  15 Constitutional Obese and well-hydrated in no acute distress. Respiratory normal breathing without difficulty. clear to auscultation bilaterally. Cardiovascular regular rate and rhythm with normal S1, S2. Psychiatric this patient is able to make decisions and demonstrates good insight into disease process. Alert and Oriented x 3. pleasant and cooperative. Notes At this point patient's wounds in general appear to show signs of good improvement which is excellent news. He has been tolerating the dressing changes without complication there does not appear to be any evidence of infection all in all I'm very pleased with how things have progressed. Electronic Signature(s) Signed: 03/18/2018 5:57:26 PM By: Worthy Keeler PA-C Entered By: Worthy Keeler on 03/18/2018 09:39:17 Angus, [name] ([id number]) -------------------------------------------------------------------------------- Physician Orders Details Patient Name: [name], Brian A. Date of Service: 03/18/2018 9:00 AM Medical Record Number: [id number] Patient Account Number: [id number] Date of Birth/Sex: March 07, 1981 (37 y.o. 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 o Clean wound with Normal Saline. Wound #5 Left,Medial Upper Leg o Clean wound with Normal Saline. o Clean wound with Normal Saline. Anesthetic (add to Medication  List) Wound #4 Left,Lateral Abdomen - Lower Quadrant o Topical Lidocaine 4% cream applied to wound bed prior to debridement (In Clinic Only). Wound #5 Left,Medial Upper Leg o Topical Lidocaine 4% cream applied to wound bed prior to debridement (In Clinic Only). Skin Barriers/Peri-Wound Care Wound #4 Left,Lateral Abdomen - Lower Quadrant o Skin Prep - skin prep also to right upper thigh healed wound Wound #5 Left,Medial Upper Leg o Skin Prep - skin prep also to right upper thigh healed wound Primary Wound Dressing Wound #5 Left,Medial Upper Leg o Hydrafera Blue Ready Transfer Secondary Dressing Wound #5 Left,Medial Upper Leg o [place] Dressing Change Frequency Wound #4 Left,Lateral Abdomen - Lower Quadrant Forton, Jonovan A. ([id number]) o Change dressing every other day. Wound #5 Left,Medial Upper Leg o Change dressing every other day. Follow-up Appointments Wound #4 Left,Lateral Abdomen - Lower Quadrant o Return Appointment in 2 weeks. Wound #5 Left,Medial Upper Leg o Return Appointment in 2 weeks. Notes skin prep also to right upper thigh healed wound and may place on any other healed wound areas for protection Electronic Signature(s) Signed: 03/18/2018 5:57:26 PM By: Worthy Keeler PA-C Signed: 03/19/2018 8:47:36 AM By: Roger Shelter Entered By: Roger Shelter on 03/18/2018 09:38:13 [name], [name] ([id number]) -------------------------------------------------------------------------------- Problem List Details Patient Name: [name], Brian A. Date of Service: 03/18/2018 9:00 AM Medical Record Number: [id number] Patient Account Number: [id number] Date of Birth/Sex: 1980-09-05 (37 y.o. 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 12/03/2017 No Yes L97.122 Non-pressure  chronic ulcer of left thigh with fat layer exposed 12/03/2017 No Yes L97.112 Non-pressure chronic ulcer of right thigh with fat layer 12/03/2017 No Yes exposed [place] (primary) hypertension 12/03/2017 No Yes I50.33 Acute on chronic diastolic (congestive) heart failure 12/03/2017 No Yes Inactive Problems Resolved Problems Electronic Signature(s) Signed: 03/18/2018 5:57:26 PM By: Worthy Keeler PA-C Entered By: Worthy Keeler on 03/18/2018 09:30:06 Wax, [name] ([id number]) -------------------------------------------------------------------------------- Progress Note Details Patient Name: [name], Brian A. Date of Service: 03/18/2018 9:00 AM Medical Record Number: [id number] Patient Account Number: [id number] Date of Birth/Sex: 05-05-1981 (37 y.o. M) Treating RN: Roger Shelter Primary Care Provider: Marco Collie Other Clinician: Referring Provider: Marco Collie Treating Provider/Extender: Melburn Hake, Aline Wesche Weeks in Treatment: 15 Subjective Chief Complaint Information obtained from Patient Bilateral thigh Calciphylaxis Ulcers History of Present Illness (HPI) 12/03/17 on evaluation today patient presents for initial evaluation and the wound center concerning issues he has been having with his bilateral lower extremities with Calciphylaxis. He has currently been undergoing treatment according to notes reviewed from 11/28/17 for this. 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 Subsequently patient was using Santyl for very long time but is no longer using that at this point. He is receiving sodium thiosulfate during hemodialysis along with Sevelamer to help control his phosphorus levels. Currently a steroid cream was recommended for the wounds although he has not used that yet. He a has what appears to be extremely hyper granular and poorly attached  granulation noted over the bed of the wound. This is true of both wounds of lower extremities. He does show some signs of epithelialization around the border although this has been very slow according to the patient. Fortunately he is not having significant pain at this point. He does have a history of hypertension as well is congestive heart failure. Otherwise he states the wounds are much better than when I first started. 12/10/17 on evaluation today patient actually presents with signs of improvement in regard to his bilateral thigh ulcer. He has been tolerating the dressing changes without complication which is good news. With that being said is not really having a significant amount of bleeding at this point today is excellent. He has been pleased with the progress as well. There's no evidence of infection. 12/19/17; patient normally followed by Jeri Cos on Mondays. In his absence he comes in to see me today. 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 Initially painful necrotic wounds that have been debrided to a healthy surface. He is using Hydrofera Blue. He has one on the right anterior thigh and one on the left 12/24/17 on evaluation today patient's wounds actually appear to be doing excellent in regard to the bilateral upper thighs. He has been tolerating the dressing changes without complication and overall he seems to be making great progress especially on the left. He has no pain. 12/31/17 on evaluation today patient appears to be doing excellent in regard to his bilateral lower extremity ulcers. The left in fact appears to be completely healed which is great news the right is smaller and doing very well. Overall I'm pleased with the progress he's made the patient is very happy. 01/14/18 on evaluation today patient's ulcer on the right thigh actually appears to be doing much better. It's getting smaller. Unfortunately the left thigh ulcer reopen due to  the dressing having gotten stuck to the wound bed. Subsequently this pulled off the top layer of skin and  he does have an open wound at the site yet again. With that being said there does not appear to be evidence of infection overall things have been going well. 02/04/18 on evaluation today patient's ulcers on his thighs actually appear to be doing fairly well at this point. There does not appear to be any evidence of infection and actually appear to be close today. Unfortunately he has a new wound on the left lateral abdominal region he also has an area of what appears to be superficial bruising noted on the right lateral abdominal region. With that being said I cannot tell for sure that this is actually a Calciphylaxis issue or not at this point it could be but it also may just be the normal bruising/irritation due to his close rubbing. He states this is always been an area that does rub significantly. That's part of the reason why he's looking at the surgery that he's actually scheduled for reevaluation four on 02/12/18. He thought this was sooner when I met with him last but this is actually when the appointment is. 02/18/18 on evaluation today unfortunately both of the patient's upper thigh ulcers have reopened. He does seem to have some [name], Brian A. ([id number]) hyper granular tissue underneath and I was hoping that this would actually resolve and do well although it appears that the epithelium just has not been able to take due to the fact that there is so much hyper granular tissue underlying. For that reason again this has reopened that does need to be addressed today. Nonetheless the patient is not having any significant pain at the site which is good news. He is having more pain on the left lateral flank where he has a new area of opening due to what appears to be also Calciphylaxis. 02/25/18 on evaluation today patient appears to be doing rather well in regard to his anterior thigh ulcer's  in my pinion. He has been tolerating the dressing changes without complication there does not appear to be any evidence of infection at this time which is good news. The hyper granular tissue is greatly improved although I still think I would recommend the silver nitrate this week as well. Unfortunately the lateral abdominal ulcer actually is giving him more trouble I think this is a newly and evolving area of Calciphylaxis although it seems to be much more superficial and my hope is it will not continue to progress any more deeply. Fortunately he overall seems to be tolerating the Xeroform well I still think that may be the best choice for him. 03/06/18; the patient has bilateral ulcers secondary to calciphylaxis on his medial thighs and recently on the abdomen 03/18/18 on evaluation today patient appears to be doing very well in regard to his wounds. The right leg appears healed the left lateral abdominal region appears healed. The left leg is significantly smaller and though not completely healed appears to be doing excellent. In general I feel like things are progressing nicely. He's not even been placed in any dressings of the right leg due to the fact that this seems to be doing so well currently. He is still utilizing the hatch affair blue for the left leg. Patient History Information obtained from Patient. Family History Diabetes - Maternal Grandparents, Heart Disease - Maternal Grandparents, Hypertension - Maternal Grandparents, Stroke - Maternal Grandparents, No family history of Cancer, Hereditary Spherocytosis, Kidney Disease, Lung Disease, Seizures, Thyroid Problems, Tuberculosis. Social History Never smoker, Marital Status - Single, Alcohol Use -  Never, Drug Use - No History, Caffeine Use - Daily. Review of Systems (ROS) Constitutional Symptoms (General Health) Denies complaints or symptoms of Fever, Chills. Respiratory The patient has no complaints or  symptoms. Cardiovascular The patient has no complaints or symptoms. Psychiatric The patient has no complaints or symptoms. Objective Constitutional Obese and well-hydrated in no acute distress. Vitals Time Taken: 8:59 AM, Height: 70 in, Weight: 245 lbs, BMI: 35.2, Temperature: 98.3 F, Pulse: 68 bpm, Respiratory Rate: 16 breaths/min, Blood Pressure: 116/57 mmHg. [name], [name] ([id number]) Respiratory normal breathing without difficulty. clear to auscultation bilaterally. Cardiovascular regular rate and rhythm with normal S1, S2. Psychiatric this patient is able to make decisions and demonstrates good insight into disease process. Alert and Oriented x 3. pleasant and cooperative. General Notes: At this point patient's wounds in general appear to show signs of good improvement which is excellent news. He has been tolerating the dressing changes without complication there does not appear to be any evidence of infection all in all I'm very pleased with how things have progressed. Integumentary (Hair, Skin) Wound #4 status is Open. Original cause of wound was Gradually Appeared. The wound is located on the Left,Lateral Abdomen - Lower Quadrant. The wound measures 0.1cm length x 0.1cm width x 0.1cm depth; 0.008cm^2 area and 0.001cm^3 volume. There is no tunneling or undermining noted. There is a none present amount of drainage noted. The wound margin is flat and intact. There is no granulation within the wound bed. There is no necrotic tissue within the wound bed. Periwound temperature was noted as No Abnormality. The periwound has tenderness on palpation. Wound #5 status is Open. Original cause of wound was Gradually Appeared. The wound is located on the Left,Medial Upper Leg. The wound measures 0.5cm length x 1.2cm width x 0.1cm depth; 0.471cm^2 area and 0.047cm^3 volume. There is no tunneling or undermining noted. There is a none present amount of drainage noted. The wound margin is flat  and intact. There is no granulation within the wound bed. Periwound temperature was noted as No Abnormality. The periwound has tenderness on palpation. Wound #6 status is Healed - Epithelialized. Original cause of wound was Gradually Appeared. The wound is located on the Right,Anterior Upper Leg. The wound measures 0cm length x 0cm width x 0cm depth; 0cm^2 area and 0cm^3 volume. There is no tunneling or undermining noted. There is a none present amount of drainage noted. The wound margin is flat and intact. There is no granulation within the wound bed. There is a large (67-100%) amount of necrotic tissue within the wound bed including Eschar. Periwound temperature was noted as No Abnormality. The periwound has tenderness on palpation. 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 Clean wound with Normal Saline. Wound #5 Left,Medial Upper Leg: Clean wound with Normal Saline. Clean wound with Normal Saline. Anesthetic (add to Medication List): Wound #4 Left,Lateral Abdomen - Lower Quadrant: Topical Lidocaine 4% cream applied to wound bed prior to debridement (In Clinic Only). Wound #5 Left,Medial Upper Leg: Topical Lidocaine 4% cream applied to wound bed prior to debridement (In Clinic Only). Skin Barriers/Peri-Wound Care: Wound #4 Left,Lateral Abdomen - Lower Quadrant: Skin Prep - skin prep also to right upper thigh healed  wound Wound #5 Left,Medial Upper Leg: Skin Prep - skin prep also to right upper thigh healed wound Primary Wound Dressing: Wound #5 Left,Medial Upper Leg: Hydrafera Blue Ready Transfer Secondary  Dressing: Wound #5 Left,Medial Upper Leg: [place] Dressing Change Frequency: Wound #4 Left,Lateral Abdomen - Lower Quadrant: Change dressing every other day. Wound #5 Left,Medial Upper Leg: Change dressing every other day. Follow-up Appointments: Wound #4 Left,Lateral Abdomen - Lower Quadrant: Return Appointment in 2 weeks. Wound #5 Left,Medial Upper Leg: Return Appointment in 2 weeks. General Notes: skin prep also to right upper thigh healed wound and may place on any other healed wound areas for protection I am going to suggest currently that we go ahead and at this point in time continue with the [facility] Dressing the left leg and he is in agreement with that plan. Subsequently we will also utilize skin prep over the other two shield areas daily and this was placed in his order to send back to the facility as well. Patient is in agreement with the plan. We will subsequently see were things stand at follow-up in two weeks time. Please see above for specific wound care orders. We will see patient for re-evaluation in 2 week(s) here in the clinic. If anything worsens or changes patient will contact our office for additional recommendations. Electronic Signature(s) Signed: 03/18/2018 5:57:26 PM By: Worthy Keeler PA-C Entered By: Worthy Keeler on 03/18/2018 09:39:47 [name], [name] ([id number]) -------------------------------------------------------------------------------- ROS/PFSH Details Patient Name: [name], Brian A. Date of Service: 03/18/2018 9:00 AM Medical Record Number: [id number] Patient Account Number: [id number] Date of Birth/Sex: [birth year] (37 y.o. M) Treating RN: Roger Shelter Primary Care Provider: Marco Collie Other Clinician: Referring Provider: Marco Collie Treating Provider/Extender: STONE III, Cade Dashner Weeks in Treatment: 15 Information Obtained From Patient Wound History Do you currently have one or more open woundso Yes How many open wounds do you  currently haveo 2 Approximately how long have you had your woundso 6 months How have you been treating your wound(s) until nowo triamcinalone Has your wound(s) ever healed and then re-openedo No Have you had any lab work done in the past montho No Have you tested positive for an antibiotic resistant organism (MRSA, VRE)o No Have you tested positive for osteomyelitis (bone infection)o No Have you had any tests for circulation on your legso No Constitutional Symptoms (General Health) Complaints and Symptoms: Negative for: Fever; Chills Hematologic/Lymphatic Medical History: Positive for: Anemia Negative for: Hemophilia; Human Immunodeficiency Virus; Lymphedema; Sickle Cell Disease Respiratory Complaints and Symptoms: No Complaints or Symptoms Medical History: Positive for: Asthma - history Negative for: Aspiration; Chronic Obstructive Pulmonary Disease (COPD); Pneumothorax; Sleep Apnea; Tuberculosis Cardiovascular Complaints and Symptoms: No Complaints or Symptoms Medical History: Positive for: Hypertension Gastrointestinal Medical History: Negative for: Cirrhosis ; Colitis; Crohnos; Hepatitis A; Hepatitis B; Hepatitis C Genitourinary Cassara, Davien A. ([id number]) Medical History: Positive for: End Stage Renal Disease Immunological Medical History: Negative for: Lupus Erythematosus; Raynaudos; Scleroderma Musculoskeletal Medical History: Negative for: Gout; Rheumatoid Arthritis; Osteoarthritis; Osteomyelitis Neurologic Medical History: Negative for: Dementia; Neuropathy Oncologic Medical History: Negative for: Received Chemotherapy; Received Radiation Psychiatric Complaints and Symptoms: No Complaints or Symptoms Immunizations Pneumococcal Vaccine: Received Pneumococcal Vaccination: Yes Implantable Devices Family and Social History Cancer: No; Diabetes: Yes - Maternal Grandparents; Heart Disease: Yes - Maternal Grandparents; Hereditary Spherocytosis: No;  Hypertension: Yes - Maternal Grandparents; Kidney Disease: No; Lung Disease: No; Seizures: No; Stroke: Yes - Maternal Grandparents; Thyroid Problems: No; Tuberculosis: No; Never smoker; Marital Status - Single; Alcohol  Use: Never; Drug Use: No History; Caffeine Use: Daily; Advanced Directives: No; Patient does not want information on Advanced Directives Physician Affirmation I have reviewed and agree with the above information. Electronic Signature(s) Signed: 03/18/2018 5:57:26 PM By: Worthy Keeler PA-C Signed: 03/19/2018 8:47:36 AM By: Roger Shelter Entered By: Worthy Keeler on 03/18/2018 09:38:56 Hagy, [name] ([id number]) -------------------------------------------------------------------------------- SuperBill Details Patient Name: Krehbiel, Eldrige A. Date of Service: 03/18/2018 Medical Record Number: [id number] Patient Account Number: [id number] Date of Birth/Sex: [birth year] (37 y.o. 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 I10 Essential (primary) hypertension I50.33 Acute on chronic diastolic (congestive) heart failure Facility Procedures CPT4 Code: 12878676 Description: 72094 - WOUND CARE VISIT-LEV 2 EST PT Modifier: Quantity: 1 Physician Procedures CPT4 Code: 7096283 Description: 99213 - WC PHYS LEVEL 3 - EST PT ICD-10 Diagnosis Description 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

## 2018-03-29 NOTE — Progress Notes (Signed)
Brian, [name] ([id number]) Visit Report for 03/18/2018 Arrival Information Details Patient Name: [name], [name]. Date of Service: 03/18/2018 9:00 AM Medical Record Number: [id number] Patient Account Number: [id number] Date of Birth/Sex: 02-26-1981 (37 y.o. M) Treating RN: [name] Primary Care [name]: [name] Other Clinician: Referring [name]: [name] Treating [name]/Extender: [name], [name] Weeks in Treatment: 15 Visit Information History Since Last Visit All ordered tests and consults were completed: No Patient Arrived: Ambulatory Added or deleted any medications: No Arrival Time: 08:57 Any new allergies or adverse reactions: No Accompanied By: self Had [name] fall or experienced change in No Transfer Assistance: None activities of daily living that may affect Patient Identification Verified: Yes risk of falls: Secondary Verification Process Yes Signs or symptoms of abuse/neglect since last visito No Completed: Hospitalized since last visit: No Patient Requires Transmission-Based No Implantable device outside of the clinic excluding No Precautions: cellular tissue based products placed in the center Patient Has Alerts: Yes since last visit: Patient Alerts: Patient on Blood Has Dressing in Place as Prescribed: Yes Thinner Pain Present Now: No Eliquis Electronic Signature(s) Signed: 03/18/2018 4:59:33 PM By: [name] Entered By: [name] on 03/18/2018 08:59:30 [name], [name] ([id number]) -------------------------------------------------------------------------------- Clinic Level of Care Assessment Details Patient Name: [name], [name]. Date of Service: 03/18/2018 9:00 AM Medical Record Number: [id number] Patient Account Number: [id number] Date of Birth/Sex: [birth year] (37 y.o. M) Treating RN: Renne [name] Primary Care [name]: [name] Other Clinician: Referring Emylia Latella: [name] Treating [name]/Extender: [name],  [name] Weeks in Treatment: 15 Clinic Level of Care Assessment Items TOOL 4 Quantity Score X - Use when only an EandM is performed on FOLLOW-UP visit 1 0 ASSESSMENTS - Nursing Assessment / Reassessment X - Reassessment of Co-morbidities (includes updates in patient status) 1 10 X- 1 5 Reassessment of Adherence to Treatment Plan ASSESSMENTS - Wound and Skin Assessment / Reassessment X - Simple Wound Assessment / Reassessment - one wound 1 5 []  - 0 Complex Wound Assessment / Reassessment - multiple wounds []  - 0 Dermatologic / Skin Assessment (not related to wound area) ASSESSMENTS - Focused Assessment []  - Circumferential Edema Measurements - multi extremities 0 []  - 0 Nutritional Assessment / Counseling / Intervention []  - 0 Lower Extremity Assessment (monofilament, tuning fork, pulses) []  - 0 Peripheral Arterial Disease Assessment (using hand held doppler) ASSESSMENTS - Ostomy and/or Continence Assessment and Care []  - Incontinence Assessment and Management 0 []  - 0 Ostomy Care Assessment and Management (repouching, etc.) PROCESS - Coordination of Care X - Simple Patient / Family Education for ongoing care 1 15 []  - 0 Complex (extensive) Patient / Family Education for ongoing care []  - 0 Staff obtains Chiropractor, Records, Test Results / Process Orders []  - 0 Staff telephones HHA, Nursing Homes / Clarify orders / etc []  - 0 Routine Transfer to another Facility (non-emergent condition) []  - 0 Routine Hospital Admission (non-emergent condition) []  - 0 New Admissions / Manufacturing engineer / Ordering NPWT, Apligraf, etc. []  - 0 Emergency Hospital Admission (emergent condition) X- 1 10 Simple Discharge Coordination [name], [name]. ([id number]) []  - 0 Complex (extensive) Discharge Coordination PROCESS - Special Needs []  - Pediatric / Minor Patient Management 0 []  - 0 Isolation Patient Management []  - 0 Hearing / Language / Visual special needs []  - 0 Assessment of  Community assistance (transportation, D/C planning, etc.) []  - 0 Additional assistance / Altered mentation []  - 0 Support Surface(s) Assessment (bed, cushion, seat, etc.) INTERVENTIONS - Wound Cleansing /  Measurement X - Simple Wound Cleansing - one wound 1 5 []  - 0 Complex Wound Cleansing - multiple wounds X- 1 5 Wound Imaging (photographs - any number of wounds) []  - 0 Wound Tracing (instead of photographs) X- 1 5 Simple Wound Measurement - one wound []  - 0 Complex Wound Measurement - multiple wounds INTERVENTIONS - Wound Dressings X - Small Wound Dressing one or multiple wounds 1 10 []  - 0 Medium Wound Dressing one or multiple wounds []  - 0 Large Wound Dressing one or multiple wounds []  - 0 Application of Medications - topical []  - 0 Application of Medications - injection INTERVENTIONS - Miscellaneous []  - External ear exam 0 []  - 0 Specimen Collection (cultures, biopsies, blood, body fluids, etc.) []  - 0 Specimen(s) / Culture(s) sent or taken to Lab for analysis []  - 0 Patient Transfer (multiple staff / Nurse, adult / Similar devices) []  - 0 Simple Staple / Suture removal (25 or less) []  - 0 Complex Staple / Suture removal (26 or more) []  - 0 Hypo / Hyperglycemic Management (close monitor of Blood Glucose) []  - 0 Ankle / Brachial Index (ABI) - do not check if billed separately X- 1 5 Vital Signs [name], [name]. ([id number]) Has the patient been seen at the hospital within the last three years: Yes Total Score: 75 Level Of Care: New/Established - Level 2 Electronic Signature(s) Signed: 03/19/2018 8:47:36 AM By: Renne [name] Entered By: Renne [name] on 03/18/2018 09:36:17 [name] ([id number]) -------------------------------------------------------------------------------- Encounter Discharge Information Details Patient Name: [name], [name]. Date of Service: 03/18/2018 9:00 AM Medical Record Number: [id number] Patient Account Number: [id number] Date  of Birth/Sex: 03/14/1981 (37 y.o. M) Treating RN: Curtis Sites Primary Care Trentyn Boisclair: [name] Other Clinician: Referring Talayah Picardi: [name] Treating Liliya Fullenwider/Extender: [name], [name] Weeks in Treatment: 15 Encounter Discharge Information Items Discharge Condition: Stable Ambulatory Status: Ambulatory Discharge Destination: Skilled Nursing Facility Telephoned: No Orders Sent: Yes Transportation: Private Auto Accompanied By: self Schedule Follow-up Appointment: Yes Clinical Summary of Care: Electronic Signature(s) Signed: 03/18/2018 9:46:33 AM By: Curtis Sites Entered By: Curtis Sites on 03/18/2018 09:46:32 Tillis, [name] ([id number]) -------------------------------------------------------------------------------- Lower Extremity Assessment Details Patient Name: [name], [name]. Date of Service: 03/18/2018 9:00 AM Medical Record Number: [id number] Patient Account Number: [id number] Date of Birth/Sex: December 06, 1980 (37 y.o. M) Treating RN: [name] Primary Care Jaslen Adcox: [name] Other Clinician: Referring Janashia Parco: [name] Treating Frannie Shedrick/Extender: [name], [name] Weeks in Treatment: 15 Electronic Signature(s) Signed: 03/18/2018 4:59:33 PM By: [name] Entered By: [name] on 03/18/2018 09:00:36 Cowper, [name] ([id number]) -------------------------------------------------------------------------------- Multi Wound Chart Details Patient Name: [name], [name]. Date of Service: 03/18/2018 9:00 AM Medical Record Number: [id number] Patient Account Number: [id number] Date of Birth/Sex: 1981-07-05 (37 y.o. M) Treating RN: Renne [name] Primary Care Daesean Lazarz: [name] Other Clinician: Referring Kiera Hussey: [name] Treating Malaiya Paczkowski/Extender: STONE III, [name] Weeks in Treatment: 15 Vital Signs Height(in): 70 Pulse(bpm): 68 Weight(lbs): 245 Blood Pressure(mmHg): 116/57 Body Mass Index(BMI): 35 Temperature(F): 98.3 Respiratory  Rate 16 (breaths/min): Photos: [4:No Photos] [5:No Photos] [6:No Photos] Wound Location: [4:Left Abdomen - Lower Quadrant - Lateral] [5:Left Upper Leg - Medial] [6:Right Upper Leg - Anterior] Wounding Event: [4:Gradually Appeared] [5:Gradually Appeared] [6:Gradually Appeared] 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] Comorbid History: [4:Anemia, Asthma, Hypertension, End Stage Renal Disease] [5:Anemia, Asthma, Hypertension, End Stage Renal Disease] [6:Anemia, Asthma, Hypertension, End Stage Renal Disease] Date Acquired: [4:02/04/2018] [5:02/18/2018] [6:02/18/2018] Weeks of Treatment: [4:6] [5:4] [6:4] Wound Status: [4:Open] [5:Open] [6:Open] Measurements L x W  x D [4:0.1x0.1x0.1] [5:0.5x1.2x0.1] [6:0.1x0.1x0.1] (cm) Area (cm) : [4:0.008] [5:0.471] [6:0.008] Volume (cm) : [4:0.001] [5:0.047] [6:0.001] % Reduction in Area: [4:98.10%] [5:-22.30%] [6:99.00%] % Reduction in Volume: [4:97.60%] [5:-23.70%] [6:98.80%] Classification: [4:Full Thickness Without Exposed Support Structures] [5:Full Thickness Without Exposed Support Structures] [6:Full Thickness Without Exposed Support Structures] Exudate Amount: [4:None Present] [5:None Present] [6:None Present] Wound Margin: [4:Flat and Intact] [5:Flat and Intact] [6:Flat and Intact] Granulation Amount: [4:None Present (0%)] [5:None Present (0%)] [6:None Present (0%)] Necrotic Amount: [4:None Present (0%)] [5:N/[name]] [6:Large (67-100%)] Necrotic Tissue: [4:N/[name]] [5:N/[name]] [6:Eschar] Exposed Structures: [4:Fascia: No Fat Layer (Subcutaneous Tissue) Exposed: No Tendon: No Muscle: No Joint: No Bone: No] [5:Fascia: No Fat Layer (Subcutaneous Tissue) Exposed: No Tendon: No Muscle: No Joint: No Bone: No] [6:Fascia: No Fat Layer (Subcutaneous Tissue) Exposed:  No Tendon: No Muscle: No Joint: No Bone: No] Epithelialization: [4:None] [5:None] [6:None] Periwound Skin Texture: [4:No Abnormalities Noted] [5:No Abnormalities Noted] [6:No  Abnormalities Noted] Periwound Skin Moisture: [4:No Abnormalities Noted] [5:No Abnormalities Noted] [6:No Abnormalities Noted] Periwound Skin Color: [4:No Abnormalities Noted] [5:No Abnormalities Noted] [6:No Abnormalities Noted] Temperature: [4:No Abnormality] [5:No Abnormality] [6:No Abnormality] Tenderness on Palpation: [4:Yes] [5:Yes] [6:Yes] Wound Preparation: Ulcer Cleansing: Ulcer Cleansing: Ulcer Cleansing: Rinsed/Irrigated with Saline Rinsed/Irrigated with Saline Rinsed/Irrigated with Saline Topical Anesthetic Applied: Topical Anesthetic Applied: Topical Anesthetic Applied: None Other: lidocaine 4% Other: lidocaine 4% Treatment Notes Electronic Signature(s) Signed: 03/19/2018 8:47:36 AM By: Renne [name] Entered By: Renne [name] on 03/18/2018 09:31:38 Mayhall, [name] ([id number]) -------------------------------------------------------------------------------- Multi-Disciplinary Care Plan Details Patient Name: Geer, Keimon [name]. Date of Service: 03/18/2018 9:00 AM Medical Record Number: [id number] Patient Account Number: [id number] Date of Birth/Sex: 1980-12-01 (37 y.o. M) Treating RN: Renne [name] Primary Care Veronica Guerrant: [name] Other Clinician: Referring Sholonda Jobst: [name] Treating Mertice Uffelman/Extender: STONE III, [name] Weeks in Treatment: 15 Active Inactive ` Orientation to the Wound Care Program Nursing Diagnoses: Knowledge deficit related to the wound healing center program Goals: Patient/caregiver will verbalize understanding of the Wound Healing Center Program Date Initiated: 12/03/2017 Target Resolution Date: 12/24/2017 Goal Status: Active Interventions: Provide education on orientation to the wound center Notes: ` Wound/Skin Impairment Nursing Diagnoses: Impaired tissue integrity Goals: Patient/caregiver will verbalize understanding of skin care regimen Date Initiated: 12/03/2017 Target Resolution Date: 12/24/2017 Goal Status: Active Ulcer/skin  breakdown will have [name] volume reduction of 30% by week 4 Date Initiated: 12/03/2017 Target Resolution Date: 12/24/2017 Goal Status: Active Interventions: Assess patient/caregiver ability to obtain necessary supplies Assess patient/caregiver ability to perform ulcer/skin care regimen upon admission and as needed Assess ulceration(s) every visit Treatment Activities: Skin care regimen initiated : 12/03/2017 Notes: Electronic Signature(s) Signed: 03/19/2018 8:47:36 AM By: French Ana, [name] ([id number]) Entered By: Renne [name] on 03/18/2018 09:31:25 Bettenhausen, [name] ([id number]) -------------------------------------------------------------------------------- Pain Assessment Details Patient Name: [name], [name]. Date of Service: 03/18/2018 9:00 AM Medical Record Number: [id number] Patient Account Number: [id number] Date of Birth/Sex: 01-May-1981 (37 y.o. M) Treating RN: [name] Primary Care Jerusha Reising: [name] Other Clinician: Referring Damontae Loppnow: [name] Treating Seda Kronberg/Extender: STONE III, [name] Weeks in Treatment: 15 Active Problems Location of Pain Severity and Description of Pain Patient Has Paino No Site Locations Pain Management and Medication Current Pain Management: Electronic Signature(s) Signed: 03/18/2018 4:59:33 PM By: [name] Entered By: [name] on 03/18/2018 08:59:35 Burgen, [name] ([id number]) -------------------------------------------------------------------------------- Patient/Caregiver Education Details Patient Name: [name], [name]. Date of Service: 03/18/2018 9:00 AM Medical Record Number: [id number] Patient Account Number: [id number] Date of Birth/Gender: 11/24/1980 (37 y.o. M) Treating RN: Curtis Sites Primary  Care Physician: Brian GreenspanHODGES, BETH Other Clinician: Referring Physician: Abner GreenspanHODGES, BETH Treating Physician/Extender: Lenda KelpSTONE III, [name] Weeks in Treatment: 15 Education Assessment Education Provided  To: Patient Education Topics Provided Wound/Skin Impairment: Handouts: Other: wound care as ordered Methods: Demonstration, Explain/Verbal Responses: State content correctly Electronic Signature(s) Signed: 03/19/2018 4:05:36 PM By: Curtis Sitesorthy, Joanna Entered By: Curtis Sitesorthy, Joanna on 03/18/2018 09:48:16 Violett, [name]. ([id number]) -------------------------------------------------------------------------------- Wound Assessment Details Patient Name: [name], [name]. Date of Service: 03/18/2018 9:00 AM Medical Record Number: [id number] Patient Account Number: [id number] Date of Birth/Sex: Oct 09, 1980 (37 y.o. M) Treating RN: Brian HaggisPinkerton, Debi Primary Care Evelyn Aguinaldo: Brian GreenspanHODGES, BETH Other Clinician: Referring Aleeah Greeno: Brian GreenspanHODGES, BETH Treating Nekesha Font/Extender: STONE III, [name] Weeks in Treatment: 15 Wound Status Wound Number: 4 Primary Calciphylaxis Etiology: Wound Location: Left Abdomen - Lower Quadrant - Lateral Wound Status: Open Wounding Event: Gradually Appeared Comorbid Anemia, Asthma, Hypertension, End Stage History: Renal Disease Date Acquired: 02/04/2018 Weeks Of Treatment: 6 Clustered Wound: No Photos Photo Uploaded By: Brian MullingPinkerton, Debra on 03/19/2018 16:02:18 Wound Measurements Length: (cm) 0.1 Width: (cm) 0.1 Depth: (cm) 0.1 Area: (cm) 0.008 Volume: (cm) 0.001 % Reduction in Area: 98.1% % Reduction in Volume: 97.6% Epithelialization: None Tunneling: No Undermining: No Wound Description Full Thickness Without Exposed Support Classification: Structures Wound Margin: Flat and Intact Exudate None Present Amount: Foul Odor After Cleansing: No Slough/Fibrino No Wound Bed Granulation Amount: None Present (0%) Exposed Structure Necrotic Amount: None Present (0%) Fascia Exposed: No Fat Layer (Subcutaneous Tissue) Exposed: No Tendon Exposed: No Muscle Exposed: No Joint Exposed: No Bone Exposed: No Periwound Skin Texture Niemann, D'Arcy [name]. ([id number]) Texture Color No  Abnormalities Noted: No No Abnormalities Noted: No Moisture Temperature / Pain No Abnormalities Noted: No Temperature: No Abnormality Tenderness on Palpation: Yes Wound Preparation Ulcer Cleansing: Rinsed/Irrigated with Saline Topical Anesthetic Applied: None Treatment Notes Wound #4 (Left, Lateral Abdomen - Lower Quadrant) 3. Peri-wound Care: Skin Prep Electronic Signature(s) Signed: 03/18/2018 4:59:33 PM By: Brian MullingPinkerton, Debra Entered By: Brian MullingPinkerton, Debra on 03/18/2018 09:05:42 Billey, [name]. ([id number]) -------------------------------------------------------------------------------- Wound Assessment Details Patient Name: [name], [name]. Date of Service: 03/18/2018 9:00 AM Medical Record Number: [id number] Patient Account Number: [id number] Date of Birth/Sex: Oct 09, 1980 (37 y.o. M) Treating RN: Brian HaggisPinkerton, Debi Primary Care Ece Cumberland: Brian GreenspanHODGES, BETH Other Clinician: Referring Yamel Bale: Brian GreenspanHODGES, BETH Treating Althia Egolf/Extender: STONE III, [name] Weeks in Treatment: 15 Wound Status Wound Number: 5 Primary Calciphylaxis Etiology: Wound Location: Left Upper Leg - Medial Wound Status: Open Wounding Event: Gradually Appeared Comorbid Anemia, Asthma, Hypertension, End Stage Date Acquired: 02/18/2018 History: Renal Disease Weeks Of Treatment: 4 Clustered Wound: No Photos Photo Uploaded By: Brian MullingPinkerton, Debra on 03/19/2018 16:03:00 Wound Measurements Length: (cm) 0.5 Width: (cm) 1.2 Depth: (cm) 0.1 Area: (cm) 0.471 Volume: (cm) 0.047 % Reduction in Area: -22.3% % Reduction in Volume: -23.7% Epithelialization: None Tunneling: No Undermining: No Wound Description Full Thickness Without Exposed Support Classification: Structures Wound Margin: Flat and Intact Exudate None Present Amount: Foul Odor After Cleansing: No Slough/Fibrino No Wound Bed Granulation Amount: None Present (0%) Exposed Structure Fascia Exposed: No Fat Layer (Subcutaneous Tissue) Exposed: No Tendon  Exposed: No Muscle Exposed: No Joint Exposed: No Bone Exposed: No Periwound Skin Texture Texture Color Borner, Christophor [name]. ([id number]) No Abnormalities Noted: No No Abnormalities Noted: No Moisture Temperature / Pain No Abnormalities Noted: No Temperature: No Abnormality Tenderness on Palpation: Yes Wound Preparation Ulcer Cleansing: Rinsed/Irrigated with Saline Topical Anesthetic Applied: Other: lidocaine 4%, Treatment Notes Wound #5 (Left, Medial Upper Leg) 1. Cleansed with: Clean wound with Normal Saline 2. Anesthetic Topical Lidocaine  4% cream to wound bed prior to debridement 3. Peri-wound Care: Skin Prep 4. Dressing Applied: Hydrafera Blue 5. Secondary Dressing Applied [place] Electronic Signature(s) Signed: 03/18/2018 4:59:33 PM By: Brian MullingPinkerton, Debra Entered By: Brian MullingPinkerton, Debra on 03/18/2018 09:02:46 Droege, [name]. ([id number]) -------------------------------------------------------------------------------- Wound Assessment Details Patient Name: [name], [name]. Date of Service: 03/18/2018 9:00 AM Medical Record Number: [id number] Patient Account Number: [id number] Date of Birth/Sex: 02-05-1981 (37 y.o. M) Treating RN: Renne CriglerFlinchum, Cheryl Primary Care Latressa Harries: Brian GreenspanHODGES, BETH Other Clinician: Referring Mariely Mahr: Brian GreenspanHODGES, BETH Treating Gardiner Espana/Extender: STONE III, [name] Weeks in Treatment: 15 Wound Status Wound Number: 6 Primary Calciphylaxis Etiology: Wound Location: Right, Anterior Upper Leg Wound Status: Healed - Epithelialized Wounding Event: Gradually Appeared Comorbid Anemia, Asthma, Hypertension, End Stage Date Acquired: 02/18/2018 History: Renal Disease Weeks Of Treatment: 4 Clustered Wound: No Photos Photo Uploaded By: Brian MullingPinkerton, Debra on 03/19/2018 15:59:53 Wound Measurements Length: (cm) 0 Width: (cm) 0 Depth: (cm) 0 Area: (cm) 0 Volume: (cm) 0 % Reduction in Area: 100% % Reduction in Volume: 100% Epithelialization: None Tunneling:  No Undermining: No Wound Description Full Thickness Without Exposed Support Classification: Structures Wound Margin: Flat and Intact Exudate None Present Amount: Foul Odor After Cleansing: No Slough/Fibrino No Wound Bed Granulation Amount: None Present (0%) Exposed Structure Necrotic Amount: Large (67-100%) Fascia Exposed: No Necrotic Quality: Eschar Fat Layer (Subcutaneous Tissue) Exposed: No Tendon Exposed: No Muscle Exposed: No Joint Exposed: No Bone Exposed: No Periwound Skin Texture Texture Color [name], [name]. ([id number]) No Abnormalities Noted: No No Abnormalities Noted: No Moisture Temperature / Pain No Abnormalities Noted: No Temperature: No Abnormality Tenderness on Palpation: Yes Wound Preparation Ulcer Cleansing: Rinsed/Irrigated with Saline Topical Anesthetic Applied: Other: lidocaine 4%, Electronic Signature(s) Signed: 03/19/2018 8:47:36 AM By: Renne CriglerFlinchum, Cheryl Entered By: Renne CriglerFlinchum, Cheryl on 03/18/2018 09:32:19 [name], [name]. ([id number]) -------------------------------------------------------------------------------- Vitals Details Patient Name: Sargeant, Krithik [name]. Date of Service: 03/18/2018 9:00 AM Medical Record Number: [id number] Patient Account Number: [id number] Date of Birth/Sex: 02-05-1981 (37 y.o. M) Treating RN: Brian HaggisPinkerton, Debi Primary Care Patrizia Paule: Brian GreenspanHODGES, BETH Other Clinician: Referring Eyvette Cordon: Brian GreenspanHODGES, BETH Treating Eileen Kangas/Extender: STONE III, [name] Weeks in Treatment: 15 Vital Signs Time Taken: 08:59 Temperature (F): 98.3 Height (in): 70 Pulse (bpm): 68 Weight (lbs): 245 Respiratory Rate (breaths/min): 16 Body Mass Index (BMI): 35.2 Blood Pressure (mmHg): 116/57 Reference Range: 80 - 120 mg / dl Electronic Signature(s) Signed: 03/18/2018 4:59:33 PM By: Brian MullingPinkerton, Debra Entered By: Brian MullingPinkerton, Debra on 03/18/2018 09:00:19

## 2018-04-01 ENCOUNTER — Encounter: Payer: Medicaid Other | Admitting: Physician Assistant

## 2018-04-01 DIAGNOSIS — L97122 Non-pressure chronic ulcer of left thigh with fat layer exposed: Secondary | ICD-10-CM | POA: Diagnosis not present

## 2018-04-11 NOTE — Progress Notes (Signed)
ILHAN, MADAN ([id number]) Visit Report for 04/01/2018 Arrival Information Details Patient Name: DOVER, HEAD A. Date of Service: 04/01/2018 8:30 AM Medical Record Number: [id number] Patient Account Number: [id number] Date of Birth/Sex: January 31, 1981 (37 y.o. M) Treating RN: Rema Jasmine Primary Care Damaria Stofko: Abner Greenspan Other Clinician: Referring Terrence Pizana: Abner Greenspan Treating Sumayah Bearse/Extender: Linwood Dibbles, HOYT Weeks in Treatment: 17 Visit Information History Since Last Visit Added or deleted any medications: No Patient Arrived: Ambulatory Any new allergies or adverse reactions: No Arrival Time: 08:42 Had a fall or experienced change in No Accompanied By: self activities of daily living that may affect Transfer Assistance: None risk of falls: Patient Identification Verified: Yes Signs or symptoms of abuse/neglect since last visito No Secondary Verification Process Yes Hospitalized since last visit: No Completed: Implantable device outside of the clinic excluding No Patient Requires Transmission-Based No cellular tissue based products placed in the center Precautions: since last visit: Patient Has Alerts: Yes Pain Present Now: No Patient Alerts: Patient on Blood Thinner Eliquis Electronic Signature(s) Signed: 04/01/2018 4:17:03 PM By: Rema Jasmine Entered By: Rema Jasmine on 04/01/2018 08:49:14 Mcguffee, Julien Nordmann ([id number]) -------------------------------------------------------------------------------- Clinic Level of Care Assessment Details Patient Name: Harter, Riki A. Date of Service: 04/01/2018 8:30 AM Medical Record Number: [id number] Patient Account Number: [id number] Date of Birth/Sex: 04/02/1981 (37 y.o. M) Treating RN: Renne Crigler Primary Care Marilena Trevathan: Abner Greenspan Other Clinician: Referring Zahmir Lalla: Abner Greenspan Treating Payeton Germani/Extender: Linwood Dibbles, HOYT Weeks in Treatment: 17 Clinic Level of Care Assessment Items TOOL 4 Quantity Score X - Use when only an EandM  is performed on FOLLOW-UP visit 1 0 ASSESSMENTS - Nursing Assessment / Reassessment X - Reassessment of Co-morbidities (includes updates in patient status) 1 10 X- 1 5 Reassessment of Adherence to Treatment Plan ASSESSMENTS - Wound and Skin Assessment / Reassessment X - Simple Wound Assessment / Reassessment - one wound 1 5 []  - 0 Complex Wound Assessment / Reassessment - multiple wounds []  - 0 Dermatologic / Skin Assessment (not related to wound area) ASSESSMENTS - Focused Assessment []  - Circumferential Edema Measurements - multi extremities 0 []  - 0 Nutritional Assessment / Counseling / Intervention []  - 0 Lower Extremity Assessment (monofilament, tuning fork, pulses) []  - 0 Peripheral Arterial Disease Assessment (using hand held doppler) ASSESSMENTS - Ostomy and/or Continence Assessment and Care []  - Incontinence Assessment and Management 0 []  - 0 Ostomy Care Assessment and Management (repouching, etc.) PROCESS - Coordination of Care X - Simple Patient / Family Education for ongoing care 1 15 []  - 0 Complex (extensive) Patient / Family Education for ongoing care []  - 0 Staff obtains Chiropractor, Records, Test Results / Process Orders []  - 0 Staff telephones HHA, Nursing Homes / Clarify orders / etc []  - 0 Routine Transfer to another Facility (non-emergent condition) []  - 0 Routine Hospital Admission (non-emergent condition) []  - 0 New Admissions / Manufacturing engineer / Ordering NPWT, Apligraf, etc. []  - 0 Emergency Hospital Admission (emergent condition) X- 1 10 Simple Discharge Coordination Durley, Kean A. ([id number]) []  - 0 Complex (extensive) Discharge Coordination PROCESS - Special Needs []  - Pediatric / Minor Patient Management 0 []  - 0 Isolation Patient Management []  - 0 Hearing / Language / Visual special needs []  - 0 Assessment of Community assistance (transportation, D/C planning, etc.) []  - 0 Additional assistance / Altered mentation []  -  0 Support Surface(s) Assessment (bed, cushion, seat, etc.) INTERVENTIONS - Wound Cleansing / Measurement X - Simple Wound Cleansing - one wound 1 5 []  - 0 Complex  Wound Cleansing - multiple wounds X- 1 5 Wound Imaging (photographs - any number of wounds) []  - 0 Wound Tracing (instead of photographs) X- 1 5 Simple Wound Measurement - one wound []  - 0 Complex Wound Measurement - multiple wounds INTERVENTIONS - Wound Dressings []  - Small Wound Dressing one or multiple wounds 0 []  - 0 Medium Wound Dressing one or multiple wounds []  - 0 Large Wound Dressing one or multiple wounds []  - 0 Application of Medications - topical []  - 0 Application of Medications - injection INTERVENTIONS - Miscellaneous []  - External ear exam 0 []  - 0 Specimen Collection (cultures, biopsies, blood, body fluids, etc.) []  - 0 Specimen(s) / Culture(s) sent or taken to Lab for analysis []  - 0 Patient Transfer (multiple staff / Nurse, adult / Similar devices) []  - 0 Simple Staple / Suture removal (25 or less) []  - 0 Complex Staple / Suture removal (26 or more) []  - 0 Hypo / Hyperglycemic Management (close monitor of Blood Glucose) []  - 0 Ankle / Brachial Index (ABI) - do not check if billed separately X- 1 5 Vital Signs Alpert, Fadil A. ([id number]) Has the patient been seen at the hospital within the last three years: Yes Total Score: 65 Level Of Care: New/Established - Level 2 Electronic Signature(s) Signed: 04/01/2018 5:29:48 PM By: Renne Crigler Entered By: Renne Crigler on 04/01/2018 09:26:04 Hemme, Julien Nordmann ([id number]) -------------------------------------------------------------------------------- Encounter Discharge Information Details Patient Name: Lindquist, Tyrick A. Date of Service: 04/01/2018 8:30 AM Medical Record Number: [id number] Patient Account Number: [id number] Date of Birth/Sex: [birth year] (37 y.o. M) Treating RN: Renne Crigler Primary Care Leeum Sankey: Abner Greenspan Other  Clinician: Referring Brenton Joines: Abner Greenspan Treating Krisi Azua/Extender: Linwood Dibbles, HOYT Weeks in Treatment: 17 Encounter Discharge Information Items Discharge Condition: Stable Ambulatory Status: Ambulatory Discharge Destination: Skilled Nursing Facility Telephoned: No Orders Sent: Yes Transportation: Private Auto Schedule Follow-up Appointment: Yes Clinical Summary of Care: Electronic Signature(s) Signed: 04/01/2018 5:29:48 PM By: Renne Crigler Entered By: Renne Crigler on 04/01/2018 09:27:03 Putnam, Julien Nordmann ([id number]) -------------------------------------------------------------------------------- Lower Extremity Assessment Details Patient Name: Waldridge, Prentiss A. Date of Service: 04/01/2018 8:30 AM Medical Record Number: [id number] Patient Account Number: [id number] Date of Birth/Sex: 04-Aug-1981 (37 y.o. M) Treating RN: Rema Jasmine Primary Care Adaira Centola: Abner Greenspan Other Clinician: Referring Faizon Capozzi: Abner Greenspan Treating Roddie Riegler/Extender: Linwood Dibbles, HOYT Weeks in Treatment: 17 Electronic Signature(s) Signed: 04/01/2018 4:17:03 PM By: Rema Jasmine Entered By: Rema Jasmine on 04/01/2018 08:56:54 Conrow, Julien Nordmann ([id number]) -------------------------------------------------------------------------------- Multi Wound Chart Details Patient Name: Loughridge, Tirth A. Date of Service: 04/01/2018 8:30 AM Medical Record Number: [id number] Patient Account Number: [id number] Date of Birth/Sex: 07/23/1981 (37 y.o. M) Treating RN: Renne Crigler Primary Care Alonia Dibuono: Abner Greenspan Other Clinician: Referring Amalia Edgecombe: Abner Greenspan Treating Ismahan Lippman/Extender: STONE III, HOYT Weeks in Treatment: 17 Vital Signs Height(in): 70 Pulse(bpm): 62 Weight(lbs): 245 Blood Pressure(mmHg): 122/73 Body Mass Index(BMI): 35 Temperature(F): 97.9 Respiratory Rate 16 (breaths/min): Photos: [N/A:N/A] Wound Location: Left Abdomen - Lower Left Upper Leg - Medial N/A Quadrant - Lateral Wounding Event:  Gradually Appeared Gradually Appeared N/A Primary Etiology: Calciphylaxis Calciphylaxis N/A Comorbid History: Anemia, Asthma, Anemia, Asthma, N/A Hypertension, End Stage Hypertension, End Stage Renal Disease Renal Disease Date Acquired: 02/04/2018 02/18/2018 N/A Weeks of Treatment: 8 6 N/A Wound Status: Open Open N/A Measurements L x W x D 0.1x0.1x0.1 0.1x0.1x0.1 N/A (cm) Area (cm) : 0.008 0.008 N/A Volume (cm) : 0.001 0.001 N/A % Reduction in Area: 98.10% 97.90% N/A % Reduction in Volume: 97.60% 97.40% N/A Classification: Full Thickness  Without Full Thickness Without N/A Exposed Support Structures Exposed Support Structures Exudate Amount: None Present None Present N/A Wound Margin: Flat and Intact Flat and Intact N/A Granulation Amount: None Present (0%) None Present (0%) N/A Necrotic Amount: None Present (0%) None Present (0%) N/A Exposed Structures: Fascia: No Fascia: No N/A Fat Layer (Subcutaneous Fat Layer (Subcutaneous Tissue) Exposed: No Tissue) Exposed: No Tendon: No Tendon: No Muscle: No Muscle: No Joint: No Joint: No Bone: No Bone: No Norment, Fines A. ([id number]) Epithelialization: None None N/A Periwound Skin Texture: No Abnormalities Noted No Abnormalities Noted N/A Periwound Skin Moisture: No Abnormalities Noted No Abnormalities Noted N/A Periwound Skin Color: No Abnormalities Noted No Abnormalities Noted N/A Temperature: No Abnormality No Abnormality N/A Tenderness on Palpation: Yes Yes N/A Wound Preparation: Ulcer Cleansing: Ulcer Cleansing: N/A Rinsed/Irrigated with Saline Rinsed/Irrigated with Saline Topical Anesthetic Applied: Topical Anesthetic Applied: None Other: lidocaine 4% Treatment Notes Electronic Signature(s) Signed: 04/01/2018 5:29:48 PM By: Renne CriglerFlinchum, Cheryl Entered By: Renne CriglerFlinchum, Cheryl on 04/01/2018 09:19:47 Foister, Julien NordmannJOSEPH A. ([id number]) -------------------------------------------------------------------------------- Multi-Disciplinary  Care Plan Details Patient Name: Harewood, Juanluis A. Date of Service: 04/01/2018 8:30 AM Medical Record Number: [id number] Patient Account Number: [id number] Date of Birth/Sex: 04-22-1981 (37 y.o. M) Treating RN: Renne CriglerFlinchum, Cheryl Primary Care Xylah Early: Abner GreenspanHODGES, BETH Other Clinician: Referring Lauris Keepers: Abner GreenspanHODGES, BETH Treating Flordia Kassem/Extender: Linwood DibblesSTONE III, HOYT Weeks in Treatment: 17 Active Inactive Electronic Signature(s) Signed: 04/01/2018 5:29:48 PM By: Renne CriglerFlinchum, Cheryl Entered By: Renne CriglerFlinchum, Cheryl on 04/01/2018 11:08:47 Geng, Julien NordmannJOSEPH A. ([id number]) -------------------------------------------------------------------------------- Pain Assessment Details Patient Name: Vaeth, Melquiades A. Date of Service: 04/01/2018 8:30 AM Medical Record Number: [id number] Patient Account Number: [id number] Date of Birth/Sex: 04-22-1981 (37 y.o. M) Treating RN: Rema JasmineNg, Wendi Primary Care Adia Crammer: Abner GreenspanHODGES, BETH Other Clinician: Referring Quashaun Lazalde: Abner GreenspanHODGES, BETH Treating Mariyah Upshaw/Extender: Linwood DibblesSTONE III, HOYT Weeks in Treatment: 17 Active Problems Location of Pain Severity and Description of Pain Patient Has Paino No Site Locations Pain Management and Medication Current Pain Management: Goals for Pain Management pt denies pain at this time. Electronic Signature(s) Signed: 04/01/2018 4:17:03 PM By: Rema JasmineNg, Wendi Entered By: Rema JasmineNg, Wendi on 04/01/2018 08:49:41 Castronova, Julien NordmannJOSEPH A. ([id number]) -------------------------------------------------------------------------------- Patient/Caregiver Education Details Patient Name: Crean, Winford A. Date of Service: 04/01/2018 8:30 AM Medical Record Number: [id number] Patient Account Number: [id number] Date of Birth/Gender: 04-22-1981 (37 y.o. M) Treating RN: Renne CriglerFlinchum, Cheryl Primary Care Physician: Abner GreenspanHODGES, BETH Other Clinician: Referring Physician: Abner GreenspanHODGES, BETH Treating Physician/Extender: Skeet SimmerSTONE III, HOYT Weeks in Treatment: 17 Education Assessment Education Provided To: Patient Education  Topics Provided Wound/Skin Impairment: Handouts: Skin Care Do's and Dont's Methods: Explain/Verbal Responses: State content correctly Electronic Signature(s) Signed: 04/01/2018 5:29:48 PM By: Renne CriglerFlinchum, Cheryl Entered By: Renne CriglerFlinchum, Cheryl on 04/01/2018 09:27:27 Rambert, Julien NordmannJOSEPH A. ([id number]) -------------------------------------------------------------------------------- Wound Assessment Details Patient Name: Say, Nikai A. Date of Service: 04/01/2018 8:30 AM Medical Record Number: [id number] Patient Account Number: [id number] Date of Birth/Sex: 04-22-1981 (37 y.o. M) Treating RN: Renne CriglerFlinchum, Cheryl Primary Care Azriel Dancy: Abner GreenspanHODGES, BETH Other Clinician: Referring Quoc Tome: Abner GreenspanHODGES, BETH Treating Lindsey Hommel/Extender: STONE III, HOYT Weeks in Treatment: 17 Wound Status Wound Number: 4 Primary Calciphylaxis Etiology: Wound Location: Left, Lateral Abdomen - Lower Quadrant Wound Status: Healed - Epithelialized Wounding Event: Gradually Appeared Comorbid Anemia, Asthma, Hypertension, End Stage Date Acquired: 02/04/2018 History: Renal Disease Weeks Of Treatment: 8 Clustered Wound: No Photos Photo Uploaded By: Rema JasmineNg, Wendi on 04/01/2018 08:59:59 Wound Measurements Length: (cm) 0 % R Width: (cm) 0 % R Depth: (cm) 0 Epi Area: (cm) 0 Tu Volume: (cm) 0 Un eduction in Area: 100% eduction in Volume: 100% thelialization: None nneling:  No dermining: No Wound Description Full Thickness Without Exposed Support Classification: Structures Wound Margin: Flat and Intact Exudate None Present Amount: Foul Odor After Cleansing: No Slough/Fibrino No Wound Bed Granulation Amount: None Present (0%) Exposed Structure Necrotic Amount: None Present (0%) Fascia Exposed: No Fat Layer (Subcutaneous Tissue) Exposed: No Tendon Exposed: No Muscle Exposed: No Joint Exposed: No Bone Exposed: No Periwound Skin Texture Texture Color Manke, Larone A. ([id number]) No Abnormalities Noted: Yes No Abnormalities  Noted: Yes Moisture Temperature / Pain No Abnormalities Noted: No Temperature: No Abnormality Tenderness on Palpation: Yes Wound Preparation Ulcer Cleansing: Rinsed/Irrigated with Saline Topical Anesthetic Applied: None Treatment Notes Wound #4 (Left, Lateral Abdomen - Lower Quadrant) 1. Cleansed with: Clean wound with Normal Saline 3. Peri-wound Care: Skin Prep Electronic Signature(s) Signed: 04/01/2018 5:29:48 PM By: Renne Crigler Entered By: Renne Crigler on 04/01/2018 11:08:20 Cripps, Julien Nordmann ([id number]) -------------------------------------------------------------------------------- Wound Assessment Details Patient Name: Sheeler, Naseer A. Date of Service: 04/01/2018 8:30 AM Medical Record Number: [id number] Patient Account Number: [id number] Date of Birth/Sex: [birth year] (37 y.o. M) Treating RN: Renne Crigler Primary Care Domenique Southers: Abner Greenspan Other Clinician: Referring Coen Miyasato: Abner Greenspan Treating Radonna Bracher/Extender: STONE III, HOYT Weeks in Treatment: 17 Wound Status Wound Number: 5 Primary Calciphylaxis Etiology: Wound Location: Left, Medial Upper Leg Wound Status: Healed - Epithelialized Wounding Event: Gradually Appeared Comorbid Anemia, Asthma, Hypertension, End Stage Date Acquired: 02/18/2018 History: Renal Disease Weeks Of Treatment: 6 Clustered Wound: No Photos Photo Uploaded By: Rema Jasmine on 04/01/2018 09:00:42 Wound Measurements Length: (cm) 0 % Reduc Width: (cm) 0 % Reduc Depth: (cm) 0 Epithel Area: (cm) 0 Tunnel Volume: (cm) 0 Underm tion in Area: 100% tion in Volume: 100% ialization: None ing: No ining: No Wound Description Full Thickness Without Exposed Support Classification: Structures Wound Margin: Flat and Intact Exudate None Present Amount: Foul Odor After Cleansing: No Slough/Fibrino No Wound Bed Granulation Amount: None Present (0%) Exposed Structure Necrotic Amount: None Present (0%) Fascia Exposed: No Fat Layer  (Subcutaneous Tissue) Exposed: No Tendon Exposed: No Muscle Exposed: No Joint Exposed: No Bone Exposed: No Periwound Skin Texture Texture Color Dunaj, Konrad A. ([id number]) No Abnormalities Noted: Yes No Abnormalities Noted: Yes Moisture Temperature / Pain No Abnormalities Noted: No Temperature: No Abnormality Tenderness on Palpation: Yes Wound Preparation Ulcer Cleansing: Rinsed/Irrigated with Saline Topical Anesthetic Applied: Other: lidocaine 4%, Treatment Notes Wound #5 (Left, Medial Upper Leg) 1. Cleansed with: Clean wound with Normal Saline 3. Peri-wound Care: Skin Prep Electronic Signature(s) Signed: 04/01/2018 5:29:48 PM By: Renne Crigler Entered By: Renne Crigler on 04/01/2018 11:08:22 Lamadrid, Julien Nordmann ([id number]) -------------------------------------------------------------------------------- Vitals Details Patient Name: Palmisano, Jaicob A. Date of Service: 04/01/2018 8:30 AM Medical Record Number: [id number] Patient Account Number: [id number] Date of Birth/Sex: 1981-01-24 (37 y.o. M) Treating RN: Rema Jasmine Primary Care Emily Massar: Abner Greenspan Other Clinician: Referring Catherin Doorn: Abner Greenspan Treating Laban Orourke/Extender: STONE III, HOYT Weeks in Treatment: 17 Vital Signs Time Taken: 08:40 Temperature (F): 97.9 Height (in): 70 Pulse (bpm): 62 Weight (lbs): 245 Respiratory Rate (breaths/min): 16 Body Mass Index (BMI): 35.2 Blood Pressure (mmHg): 122/73 Reference Range: 80 - 120 mg / dl Electronic Signature(s) Signed: 04/01/2018 4:17:03 PM By: Rema Jasmine Entered By: Rema Jasmine on 04/01/2018 08:50:10

## 2018-04-11 NOTE — Progress Notes (Signed)
CLENT, DAMORE ([id number]) Visit Report for 04/01/2018 Chief Complaint Document Details Patient Name: [name], Brian A. Date of Service: 04/01/2018 8:30 AM Medical Record Number: [id number] Patient Account Number: [id number] Date of Birth/Sex: 05/04/1981 (37 y.o. M) Treating RN: Roger Shelter Primary Care Provider: Marco Collie Other Clinician: Referring Provider: Marco Collie Treating Provider/Extender: Melburn Hake, Laportia Carley Weeks in Treatment: 17 Information Obtained from: Patient Chief Complaint Bilateral thigh Calciphylaxis Ulcers Electronic Signature(s) Signed: 04/02/2018 9:02:15 AM By: Worthy Keeler PA-C Entered By: Worthy Keeler on 04/01/2018 08:22:59 Innis, Governor Rooks ([id number]) -------------------------------------------------------------------------------- HPI Details Patient Name: Fajardo, Joncarlos A. Date of Service: 04/01/2018 8:30 AM Medical Record Number: [id number] Patient Account Number: [id number] Date of Birth/Sex: 1981/04/15 (37 y.o. 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 He has currently been undergoing treatment according to notes reviewed from 11/28/17 for this. 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 Subsequently patient was using Santyl for very long time but is no longer using that at this point. He is receiving sodium thiosulfate during hemodialysis along with Sevelamer to help control his phosphorus levels. Currently a steroid cream was recommended for the wounds although he has not used that yet. He a has what appears to be extremely hyper granular and  poorly attached granulation noted over the bed of the wound. This is true of both wounds of lower extremities. He does show some signs of epithelialization around the border although this has been very slow according to the patient. Fortunately he is not having significant pain at this point. He does have a history of hypertension as well is congestive heart failure. Otherwise he states the wounds are much better than when I first started. 12/10/17 on evaluation today patient actually presents with signs of improvement in regard to his bilateral thigh ulcer. He has been tolerating the dressing changes without complication which is good news. With that being said is not really having a significant amount of bleeding at this point today is excellent. He has been pleased with the progress as well. There's no evidence of infection. 12/19/17; patient normally followed by Jeri Cos on Mondays. In his absence he comes in to see me today. 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 Initially painful necrotic wounds that have been debrided to a healthy surface. He is using Hydrofera Blue. He has one on the right anterior thigh and one on the left 12/24/17 on evaluation today patient's wounds actually appear to be doing excellent in regard to the bilateral upper thighs. He has been tolerating the dressing changes without complication and overall he seems to be making great progress especially on the left. He has no pain. 12/31/17 on evaluation today patient appears to be doing excellent in regard to his bilateral lower extremity ulcers. The left in fact appears to be completely healed which is great news the right is smaller and doing very well. Overall I'm pleased with the progress he's made the patient is very happy. 01/14/18 on evaluation today patient's ulcer on the right thigh actually appears to be doing much better. It's getting smaller. Unfortunately the left thigh ulcer  reopen due to the dressing having gotten stuck to  the wound bed. Subsequently this pulled off the top layer of skin and he does have an open wound at the site yet again. With that being said there does not appear to be evidence of infection overall things have been going well. 02/04/18 on evaluation today patient's ulcers on his thighs actually appear to be doing fairly well at this point. There does not appear to be any evidence of infection and actually appear to be close today. Unfortunately he has a new wound on the left lateral abdominal region he also has an area of what appears to be superficial bruising noted on the right lateral abdominal region. With that being said I cannot tell for sure that this is actually a Calciphylaxis issue or not at this point it could be but it also may just be the normal bruising/irritation due to his close rubbing. He states this is always been an area that does rub significantly. That's part of the reason why he's looking at the surgery that he's actually scheduled for reevaluation four on 02/12/18. He thought this was sooner when I met with him last but this is actually when the appointment is. 02/18/18 on evaluation today unfortunately both of the patient's upper thigh ulcers have reopened. He does seem to have some hyper granular tissue underneath and I was hoping that this would actually resolve and do well although it appears that the epithelium just has not been able to take due to the fact that there is so much hyper granular tissue underlying. For that reason again this has reopened that does need to be addressed today. Nonetheless the patient is not having any significant pain at the site which is good news. He is having more pain on the left lateral flank where he has a new area of opening due to what appears to be also Calciphylaxis. SEIYA, SILSBY ([id number]) 02/25/18 on evaluation today patient appears to be doing rather well in regard to his anterior  thigh ulcer's in my pinion. He has been tolerating the dressing changes without complication there does not appear to be any evidence of infection at this time which is good news. The hyper granular tissue is greatly improved although I still think I would recommend the silver nitrate this week as well. Unfortunately the lateral abdominal ulcer actually is giving him more trouble I think this is a newly and evolving area of Calciphylaxis although it seems to be much more superficial and my hope is it will not continue to progress any more deeply. Fortunately he overall seems to be tolerating the Xeroform well I still think that may be the best choice for him. 03/06/18; the patient has bilateral ulcers secondary to calciphylaxis on his medial thighs and recently on the abdomen 03/18/18 on evaluation today patient appears to be doing very well in regard to his wounds. The right leg appears healed the left lateral abdominal region appears healed. The left leg is significantly smaller and though not completely healed appears to be doing excellent. In general I feel like things are progressing nicely. He's not even been placed in any dressings of the right leg due to the fact that this seems to be doing so well currently. He is still utilizing the hatch affair blue for the left leg. 04/01/18 on evaluation today patient actually appears to be completely healed in regard to his ulcers. He has been tolerating the dressing changes without complication. Fortunately there does not appear to be any evidence of infection. Fortunately everything  appears to be also close today. Electronic Signature(s) Signed: 04/02/2018 9:02:15 AM By: Worthy Keeler PA-C Entered By: Worthy Keeler on 04/01/2018 09:24:27 Goldring, Governor Rooks ([id number]) -------------------------------------------------------------------------------- Physical Exam Details Patient Name: Bayron, Colie A. Date of Service: 04/01/2018 8:30 AM Medical Record  Number: [id number] Patient Account Number: [id number] Date of Birth/Sex: 09/20/1980 (37 y.o. M) Treating RN: Roger Shelter Primary Care Provider: Marco Collie Other Clinician: Referring Provider: Marco Collie Treating Provider/Extender: STONE III, Shambria Camerer Weeks in Treatment: 17 Constitutional Obese and well-hydrated in no acute distress. Respiratory normal breathing without difficulty. Psychiatric this patient is able to make decisions and demonstrates good insight into disease process. Alert and Oriented x 3. pleasant and cooperative. Notes On inspection patient has no skin openings as far as disruption or sores at this point. The lateral flank on the left still shows discoloration but this has really not open into an ulceration at this point either. We have been keeping an eye on that region. Electronic Signature(s) Signed: 04/02/2018 9:02:15 AM By: Worthy Keeler PA-C Entered By: Worthy Keeler on 04/01/2018 09:25:03 Licklider, Governor Rooks ([id number]) -------------------------------------------------------------------------------- Physician Orders Details Patient Name: Brinson, Matan A. Date of Service: 04/01/2018 8:30 AM Medical Record Number: [id number] Patient Account Number: [id number] Date of Birth/Sex: 10/13/1980 (37 y.o. 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 L97.112 Non-pressure chronic ulcer of right thigh with fat layer exposed I10 Essential (primary) hypertension I50.33 Acute on chronic diastolic (congestive) heart failure Discharge From [facility] Services Wound #4 [place] o Discharge from [place] -  treatment completed- use skin prep daily for two weeks for protection of skin Wound #5 Left,Medial Upper Leg o Discharge from [place] - treatment completed- use skin prep daily for two weeks for protection of skin o Discharge from [place] - treatment completed Electronic Signature(s) Signed: 04/01/2018 5:29:48 PM By: Roger Shelter Signed: 04/02/2018 9:02:15 AM By: Worthy Keeler PA-C Entered By: Roger Shelter on 04/01/2018 09:22:32 Fife, Governor Rooks ([id number]) -------------------------------------------------------------------------------- Problem List Details Patient Name: Opiela, Jayln A. Date of Service: 04/01/2018 8:30 AM Medical Record Number: [id number] Patient Account Number: [id number] Date of Birth/Sex: 1981/02/20 (37 y.o. 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 Date of Service: 04/01/2018 8:30 AM Medical Record Number: [id number] Patient Account Number: [id number] Date of Birth/Sex: Jun 09, 1981 (37 y.o. M) Treating RN: Roger Shelter Primary Care Provider: Marco Collie Other Clinician: Referring Provider: Marco Collie Treating Provider/Extender: Melburn Hake, Tonni Mansour Weeks in Treatment: 17 Subjective Chief Complaint Information obtained from Patient  Bilateral thigh Calciphylaxis Ulcers History of Present Illness (HPI) 12/03/17 on evaluation today patient presents for initial evaluation and the wound center concerning issues he has been having with his bilateral lower extremities with Calciphylaxis. He has currently been undergoing treatment according to notes reviewed from 11/28/17 for this. 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 Subsequently patient was using Santyl for very long time but is no longer using that at this point. He is receiving sodium thiosulfate during hemodialysis along with Sevelamer to help control his phosphorus levels. Currently a steroid cream was recommended for the wounds although he has not used that yet. He a has what appears to be extremely hyper granular and poorly attached granulation noted over the bed of the wound. This is true of both wounds of lower extremities. He does show some signs of epithelialization around the border although this has been very slow according to the patient. Fortunately he is not having significant pain at this point. He does have a history of hypertension as well is congestive heart failure. Otherwise he states the wounds are much better than when I first started. 12/10/17 on evaluation today patient actually presents with signs of improvement in regard to his bilateral thigh ulcer. He has been tolerating the dressing changes without complication which is good news. With that being said is not really having a significant amount of bleeding at this point today is excellent. He has been pleased with the progress as well. There's no evidence of infection. 12/19/17; patient normally followed by Jeri Cos on Mondays. In his  absence he comes in to see me today. 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 Initially painful necrotic wounds that have been debrided to a healthy surface. He is using Hydrofera Blue. He has one on the right anterior thigh and one on the left 12/24/17 on evaluation today patient's wounds actually appear to be doing excellent in regard to the bilateral upper thighs. He has been tolerating the dressing changes without complication and overall he seems to be making great progress especially on the left. He has no pain. 12/31/17 on evaluation today patient appears to be doing excellent in regard to his bilateral lower extremity ulcers. The left in fact appears to be completely healed which is great news the right is smaller and doing very well. Overall I'm pleased with the progress he's made the patient is very happy. 01/14/18 on evaluation today patient's ulcer on the right thigh actually appears to be doing much better. It's getting smaller. Unfortunately the left thigh ulcer reopen due to the dressing having gotten stuck to the wound bed. Subsequently this pulled off the top layer of skin and he does have an open wound at the site yet again. With that being said there does not appear to be evidence of infection overall things have been going well. 02/04/18 on evaluation today patient's ulcers on his thighs actually appear to be doing fairly well at this point. There does not appear to be any evidence of infection and actually appear to be close today. Unfortunately he has a new wound on the left lateral abdominal region he also has an area of what appears to be superficial bruising noted on the right lateral abdominal region. With that being said I cannot tell for sure that this is actually a Calciphylaxis issue or not at this point it could be but it also may just be the normal bruising/irritation due to his  close rubbing. He states this is always been an area  that does rub significantly. That's part of the reason why he's looking at the surgery that he's actually scheduled for reevaluation four on 02/12/18. He thought this was sooner when I met with him last but this is actually when the appointment is. 02/18/18 on evaluation today unfortunately both of the patient's upper thigh ulcers have reopened. He does seem to have some Cuadrado, Kavan A. ([id number]) hyper granular tissue underneath and I was hoping that this would actually resolve and do well although it appears that the epithelium just has not been able to take due to the fact that there is so much hyper granular tissue underlying. For that reason again this has reopened that does need to be addressed today. Nonetheless the patient is not having any significant pain at the site which is good news. He is having more pain on the left lateral flank where he has a new area of opening due to what appears to be also Calciphylaxis. 02/25/18 on evaluation today patient appears to be doing rather well in regard to his anterior thigh ulcer's in my pinion. He has been tolerating the dressing changes without complication there does not appear to be any evidence of infection at this time which is good news. The hyper granular tissue is greatly improved although I still think I would recommend the silver nitrate this week as well. Unfortunately the lateral abdominal ulcer actually is giving him more trouble I think this is a newly and evolving area of Calciphylaxis although it seems to be much more superficial and my hope is it will not continue to progress any more deeply. Fortunately he overall seems to be tolerating the Xeroform well I still think that may be the best choice for him. 03/06/18; the patient has bilateral ulcers secondary to calciphylaxis on his medial thighs and recently on the abdomen 03/18/18 on evaluation today patient appears to be doing very well in regard to his wounds. The right leg appears  healed the left lateral abdominal region appears healed. The left leg is significantly smaller and though not completely healed appears to be doing excellent. In general I feel like things are progressing nicely. He's not even been placed in any dressings of the right leg due to the fact that this seems to be doing so well currently. He is still utilizing the hatch affair blue for the left leg. 04/01/18 on evaluation today patient actually appears to be completely healed in regard to his ulcers. He has been tolerating the dressing changes without complication. Fortunately there does not appear to be any evidence of infection. Fortunately everything appears to be also close today. Patient History Information obtained from Patient. Family History Diabetes - Maternal Grandparents, Heart Disease - Maternal Grandparents, Hypertension - Maternal Grandparents, Stroke - Maternal Grandparents, No family history of Cancer, Hereditary Spherocytosis, Kidney Disease, Lung Disease, Seizures, Thyroid Problems, Tuberculosis. Social History Never smoker, Marital Status - Single, Alcohol Use - Never, Drug Use - No History, Caffeine Use - Daily. Review of Systems (ROS) Constitutional Symptoms (General Health) Denies complaints or symptoms of Fever, Chills. Psychiatric The patient has no complaints or symptoms. Objective Constitutional Obese and well-hydrated in no acute distress. Vitals Time Taken: 8:40 AM, Height: 70 in, Weight: 245 lbs, BMI: 35.2, Temperature: 97.9 F, Pulse: 62 bpm, Respiratory Rate: 16 breaths/min, Blood Pressure: 122/73 mmHg. Younker, Crary ([id number]) Respiratory normal breathing without difficulty. Psychiatric this patient is able to make decisions and demonstrates  good insight into disease process. Alert and Oriented x 3. pleasant and cooperative. General Notes: On inspection patient has no skin openings as far as disruption or sores at this point. The lateral flank on  the left still shows discoloration but this has really not open into an ulceration at this point either. We have been keeping an eye on that region. Integumentary (Hair, Skin) Wound #4 status is Open. Original cause of wound was Gradually Appeared. The wound is located on the Left,Lateral Abdomen - Lower Quadrant. The wound measures 0.1cm length x 0.1cm width x 0.1cm depth; 0.008cm^2 area and 0.001cm^3 volume. There is no tunneling or undermining noted. There is a none present amount of drainage noted. The wound margin is flat and intact. There is no granulation within the wound bed. There is no necrotic tissue within the wound bed. The periwound skin appearance had no abnormalities noted for texture. The periwound skin appearance had no abnormalities noted for color. Periwound temperature was noted as No Abnormality. The periwound has tenderness on palpation. Wound #5 status is Open. Original cause of wound was Gradually Appeared. The wound is located on the Left,Medial Upper Leg. The wound measures 0.1cm length x 0.1cm width x 0.1cm depth; 0.008cm^2 area and 0.001cm^3 volume. There is no tunneling or undermining noted. There is a none present amount of drainage noted. The wound margin is flat and intact. There is no granulation within the wound bed. There is no necrotic tissue within the wound bed. The periwound skin appearance had no abnormalities noted for texture. The periwound skin appearance had no abnormalities noted for color. Periwound temperature was noted as No Abnormality. The periwound has tenderness on palpation. 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 Non-pressure chronic ulcer of right thigh with fat layer exposed Essential (primary) hypertension Acute on chronic diastolic (congestive) heart failure Plan [facility]charge From [facility] Services: Wound #4 Left,Lateral Abdomen - Lower  Quadrant: Discharge from [facility] - treatment completed- use skin prep daily for two weeks for protection of skin Wound #5 Left,Medial Upper Leg: Discharge from [place] - treatment completed- use skin prep daily for two weeks for protection of skin Discharge from [place] - treatment completed [place] ([id number]) At this point as the patient appears to be completely healed I'm gonna discontinue wound care services. If anything changes or worsens he knows he can definitely contact her office for additional recommendations and evaluation. Otherwise we will see him back for reevaluation as needed. I am recommending skin prep to the healed areas for the next two weeks then discontinue. Electronic Signature(s) Signed: 04/02/2018 9:02:15 AM By: Worthy Keeler PA-C Entered By: Worthy Keeler on 04/01/2018 09:26:01 Portee, Governor Rooks ([id number]) -------------------------------------------------------------------------------- ROS/PFSH Details Patient Name: Rowlands, Kortez A. Date of Service: 04/01/2018 8:30 AM Medical Record Number: [id number] Patient Account Number: [id number] Date of Birth/Sex: 11-Aug-1981 (37 y.o. M) Treating RN: Roger Shelter Primary Care Provider: Marco Collie Other Clinician: Referring Provider: Marco Collie Treating Provider/Extender: STONE III, Crist Kruszka Weeks in Treatment: 17 Information Obtained From Patient Wound History Do you currently have one or more open woundso Yes How many open wounds do you currently haveo 2 Approximately how long have you had your woundso 6 months How have you been treating your wound(s) until nowo triamcinalone Has your wound(s) ever healed and then re-openedo No Have you had any lab work done in the past montho No Have you  tested positive for an antibiotic resistant organism (MRSA, VRE)o No Have you tested positive for osteomyelitis (bone infection)o No Have you had any tests for circulation on your legso  No Constitutional Symptoms (General Health) Complaints and Symptoms: Negative for: Fever; Chills Hematologic/Lymphatic Medical History: Positive for: Anemia Negative for: Hemophilia; Human Immunodeficiency Virus; Lymphedema; Sickle Cell Disease Respiratory Medical History: Positive for: Asthma - history Negative for: Aspiration; Chronic Obstructive Pulmonary Disease (COPD); Pneumothorax; Sleep Apnea; Tuberculosis Cardiovascular Medical History: Positive for: Hypertension Gastrointestinal Medical History: Negative for: Cirrhosis ; Colitis; Crohnos; Hepatitis A; Hepatitis B; Hepatitis C Genitourinary Medical History: Positive for: End Stage Renal Disease Immunological Medical History: Negative for: Lupus Erythematosus; Raynaudos; Scleroderma Polzin, Arcangel A. ([id number]) Musculoskeletal Medical History: Negative for: Gout; Rheumatoid Arthritis; Osteoarthritis; Osteomyelitis Neurologic Medical History: Negative for: Dementia; Neuropathy Oncologic Medical History: Negative for: Received Chemotherapy; Received Radiation Psychiatric Complaints and Symptoms: No Complaints or Symptoms Immunizations Pneumococcal Vaccine: Received Pneumococcal Vaccination: Yes Implantable Devices Family and Social History Cancer: No; Diabetes: Yes - Maternal Grandparents; Heart Disease: Yes - Maternal Grandparents; Hereditary Spherocytosis: No; Hypertension: Yes - Maternal Grandparents; Kidney Disease: No; Lung Disease: No; Seizures: No; Stroke: Yes - Maternal Grandparents; Thyroid Problems: No; Tuberculosis: No; Never smoker; Marital Status - Single; Alcohol Use: Never; Drug Use: No History; Caffeine Use: Daily; Advanced Directives: No; Patient does not want information on Advanced Directives Physician Affirmation I have reviewed and agree with the above information. Electronic Signature(s) Signed: 04/01/2018 5:29:48 PM By: Roger Shelter Signed: 04/02/2018 9:02:15 AM By: Worthy Keeler  PA-C Entered By: Worthy Keeler on 04/01/2018 09:24:41 Fennema, Governor Rooks ([id number]) -------------------------------------------------------------------------------- SuperBill Details Patient Name: Cibrian, Orian A. Date of Service: 04/01/2018 Medical Record Number: [id number] Patient Account Number: [id number] Date of Birth/Sex: [birth year] (37 y.o. 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 L97.112 Non-pressure chronic ulcer of right thigh with fat layer exposed I10 Essential (primary) hypertension I50.33 Acute on chronic diastolic (congestive) heart failure Facility Procedures CPT4 Code: 89381017 Description: 51025 - WOUND CARE VISIT-LEV 2 EST PT Modifier: Quantity: 1 Physician Procedures CPT4 Code: 8527782 Description: 42353 - WC PHYS LEVEL 2 - EST PT ICD-10 Diagnosis Description 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 L97.122 Non-pressure chronic ulcer of left thigh with fat layer ex L97.112 Non-pressure chronic ulcer of right thigh with fat layer e  I10 Essential (primary) hypertension Modifier: posed xposed Quantity: 1 Electronic Signature(s) Signed: 04/02/2018 9:02:15 AM By: Worthy Keeler PA-C Entered By: Worthy Keeler on 04/01/2018 09:26:26

## 2018-04-23 ENCOUNTER — Encounter: Payer: Self-pay | Admitting: *Deleted

## 2018-04-23 ENCOUNTER — Emergency Department
Admission: EM | Admit: 2018-04-23 | Discharge: 2018-04-23 | Disposition: A | Discharge status: Home / Self Care | Payer: Medicaid Other | Attending: Emergency Medicine | Admitting: Emergency Medicine

## 2018-04-23 ENCOUNTER — Other Ambulatory Visit: Payer: Self-pay

## 2018-04-23 DIAGNOSIS — I132 Hypertensive heart and chronic kidney disease with heart failure and with stage 5 chronic kidney disease, or end stage renal disease: Secondary | ICD-10-CM | POA: Insufficient documentation

## 2018-04-23 DIAGNOSIS — Z7722 Contact with and (suspected) exposure to environmental tobacco smoke (acute) (chronic): Secondary | ICD-10-CM | POA: Diagnosis not present

## 2018-04-23 DIAGNOSIS — Z79899 Other long term (current) drug therapy: Secondary | ICD-10-CM | POA: Diagnosis not present

## 2018-04-23 DIAGNOSIS — I5032 Chronic diastolic (congestive) heart failure: Secondary | ICD-10-CM | POA: Insufficient documentation

## 2018-04-23 DIAGNOSIS — E039 Hypothyroidism, unspecified: Secondary | ICD-10-CM | POA: Insufficient documentation

## 2018-04-23 DIAGNOSIS — N186 End stage renal disease: Secondary | ICD-10-CM | POA: Diagnosis not present

## 2018-04-23 DIAGNOSIS — Z992 Dependence on renal dialysis: Secondary | ICD-10-CM | POA: Insufficient documentation

## 2018-04-23 LAB — PARATHYROID HORMONE, INTACT (NO CA): PTH: 100 pg/mL — ABNORMAL HIGH (ref 15–65)

## 2018-04-23 NOTE — Discharge Instructions (Signed)
Fortunately today your EKG is normal and you are not in pulmonary edema.  I spoke with Dr. Cherylann Ratel who felt it was ok for you to get dialysis at your dialysis center instead of in the hospital.

## 2018-04-23 NOTE — ED Triage Notes (Signed)
Pt did not go to Dialysis on Sat or Thursday related to depression/not feeling well

## 2018-04-23 NOTE — ED Triage Notes (Signed)
Per EMS call ed to transport to the hospital for fluid overload. Pt states the kidney center called to check  His weight and he had a 10kg  weight gain so the kidney center said to come to the hospital. Pt is a resident of [facility]

## 2018-04-23 NOTE — ED Provider Notes (Signed)
[facility] Emergency Department Provider Note  ____________________________________________   First MD Initiated Contact with Patient 04/23/18 0602     (approximate)  I have reviewed the triage vital signs and the nursing notes.   HISTORY  Chief Complaint No chief complaint on file.    HPI Tonia Clowe is a 37 y.o. male who is brought to the emergency department via EMS for fluid overload and dialysis.  The patient is end-stage renal and has missed his last 2 dialysis sessions because "I did not feel good".  He has a graft in his left upper extremity.  He has dialysis scheduled in 40 minutes.  He wanted to go to the dialysis center but apparently the center advised him to come to the emergency department because his dry weight was up 10 kg.  He is able to lie completely flat.  He has no shortness of breath.  No chest pain.  No abdominal pain nausea or vomiting.  He feels well and says "Can I please go to dialysis?"    Past Medical History:  Diagnosis Date  . Anemia   . CKD (chronic kidney disease)   . 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 Depression   . Diastolic CHF (HCC)   . Generalized anxiety disorder   . Hyperlipidemia   . Hypertension   . Hypertension   . Hypothyroidism   . Lymph edema   . Lymphedema   . Muscle weakness (generalized)   . Nephrotic syndrome   . Obesity   . Pleural effusion   . Pulmonary hypertension (HCC)   . Respiratory failure with hypoxia (HCC)   . Sleep apnea    CPAP  . Vitamin C deficiency     Patient Active Problem List   Diagnosis Date Noted  . Malfunction of arteriovenous dialysis fistula (HCC) 02/23/2018  . PAD (peripheral artery disease) (HCC) 10/22/2017  . ESRD on dialysis (HCC) 08/22/2017  . Essential hypertension 08/22/2017  . Protein-calorie malnutrition, severe 04/30/2017  . 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 Acute on chronic respiratory failure with hypoxia and hypercapnia (HCC)     . Pleural effusion, bilateral   . Postprocedural pneumothorax   . ARF (acute renal failure) (HCC) 03/30/2017    Past Surgical History:  Procedure Laterality Date  . A/V FISTULAGRAM Left 02/27/2018   Procedure: A/V FISTULAGRAM;  Surgeon: Annice Needy, MD;  Location: [facility] INVASIVE CV LAB;  Service: Cardiovascular;  Laterality: Left;  . AV FISTULA PLACEMENT Left 10/05/2017   Procedure: INSERTION OF ARTERIOVENOUS (AV) GORE-TEX GRAFT ARM ( BRACHIAL AXILLARY );  Surgeon: Renford Dills, MD;  Location: [facility] ORS;  Service: Vascular;  Laterality: Left;  . CARDIAC CATHETERIZATION    . DIALYSIS/PERMA CATHETER INSERTION N/A 04/02/2017   Procedure: DIALYSIS/PERMA CATHETER INSERTION;  Surgeon: Annice Needy, MD;  Location: [facility] INVASIVE CV LAB;  Service: Cardiovascular;  Laterality: N/A;  . DIALYSIS/PERMA CATHETER INSERTION N/A 04/25/2017   Procedure: DIALYSIS/PERMA CATHETER INSERTION;  Surgeon: Annice Needy, MD;  Location: [facility] INV[facility]IVE CV [facility];  Service: Cardiovascular;  Laterality: N/A;  . DIALYSIS/PERMA CATHETER REMOVAL N/A 11/19/2017   Procedure: DIALYSIS/PERMA CATHETER REMOVAL;  Surgeon: Annice Needy, MD;  Location: [facility] INVAS[facility] CV [facility];  Service: Cardiovascular;  Laterality: N/A;  . UPPER GI ENDOSCOPY      Prior to Admission medications   Medication Sig Start Date End Date Taking? Authorizing Provider  apixaban (ELIQUIS) 5 MG TABS tablet Take 1 tablet (5  mg total) by mouth 2 (two) times daily. Patient not taking: Reported on 02/27/2018 05/02/17   Alford Highland, MD  buPROPion [facility] SR) 150 MG 12 hr tablet Take 150 mg by mouth 2 (two) times daily.    [provider]  docusate sodium (COLACE) 100 MG capsule Take 1 capsule (100 mg total) by mouth 2 (two) times daily. 04/13/17   Shaune Pollack, MD  ferrous sulfate 325 (65 FE) MG tablet Take 1 tablet (325 mg total) by mouth daily at 12 noon. Patient taking differently: Take 325 mg by mouth daily.  04/14/17   Shaune Pollack, MD  gabapentin  (NEURONTIN) 100 MG capsule Take 100-300 mg by mouth See admin instructions. Take 100 mg by mouth in the evening on Tuesday, Thursday and Saturday. Take 300 mg by mouth in the evening on all other days    [provider]  HYDROcodone-acetaminophen (NORCO) 5-325 MG tablet Take 1-2 tablets by mouth every 6 (six) hours as needed for moderate pain or severe pain. 10/05/17   Schnier, Latina Craver, MD  levothyroxine (SYNTHROID, LEVOTHROID) 75 MCG tablet Take 75 mcg by mouth daily before breakfast.    [provider]  losartan (COZAAR) 100 MG tablet Take 100 mg by mouth at bedtime.     [provider]  metoprolol tartrate (LOPRESSOR) 25 MG tablet Take 25 mg by mouth 2 (two) times daily.    [provider]  multivitamin (RENA-VIT) TABS tablet Take 1 tablet by mouth at bedtime. 04/13/17   Shaune Pollack, MD  multivitamin-lutein [facility]) CAPS capsule Take 1 capsule by mouth daily.    [provider]  ondansetron (ZOFRAN) 4 MG/2ML SOLN injection Inject 4 mg into the muscle every 6 (six) hours as needed for nausea or vomiting.    [provider]  sertraline (ZOLOFT) 25 MG tablet Take 25 mg by mouth daily.     [provider]  sevelamer carbonate (RENVELA) 800 MG tablet Take 800 mg by mouth 3 (three) times daily with meals.     [provider]  torsemide (DEMADEX) 20 MG tablet Take 2 tablets (40 mg total) by mouth daily. Patient taking differently: Take 40 mg by mouth 4 (four) times a week. Monday, Wednesday, Friday and Sunday 05/02/17   Alford Highland, MD    Allergies Morphine and related  Family History  Problem Relation Age of Onset  . Hypertension Mother     Social History Social History   Tobacco Use  . Smoking status: Passive Smoke Exposure - Never Smoker  . Smokeless tobacco: Never Used  Substance Use Topics  . Alcohol use: No  . Drug use: No    Review of Systems Constitutional: No fever/chills ENT: No sore  throat. Cardiovascular: Denies chest pain. Respiratory: Denies shortness of breath. Gastrointestinal: No abdominal pain.  No nausea, no vomiting.  No diarrhea.  No constipation. Musculoskeletal: Negative for back pain. Neurological: Negative for headaches   ____________________________________________   PHYSICAL EXAM:  VITAL SIGNS: ED Triage Vitals  Enc Vitals Group     BP      Pulse      Resp      Temp      Temp src      SpO2      Weight      Height      Head Circumference      Peak Flow      Pain Score      Pain Loc      Pain Edu?  Excl. in GC?     Constitutional: Alert and oriented x4 pleasant cooperative speaks in full clear sentences Head: Atraumatic. Nose: No congestion/rhinnorhea. Mouth/Throat: No trismus Neck: No stridor.  Able to lie completely flat Cardiovascular: Regular rate and rhythm Respiratory: Normal respiratory effort.  No retractions.  Clear to auscultation bilaterally no crackles whatsoever Gastrointestinal: Morbidly obese soft nontender Neurologic:  Normal speech and language. No gross focal neurologic deficits are appreciated.  Skin:  Skin is warm, dry and intact. No rash noted.    ____________________________________________  LABS (all labs ordered are listed, but only abnormal results are displayed)  Labs Reviewed - No data to display   __________________________________________  EKG  ED ECG REPORT I, Merrily Brittle, the attending physician, personally viewed and interpreted this ECG.  Date: 04/23/2018 EKG Time:  Rate: 60 Rhythm: normal sinus rhythm QRS Axis: normal Intervals: normal ST/T Wave abnormalities: normal Narrative Interpretation: no evidence of acute ischemia  ____________________________________________  RADIOLOGY   ____________________________________________   DIFFERENTIAL includes but not limited to  Fluid overload, hyperkalemia, missed dialysis, pulmonary edema   PROCEDURES  Procedure(s)  performed: no  Procedures  Critical Care performed: no  ____________________________________________   INITIAL IMPRESSION / ASSESSMENT AND PLAN / ED COURSE  Pertinent labs & imaging results that were available during my care of the patient were reviewed by me and considered in my medical decision making (see chart for details).   As part of my medical decision making, I reviewed the following data within the electronic MEDICAL RECORD NUMBER History obtained from family if available, nursing notes, old chart and ekg, as well as notes from prior ED visits.       ----------------------------------------- 6:13 AM on 04/23/2018 -----------------------------------------  I spoke with the patient's dialysis center who said that as the patient had missed 2 dialysis appointments they spoke with their physicians assistant who spoke with Dr. Luther Redo and felt the patient should be evaluated in the emergency department.  Now that he is here and he can lie completely flat, has no crackles in his lungs, he has no complaints, and has a normal EKG I do believe it safe for him to be dialyzed as an outpatient.  I spoke with on-call nephrologist Dr. Cherylann Ratel who agreed and felt the patient is stable for outpatient management.  The patient would prefer to not be admitted to the hospital which is entirely understandable. ____________________________________________   FINAL CLINICAL IMPRESSION(S) / ED DIAGNOSES  Final diagnoses:  ESRD (end stage renal disease) (HCC)      NEW MEDICATIONS STARTED DURING THIS VISIT:  New Prescriptions   No medications on file     Note:  This document was prepared using Dragon voice recognition software and may include unintentional dictation errors.      Merrily Brittle, MD 04/23/18 [phone]

## 2018-04-29 ENCOUNTER — Other Ambulatory Visit (INDEPENDENT_AMBULATORY_CARE_PROVIDER_SITE_OTHER): Payer: Self-pay | Admitting: Nurse Practitioner

## 2018-04-29 ENCOUNTER — Ambulatory Visit (INDEPENDENT_AMBULATORY_CARE_PROVIDER_SITE_OTHER): Payer: Medicaid Other | Admitting: Vascular Surgery

## 2018-04-29 ENCOUNTER — Ambulatory Visit (INDEPENDENT_AMBULATORY_CARE_PROVIDER_SITE_OTHER): Payer: Medicaid Other

## 2018-04-29 ENCOUNTER — Encounter (INDEPENDENT_AMBULATORY_CARE_PROVIDER_SITE_OTHER): Payer: Self-pay | Admitting: Vascular Surgery

## 2018-04-29 VITALS — BP 136/95 | HR 70 | Resp 15 | Ht 69.0 in | Wt 258.0 lb

## 2018-04-29 DIAGNOSIS — I10 Essential (primary) hypertension: Secondary | ICD-10-CM

## 2018-04-29 DIAGNOSIS — I739 Peripheral vascular disease, unspecified: Secondary | ICD-10-CM | POA: Diagnosis not present

## 2018-04-29 DIAGNOSIS — N186 End stage renal disease: Secondary | ICD-10-CM

## 2018-04-29 DIAGNOSIS — T82590A Other mechanical complication of surgically created arteriovenous fistula, initial encounter: Secondary | ICD-10-CM | POA: Diagnosis not present

## 2018-04-29 DIAGNOSIS — Z992 Dependence on renal dialysis: Secondary | ICD-10-CM

## 2018-04-29 MED ORDER — CEFAZOLIN SODIUM-DEXTROSE 1-4 GM/50ML-% IV SOLN
1.0000 g | Freq: Once | INTRAVENOUS | Status: AC
  Administered 2018-04-30: 1 g via INTRAVENOUS

## 2018-04-30 ENCOUNTER — Ambulatory Visit
Admission: RE | Admit: 2018-04-30 | Discharge: 2018-04-30 | Disposition: A | Discharge status: Home / Self Care | Payer: Medicare Other | Source: Ambulatory Visit | Attending: Vascular Surgery | Admitting: Vascular Surgery

## 2018-04-30 ENCOUNTER — Encounter
Admission: RE | Disposition: A | Discharge status: Home / Self Care | Payer: Self-pay | Source: Ambulatory Visit | Attending: Vascular Surgery

## 2018-04-30 ENCOUNTER — Encounter: Payer: Self-pay | Admitting: *Deleted

## 2018-04-30 DIAGNOSIS — Z9889 Other specified postprocedural states: Secondary | ICD-10-CM | POA: Diagnosis not present

## 2018-04-30 DIAGNOSIS — I272 Pulmonary hypertension, unspecified: Secondary | ICD-10-CM | POA: Insufficient documentation

## 2018-04-30 DIAGNOSIS — I5030 Unspecified diastolic (congestive) heart failure: Secondary | ICD-10-CM | POA: Diagnosis not present

## 2018-04-30 DIAGNOSIS — Z8249 Family history of ischemic heart disease and other diseases of the circulatory system: Secondary | ICD-10-CM | POA: Insufficient documentation

## 2018-04-30 DIAGNOSIS — I132 Hypertensive heart and chronic kidney disease with heart failure and with stage 5 chronic kidney disease, or end stage renal disease: Secondary | ICD-10-CM | POA: Diagnosis not present

## 2018-04-30 DIAGNOSIS — G4730 Sleep apnea, unspecified: Secondary | ICD-10-CM | POA: Insufficient documentation

## 2018-04-30 DIAGNOSIS — Z6838 Body mass index (BMI) 38.0-38.9, adult: Secondary | ICD-10-CM | POA: Diagnosis not present

## 2018-04-30 DIAGNOSIS — E039 Hypothyroidism, unspecified: Secondary | ICD-10-CM | POA: Insufficient documentation

## 2018-04-30 DIAGNOSIS — T82858A Stenosis of vascular prosthetic devices, implants and grafts, initial encounter: Secondary | ICD-10-CM | POA: Insufficient documentation

## 2018-04-30 DIAGNOSIS — E785 Hyperlipidemia, unspecified: Secondary | ICD-10-CM | POA: Diagnosis not present

## 2018-04-30 DIAGNOSIS — E669 Obesity, unspecified: Secondary | ICD-10-CM | POA: Insufficient documentation

## 2018-04-30 DIAGNOSIS — I739 Peripheral vascular disease, unspecified: Secondary | ICD-10-CM | POA: Insufficient documentation

## 2018-04-30 DIAGNOSIS — Y832 Surgical operation with anastomosis, bypass or graft as the cause of abnormal reaction of the patient, or of later complication, without mention of misadventure at the time of the procedure: Secondary | ICD-10-CM | POA: Diagnosis not present

## 2018-04-30 DIAGNOSIS — Z885 Allergy status to narcotic agent status: Secondary | ICD-10-CM | POA: Diagnosis not present

## 2018-04-30 DIAGNOSIS — T82868A Thrombosis of vascular prosthetic devices, implants and grafts, initial encounter: Secondary | ICD-10-CM

## 2018-04-30 DIAGNOSIS — N186 End stage renal disease: Secondary | ICD-10-CM

## 2018-04-30 DIAGNOSIS — Z992 Dependence on renal dialysis: Secondary | ICD-10-CM | POA: Insufficient documentation

## 2018-04-30 DIAGNOSIS — Z7722 Contact with and (suspected) exposure to environmental tobacco smoke (acute) (chronic): Secondary | ICD-10-CM | POA: Insufficient documentation

## 2018-04-30 HISTORY — PX: A/V FISTULAGRAM: CATH118298

## 2018-04-30 LAB — POTASSIUM (ARMC VASCULAR LAB ONLY): POTASSIUM (ARMC VASCULAR LAB): 4.3 (ref 3.5–5.1)

## 2018-04-30 SURGERY — A/V FISTULAGRAM
Anesthesia: Moderate Sedation | Laterality: Left

## 2018-04-30 MED ORDER — HEPARIN (PORCINE) IN NACL 1000-0.9 UT/500ML-% IV SOLN
INTRAVENOUS | Status: AC
  Filled 2018-04-30: qty 1000

## 2018-04-30 MED ORDER — ONDANSETRON HCL 4 MG/2ML IJ SOLN: 4.0000 mg | Freq: Four times a day (QID) | INTRAMUSCULAR | Status: DC | PRN

## 2018-04-30 MED ORDER — FENTANYL CITRATE (PF) 100 MCG/2ML IJ SOLN
INTRAMUSCULAR | Status: DC | PRN
  Administered 2018-04-30: 50 ug via INTRAVENOUS

## 2018-04-30 MED ORDER — IOPAMIDOL (ISOVUE-300) INJECTION 61%
INTRAVENOUS | Status: DC | PRN
  Administered 2018-04-30: 20 mL via INTRAVENOUS

## 2018-04-30 MED ORDER — MIDAZOLAM HCL 5 MG/5ML IJ SOLN
INTRAMUSCULAR | Status: AC
  Filled 2018-04-30: qty 5

## 2018-04-30 MED ORDER — SODIUM CHLORIDE 0.9 % IV SOLN
INTRAVENOUS | Status: DC
  Administered 2018-04-30: 07:00:00 via INTRAVENOUS

## 2018-04-30 MED ORDER — HEPARIN SODIUM (PORCINE) 1000 UNIT/ML IJ SOLN
INTRAMUSCULAR | Status: AC
  Filled 2018-04-30: qty 1

## 2018-04-30 MED ORDER — CEFAZOLIN SODIUM-DEXTROSE 1-4 GM/50ML-% IV SOLN
INTRAVENOUS | Status: AC
  Filled 2018-04-30: qty 50

## 2018-04-30 MED ORDER — MIDAZOLAM HCL 2 MG/2ML IJ SOLN
INTRAMUSCULAR | Status: DC | PRN
  Administered 2018-04-30: 2 mg via INTRAVENOUS

## 2018-04-30 MED ORDER — FENTANYL CITRATE (PF) 100 MCG/2ML IJ SOLN
INTRAMUSCULAR | Status: AC
  Filled 2018-04-30: qty 2

## 2018-04-30 MED ORDER — LIDOCAINE-EPINEPHRINE (PF) 1 %-1:200000 IJ SOLN
INTRAMUSCULAR | Status: AC
  Filled 2018-04-30: qty 30

## 2018-04-30 MED ORDER — FENTANYL CITRATE (PF) 100 MCG/2ML IJ SOLN: 12.5000 ug | Freq: Once | INTRAMUSCULAR | Status: DC | PRN

## 2018-04-30 MED ORDER — HEPARIN SODIUM (PORCINE) 1000 UNIT/ML IJ SOLN
INTRAMUSCULAR | Status: DC | PRN
  Administered 2018-04-30: 3000 [IU] via INTRAVENOUS

## 2018-04-30 SURGICAL SUPPLY — 12 items
BALLN DORADO 9X80X80 (BALLOONS) ×3
BALLOON DORADO 9X80X80 (BALLOONS) ×1 IMPLANT
CANNULA 5F STIFF (CANNULA) ×3 IMPLANT
DEVICE PRESTO INFLATION (MISCELLANEOUS) ×3 IMPLANT
DRAPE BRACHIAL (DRAPES) ×3 IMPLANT
PACK ANGIOGRAPHY (CUSTOM PROCEDURE TRAY) ×3 IMPLANT
SHEATH BRITE TIP 6FRX5.5 (SHEATH) ×3 IMPLANT
SHEATH BRITE TIP 7FRX5.5 (SHEATH) ×3 IMPLANT
STENT VIABAHN 8X150X120 (Permanent Stent) ×2 IMPLANT
STENT VIABAHN 8X15X120 7FR (Permanent Stent) ×1 IMPLANT
SUT MNCRL AB 4-0 PS2 18 (SUTURE) ×3 IMPLANT
WIRE G 018X200 V18 (WIRE) ×3 IMPLANT

## 2018-04-30 NOTE — Op Note (Signed)
[facility]    OPERATIVE NOTE   PROCEDURE: 1.  Left brachial artery to axillary vein arteriovenous graft cannulation under ultrasound guidance 2.  Left arm shuntogram 3.  Covered stent placement across the venous anastomosis and the mid to distal graft pseudoaneurysm with an 8 mm diameter by 15 cm length Viabahn stent  PRE-OPERATIVE DIAGNOSIS: 1. ESRD 2. Malfunctioning left brachial artery to axillary vein arteriovenous graft  POST-OPERATIVE DIAGNOSIS: same as above   SURGEON: Leotis Pain, MD  ANESTHESIA: local with MCS  ESTIMATED BLOOD LOSS: 10 cc  FINDING(S): 1. 70 to 75% stenosis of the venous anastomosis.  Moderate sized pseudoaneurysm in the mid to distal graft.  The remainder of the graft and the central venous circulation appeared to be patent without significant stenosis  SPECIMEN(S):  None  CONTRAST: 20 cc  FLUORO TIME: 1.2 minutes  MODERATE CONSCIOUS SEDATION TIME:  Approximately 20 minutes using 2 mg of Versed and 50 Mcg of Fentanyl  INDICATIONS: [name] is a 37 y.o. male who presents with malfunctioning left brachial artery to axillary vein arteriovenous graft.  The patient is scheduled for left arm shuntogram.  The patient is aware the risks include but are not limited to: bleeding, infection, thrombosis of the cannulated access, and possible anaphylactic reaction to the contrast.  The patient is aware of the risks of the procedure and elects to proceed forward.  DESCRIPTION: After full informed written consent was obtained, the patient was brought back to the angiography suite and placed supine upon the angiography table.  The patient was connected to monitoring equipment. Moderate conscious sedation was administered during a face to face encounter throughout the procedure with my supervision of the RN administering medicines and monitoring the patient's vital signs, pulse oximetry, telemetry and mental status throughout from the start  of the procedure until the patient was taken to the recovery room The left arm was prepped and draped in the standard fashion for a percutaneous access intervention.  Under ultrasound guidance, the left brachial artery to axillary vein arteriovenous graft was cannulated with a micropuncture needle under direct ultrasound guidance and a permanent image was performed.  The microwire was advanced into the graft and the needle was exchanged for the a microsheath.  I then upsized to a 6 Fr Sheath and imaging was performed.  Hand injections were completed to image the access including the central venous system. This demonstrated 70 to 75% stenosis of the venous anastomosis.  Moderate sized pseudoaneurysm in the mid to distal graft.  The remainder of the graft and the central venous circulation appeared to be patent without significant stenosis.  Based on the images, this patient will need a covered stent to treat both areas. I then gave the patient 3000 units of intravenous heparin.  I upsized to a 7 [place] sheath then crossed the stenosis and pseudoaneurysm with a V18 wire.  I selected an 8 mm diameter by 15 cm length covered stent and deployed this encompassing the venous anastomosis and the distal graft.  There is significant waist at the venous anastomosis and at the base of the pseudoaneurysm remaining after stent placement.  Based on the imaging, a 9 mm x 8 cm high pressure angioplasty balloon was selected.  The balloon was centered around the stenosis at the venous anastomosis first and inflated to 18 ATM for 1 minute(s).  It was then pulled back to the mid to distal graft and inflated to 14 atm.  On completion imaging,  a less than 10 % residual stenosis was present and the pseudoaneurysm was excluded.     Based on the completion imaging, no further intervention is necessary.  The wire and balloon were removed from the sheath.  A 4-0 Monocryl purse-string suture was sewn around the sheath.  The sheath was  removed while tying down the suture.  A sterile bandage was applied to the puncture site.  COMPLICATIONS: None  CONDITION: Stable   Leotis Pain  04/30/2018 8:39 AM    This note was created with Dragon Medical transcription system. Any errors in dictation are purely unintentional.

## 2018-04-30 NOTE — H&P (Signed)
[facility] History & Physical Update  The patient was interviewed and re-examined.  [name] previous History and Physical has been reviewed and is unchanged.  There is no change in the plan of care. We plan to proceed with the scheduled procedure.  Festus BarrenJason Padraic Marinos, MD  04/30/2018, 8:03 AM

## 2018-05-01 ENCOUNTER — Encounter (INDEPENDENT_AMBULATORY_CARE_PROVIDER_SITE_OTHER): Payer: Self-pay | Admitting: Vascular Surgery

## 2018-05-01 NOTE — Progress Notes (Signed)
MRN : [id number]  [name] is a 37 y.o. (1980-12-30) male who presents with chief complaint of  Chief Complaint  Patient presents with  . Follow-up    6 month ABI and HDA  .  History of Present Illness: The patient returns to the office for follow up regarding problem with the dialysis access. Currently the patient is maintained via a left arm brachial axillary AV graft.   The patient notes a recent significant increase in bleeding time after decannulation.  The patient has also been informed that there is increased recirculation.    2 dialysis runs ago they had a significant problem with cannulation and he is ended up with a hard knot in the midportion of his graft and this coincides with the onset of the above-noted problems.  The last dialysis session they were unable to cannulate so he is now missed 1 session.  The patient denies hand pain or other symptoms consistent with steal phenomena.  No significant arm swelling.  The patient denies redness or swelling at the access site. The patient denies fever or chills at home or while on dialysis.  The patient denies amaurosis fugax or recent TIA symptoms. There are no recent neurological changes noted. The patient denies claudication symptoms or rest pain symptoms. The patient denies history of DVT, PE or superficial thrombophlebitis. The patient denies recent episodes of angina or shortness of breath.   Duplex ultrasound of the AV access shows a patent access with a pseudoaneurysm in the midportion that is large and surrounded with hematoma.  Hemodynamically significant stenoses or narrowings of the graft are noted both at the location of the pseudoaneurysm likely from compression and at the venous anastomosis.   Current Meds  Medication Sig  . buPROPion (WELLBUTRIN SR) 150 MG 12 hr tablet Take 150 mg by mouth 2 (two) times daily.  . collagenase (SANTYL) ointment Apply topically.  . docusate sodium (COLACE) 100 MG capsule Take  1 capsule (100 mg total) by mouth 2 (two) times daily.  . ferrous sulfate 325 (65 FE) MG tablet Take 1 tablet (325 mg total) by mouth daily at 12 noon. (Patient taking differently: Take 325 mg by mouth daily. )  . gabapentin (NEURONTIN) 100 MG capsule Take 100-300 mg by mouth See admin instructions. Take 100 mg by mouth in the evening on Tuesday, Thursday and Saturday. Take 300 mg by mouth in the evening on all other days  . HYDROcodone-acetaminophen (NORCO) 5-325 MG tablet Take 1-2 tablets by mouth every 6 (six) hours as needed for moderate pain or severe pain.  [name] levothyroxine (SYNTHROID, LEVOTHROID) 75 MCG tablet Take 75 mcg by mouth daily before breakfast.  . losartan (COZAAR) 100 MG tablet Take 100 mg by mouth at bedtime.   . metoprolol tartrate (LOPRESSOR) 25 MG tablet Take 25 mg by mouth 2 (two) times daily.  . multivitamin (RENA-VIT) TABS tablet Take 1 tablet by mouth at bedtime.  . multivitamin-lutein (OCUVITE-LUTEIN) CAPS capsule Take 1 capsule by mouth daily.  [name] nystatin (MYCOSTATIN/NYSTOP) powder Apply topically.  . ondansetron (ZOFRAN) 4 MG/2ML SOLN injection Inject 4 mg into the muscle every 6 (six) hours as needed for nausea or vomiting.  . sertraline (ZOLOFT) 25 MG tablet Take 25 mg by mouth daily.   . sevelamer carbonate (RENVELA) 800 MG tablet Take 800 mg by mouth 3 (three) times daily with meals.   . torsemide (DEMADEX) 20 MG tablet Take 2 tablets (40 mg total) by mouth daily. (Patient taking  differently: Take 40 mg by mouth 4 (four) times a week. Monday, Wednesday, Friday and Sunday)  . triamcinolone ointment (KENALOG) 0.1 % Apply twice a day to affected areas.    Past Medical History:  Diagnosis Date  . Anemia   . CKD (chronic kidney disease)   . 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 Depression   . Diastolic CHF (HCC)   . Generalized anxiety disorder   . Hyperlipidemia   . Hypertension   . Hypertension   . Hypothyroidism   . Lymph edema   . Lymphedema   . Muscle  weakness (generalized)   . Nephrotic syndrome   . Obesity   . Pleural effusion   . Pulmonary hypertension (HCC)   . Respiratory failure with hypoxia (HCC)   . Sleep apnea    CPAP  . Vitamin C deficiency     Past Surgical History:  Procedure Laterality Date  . A/V FISTULAGRAM Left 02/27/2018   Procedure: A/V FISTULAGRAM;  Surgeon: Annice Needy, MD;  Location: [facility];  Service: Cardiovascular;  Laterality: Left;  . A/V FISTULAGRAM Left 04/30/2018   Procedure: A/V FISTULAGRAM;  Surgeon: Annice Needy, MD;  Location: [facility];  Service: Cardiovascular;  Laterality: Left;  . AV FISTULA PLACEMENT Left 10/05/2017   Procedure: INSERTION OF ARTERIOVENOUS (AV) GORE-TEX GRAFT ARM ( BRACHIAL AXILLARY );  Surgeon: Renford Dills, MD;  Location: [facility];  Service: Vascular;  Laterality: Left;  . CARDIAC CATHETERIZATION    . DIALYSIS/PERMA CATHETER INSERTION N/A 04/02/2017   Procedure: DIALYSIS/PERMA CATHETER INSERTION;  Surgeon: Annice Needy, MD;  Location: [facility];  Service: Cardiovascular;  Laterality: N/A;  . DIALYSIS/PERMA CATHETER INSERTION N/A 04/25/2017   Procedure: DIALYSIS/PERMA CATHETER INSERTION;  Surgeon: Annice Needy, MD;  Location: [facility];  Service: Cardiovascular;  Laterality: N/A;  . DIALYSIS/PERMA CATHETER REMOVAL N/A 11/19/2017   Procedure: DIALYSIS/PERMA CATHETER REMOVAL;  Surgeon: Annice Needy, MD;  Location: [facility];  Service: Cardiovascular;  Laterality: N/A;  . UPPER GI ENDOSCOPY      Social History Social History   Tobacco Use  . Smoking status: Passive Smoke Exposure - Never Smoker  . Smokeless tobacco: Never Used  Substance Use Topics  . Alcohol use: No  . Drug use: No    Family History Family History  Problem Relation Age of Onset  . Hypertension Mother     Allergies  Allergen Reactions  . Morphine     Other reaction(s): Other (See Comments) Hallucinations  . Morphine And Related Other (See  Comments)    Hallucinations Hallucinations     REVIEW OF SYSTEMS (Negative unless checked)  Constitutional: [] Weight loss  [] Fever  [] Chills Cardiac: [] Chest pain   [] Chest pressure   [] Palpitations   [] Shortness of breath when laying flat   [] Shortness of breath with exertion. Vascular:  [x] Pain in legs with walking   [] Pain in legs at rest  [] History of DVT   [] Phlebitis   [x] Swelling in legs   [] Varicose veins   [] Non-healing ulcers Pulmonary:   [] Uses home oxygen   [] Productive cough   [] Hemoptysis   [] Wheeze  [] COPD   [] Asthma Neurologic:  [] Dizziness   [] Seizures   [] History of stroke   [] History of TIA  [] Aphasia   [] Vissual changes   [] Weakness or numbness in arm   [] Weakness or numbness in leg Musculoskeletal:   [] Joint swelling   [] Joint pain   [] Low back pain  Hematologic:  [] Easy bruising  [] Easy bleeding   [] Hypercoagulable state   [] Anemic Gastrointestinal:  [] Diarrhea   [] Vomiting  [] Gastroesophageal reflux/heartburn   [] Difficulty swallowing. Genitourinary:  [x] Chronic kidney disease   [] Difficult urination  [] Frequent urination   [] Blood in urine Skin:  [] Rashes   [] Ulcers  Psychological:  [] History of anxiety   []  History of major depression.  Physical Examination  Vitals:   04/29/18 0959  BP: (!) 136/95  Pulse: 70  Resp: 15  Weight: 258 lb (117 kg)  Height: 5\' 9"  (1.753 m)   Body mass index is 38.1 kg/m. Gen: WD/WN, NAD Head: [place]/AT, No temporalis wasting.  Ear/Nose/Throat: Hearing grossly intact, nares w/o erythema or drainage Eyes: PER, EOMI, sclera nonicteric.  Neck: Supple, no large masses.   Pulmonary:  Good air movement, no audible wheezing bilaterally, no use of accessory muscles.  Cardiac: RRR, no JVD Vascular: AV graft strongly pulsatile from the palpable hematoma distally toward the antecubital fossa.  More proximally in the arm there is a very weak thrill.  The Clide Deutscher is a high-pitched staccato through this area. Vessel Right Left  Radial Palpable  Palpable  Ulnar Palpable Palpable  Brachial Palpable Palpable  Gastrointestinal: Non-distended. No guarding/no peritoneal signs.  Musculoskeletal: M/S 5/5 throughout.  No deformity or atrophy.  Neurologic: CN 2-12 intact. Symmetrical.  Speech is fluent. Motor exam as listed above. Psychiatric: Judgment intact, Mood & affect appropriate for pt's clinical situation. Dermatologic: No rashes or ulcers noted.  No changes consistent with cellulitis. Lymph : No lichenification or skin changes of chronic lymphedema.  CBC Lab Results  Component Value Date   WBC 6.5 02/23/2018   HGB 10.9 (L) 02/23/2018   HCT 30.2 (L) 02/23/2018   MCV 86.7 02/23/2018   PLT 250 02/23/2018    BMET    Component Value Date/Time   NA 141 02/23/2018 0826   K 4.6 02/23/2018 0826   CL 96 (L) 02/23/2018 0826   CO2 33 (H) 02/23/2018 0826   GLUCOSE 89 02/23/2018 0826   BUN 68 (H) 02/23/2018 0826   CREATININE 7.65 (H) 02/23/2018 0826   CALCIUM 8.4 (L) 02/23/2018 0826   GFRNONAA 8 (L) 02/23/2018 0826   GFRAA 10 (L) 02/23/2018 0826   CrCl cannot be calculated (Patient's most recent lab result is older than the maximum 21 days allowed.).  COAG Lab Results  Component Value Date   INR 1.09 08/31/2017   INR 1.14 04/11/2017   INR 1.46 04/01/2017    Radiology No results found.   Assessment/Plan 1. Malfunction of arteriovenous dialysis fistula, initial encounter [facility]) Recommend:  The patient is experiencing increasing problems with their dialysis access and he has now missed one run.  He will require intervention urgently.  Patient should have a fistulagram with the intention for intervention.  The intention for intervention is to restore appropriate flow and prevent thrombosis and possible loss of the access.  As well as improve the quality of dialysis therapy.  The risks, benefits and alternative therapies were reviewed in detail with the patient.  All questions were answered.  The patient agrees to proceed  with angio/intervention.     A total of 30 minutes was spent with this patient and greater than 50% was spent in counseling and coordination of care with the patient.  Discussion included the treatment options for vascular disease including indications for surgery and intervention.  Also discussed is the appropriate timing of treatment.  In addition medical therapy was discussed.  2. PAD (peripheral artery  disease) (HCC)  Recommend:  The patient has evidence of atherosclerosis of the lower extremities with claudication.  The patient does not voice lifestyle limiting changes at this point in time.  Noninvasive studies do not suggest clinically significant change.  No invasive studies, angiography or surgery at this time The patient should continue walking and begin a more formal exercise program.  The patient should continue antiplatelet therapy and aggressive treatment of the lipid abnormalities  No changes in the patient's medications at this time  The patient should continue wearing graduated compression socks 10-15 mmHg strength to control the mild edema.    3. Essential hypertension Continue antihypertensive medications as already ordered, these medications have been reviewed and there are no changes at this time.   4. ESRD on dialysis [facility](HCC) Intervention will be planned urgently so that he does not end up missing further dialysis runs.  Once his graft is been restored to proper function we will continue his hemodialysis without interruption.    Levora DredgeGregory Gresham Caetano, MD  05/01/2018 11:39 AM

## 2018-05-04 ENCOUNTER — Emergency Department: Payer: Medicare Other

## 2018-05-04 ENCOUNTER — Encounter: Payer: Self-pay | Admitting: Emergency Medicine

## 2018-05-04 ENCOUNTER — Emergency Department
Admission: EM | Admit: 2018-05-04 | Discharge: 2018-05-04 | Disposition: A | Discharge status: Home / Self Care | Payer: Medicare Other | Attending: Student in an Organized Health Care Education/Training Program | Admitting: Student in an Organized Health Care Education/Training Program

## 2018-05-04 ENCOUNTER — Other Ambulatory Visit: Payer: Self-pay

## 2018-05-04 DIAGNOSIS — W0110XA Fall on same level from slipping, tripping and stumbling with subsequent striking against unspecified object, initial encounter: Secondary | ICD-10-CM | POA: Insufficient documentation

## 2018-05-04 DIAGNOSIS — E039 Hypothyroidism, unspecified: Secondary | ICD-10-CM | POA: Diagnosis not present

## 2018-05-04 DIAGNOSIS — M25561 Pain in right knee: Secondary | ICD-10-CM | POA: Diagnosis present

## 2018-05-04 DIAGNOSIS — M25562 Pain in left knee: Secondary | ICD-10-CM | POA: Insufficient documentation

## 2018-05-04 DIAGNOSIS — Z7722 Contact with and (suspected) exposure to environmental tobacco smoke (acute) (chronic): Secondary | ICD-10-CM | POA: Diagnosis not present

## 2018-05-04 DIAGNOSIS — I5030 Unspecified diastolic (congestive) heart failure: Secondary | ICD-10-CM | POA: Diagnosis not present

## 2018-05-04 DIAGNOSIS — I132 Hypertensive heart and chronic kidney disease with heart failure and with stage 5 chronic kidney disease, or end stage renal disease: Secondary | ICD-10-CM | POA: Insufficient documentation

## 2018-05-04 DIAGNOSIS — N186 End stage renal disease: Secondary | ICD-10-CM | POA: Diagnosis not present

## 2018-05-04 DIAGNOSIS — W19XXXA Unspecified fall, initial encounter: Secondary | ICD-10-CM

## 2018-05-04 DIAGNOSIS — Z79899 Other long term (current) drug therapy: Secondary | ICD-10-CM | POA: Insufficient documentation

## 2018-05-04 LAB — CBC WITH DIFFERENTIAL/PLATELET
BASOS PCT: 1 %
Basophils Absolute: 0.1 10*3/uL (ref 0–0.1)
Eosinophils Absolute: 0.1 10*3/uL (ref 0–0.7)
Eosinophils Relative: 2 %
HEMATOCRIT: 36.7 % — AB (ref 40.0–52.0)
HEMOGLOBIN: 12.8 g/dL — AB (ref 13.0–18.0)
LYMPHS PCT: 23 %
Lymphs Abs: 1.5 10*3/uL (ref 1.0–3.6)
MCH: 32.5 pg (ref 26.0–34.0)
MCHC: 34.8 g/dL (ref 32.0–36.0)
MCV: 93.5 fL (ref 80.0–100.0)
MONOS PCT: 12 %
Monocytes Absolute: 0.7 10*3/uL (ref 0.2–1.0)
NEUTROS ABS: 3.9 10*3/uL (ref 1.4–6.5)
NEUTROS PCT: 62 %
Platelets: 220 10*3/uL (ref 150–440)
RBC: 3.93 MIL/uL — ABNORMAL LOW (ref 4.40–5.90)
RDW: 15.6 % — ABNORMAL HIGH (ref 11.5–14.5)
WBC: 6.3 10*3/uL (ref 3.8–10.6)

## 2018-05-04 LAB — COMPREHENSIVE METABOLIC PANEL
ALBUMIN: 3.5 g/dL (ref 3.5–5.0)
ALK PHOS: 97 U/L (ref 38–126)
ALT: 33 U/L (ref 0–44)
ANION GAP: 16 — AB (ref 5–15)
AST: 23 U/L (ref 15–41)
BILIRUBIN TOTAL: 0.5 mg/dL (ref 0.3–1.2)
BUN: 55 mg/dL — AB (ref 6–20)
CALCIUM: 8.4 mg/dL — AB (ref 8.9–10.3)
CO2: 31 mmol/L (ref 22–32)
Chloride: 92 mmol/L — ABNORMAL LOW (ref 98–111)
Creatinine, Ser: 7.86 mg/dL — ABNORMAL HIGH (ref 0.61–1.24)
GFR calc Af Amer: 9 mL/min — ABNORMAL LOW (ref >60)
GFR, EST NON AFRICAN AMERICAN: 8 mL/min — AB (ref >60)
Glucose, Bld: 86 mg/dL (ref 70–99)
Potassium: 5 mmol/L (ref 3.5–5.1)
Sodium: 139 mmol/L (ref 135–145)
TOTAL PROTEIN: 7.4 g/dL (ref 6.5–8.1)

## 2018-05-04 NOTE — Discharge Instructions (Signed)
Follow-up with your primary care provider if any continued problems.  Also consider following up with the orthopedist about your knee problems.  Clean abrasion on your forehead with mild soap and water and watch for any signs of infection. It is advised that you go to dialysis today.  You should call and let them know your decision.

## 2018-05-04 NOTE — ED Provider Notes (Signed)
[facility] Emergency Department Provider Note   ____________________________________________   First MD Initiated Contact with Patient 05/04/18 [phone]     (approximate)  I have reviewed the triage vital signs and the nursing notes.   HISTORY  Chief Complaint Fall   HPI [name] is a 37 y.o. male presents to the ED with complaint of his knees feeling "wobbly".  Patient states that this been going on for some time.  He states that he was walking into the dialysis center where he again had problems with both knees.  He states he tried to grab a trash can and missed.  He hit his forehead on a brick wall but denies any loss of consciousness, vision changes, nausea or vomiting.  He denies any cervical pain.  Patient states he has had problems with his knees for "a long time".  He does not feel unstable or shaky.  He rates his pain as a 2 out of 10.  He states the other wise feels fine but dialysis nurses wanted him checked out anyway.  Past Medical History:  Diagnosis Date  . Anemia   . CKD (chronic kidney disease)   . 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 Depression   . Diastolic CHF (HCC)   . Generalized anxiety disorder   . Hyperlipidemia   . Hypertension   . Hypertension   . Hypothyroidism   . Lymph edema   . Lymphedema   . Muscle weakness (generalized)   . Nephrotic syndrome   . Obesity   . Pleural effusion   . Pulmonary hypertension (HCC)   . Respiratory failure with hypoxia (HCC)   . Sleep apnea    CPAP  . Vitamin C deficiency     Patient Active Problem List   Diagnosis Date Noted  . Malfunction of arteriovenous dialysis fistula (HCC) 02/23/2018  . PAD (peripheral artery disease) (HCC) 10/22/2017  . ESRD on dialysis (HCC) 08/22/2017  . Essential hypertension 08/22/2017  . Protein-calorie malnutrition, severe 04/30/2017  . 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 Acute on chronic respiratory failure with hypoxia and  hypercapnia (HCC)   . Pleural effusion, bilateral   . Postprocedural pneumothorax   . ARF (acute renal failure) (HCC) 03/30/2017    Past Surgical History:  Procedure Laterality Date  . A/V FISTULAGRAM Left 02/27/2018   Procedure: A/V FISTULAGRAM;  Surgeon: Annice Needy, MD;  Location: [facility];  Service: Cardiovascular;  Laterality: Left;  . A/V FISTULAGRAM Left 04/30/2018   Procedure: A/V FISTULAGRAM;  Surgeon: Annice Needy, MD;  Location: [facility];  Service: Cardiovascular;  Laterality: Left;  . AV FISTULA PLACEMENT Left 10/05/2017   Procedure: INSERTION OF ARTERIOVENOUS (AV) GORE-TEX GRAFT ARM ( BRACHIAL AXILLARY );  Surgeon: Renford Dills, MD;  Location: [facility];  Service: Vascular;  Laterality: Left;  . CARDIAC CATHETERIZATION    . DIALYSIS/PERMA CATHETER INSERTION N/A 04/02/2017   Procedure: DIALYSIS/PERMA CATHETER INSERTION;  Surgeon: Annice Needy, MD;  Location: [facility];  Service: Cardiovascular;  Laterality: N/A;  . DIALYSIS/PERMA CATHETER INSERTION N/A 04/25/2017   Procedure: DIALYSIS/PERMA CATHETER INSERTION;  Surgeon: Annice Needy, MD;  Location: [facility];  Service: Cardiovascular;  Laterality: N/A;  . DIALYSIS/PERMA CATHETER REMOVAL N/A 11/19/2017   Procedure: DIALYSIS/PERMA CATHETER REMOVAL;  Surgeon: Annice Needy, MD;  Location: [facility];  Service: Cardiovascular;  Laterality: N/A;  . UPPER GI ENDOSCOPY  Prior to Admission medications   Medication Sig Start Date End Date Taking? Authorizing Provider  apixaban (ELIQUIS) 5 MG TABS tablet Take 1 tablet (5 mg total) by mouth 2 (two) times daily. 05/02/17   Alford HighlandWieting, Richard, MD  buPROPion (WELLBUTRIN SR) 150 MG 12 hr tablet Take 150 mg by mouth 2 (two) times daily.    [provider]  collagenase (SANTYL) ointment Apply topically.    [provider]  docusate sodium (COLACE) 100 MG capsule Take 1 capsule (100 mg total) by mouth 2 (two) times daily.  04/13/17   Shaune Pollackhen, Qing, MD  ferrous sulfate 325 (65 FE) MG tablet Take 1 tablet (325 mg total) by mouth daily at 12 noon. Patient taking differently: Take 325 mg by mouth daily.  04/14/17   Shaune Pollackhen, Qing, MD  gabapentin (NEURONTIN) 100 MG capsule Take 100-300 mg by mouth See admin instructions. Take 100 mg by mouth in the evening on Tuesday, Thursday and Saturday. Take 300 mg by mouth in the evening on all other days    [provider]  HYDROcodone-acetaminophen (NORCO) 5-325 MG tablet Take 1-2 tablets by mouth every 6 (six) hours as needed for moderate pain or severe pain. 10/05/17   Schnier, Latina CraverGregory G, MD  levothyroxine (SYNTHROID, LEVOTHROID) 75 MCG tablet Take 75 mcg by mouth daily before breakfast.    [provider]  losartan (COZAAR) 100 MG tablet Take 100 mg by mouth at bedtime.     [provider]  metoprolol tartrate (LOPRESSOR) 25 MG tablet Take 25 mg by mouth 2 (two) times daily.    [provider]  multivitamin (RENA-VIT) TABS tablet Take 1 tablet by mouth at bedtime. 04/13/17   Shaune Pollackhen, Qing, MD  multivitamin-lutein [facility](OCUVITE-LUTEIN) CAPS capsule Take 1 capsule by mouth daily.    [provider]  nystatin (MYCOSTATIN/NYSTOP) powder Apply topically.    [provider]  ondansetron (ZOFRAN) 4 MG/2ML SOLN injection Inject 4 mg into the muscle every 6 (six) hours as needed for nausea or vomiting.    [provider]  sertraline (ZOLOFT) 25 MG tablet Take 25 mg by mouth daily.     [provider]  sevelamer carbonate (RENVELA) 800 MG tablet Take 800 mg by mouth 3 (three) times daily with meals.     [provider]  torsemide (DEMADEX) 20 MG tablet Take 2 tablets (40 mg total) by mouth daily. Patient taking differently: Take 40 mg by mouth 4 (four) times a week. Monday, Wednesday, Friday and Sunday 05/02/17   Alford HighlandWieting, Richard, MD  triamcinolone ointment (KENALOG) 0.1 % Apply twice a day to affected areas. 11/28/17   [provider]    Allergies Morphine and Morphine and related  Family History  Problem Relation Age of Onset  . Hypertension Mother     Social History Social History   Tobacco Use  . Smoking status: Passive Smoke Exposure - Never Smoker  . Smokeless tobacco: Never Used  Substance Use Topics  . Alcohol use: No  . Drug use: No    Review of Systems Constitutional: No fever/chills Eyes: No visual changes. ENT: No trauma. Cardiovascular: Denies chest pain. Respiratory: Denies shortness of breath. Gastrointestinal: No abdominal pain.  No nausea, no vomiting.  No diarrhea.  Musculoskeletal: Positive for chronic and acute bilateral knee pain. Skin: Positive for abrasion. Neurological: Negative for headaches, focal weakness or numbness. ____________________________________________   PHYSICAL EXAM:  VITAL SIGNS: ED Triage Vitals [05/04/18 0656]  Enc Vitals Group     BP  129/82     Pulse Rate 71     Resp 16     Temp 98.3 F (36.8 C)     Temp Source Oral     SpO2 98 %     Weight 251 lb (113.9 kg)     Height 5\' 11"  (1.803 m)     Head Circumference      Peak Flow      Pain Score 2     Pain Loc      Pain Edu?      Excl. in GC?    Constitutional: Alert and oriented. Well appearing and in no acute distress. Eyes: Conjunctivae are normal. PERRL. EOMI. Head: Atraumatic.  Right forehead has a very superficial abrasion without any bleeding or drainage from the area.  There is no soft tissue swelling in the area.  Area is nontender to palpation. Nose: No abrasion or discoloration noted. Neck: No stridor.  No cervical tenderness on palpation posteriorly.  Range of motion is without restriction. Cardiovascular: Normal rate, regular rhythm. Grossly normal heart sounds.  Good peripheral circulation. Respiratory: Normal respiratory effort.  No retractions. Lungs CTAB. Musculoskeletal: Examination of bilateral knees appear to be degenerative in nature.  With range of motion there  was severe crepitus noted bilaterally.  No warmth or redness is noted.  There was some minimal point tenderness on palpation of the anterior portion of the knee.  Skin is intact.  Pulses present.  Motor sensory function intact.  No edema noted lower extremities. Neurologic:  Normal speech and language. No gross focal neurologic deficits are appreciated.  Skin:  Skin is warm, dry and intact.  Superficial abrasion to the right forehead as noted above. Psychiatric: Mood and affect are normal. Speech and behavior are normal.  ____________________________________________   LABS (all labs ordered are listed, but only abnormal results are displayed)  Labs Reviewed  CBC WITH DIFFERENTIAL/PLATELET - Abnormal; Notable for the following components:      Result Value   RBC 3.93 (*)    Hemoglobin 12.8 (*)    HCT 36.7 (*)    RDW 15.6 (*)    All other components within normal limits  COMPREHENSIVE METABOLIC PANEL - Abnormal; Notable for the following components:   Chloride 92 (*)    BUN 55 (*)    Creatinine, Ser 7.86 (*)    Calcium 8.4 (*)    GFR calc non Af Amer 8 (*)    GFR calc Af Amer 9 (*)    Anion gap 16 (*)    All other components within normal limits    RADIOLOGY  ED MD interpretation:   Bilateral knee x-rays are negative for acute injury.  Official radiology report(s): Dg Knee Complete 4 Views Left  Result Date: 05/04/2018 CLINICAL DATA:  Knee pain secondary to a fall. EXAM: LEFT KNEE - COMPLETE 4+ VIEW COMPARISON:  None. FINDINGS: No evidence of fracture, dislocation, or joint effusion. No evidence of arthropathy or other focal bone abnormality. Soft tissues are unremarkable. IMPRESSION: Negative. Electronically Signed   By: Francene Boyers M.D.   On: 05/04/2018 09:07   Dg Knee Complete 4 Views Right  Result Date: 05/04/2018 CLINICAL DATA:  Knee pain secondary to a fall. EXAM: RIGHT KNEE - COMPLETE 4+ VIEW COMPARISON:  None. FINDINGS: No evidence of fracture, dislocation, or joint  effusion. No evidence of arthropathy or other focal bone abnormality. Osteopenia. Soft tissues are unremarkable. IMPRESSION: No acute abnormality.  Osteopenia. Electronically Signed   By: Francene Boyers  M.D.   On: 05/04/2018 09:08    ____________________________________________   PROCEDURES  Procedure(s) performed: None  Procedures  Critical Care performed: No  ____________________________________________   INITIAL IMPRESSION / ASSESSMENT AND PLAN / ED COURSE  As part of my medical decision making, I reviewed the following data within the electronic MEDICAL RECORD NUMBER Notes from prior ED visits and [place] Controlled Substance Database  Patient presents to the ED after his knees became unstable as he was walking into dialysis.  He states he grabbed a trash can to support him and missed causing an abrasion to his right forehead.  He denies any head injury or loss of consciousness.  There is been no nausea, vomiting or visual changes.  Patient lab work was enlarged to lab work done in the past.  Patient is released to return to have dialysis however he states that he is not going to go to dialysis but go home instead.  Patient was encouraged to call the dialysis center.  Travel arrangements were made for him.   ____________________________________________   FINAL CLINICAL IMPRESSION(S) / ED DIAGNOSES  Final diagnoses:  Pain in both knees, unspecified chronicity  Fall, initial encounter     ED Discharge Orders    None       Note:  This document was prepared using Dragon voice recognition software and may include unintentional dictation errors.    Tommi Rumps, PA-C 05/04/18 1445    Willy Eddy, MD 05/04/18 1535

## 2018-05-04 NOTE — ED Triage Notes (Signed)
Pt states he was walking into dialysis when his knees "became wobbly". Pt states he tried to grab a trash can, missed and hit head on brick wall. Pt with abrasion noted to right forehead. No loc per pt.

## 2018-05-15 ENCOUNTER — Other Ambulatory Visit: Payer: Self-pay

## 2018-05-15 ENCOUNTER — Emergency Department
Admission: EM | Admit: 2018-05-15 | Discharge: 2018-05-15 | Disposition: A | Discharge status: Home / Self Care | Payer: Medicaid Other | Attending: Emergency Medicine | Admitting: Emergency Medicine

## 2018-05-15 ENCOUNTER — Encounter: Payer: Self-pay | Admitting: Emergency Medicine

## 2018-05-15 DIAGNOSIS — Z7901 Long term (current) use of anticoagulants: Secondary | ICD-10-CM | POA: Insufficient documentation

## 2018-05-15 DIAGNOSIS — Z992 Dependence on renal dialysis: Secondary | ICD-10-CM | POA: Insufficient documentation

## 2018-05-15 DIAGNOSIS — Z7722 Contact with and (suspected) exposure to environmental tobacco smoke (acute) (chronic): Secondary | ICD-10-CM | POA: Insufficient documentation

## 2018-05-15 DIAGNOSIS — N186 End stage renal disease: Secondary | ICD-10-CM | POA: Diagnosis not present

## 2018-05-15 DIAGNOSIS — R519 Headache, unspecified: Secondary | ICD-10-CM

## 2018-05-15 DIAGNOSIS — I132 Hypertensive heart and chronic kidney disease with heart failure and with stage 5 chronic kidney disease, or end stage renal disease: Secondary | ICD-10-CM | POA: Insufficient documentation

## 2018-05-15 DIAGNOSIS — R51 Headache: Secondary | ICD-10-CM | POA: Diagnosis present

## 2018-05-15 DIAGNOSIS — Z79899 Other long term (current) drug therapy: Secondary | ICD-10-CM | POA: Insufficient documentation

## 2018-05-15 DIAGNOSIS — I5030 Unspecified diastolic (congestive) heart failure: Secondary | ICD-10-CM | POA: Insufficient documentation

## 2018-05-15 DIAGNOSIS — E039 Hypothyroidism, unspecified: Secondary | ICD-10-CM | POA: Diagnosis not present

## 2018-05-15 DIAGNOSIS — N189 Chronic kidney disease, unspecified: Secondary | ICD-10-CM | POA: Diagnosis not present

## 2018-05-15 NOTE — ED Triage Notes (Signed)
Patient presents to the ED via EMS for a headache.  Patient had dialysis treatment today and still has the dialysis catheter in place.  Patient states everything is better now.  Patient states headache was severe during dialysis and states he began dry heaving during dialysis.  Patient states, "I think it might have been someone's perfume but I feel fine now."

## 2018-05-15 NOTE — ED Notes (Signed)
Pt brought in via [facility] with complaints of a headache after dialysis, VSS. NAD

## 2018-05-15 NOTE — ED Notes (Signed)
Pt taken to get dialysis via wheelchair. NAD. All questions or concerns were addressed.

## 2018-05-15 NOTE — ED Provider Notes (Signed)
[facility] Emergency Department Provider Note  ____________________________________________   I have reviewed the triage vital signs and the nursing notes. Where available I have reviewed prior notes and, if possible and indicated, outside hospital notes.    HISTORY  Chief Complaint Let me go home   HPI [name] is a 37 y.o. male  With a history he states of migraines, he smoked someone's perfume during dialysis and had a headache.  Patient states he had a scope of the gone.  Not the worst headache of life.  Gradual onset.  Headaches that he is had multiple times in his life.  He states this is been well.  No vomiting no stiff neck, gradual onset, completely resolved.  Patient refuses any thought of further work-up.  He wants [place] to remove his dialysis catheter, which is still in place, and he wants to go home. He has his bag it at the bedside.  He has his clothes on and he wants to leave     Past Medical History:  Diagnosis Date  . Anemia   . CKD (chronic kidney disease)   . 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 Depression   . Diastolic CHF (HCC)   . Generalized anxiety disorder   . Hyperlipidemia   . Hypertension   . Hypertension   . Hypothyroidism   . Lymph edema   . Lymphedema   . Muscle weakness (generalized)   . Nephrotic syndrome   . Obesity   . Pleural effusion   . Pulmonary hypertension (HCC)   . Respiratory failure with hypoxia (HCC)   . Sleep apnea    CPAP  . Vitamin C deficiency     Patient Active Problem List   Diagnosis Date Noted  . Malfunction of arteriovenous dialysis fistula (HCC) 02/23/2018  . PAD (peripheral artery disease) (HCC) 10/22/2017  . ESRD on dialysis (HCC) 08/22/2017  . Essential hypertension 08/22/2017  . Protein-calorie malnutrition, severe 04/30/2017  . 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 Acute on chronic respiratory failure with hypoxia and hypercapnia (HCC)   . Pleural effusion,  bilateral   . Postprocedural pneumothorax   . ARF (acute renal failure) (HCC) 03/30/2017    Past Surgical History:  Procedure Laterality Date  . A/V FISTULAGRAM Left 02/27/2018   Procedure: A/V FISTULAGRAM;  Surgeon: Annice Needy, MD;  Location: [facility] INVASIVE CV LAB;  Service: Cardiovascular;  Laterality: Left;  . A/V FISTULAGRAM Left 04/30/2018   Procedure: A/V FISTULAGRAM;  Surgeon: Annice Needy, MD;  Location: [facility] INVASIVE CV LAB;  Service: Cardiovascular;  Laterality: Left;  . AV FISTULA PLACEMENT Left 10/05/2017   Procedure: INSERTION OF ARTERIOVENOUS (AV) GORE-TEX GRAFT ARM ( BRACHIAL AXILLARY );  Surgeon: Renford Dills, MD;  Location: [facility] ORS;  Service: Vascular;  Laterality: Left;  . CARDIAC CATHETERIZATION    . DIALYSIS/PERMA CATHETER INSERTION N/A 04/02/2017   Procedure: DIALYSIS/PERMA CATHETER INSERTION;  Surgeon: Annice Needy, MD;  Location: [facility] INVASIVE CV LAB;  Service: Cardiovascular;  Laterality: N/A;  . DIALYSIS/PERMA CATHETER INSERTION N/A 04/25/2017   Procedure: DIALYSIS/PERMA CATHETER INSERTION;  Surgeon: Annice Needy, MD;  Location: [facility] INVASIVE CV LAB;  Service: Cardiovascular;  Laterality: N/A;  . DIALYSIS/PERMA CATHETER REMOVAL N/A 11/19/2017   Procedure: DIALYSIS/PERMA CATHETER REMOVAL;  Surgeon: Annice Needy, MD;  Location: [facility] INVASIVE CV LAB;  Service: Cardiovascular;  Laterality: N/A;  . UPPER GI ENDOSCOPY      Prior to Admission medications  Medication Sig Start Date End Date Taking? Authorizing Provider  apixaban (ELIQUIS) 5 MG TABS tablet Take 1 tablet (5 mg total) by mouth 2 (two) times daily. 05/02/17   Alford Highland, MD  buPROPion (WELLBUTRIN SR) 150 MG 12 hr tablet Take 150 mg by mouth 2 (two) times daily.    [provider]  collagenase (SANTYL) ointment Apply topically.    [provider]  docusate sodium (COLACE) 100 MG capsule Take 1 capsule (100 mg total) by mouth 2 (two) times daily. 04/13/17   Shaune Pollack, MD  ferrous  sulfate 325 (65 FE) MG tablet Take 1 tablet (325 mg total) by mouth daily at 12 noon. Patient taking differently: Take 325 mg by mouth daily.  04/14/17   Shaune Pollack, MD  gabapentin (NEURONTIN) 100 MG capsule Take 100-300 mg by mouth See admin instructions. Take 100 mg by mouth in the evening on Tuesday, Thursday and Saturday. Take 300 mg by mouth in the evening on all other days    [provider]  HYDROcodone-acetaminophen (NORCO) 5-325 MG tablet Take 1-2 tablets by mouth every 6 (six) hours as needed for moderate pain or severe pain. 10/05/17   Schnier, Latina Craver, MD  levothyroxine (SYNTHROID, LEVOTHROID) 75 MCG tablet Take 75 mcg by mouth daily before breakfast.    [provider]  losartan (COZAAR) 100 MG tablet Take 100 mg by mouth at bedtime.     [provider]  metoprolol tartrate (LOPRESSOR) 25 MG tablet Take 25 mg by mouth 2 (two) times daily.    [provider]  multivitamin (RENA-VIT) TABS tablet Take 1 tablet by mouth at bedtime. 04/13/17   Shaune Pollack, MD  multivitamin-lutein [facility]) CAPS capsule Take 1 capsule by mouth daily.    [provider]  nystatin (MYCOSTATIN/NYSTOP) powder Apply topically.    [provider]  ondansetron (ZOFRAN) 4 MG/2ML SOLN injection Inject 4 mg into the muscle every 6 (six) hours as needed for nausea or vomiting.    [provider]  sertraline (ZOLOFT) 25 MG tablet Take 25 mg by mouth daily.     [provider]  sevelamer carbonate (RENVELA) 800 MG tablet Take 800 mg by mouth 3 (three) times daily with meals.     [provider]  torsemide (DEMADEX) 20 MG tablet Take 2 tablets (40 mg total) by mouth daily. Patient taking differently: Take 40 mg by mouth 4 (four) times a week. Monday, Wednesday, Friday and Sunday 05/02/17   Alford Highland, MD  triamcinolone ointment (KENALOG) 0.1 % Apply twice a day to affected areas. 11/28/17   [provider]     Allergies Morphine and Morphine and related  Family History  Problem Relation Age of Onset  . Hypertension Mother     Social History Social History   Tobacco Use  . Smoking status: Passive Smoke Exposure - Never Smoker  . Smokeless tobacco: Never Used  Substance Use Topics  . Alcohol use: No  . Drug use: No    Review of Systems Constitutional: No fever/chills Eyes: No visual changes. ENT: No sore throat. No stiff neck no neck pain Cardiovascular: Denies chest pain. Respiratory: Denies shortness of breath. Gastrointestinal:   no vomiting.  No diarrhea.  No constipation. Genitourinary: Negative for dysuria. Musculoskeletal: Negative lower extremity swelling Skin: Negative for rash. Neurological: Negative for severe headaches, focal weakness or numbness.   ____________________________________________   PHYSICAL EXAM:  VITAL SIGNS: ED Triage Vitals  Enc Vitals Group     BP  05/15/18 1513 130/80     Pulse Rate 05/15/18 1513 98     Resp 05/15/18 1513 16     Temp 05/15/18 1513 98 F (36.7 C)     Temp Source 05/15/18 1513 Oral     SpO2 05/15/18 1513 100 %     Weight 05/15/18 1515 268 lb 11.9 oz (121.9 kg)     Height 05/15/18 1515 5\' 10"  (1.778 m)     Head Circumference --      Peak Flow --      Pain Score 05/15/18 1515 0     Pain Loc --      Pain Edu? --      Excl. in GC? --     Constitutional: Alert and oriented. Well appearing and in no acute distress. Eyes: Conjunctivae are normal Head: Atraumatic HEENT: No congestion/rhinnorhea. Mucous membranes are moist.  Oropharynx non-erythematous Neck:   Nontender with no meningismus, no masses, no stridor Cardiovascular: Normal rate, regular rhythm. Grossly normal heart sounds.  Good peripheral circulation. Respiratory: Normal respiratory effort.  No retractions. Lungs CTAB. Abdominal: Soft and nontender. No distention. No guarding no rebound Back:  There is no focal tenderness or step off.  there is no midline  tenderness there are no lesions noted. there is no CVA tenderness Musculoskeletal: No lower extremity tenderness, no upper extremity tenderness. No joint effusions, no DVT signs strong distal pulses no edema my dialysis catheter in place with no evidence of complication in the left upper fistula Neurologic:  Normal speech and language. No gross focal neurologic deficits are appreciated.  Psychiatric: Mood and affect are normal. Speech and behavior are normal.  ____________________________________________   LABS (all labs ordered are listed, but only abnormal results are displayed)  Labs Reviewed - No data to display  Pertinent labs  results that were available during my care of the patient were reviewed by me and considered in my medical decision making (see chart for details). ____________________________________________  EKG  I personally interpreted any EKGs ordered by me or triage  ____________________________________________  RADIOLOGY  Pertinent labs & imaging results that were available during my care of the patient were reviewed by me and considered in my medical decision making (see chart for details). If possible, patient and/or family made aware of any abnormal findings.  No results found. ____________________________________________    PROCEDURES  Procedure(s) performed: None  Procedures  Critical Care performed: None  ____________________________________________   INITIAL IMPRESSION / ASSESSMENT AND PLAN / ED COURSE  Pertinent labs & imaging results that were available during my care of the patient were reviewed by me and considered in my medical decision making (see chart for details).  She apparently had a headache which is now gone, he has no complaints he refuses any work-up and is eager to go home.  We are not allowed to pull his catheter, so we will send him downstairs with a can do that.  Patient is neurologically intact at this time and has no  complaints.  Given that he is I think capable of making his own decisions and refuses any further work-up we will discharge him at his own request.  He understands any time he can come back to the emergency room if he feels worse.  Return precautions follow-up given understood.  Patient neurologically intact at discharge.    ____________________________________________   FINAL CLINICAL IMPRESSION(S) / ED DIAGNOSES  Final diagnoses:  None      This chart was dictated using voice recognition software.  Despite best efforts to proofread,  errors can occur which can change meaning.      Jeanmarie Plant, MD 05/15/18 [phone]

## 2018-05-15 NOTE — ED Notes (Signed)
Pt waiting for dialysis to deaccsess, then he will be able to be discharged.

## 2018-05-15 NOTE — Discharge Instructions (Addendum)
You would prefer not to have any kind of work-up for your headache, this is certainly your choice but it does limit our ability to further care for your diagnose you.  If you have worsening headache or other new or worrisome symptoms, return to the emergency room.  If you have a severe headache, or feel any numbness or weakness or other neurologic findings, please call 911.  Stay with family today.

## 2018-05-30 ENCOUNTER — Other Ambulatory Visit: Payer: Self-pay | Admitting: Neurology

## 2018-05-30 DIAGNOSIS — R404 Transient alteration of awareness: Secondary | ICD-10-CM

## 2018-05-30 DIAGNOSIS — G253 Myoclonus: Secondary | ICD-10-CM

## 2018-06-11 ENCOUNTER — Other Ambulatory Visit (INDEPENDENT_AMBULATORY_CARE_PROVIDER_SITE_OTHER): Payer: Self-pay | Admitting: Nurse Practitioner

## 2018-06-11 DIAGNOSIS — N186 End stage renal disease: Secondary | ICD-10-CM

## 2018-06-12 ENCOUNTER — Ambulatory Visit (INDEPENDENT_AMBULATORY_CARE_PROVIDER_SITE_OTHER): Payer: Medicare Other

## 2018-06-12 ENCOUNTER — Ambulatory Visit (INDEPENDENT_AMBULATORY_CARE_PROVIDER_SITE_OTHER): Payer: Medicare Other | Admitting: Nurse Practitioner

## 2018-06-12 ENCOUNTER — Encounter (INDEPENDENT_AMBULATORY_CARE_PROVIDER_SITE_OTHER): Payer: Self-pay

## 2018-06-12 ENCOUNTER — Encounter (INDEPENDENT_AMBULATORY_CARE_PROVIDER_SITE_OTHER): Payer: Self-pay | Admitting: Nurse Practitioner

## 2018-06-12 VITALS — BP 122/73 | HR 74 | Resp 17 | Ht 70.0 in | Wt 243.0 lb

## 2018-06-12 DIAGNOSIS — T82590D Other mechanical complication of surgically created arteriovenous fistula, subsequent encounter: Secondary | ICD-10-CM

## 2018-06-12 DIAGNOSIS — N186 End stage renal disease: Secondary | ICD-10-CM | POA: Diagnosis not present

## 2018-06-12 DIAGNOSIS — I10 Essential (primary) hypertension: Secondary | ICD-10-CM | POA: Diagnosis not present

## 2018-06-12 DIAGNOSIS — R600 Localized edema: Secondary | ICD-10-CM | POA: Diagnosis not present

## 2018-06-12 NOTE — Progress Notes (Signed)
Subjective:    Patient ID: [name], male    DOB: 1981/01/12, 37 y.o.   MRN: [id number] Chief Complaint  Patient presents with  . Follow-up    5 week [facility] HDA follow up    HPI  [name] is a 37 y.o. male The patient returns to the office for followup status post intervention of the dialysis access the axillary graft. Following the intervention the excess function was unchanged per the patient.  The patient continues to be experiencing increased bleeding times following decannulation and increased recirculation with diminished efficiency of their dialysis. The patient denies an increase in arm swelling. At the present time the patient denies hand pain.  The patient denies amaurosis fugax or recent TIA symptoms. There are no recent neurological changes noted. The patient denies claudication symptoms or rest pain symptoms. The patient denies history of DVT, PE or superficial thrombophlebitis. The patient denies recent episodes of angina or shortness of breath.   The patient underwent a hemodialysis access duplex today which revealed a flow volume of 1526.  There were velocities of greater than 800 in the mid graft.  Per the ultrasonographer report the stent placed at mid arm appears to be curling the opening into the anterior wall of the AV graft.  This significantly narrows the flow and at times occludes it.  Past Medical History:  Diagnosis Date  . Anemia   . CKD (chronic kidney disease)   . 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 Depression   . Diastolic CHF (HCC)   . Generalized anxiety disorder   . Hyperlipidemia   . Hypertension   . Hypertension   . Hypothyroidism   . Lymph edema   . Lymphedema   . Muscle weakness (generalized)   . Nephrotic syndrome   . Obesity   . Pleural effusion   . Pulmonary hypertension (HCC)   . Respiratory failure with hypoxia (HCC)   . Sleep apnea    CPAP  . Vitamin C deficiency     Past Surgical History:  Procedure  Laterality Date  . A/V FISTULAGRAM Left 02/27/2018   Procedure: A/V FISTULAGRAM;  Surgeon: Annice Needy, MD;  Location: [facility] INVASIVE CV LAB;  Service: Cardiovascular;  Laterality: Left;  . A/V FISTULAGRAM Left 04/30/2018   Procedure: A/V FISTULAGRAM;  Surgeon: Annice Needy, MD;  Location: [facility] INVASIVE CV LAB;  Service: Cardiovascular;  Laterality: Left;  . AV FISTULA PLACEMENT Left 10/05/2017   Procedure: INSERTION OF ARTERIOVENOUS (AV) GORE-TEX GRAFT ARM ( BRACHIAL AXILLARY );  Surgeon: Renford Dills, MD;  Location: [facility] ORS;  Service: Vascular;  Laterality: Left;  . CARDIAC CATHETERIZATION    . DIALYSIS/PERMA CATHETER INSERTION N/A 04/02/2017   Procedure: DIALYSIS/PERMA CATHETER INSERTION;  Surgeon: Annice Needy, MD;  Location: [facility] INVASIVE CV LAB;  Service: Cardiovascular;  Laterality: N/A;  . DIALYSIS/PERMA CATHETER INSERTION N/A 04/25/2017   Procedure: DIALYSIS/PERMA CATHETER INSERTION;  Surgeon: Annice Needy, MD;  Location: [facility] INVASIVE CV LAB;  Service: Cardiovascular;  Laterality: N/A;  . DIALYSIS/PERMA CATHETER REMOVAL N/A 11/19/2017   Procedure: DIALYSIS/PERMA CATHETER REMOVAL;  Surgeon: Annice Needy, MD;  Location: [facility] INVASIVE CV LAB;  Service: Cardiovascular;  Laterality: N/A;  . UPPER GI ENDOSCOPY      Social History   Socioeconomic History  . Marital status: Single    Spouse name: Not on file  . Number of children: Not on file  . Years of education: Not on file  . Highest  education level: Not on file  Occupational History  . Not on file  Social Needs  . Financial resource strain: Not on file  . Food insecurity:    Worry: Not on file    Inability: Not on file  . Transportation needs:    Medical: Not on file    Non-medical: Not on file  Tobacco Use  . Smoking status: Passive Smoke Exposure - Never Smoker  . Smokeless tobacco: Never Used  Substance and Sexual Activity  . Alcohol use: No  . Drug use: No  . Sexual activity: Not on file  Lifestyle  . Physical  activity:    Days per week: Not on file    Minutes per session: Not on file  . Stress: Not on file  Relationships  . Social connections:    Talks on phone: Not on file    Gets together: Not on file    Attends religious service: Not on file    Active member of club or organization: Not on file    Attends meetings of clubs or organizations: Not on file    Relationship status: Not on file  . Intimate partner violence:    Fear of current or ex partner: Not on file    Emotionally abused: Not on file    Physically abused: Not on file    Forced sexual activity: Not on file  Other Topics Concern  . Not on file  Social History Narrative   From [facility]   Has a walker    Family History  Problem Relation Age of Onset  . Hypertension Mother     Allergies  Allergen Reactions  . Morphine     Other reaction(s): Other (See Comments) Hallucinations  . Morphine And Related Other (See Comments)    Hallucinations Hallucinations     Review of Systems   Review of Systems: Negative Unless Checked Constitutional: [] Weight loss  [] Fever  [] Chills Cardiac: [] Chest pain   []  Atrial Fibrillation  [] Palpitations   [] Shortness of breath when laying flat   [] Shortness of breath with exertion. Vascular:  [] Pain in legs with walking   [] Pain in legs with standing  [] History of DVT   [] Phlebitis   [] Swelling in legs   [] Varicose veins   [] Non-healing ulcers Pulmonary:   [] Uses home oxygen   [] Productive cough   [] Hemoptysis   [] Wheeze  [] COPD   [] Asthma Neurologic:  [] Dizziness   [] Seizures   [] History of stroke   [] History of TIA  [] Aphasia   [] Vissual changes   [] Weakness or numbness in arm   [] Weakness or numbness in leg Musculoskeletal:   [] Joint swelling   [] Joint pain   [] Low back pain  []  History of Knee Replacement Hematologic:  [] Easy bruising  [] Easy bleeding   [] Hypercoagulable state   [] Anemic Gastrointestinal:  [] Diarrhea   [] Vomiting  [] Gastroesophageal reflux/heartburn    [] Difficulty swallowing. Genitourinary:  [] Chronic kidney disease   [] Difficult urination  [] Anuric   [] Blood in urine Skin:  [] Rashes   [] Ulcers  Psychological:  [] History of anxiety   []  History of major depression  []  Memory Difficulties     Objective:   Physical Exam  BP 122/73 (BP Location: Right Arm, Patient Position: Sitting)   Pulse 74   Resp 17   Ht 5\' 10"  (1.778 m)   Wt 243 lb (110.2 kg)   BMI 34.87 kg/m   Gen: WD/WN, NAD Head: [place]/AT, No temporalis wasting.  Ear/Nose/Throat: Hearing grossly intact, nares w/o erythema  or drainage Eyes: PER, EOMI, sclera nonicteric.  Neck: Supple, no masses.  No JVD.  Pulmonary:  Good air movement, no use of accessory muscles.  Cardiac: RRR Vascular:  Good thrill and bruit in proximal area of graft.  Below the midportion of the graft (at distal end) there is no thrill and very soft bruit Vessel Right Left  Radial Palpable Palpable   Gastrointestinal: soft, non-distended. No guarding/no peritoneal signs.  Musculoskeletal: M/S 5/5 throughout.  No deformity or atrophy.  Neurologic: Pain and light touch intact in extremities.  Symmetrical.  Speech is fluent. Motor exam as listed above. Psychiatric: Judgment intact, Mood & affect appropriate for pt's clinical situation. Dermatologic: No Venous rashes. No Ulcers Noted.  No changes consistent with cellulitis. Lymph : No Cervical lymphadenopathy, no lichenification or skin changes of chronic lymphedema.      Assessment & Plan:   1. Malfunction of arteriovenous dialysis fistula, subsequent encounter The patient underwent a hemodialysis access duplex today which revealed a flow volume of 1526.  There were velocities of greater than 800 in the mid graft.  Per the ultrasonographer report the stent placed at mid arm appears to be curling the opening into the anterior wall of the AV graft.  This significantly narrows the flow and at times occludes it.  Recommend:  The patient is experiencing  increasing problems with their dialysis access.  Patient should have a shuntogram with the intention for intervention.  The intention for intervention is to restore appropriate flow and prevent thrombosis and possible loss of the access.  As well as improve the quality of dialysis therapy.  The risks, benefits and alternative therapies were reviewed in detail with the patient.  All questions were answered.  The patient agrees to proceed with angio/intervention.      2. Essential hypertension Continue antihypertensive medications as already ordered, these medications have been reviewed and there are no changes at this time.   3. Lower extremity edema Well controlled today, minimal swelling. Advised to wear medical grade 1 compression stockings with    Current Outpatient Medications on File Prior to Visit  Medication Sig Dispense Refill  . apixaban (ELIQUIS) 5 MG TABS tablet Take 1 tablet (5 mg total) by mouth 2 (two) times daily. 60 tablet 0  . b complex-vitamin c-folic acid (NEPHRO-VITE) 0.8 MG TABS tablet Take 1 tablet by mouth at bedtime.    [name] buPROPion (WELLBUTRIN SR) 150 MG 12 hr tablet Take 150 mg by mouth 2 (two) times daily.    . collagenase (SANTYL) ointment Apply topically.    . docusate sodium (COLACE) 100 MG capsule Take 1 capsule (100 mg total) by mouth 2 (two) times daily. 10 capsule 0  . ferrous sulfate 325 (65 FE) MG tablet Take 1 tablet (325 mg total) by mouth daily at 12 noon. (Patient taking differently: Take 325 mg by mouth daily. )  3  . gabapentin (NEURONTIN) 100 MG capsule Take 100-300 mg by mouth See admin instructions. Take 100 mg by mouth in the evening on Tuesday, Thursday and Saturday. Take 300 mg by mouth in the evening on all other days    . HYDROcodone-acetaminophen (NORCO) 5-325 MG tablet Take 1-2 tablets by mouth every 6 (six) hours as needed for moderate pain or severe pain. 50 tablet 0  . levothyroxine (SYNTHROID, LEVOTHROID) 75 MCG tablet Take 75 mcg by  mouth daily before breakfast.    . losartan (COZAAR) 100 MG tablet Take 100 mg by mouth at bedtime.     [name]  metoprolol tartrate (LOPRESSOR) 25 MG tablet Take 25 mg by mouth 2 (two) times daily.    . Multiple Vitamin (THERA) TABS Take by mouth.    . nystatin (MYCOSTATIN/NYSTOP) powder Apply topically.    . ondansetron (ZOFRAN) 4 MG/2ML SOLN injection Inject 4 mg into the muscle every 6 (six) hours as needed for nausea or vomiting.    . sertraline (ZOLOFT) 25 MG tablet Take 25 mg by mouth daily.     . sevelamer carbonate (RENVELA) 800 MG tablet Take 800 mg by mouth 3 (three) times daily with meals.     . torsemide (DEMADEX) 20 MG tablet Take 2 tablets (40 mg total) by mouth daily. (Patient taking differently: Take 40 mg by mouth 4 (four) times a week. Monday, Wednesday, Friday and Sunday) 30 tablet 0  . triamcinolone ointment (KENALOG) 0.1 % Apply twice a day to affected areas.    . vitamin C (ASCORBIC ACID) 500 MG tablet Take by mouth.     No current facility-administered medications on file prior to visit.     There are no Patient Instructions on file for this visit. No follow-ups on file.   Georgiana Spinner, NP  This note was completed with Office manager.  Any errors are purely unintentional.

## 2018-06-13 ENCOUNTER — Encounter (INDEPENDENT_AMBULATORY_CARE_PROVIDER_SITE_OTHER): Payer: Self-pay | Admitting: Nurse Practitioner

## 2018-06-18 ENCOUNTER — Other Ambulatory Visit (INDEPENDENT_AMBULATORY_CARE_PROVIDER_SITE_OTHER): Payer: Self-pay | Admitting: Nurse Practitioner

## 2018-06-19 ENCOUNTER — Encounter: Payer: Self-pay | Admitting: *Deleted

## 2018-06-19 ENCOUNTER — Encounter
Admission: RE | Disposition: A | Discharge status: Home / Self Care | Payer: Self-pay | Source: Ambulatory Visit | Attending: Vascular Surgery

## 2018-06-19 ENCOUNTER — Ambulatory Visit
Admission: RE | Admit: 2018-06-19 | Discharge: 2018-06-19 | Disposition: A | Discharge status: Home / Self Care | Payer: Medicare Other | Source: Ambulatory Visit | Attending: Vascular Surgery | Admitting: Vascular Surgery

## 2018-06-19 DIAGNOSIS — Z9889 Other specified postprocedural states: Secondary | ICD-10-CM | POA: Diagnosis not present

## 2018-06-19 DIAGNOSIS — T82590A Other mechanical complication of surgically created arteriovenous fistula, initial encounter: Secondary | ICD-10-CM | POA: Insufficient documentation

## 2018-06-19 DIAGNOSIS — D649 Anemia, unspecified: Secondary | ICD-10-CM | POA: Diagnosis not present

## 2018-06-19 DIAGNOSIS — F329 Major depressive disorder, single episode, unspecified: Secondary | ICD-10-CM | POA: Diagnosis not present

## 2018-06-19 DIAGNOSIS — F411 Generalized anxiety disorder: Secondary | ICD-10-CM | POA: Diagnosis not present

## 2018-06-19 DIAGNOSIS — Z79890 Hormone replacement therapy (postmenopausal): Secondary | ICD-10-CM | POA: Insufficient documentation

## 2018-06-19 DIAGNOSIS — Y832 Surgical operation with anastomosis, bypass or graft as the cause of abnormal reaction of the patient, or of later complication, without mention of misadventure at the time of the procedure: Secondary | ICD-10-CM | POA: Insufficient documentation

## 2018-06-19 DIAGNOSIS — Z79899 Other long term (current) drug therapy: Secondary | ICD-10-CM | POA: Diagnosis not present

## 2018-06-19 DIAGNOSIS — M6281 Muscle weakness (generalized): Secondary | ICD-10-CM | POA: Insufficient documentation

## 2018-06-19 DIAGNOSIS — E785 Hyperlipidemia, unspecified: Secondary | ICD-10-CM | POA: Diagnosis not present

## 2018-06-19 DIAGNOSIS — Z7901 Long term (current) use of anticoagulants: Secondary | ICD-10-CM | POA: Diagnosis not present

## 2018-06-19 DIAGNOSIS — E669 Obesity, unspecified: Secondary | ICD-10-CM | POA: Insufficient documentation

## 2018-06-19 DIAGNOSIS — G4730 Sleep apnea, unspecified: Secondary | ICD-10-CM | POA: Diagnosis not present

## 2018-06-19 DIAGNOSIS — Z7722 Contact with and (suspected) exposure to environmental tobacco smoke (acute) (chronic): Secondary | ICD-10-CM | POA: Diagnosis not present

## 2018-06-19 DIAGNOSIS — R600 Localized edema: Secondary | ICD-10-CM | POA: Insufficient documentation

## 2018-06-19 DIAGNOSIS — I5030 Unspecified diastolic (congestive) heart failure: Secondary | ICD-10-CM | POA: Diagnosis not present

## 2018-06-19 DIAGNOSIS — Z6834 Body mass index (BMI) 34.0-34.9, adult: Secondary | ICD-10-CM | POA: Insufficient documentation

## 2018-06-19 DIAGNOSIS — E039 Hypothyroidism, unspecified: Secondary | ICD-10-CM | POA: Diagnosis not present

## 2018-06-19 DIAGNOSIS — T82898A Other specified complication of vascular prosthetic devices, implants and grafts, initial encounter: Secondary | ICD-10-CM | POA: Diagnosis not present

## 2018-06-19 DIAGNOSIS — N186 End stage renal disease: Secondary | ICD-10-CM | POA: Insufficient documentation

## 2018-06-19 DIAGNOSIS — Z992 Dependence on renal dialysis: Secondary | ICD-10-CM | POA: Diagnosis not present

## 2018-06-19 DIAGNOSIS — Z8249 Family history of ischemic heart disease and other diseases of the circulatory system: Secondary | ICD-10-CM | POA: Diagnosis not present

## 2018-06-19 DIAGNOSIS — E54 Ascorbic acid deficiency: Secondary | ICD-10-CM | POA: Insufficient documentation

## 2018-06-19 DIAGNOSIS — I132 Hypertensive heart and chronic kidney disease with heart failure and with stage 5 chronic kidney disease, or end stage renal disease: Secondary | ICD-10-CM | POA: Diagnosis not present

## 2018-06-19 DIAGNOSIS — Z885 Allergy status to narcotic agent status: Secondary | ICD-10-CM | POA: Diagnosis not present

## 2018-06-19 HISTORY — PX: A/V SHUNTOGRAM: CATH118297

## 2018-06-19 LAB — POTASSIUM (ARMC VASCULAR LAB ONLY): POTASSIUM (ARMC VASCULAR LAB): 4.1 (ref 3.5–5.1)

## 2018-06-19 SURGERY — A/V SHUNTOGRAM
Anesthesia: Moderate Sedation | Laterality: Left

## 2018-06-19 MED ORDER — MIDAZOLAM HCL 2 MG/2ML IJ SOLN
INTRAMUSCULAR | Status: DC | PRN
  Administered 2018-06-19: 1 mg via INTRAVENOUS
  Administered 2018-06-19: 2 mg via INTRAVENOUS

## 2018-06-19 MED ORDER — HEPARIN (PORCINE) IN NACL 1000-0.9 UT/500ML-% IV SOLN
INTRAVENOUS | Status: AC
  Filled 2018-06-19: qty 1000

## 2018-06-19 MED ORDER — FENTANYL CITRATE (PF) 100 MCG/2ML IJ SOLN
INTRAMUSCULAR | Status: AC
  Filled 2018-06-19: qty 2

## 2018-06-19 MED ORDER — SODIUM CHLORIDE 0.9 % IV SOLN: INTRAVENOUS | Status: DC

## 2018-06-19 MED ORDER — HEPARIN SODIUM (PORCINE) 1000 UNIT/ML IJ SOLN
INTRAMUSCULAR | Status: DC | PRN
  Administered 2018-06-19: 3000 [IU] via INTRAVENOUS

## 2018-06-19 MED ORDER — FENTANYL CITRATE (PF) 100 MCG/2ML IJ SOLN
INTRAMUSCULAR | Status: DC | PRN
  Administered 2018-06-19: 25 ug via INTRAVENOUS
  Administered 2018-06-19: 50 ug via INTRAVENOUS

## 2018-06-19 MED ORDER — LIDOCAINE-EPINEPHRINE (PF) 1 %-1:200000 IJ SOLN
INTRAMUSCULAR | Status: AC
  Filled 2018-06-19: qty 30

## 2018-06-19 MED ORDER — HEPARIN SODIUM (PORCINE) 1000 UNIT/ML IJ SOLN
INTRAMUSCULAR | Status: AC
  Filled 2018-06-19: qty 1

## 2018-06-19 MED ORDER — IOPAMIDOL (ISOVUE-300) INJECTION 61%
INTRAVENOUS | Status: DC | PRN
  Administered 2018-06-19: 30 mL via INTRAVENOUS

## 2018-06-19 MED ORDER — ONDANSETRON HCL 4 MG/2ML IJ SOLN: 4.0000 mg | Freq: Four times a day (QID) | INTRAMUSCULAR | Status: DC | PRN

## 2018-06-19 MED ORDER — FENTANYL CITRATE (PF) 100 MCG/2ML IJ SOLN: 12.5000 ug | Freq: Once | INTRAMUSCULAR | Status: DC | PRN

## 2018-06-19 MED ORDER — CEFAZOLIN SODIUM-DEXTROSE 1-4 GM/50ML-% IV SOLN: 1.0000 g | Freq: Once | INTRAVENOUS | Status: DC

## 2018-06-19 MED ORDER — MIDAZOLAM HCL 5 MG/5ML IJ SOLN
INTRAMUSCULAR | Status: AC
  Filled 2018-06-19: qty 5

## 2018-06-19 SURGICAL SUPPLY — 9 items
BALLN ULTRVRSE 10X60X75 (BALLOONS) ×3
BALLOON ULTRVRSE 10X60X75 (BALLOONS) ×1 IMPLANT
CANNULA 5F STIFF (CANNULA) ×3 IMPLANT
DEVICE PRESTO INFLATION (MISCELLANEOUS) ×3 IMPLANT
PACK ANGIOGRAPHY (CUSTOM PROCEDURE TRAY) ×3 IMPLANT
SET INTRO CAPELLA COAXIAL (SET/KITS/TRAYS/PACK) ×3 IMPLANT
SHEATH BRITE TIP 6FRX5.5 (SHEATH) ×3 IMPLANT
SUT MNCRL AB 4-0 PS2 18 (SUTURE) ×3 IMPLANT
WIRE MAGIC TOR.035 180C (WIRE) ×3 IMPLANT

## 2018-06-19 NOTE — Op Note (Signed)
[facility]    OPERATIVE NOTE   PROCEDURE: 1.  Left brachial artery to axillary vein arteriovenous graft cannulation under ultrasound guidance 2.  Left arm shuntogram 3.  Percutaneous transluminal angioplasty of the mid graft and the initial portion of the previously placed stent with 10 mm diameter by 4 cm angioplasty balloon  PRE-OPERATIVE DIAGNOSIS: 1. ESRD 2. 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 Poor wall apposition of the leading edge of the stent in the mid graft with greater than 50% narrowing of the stent.  The remainder of the graft and the stent appeared widely patent without significant stenosis as did the central venous circulation.  SPECIMEN(S):  None  CONTRAST: 30 cc  FLUORO TIME: 0.7 minutes  MODERATE CONSCIOUS SEDATION TIME:  Approximately 20 minutes using 3 mg of Versed and 75 mcg of Fentanyl  INDICATIONS: [name] is a 37 y.o. male who presents with malfunctioning left brachial artery to axillary vein arteriovenous graft.  The patient is scheduled for left arm shuntogram.  The patient is aware the risks include but are not limited to: bleeding, infection, thrombosis of the cannulated access, and possible anaphylactic reaction to the contrast.  The patient is aware of the risks of the procedure and elects to proceed forward.  DESCRIPTION: After full informed written consent was obtained, the patient was brought back to the angiography suite and placed supine upon the angiography table.  The patient was connected to monitoring equipment. Moderate conscious sedation was administered during a face to face encounter throughout the procedure with my supervision of the RN administering medicines and monitoring the patient's vital signs, pulse oximetry, telemetry and  mental status throughout from the start of the procedure until the patient was taken to the recovery room The left arm was prepped and draped in the standard fashion for a percutaneous access intervention.  Under ultrasound guidance, the left brachial artery to axillary vein arteriovenous graft was cannulated with a micropuncture needle under direct ultrasound guidance were it was patent and a permanent image was performed.  The microwire was advanced into the graft and the needle was exchanged for the a microsheath.  I then upsized to a 6 Fr Sheath and imaging was performed.  Hand injections were completed to image the access including the central venous system. This demonstrated poor wall apposition of the leading edge of the stent in the mid graft with greater than 50% narrowing of the stent.  The remainder of the graft and the stent appeared widely patent without significant stenosis as did the central venous circulation.  Based on the images, this patient will need angioplasty to open this poorly opposed stent and reduce the stenosis.  An oversized balloon to ensure wall apposition is planned. I then gave the patient 3000 units of intravenous heparin.  I then crossed the stenosis with a Magic Tourqe wire.  Based on the imaging, a 10 mm x 4 cm  angioplasty balloon was selected.  The balloon was centered around the stenosis and inflated to 10 ATM for 1 minute(s).  On completion imaging, a less than 10 % residual stenosis was present.     Based on the completion imaging, no further intervention is necessary.  The wire and balloon were removed from the sheath.  A 4-0 Monocryl purse-string suture was sewn around  the sheath.  The sheath was removed while tying down the suture.  A sterile bandage was applied to the puncture site.  COMPLICATIONS: None  CONDITION: Stable   Leotis Pain  06/19/2018 1:11 PM    This note was created with Dragon Medical transcription system. Any errors in dictation are purely  unintentional.

## 2018-06-19 NOTE — H&P (Signed)
[facility] History & Physical Update  The patient was interviewed and re-examined.  [name] previous History and Physical has been reviewed and is unchanged.  There is no change in the plan of care. We plan to proceed with the scheduled procedure.  Festus Barren, MD  06/19/2018, 10:57 AM

## 2018-06-20 ENCOUNTER — Ambulatory Visit
Admission: RE | Admit: 2018-06-20 | Discharge: 2018-06-20 | Disposition: A | Discharge status: Home / Self Care | Payer: Medicare Other | Source: Ambulatory Visit | Attending: Neurology | Admitting: Neurology

## 2018-06-20 ENCOUNTER — Encounter: Payer: Self-pay | Admitting: Vascular Surgery

## 2018-06-20 DIAGNOSIS — R404 Transient alteration of awareness: Secondary | ICD-10-CM | POA: Diagnosis not present

## 2018-06-20 DIAGNOSIS — G253 Myoclonus: Secondary | ICD-10-CM

## 2018-06-20 DIAGNOSIS — M4602 Spinal enthesopathy, cervical region: Secondary | ICD-10-CM | POA: Diagnosis not present

## 2018-07-05 IMAGING — DX DG CHEST 1V PORT
1 series · 1 of 1 positions shown · non-contrast
Comparison: Film from earlier in the same day

CLINICAL DATA: Increasing respiratory needs and hypoxia

EXAM:
PORTABLE CHEST 1 VIEW

[chest ap]
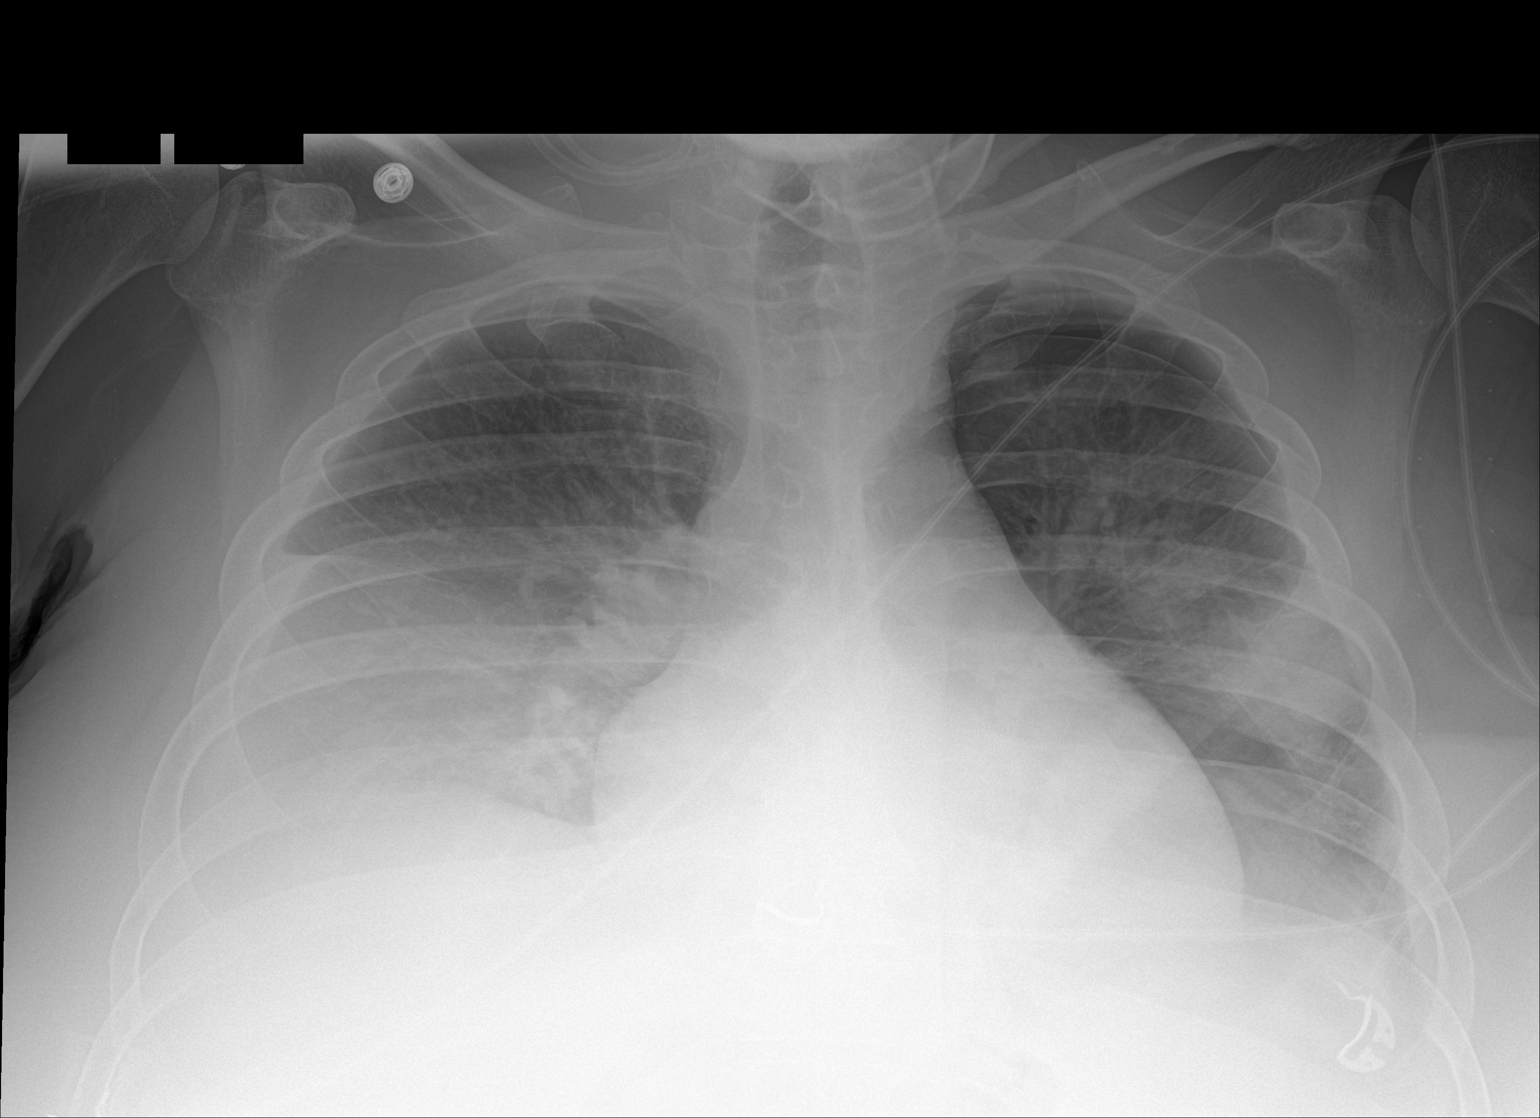

[1 of 1 positions shown; findings below may reference images not displayed]

FINDINGS: Small left pneumothorax is again identified and unchanged given a
change in patient positioning. Again this is felt to be related to
incomplete re-expansion of the left lung. There is now however new
parenchymal density identified in the mid and lower portion of the
left lung consistent with re-expansion edema. Persistent large right
pleural effusion is noted. Cardiac shadow is stable. Bony structures
are stable.
IMPRESSION: Changes of re-expansion edema within the left lung.

Stable nontraumatic left pneumothorax.

## 2018-07-07 IMAGING — DX DG CHEST 1V PORT
1 series · 1 of 1 positions shown · non-contrast
Comparison: 04/03/2017

CLINICAL DATA: Follow-up effusion

EXAM:
PORTABLE CHEST 1 VIEW

[chest ap]
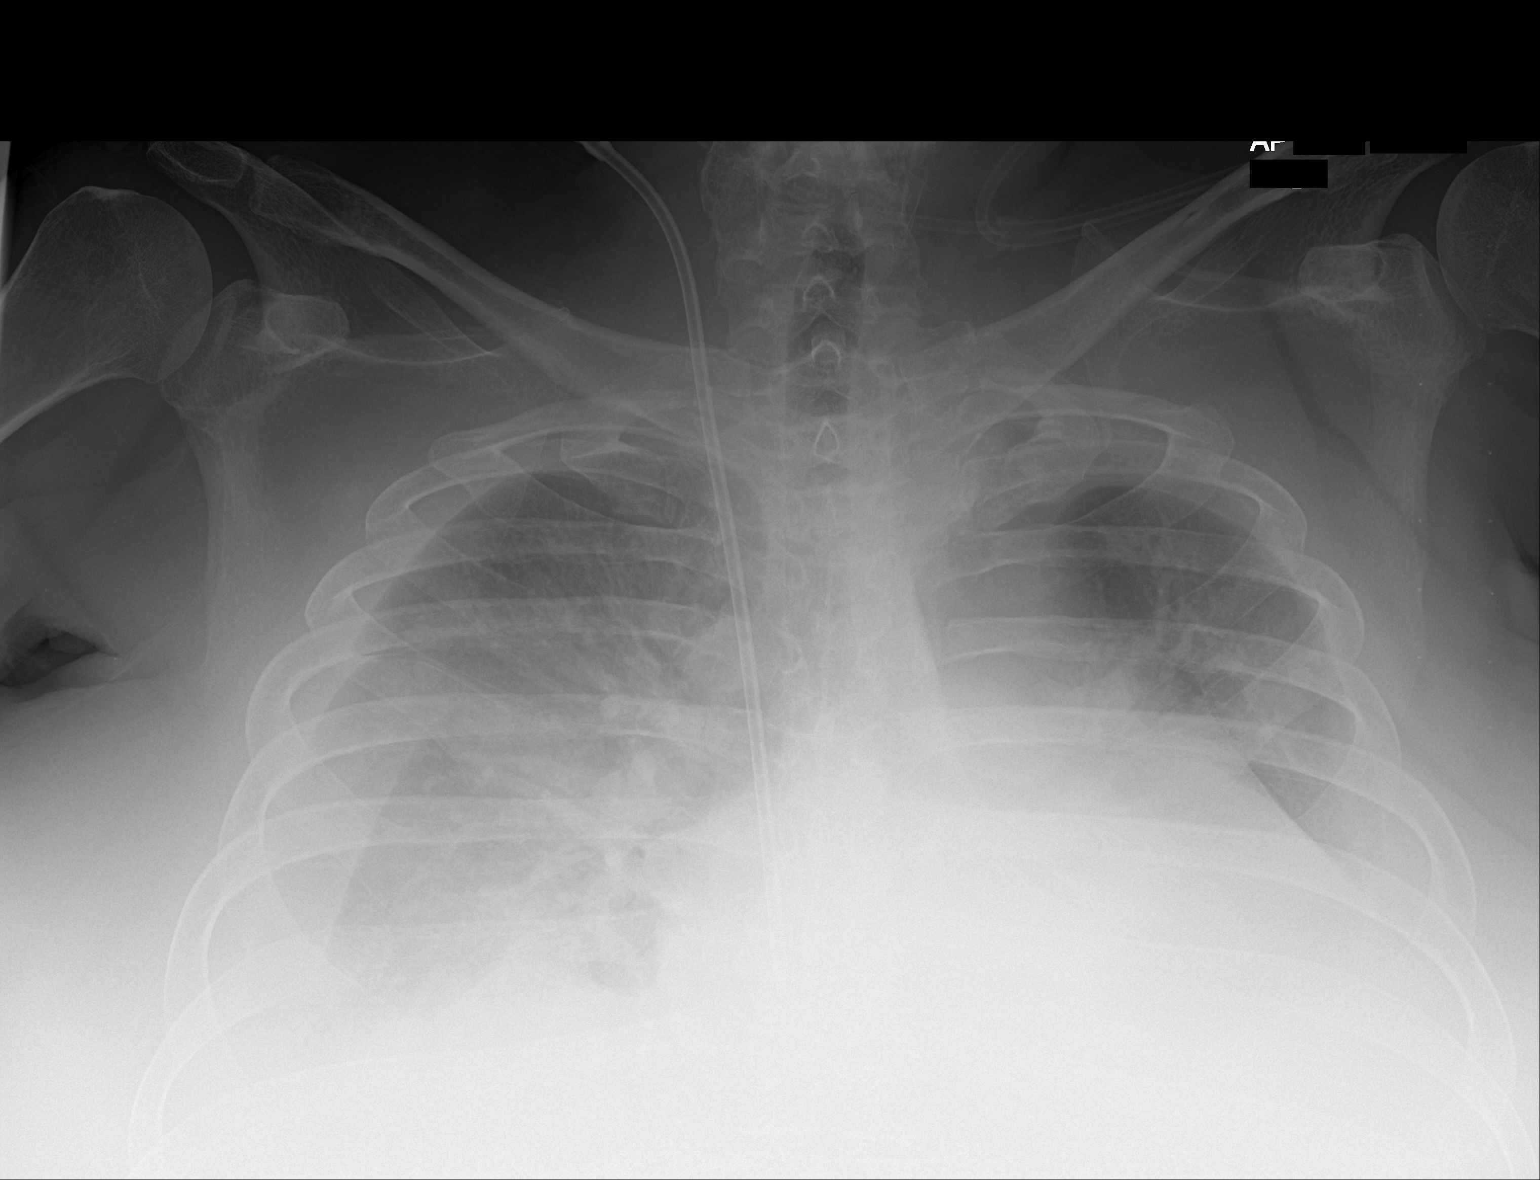

[1 of 1 positions shown; findings below may reference images not displayed]

FINDINGS: Cardiac shadow remains enlarged. Temporary dialysis catheter is
again noted on the right. Large bilateral pleural effusions are
again seen increasing on the left. No pneumothorax component is
identified at this time. Central vascular congestion is noted. No
acute bony abnormality is seen.
IMPRESSION: Bilateral pleural effusions increasing on the left.

## 2018-07-29 ENCOUNTER — Encounter: Payer: Self-pay | Admitting: Emergency Medicine

## 2018-07-29 ENCOUNTER — Emergency Department
Admission: EM | Admit: 2018-07-29 | Discharge: 2018-07-29 | Disposition: A | Discharge status: Home / Self Care | Payer: Medicaid Other | Attending: Emergency Medicine | Admitting: Emergency Medicine

## 2018-07-29 ENCOUNTER — Other Ambulatory Visit: Payer: Self-pay

## 2018-07-29 DIAGNOSIS — H538 Other visual disturbances: Secondary | ICD-10-CM | POA: Insufficient documentation

## 2018-07-29 DIAGNOSIS — I5032 Chronic diastolic (congestive) heart failure: Secondary | ICD-10-CM | POA: Diagnosis not present

## 2018-07-29 DIAGNOSIS — Z79899 Other long term (current) drug therapy: Secondary | ICD-10-CM | POA: Diagnosis not present

## 2018-07-29 DIAGNOSIS — I132 Hypertensive heart and chronic kidney disease with heart failure and with stage 5 chronic kidney disease, or end stage renal disease: Secondary | ICD-10-CM | POA: Insufficient documentation

## 2018-07-29 DIAGNOSIS — N186 End stage renal disease: Secondary | ICD-10-CM | POA: Diagnosis not present

## 2018-07-29 DIAGNOSIS — Z992 Dependence on renal dialysis: Secondary | ICD-10-CM | POA: Diagnosis not present

## 2018-07-29 DIAGNOSIS — E039 Hypothyroidism, unspecified: Secondary | ICD-10-CM | POA: Diagnosis not present

## 2018-07-29 LAB — CBC WITH DIFFERENTIAL/PLATELET
ABS IMMATURE GRANULOCYTES: 0.01 10*3/uL (ref 0.00–0.07)
BASOS PCT: 2 %
Basophils Absolute: 0.1 10*3/uL (ref 0.0–0.1)
Eosinophils Absolute: 0.1 10*3/uL (ref 0.0–0.5)
Eosinophils Relative: 2 %
HCT: 38.9 % — ABNORMAL LOW (ref 39.0–52.0)
Hemoglobin: 12.8 g/dL — ABNORMAL LOW (ref 13.0–17.0)
IMMATURE GRANULOCYTES: 0 %
LYMPHS ABS: 2 10*3/uL (ref 0.7–4.0)
Lymphocytes Relative: 29 %
MCH: 30.8 pg (ref 26.0–34.0)
MCHC: 32.9 g/dL (ref 30.0–36.0)
MCV: 93.7 fL (ref 80.0–100.0)
MONOS PCT: 8 %
Monocytes Absolute: 0.5 10*3/uL (ref 0.1–1.0)
NEUTROS ABS: 4 10*3/uL (ref 1.7–7.7)
NEUTROS PCT: 59 %
NRBC: 0 % (ref 0.0–0.2)
Platelets: 256 10*3/uL (ref 150–400)
RBC: 4.15 MIL/uL — ABNORMAL LOW (ref 4.22–5.81)
RDW: 12.7 % (ref 11.5–15.5)
WBC: 6.7 10*3/uL (ref 4.0–10.5)

## 2018-07-29 LAB — COMPREHENSIVE METABOLIC PANEL
ALT: 27 U/L (ref 0–44)
AST: 22 U/L (ref 15–41)
Albumin: 3.4 g/dL — ABNORMAL LOW (ref 3.5–5.0)
Alkaline Phosphatase: 83 U/L (ref 38–126)
Anion gap: 11 (ref 5–15)
BUN: 65 mg/dL — AB (ref 6–20)
CHLORIDE: 102 mmol/L (ref 98–111)
CO2: 24 mmol/L (ref 22–32)
CREATININE: 9.47 mg/dL — AB (ref 0.61–1.24)
Calcium: 8.4 mg/dL — ABNORMAL LOW (ref 8.9–10.3)
GFR calc Af Amer: 7 mL/min — ABNORMAL LOW (ref >60)
GFR, EST NON AFRICAN AMERICAN: 6 mL/min — AB (ref >60)
GLUCOSE: 79 mg/dL (ref 70–99)
Potassium: 4.8 mmol/L (ref 3.5–5.1)
Sodium: 137 mmol/L (ref 135–145)
Total Bilirubin: 0.6 mg/dL (ref 0.3–1.2)
Total Protein: 7 g/dL (ref 6.5–8.1)

## 2018-07-29 NOTE — ED Triage Notes (Signed)
First Nurse Note:  [facility]

## 2018-07-29 NOTE — Discharge Instructions (Addendum)
Please contact Dr. Sherryll BurgerShah about scheduling the following tests which the ophthalmologist recommended: MRI/MRV of brain Lumbar Puncture with opening pressure Carotid US A1C Please seek medical attention for any high fevers, chest pain, shortness of breath, change in behavior, persistent vomiting, bloody stool or any other new or concerning symptoms.

## 2018-07-29 NOTE — ED Notes (Signed)
PATIENT EVALUATED BY DR. Derrill KayGOODMAN, DISCHARGED TO HOME WITH FOLLOW UP INSTRUCTIONS.

## 2018-07-29 NOTE — ED Triage Notes (Signed)
States weakness and loss of balance x 6 months, blurred vision x 1 year. Dialysis patient, last dialysis 4 days ago.

## 2018-07-29 NOTE — ED Provider Notes (Signed)
[facility] Emergency Department Provider Note  ____________________________________________   I have reviewed the triage vital signs and the nursing notes.   HISTORY  Chief Complaint Blurry Vision  History limited by: Not Limited  HPI [name] is a 37 y.o. male who presents to the emergency department today from ophthalmologists office because of concern for abnormal ocular findings on exam. Patient states that he has been having blurry vision for roughly 1 year. It is intermittent. The patient denies any current blurry vision at the time of my exam. The patient denies any headaches. Does follow up with a neurologist.    Per medical record review patient has a history of CKD. HLD, HTN.   Past Medical History:  Diagnosis Date  . Anemia   . CKD (chronic kidney disease)   . 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 Depression   . Diastolic CHF (HCC)   . Generalized anxiety disorder   . Hyperlipidemia   . Hypertension   . Hypertension   . Hypothyroidism   . Lymph edema   . Lymphedema   . Muscle weakness (generalized)   . Nephrotic syndrome   . Obesity   . Pleural effusion   . Pulmonary hypertension (HCC)   . Respiratory failure with hypoxia (HCC)   . Sleep apnea    CPAP  . Vitamin C deficiency     Patient Active Problem List   Diagnosis Date Noted  . Malfunction of arteriovenous dialysis fistula (HCC) 02/23/2018  . PAD (peripheral artery disease) (HCC) 10/22/2017  . ESRD on dialysis (HCC) 08/22/2017  . Essential hypertension 08/22/2017  . Protein-calorie malnutrition, severe 04/30/2017  . 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 Acute on chronic respiratory failure with hypoxia and hypercapnia (HCC)   . Pleural effusion, bilateral   . Postprocedural pneumothorax   . ARF (acute renal failure) (HCC) 03/30/2017  . Lower extremity edema 06/04/2014    Past Surgical History:  Procedure Laterality Date  . A/V FISTULAGRAM Left  02/27/2018   Procedure: A/V FISTULAGRAM;  Surgeon: Annice Needy, MD;  Location: [facility];  Service: Cardiovascular;  Laterality: Left;  . A/V FISTULAGRAM Left 04/30/2018   Procedure: A/V FISTULAGRAM;  Surgeon: Annice Needy, MD;  Location: [facility];  Service: Cardiovascular;  Laterality: Left;  . A/V SHUNTOGRAM Left 06/19/2018   Procedure: A/V SHUNTOGRAM;  Surgeon: Annice Needy, MD;  Location: [facility];  Service: Cardiovascular;  Laterality: Left;  . AV FISTULA PLACEMENT Left 10/05/2017   Procedure: INSERTION OF ARTERIOVENOUS (AV) GORE-TEX GRAFT ARM ( BRACHIAL AXILLARY );  Surgeon: Renford Dills, MD;  Location: [facility] ORS;  Service: Vascular;  Laterality: Left;  . CARDIAC CATHETERIZATION    . DIALYSIS/PERMA CATHETER INSERTION N/A 04/02/2017   Procedure: DIALYSIS/PERMA CATHETER INSERTION;  Surgeon: Annice Needy, MD;  Location: [facility];  Service: Cardiovascular;  Laterality: N/A;  . DIALYSIS/PERMA CATHETER INSERTION N/A 04/25/2017   Procedure: DIALYSIS/PERMA CATHETER INSERTION;  Surgeon: Annice Needy, MD;  Location: [facility];  Service: Cardiovascular;  Laterality: N/A;  . DIALYSIS/PERMA CATHETER REMOVAL N/A 11/19/2017   Procedure: DIALYSIS/PERMA CATHETER REMOVAL;  Surgeon: Annice Needy, MD;  Location: [facility];  Service: Cardiovascular;  Laterality: N/A;  . UPPER GI ENDOSCOPY      Prior to Admission medications   Medication Sig Start Date End Date Taking? Authorizing Provider  apixaban (ELIQUIS) 5 MG TABS tablet Take  1 tablet (5 mg total) by mouth 2 (two) times daily. 05/02/17   Alford HighlandWieting, Richard, MD  b complex-vitamin c-folic acid (NEPHRO-VITE) 0.8 MG TABS tablet Take 1 tablet by mouth at bedtime.    [provider]  buPROPion (WELLBUTRIN SR) 150 MG 12 hr tablet Take 150 mg by mouth 2 (two) times daily.    [provider]  collagenase (SANTYL) ointment Apply topically.    [provider]  docusate  sodium (COLACE) 100 MG capsule Take 1 capsule (100 mg total) by mouth 2 (two) times daily. Patient not taking: Reported on 06/19/2018 04/13/17   Shaune Pollackhen, Qing, MD  ferrous sulfate 325 (65 FE) MG tablet Take 1 tablet (325 mg total) by mouth daily at 12 noon. Patient taking differently: Take 325 mg by mouth daily.  04/14/17   Shaune Pollackhen, Qing, MD  gabapentin (NEURONTIN) 100 MG capsule Take 100-300 mg by mouth See admin instructions. Take 100 mg by mouth in the evening on Tuesday, Thursday and Saturday. Take 300 mg by mouth in the evening on all other days    [provider]  HYDROcodone-acetaminophen (NORCO) 5-325 MG tablet Take 1-2 tablets by mouth every 6 (six) hours as needed for moderate pain or severe pain. 10/05/17   Schnier, Latina CraverGregory G, MD  levothyroxine (SYNTHROID, LEVOTHROID) 75 MCG tablet Take 75 mcg by mouth daily before breakfast.    [provider]  losartan (COZAAR) 100 MG tablet Take 100 mg by mouth at bedtime.     [provider]  metoprolol tartrate (LOPRESSOR) 25 MG tablet Take 25 mg by mouth 2 (two) times daily.    [provider]  Multiple Vitamin (THERA) TABS Take by mouth.    [provider]  nystatin (MYCOSTATIN/NYSTOP) powder Apply topically.    [provider]  ondansetron (ZOFRAN) 4 MG/2ML SOLN injection Inject 4 mg into the muscle every 6 (six) hours as needed for nausea or vomiting.    [provider]  sertraline (ZOLOFT) 25 MG tablet Take 25 mg by mouth daily.     [provider]  sevelamer carbonate (RENVELA) 800 MG tablet Take 800 mg by mouth 3 (three) times daily with meals.     [provider]  torsemide (DEMADEX) 20 MG tablet Take 2 tablets (40 mg total) by mouth daily. Patient taking differently: Take 40 mg by mouth 4 (four) times a week. Monday, Wednesday, Friday and Sunday 05/02/17   Alford HighlandWieting, Richard, MD  triamcinolone ointment (KENALOG) 0.1 % Apply twice a day to affected areas. 11/28/17   [provider]  vitamin C (ASCORBIC ACID) 500 MG tablet Take by mouth.    [provider]    Allergies Morphine and Morphine and related  Family History  Problem Relation Age of Onset  . Hypertension Mother     Social History Social History   Tobacco Use  . Smoking status: Passive Smoke Exposure - Never Smoker  . Smokeless tobacco: Never Used  Substance Use Topics  . Alcohol use: No  . Drug use: No    Review of Systems Constitutional: No fever/chills Eyes: Intermittent blurry vision ENT: No sore throat. Cardiovascular: Denies chest pain. Respiratory: Denies shortness of breath. Gastrointestinal: No abdominal pain.  No nausea, no vomiting.  No diarrhea.   Genitourinary: Negative for dysuria. Musculoskeletal: Negative for back pain. Skin: Negative for rash. Neurological: Negative for headaches, focal weakness or numbness.  ____________________________________________   PHYSICAL EXAM:  VITAL SIGNS: ED Triage Vitals  Enc Vitals Group  BP 07/29/18 1315 122/74     Pulse Rate 07/29/18 1315 71     Resp 07/29/18 1315 20     Temp 07/29/18 1315 98.3 F (36.8 C)     Temp Source 07/29/18 1315 Oral     SpO2 07/29/18 1315 97 %     Weight 07/29/18 1316 230 lb (104.3 kg)     Height 07/29/18 1316 5\' 10"  (1.778 m)     Head Circumference --      Peak Flow --      Pain Score 07/29/18 1316 0   Constitutional: Alert and oriented.  Eyes: Conjunctivae are normal.  ENT      Head: Normocephalic and atraumatic.      Nose: No congestion/rhinnorhea.      Mouth/Throat: Mucous membranes are moist.      Neck: No stridor. Hematological/Lymphatic/Immunilogical: No cervical lymphadenopathy. Cardiovascular: Normal rate, regular rhythm.  No murmurs, rubs, or gallops.  Respiratory: Normal respiratory effort without tachypnea nor retractions. Breath sounds are clear and equal bilaterally. No wheezes/rales/rhonchi. Gastrointestinal: Soft and non tender. No rebound. No guarding.   Genitourinary: Deferred Musculoskeletal: Normal range of motion in all extremities. No lower extremity edema. Neurologic:  Normal speech and language. No gross focal neurologic deficits are appreciated.  Skin:  Skin is warm, dry and intact. No rash noted. Psychiatric: Mood and affect are normal. Speech and behavior are normal. Patient exhibits appropriate insight and judgment.  ____________________________________________    LABS (pertinent positives/negatives)  None  ____________________________________________   EKG  None  ____________________________________________    RADIOLOGY  None  ____________________________________________   PROCEDURES  Procedures  ____________________________________________   INITIAL IMPRESSION / ASSESSMENT AND PLAN / ED COURSE  Pertinent labs & imaging results that were available during my care of the patient were reviewed by me and considered in my medical decision making (see chart for details).   Patient sent from ophthalmologist office because of concerning ocular exam.  The patient was sent there by his neurologist because of intermittent blurry vision that is been going on for the past year.  The findings were concerns for ischemic retinal change, retinal hemorrhage and papilledema.  I had a discussion with the patient.  I did state that we could admit the patient for him to get his MRI, lumbar puncture, and carotid ultrasound.   Patient however stated that he would not want to be admitted.  I do think it is reasonable for patient to get these tests as an outpatient.  It is unclear that there are any acute changes or findings.  Patient not complaining of any acute or new symptoms.  Patient is already following up with a neurologist who can help arrange outpatient work-up. Did discuss return precautions.  ____________________________________________   FINAL CLINICAL IMPRESSION(S) / ED DIAGNOSES  Final diagnoses:  Blurry vision      Note: This dictation was prepared with Dragon dictation. Any transcriptional errors that result from this process are unintentional     Phineas Semen, MD 07/29/18 1731

## 2018-07-29 NOTE — ED Notes (Signed)
As per patient was sent to ED for several test. Patient reports his doctor wanted him to have a lumbar punture, MRI, labs, and xrays. As per patient reports having blurred vision x 1 year. Awaiting Md eval and plan of care. Currently sitting in bed watching TV, denies headache, nausea, fever, chills or any symptoms at present time.

## 2018-08-16 ENCOUNTER — Ambulatory Visit (INDEPENDENT_AMBULATORY_CARE_PROVIDER_SITE_OTHER): Payer: Medicaid Other | Admitting: Nurse Practitioner

## 2019-07-04 IMAGING — US US PELVIS LIMITED
1 series · 10 of 10 positions shown · non-contrast
Comparison: None.

CLINICAL DATA: Urinary retention.

EXAM:
LIMITED ULTRASOUND OF PELVIS
TECHNIQUE: Limited transabdominal ultrasound examination of the pelvis was
performed to evaluate the urinary bladder.

[Series 1: us pelvis limited · 0.15mm/px · 10 of 10 slices shown]
[im 1/10]
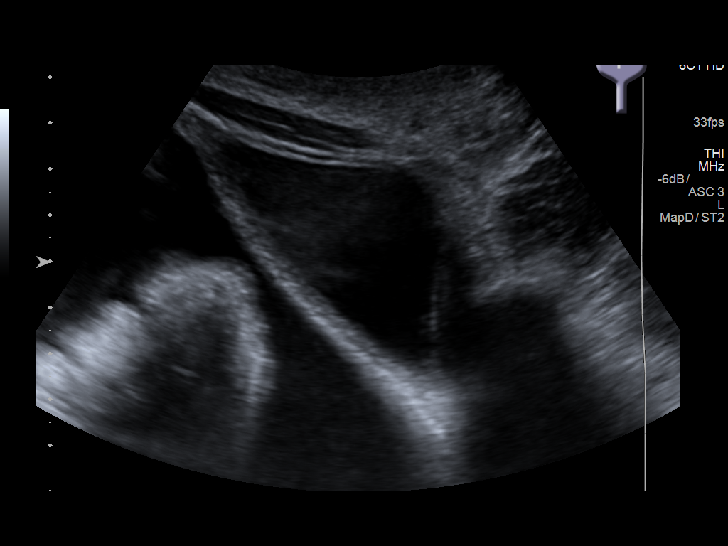
[im 2/10]
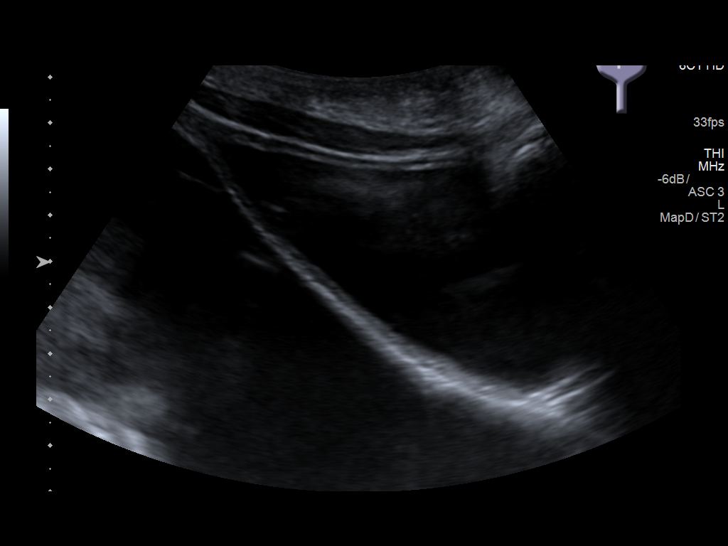
[im 3/10]
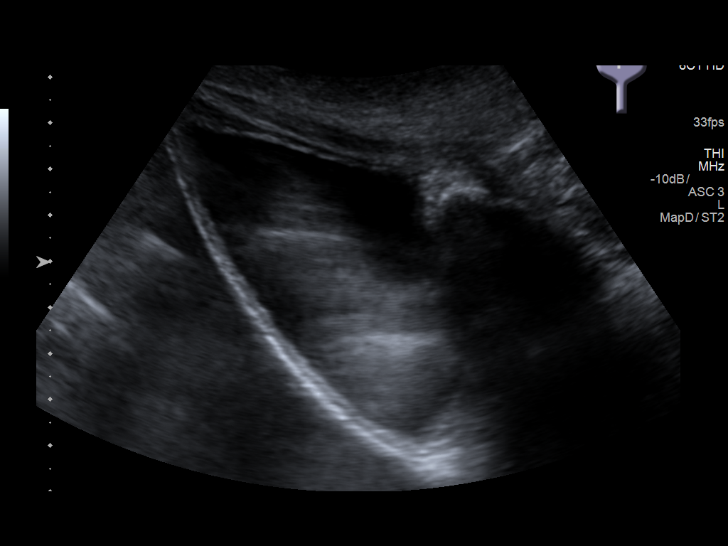
[im 4/10]
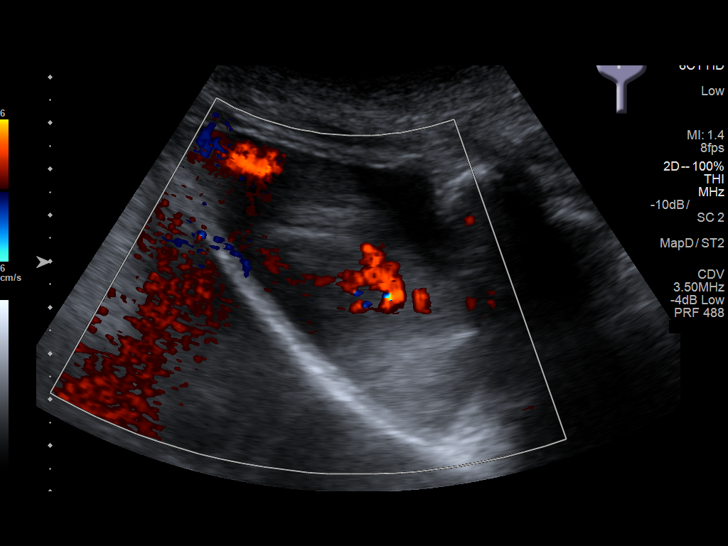
[im 5/10]
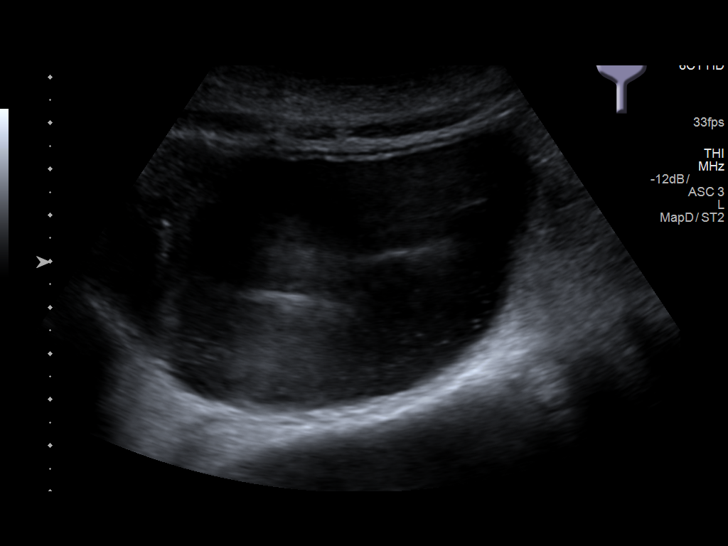
[im 6/10]
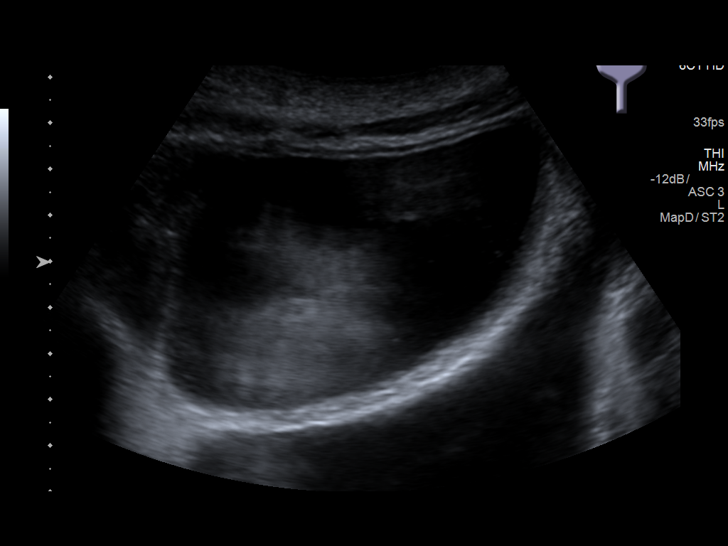
[im 7/10]
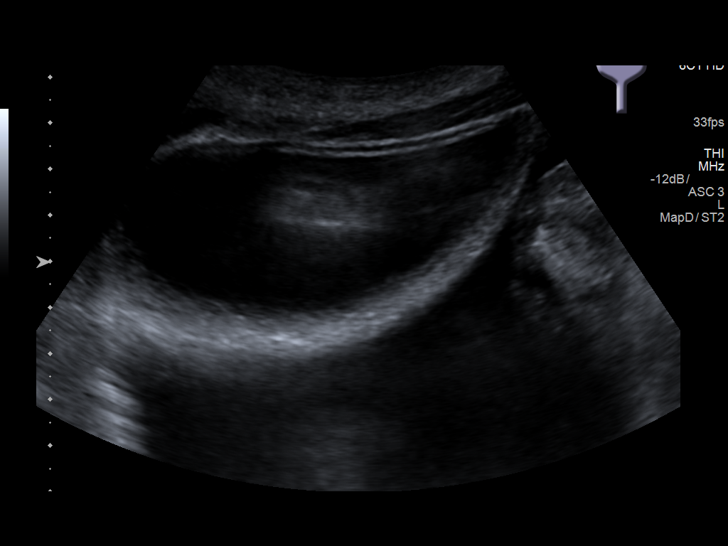
[im 8/10]
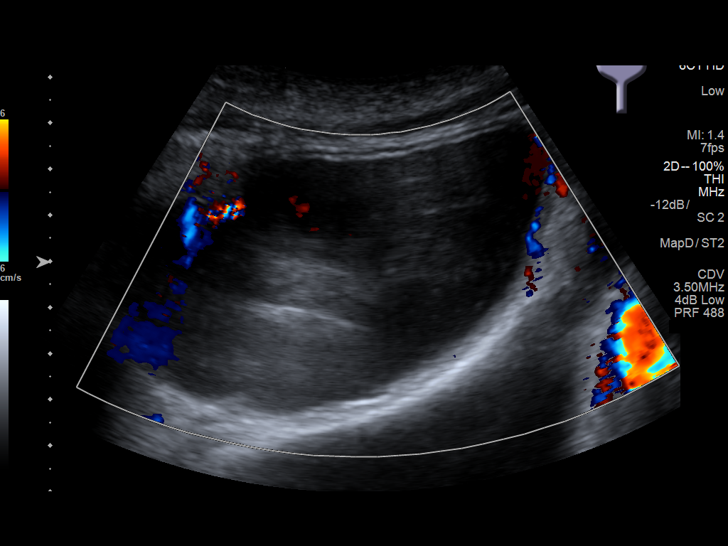
[im 9/10]
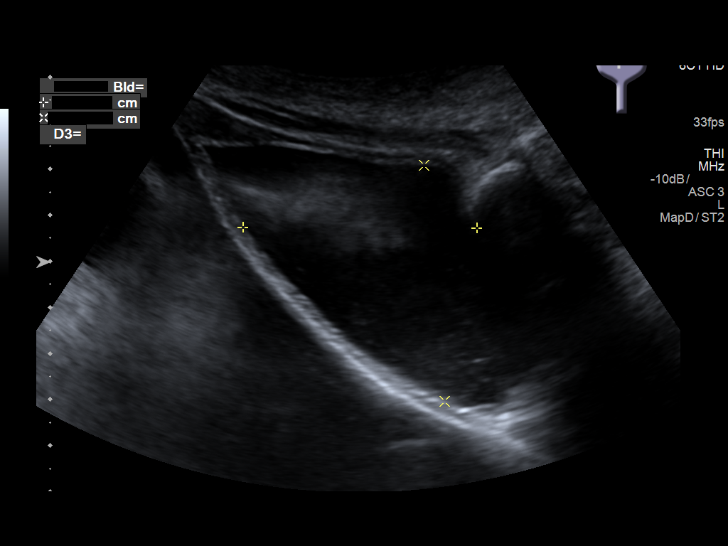
[im 10/10]
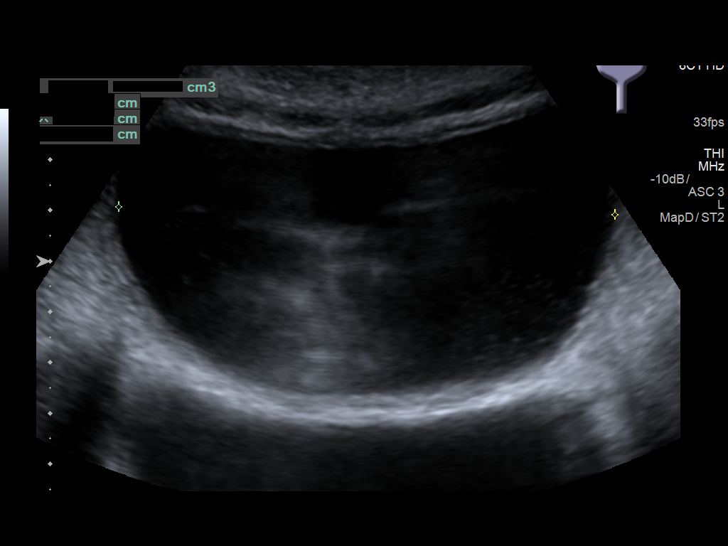

[10 of 10 positions shown; findings below may reference images not displayed]

FINDINGS: Urinary bladder is nondilated, with calculated bladder volume of 134
mL. Poorly defined intraluminal echogenic area is seen, which may be
due to artifact, blood clot, or mass.
IMPRESSION: Nondilated urinary bladder. Intraluminal echogenic area which may be
due to artifact, blood clot, or mass . Recommend follow-up
ultrasound with full urinary bladder, or CT for further evaluation.

## 2019-07-07 IMAGING — US US PARACENTESIS
1 series · 10 of 10 positions shown · non-contrast
Comparison: none

INDICATION: Large volume ascites

[Series 1: us paracentesis · 0.49mm/px · 10 of 10 slices shown]
[im 1/10]
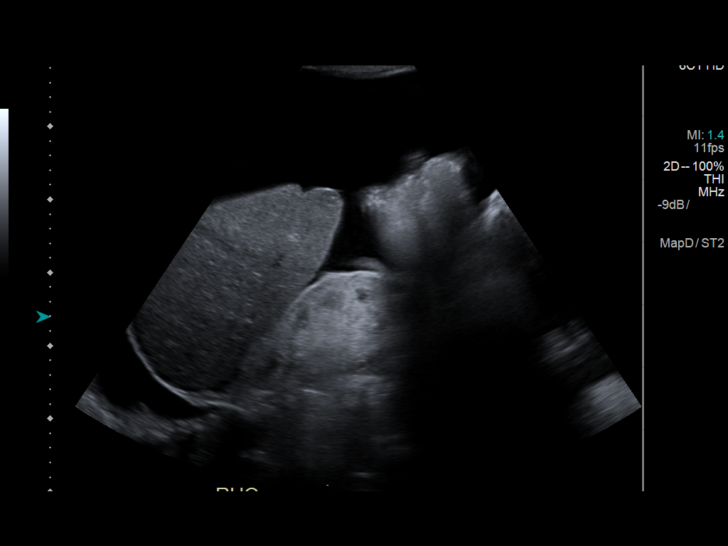
[im 2/10]
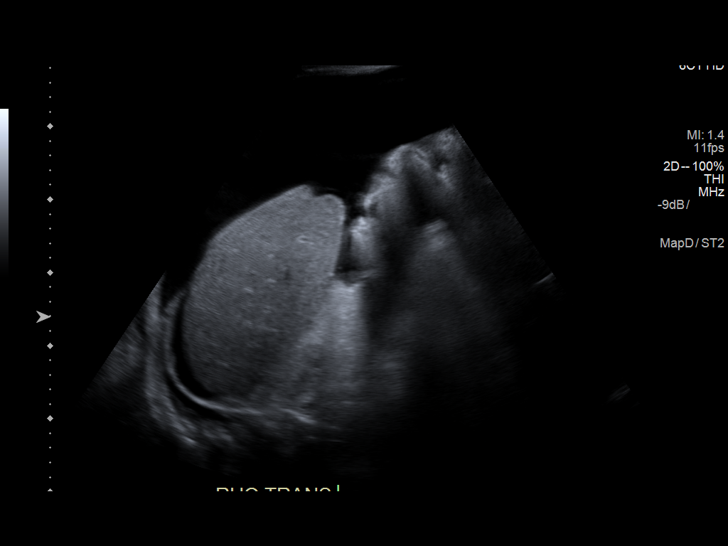
[im 3/10]
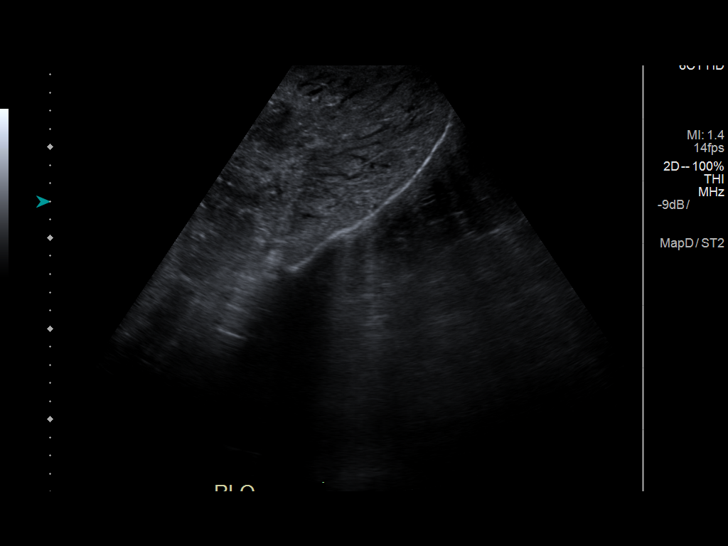
[im 4/10]
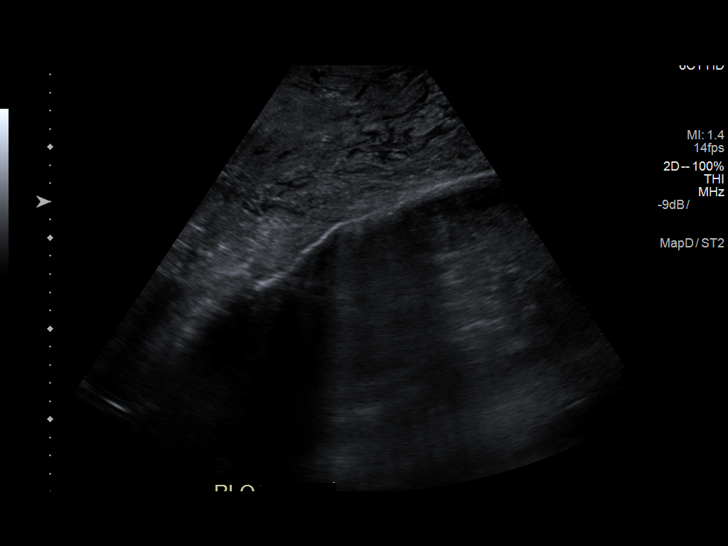
[im 5/10]
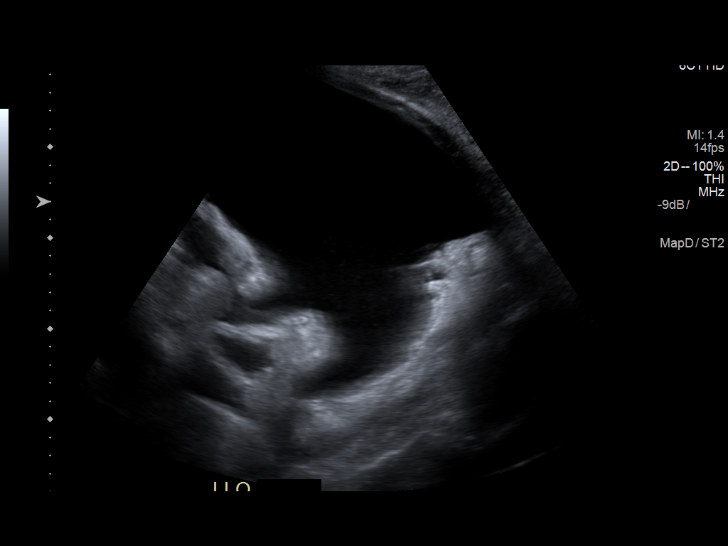
[im 6/10]
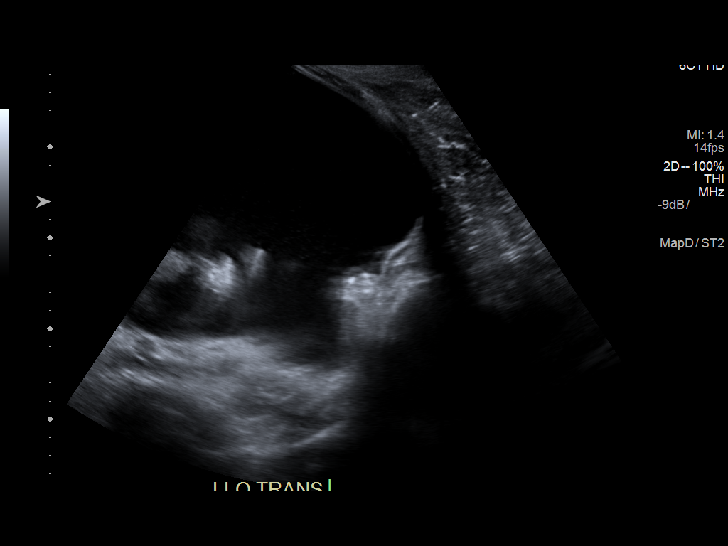
[im 7/10]
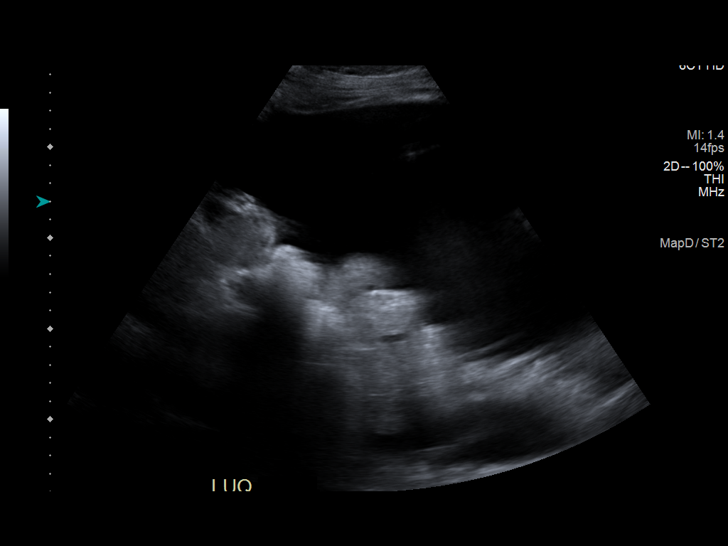
[im 8/10]
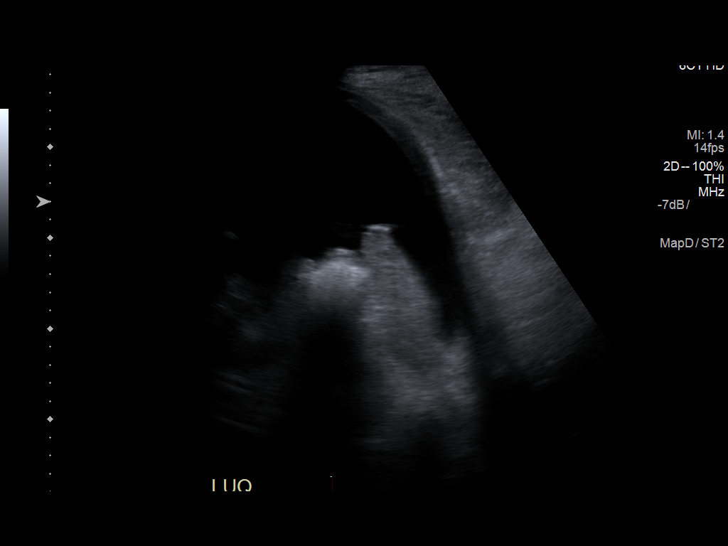
[im 9/10]
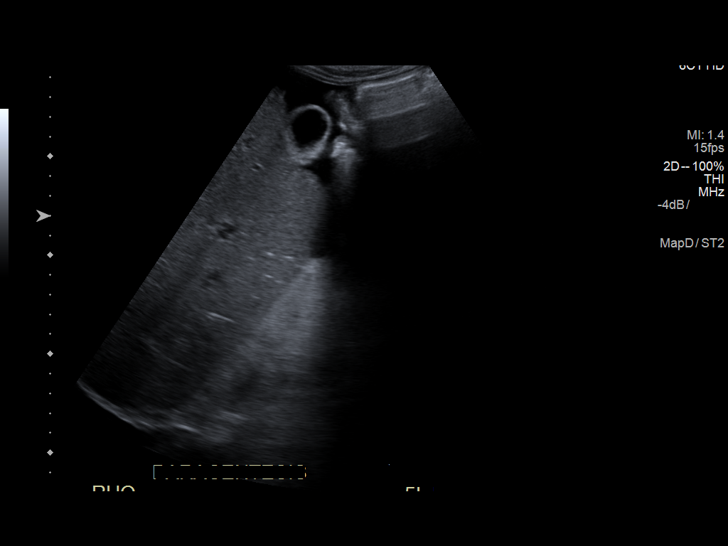
[im 10/10]
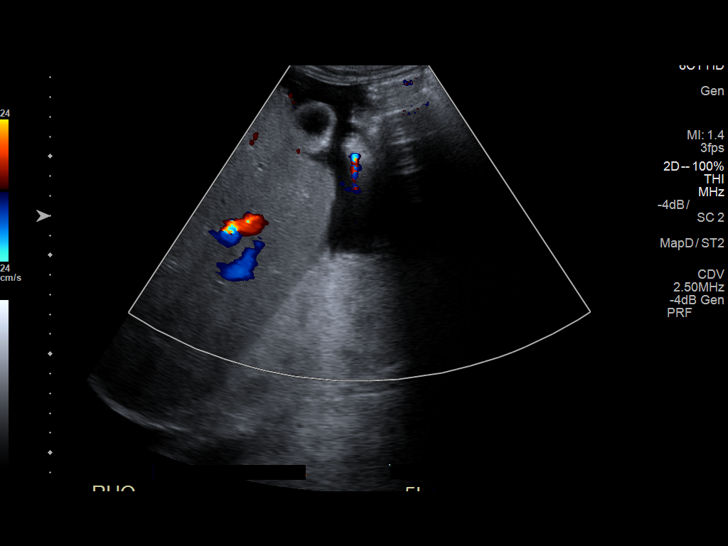

[10 of 10 positions shown; findings below may reference images not displayed]

EXAM:
ULTRASOUND GUIDED  PARACENTESIS

MEDICATIONS:
None.

COMPLICATIONS:
None immediate.

PROCEDURE:
Informed written consent was obtained from the patient after a
discussion of the risks, benefits and alternatives to treatment. A
timeout was performed prior to the initiation of the procedure.

Initial ultrasound scanning demonstrates a large amount of ascites
within the right abdomen. The right abdomen was prepped and draped
in the usual sterile fashion. 1% lidocaine was used for local
anesthesia.

Following this, a 6 Fr Safe-T-Centesis catheter was introduced. An
ultrasound image was saved for documentation purposes. The
paracentesis was performed. The catheter was removed and a dressing
was applied. The patient tolerated the procedure well without
immediate post procedural complication.
FINDINGS: A total of approximately 5 L of clear yellow fluid was removed.
Samples were sent to the laboratory as requested by the clinical
team.
IMPRESSION: Successful ultrasound-guided paracentesis yielding 5 liters of
peritoneal fluid.

## 2021-01-12 DEATH — deceased
